# Patient Record
Sex: Female | Born: 1935
Health system: Southern US, Community
[De-identification: ages and names within clinical notes are randomized; demographics above are authoritative.]

## PROBLEM LIST (undated history)

## (undated) DIAGNOSIS — I10 Essential (primary) hypertension: Secondary | ICD-10-CM

## (undated) DIAGNOSIS — E079 Disorder of thyroid, unspecified: Secondary | ICD-10-CM

## (undated) DIAGNOSIS — E785 Hyperlipidemia, unspecified: Secondary | ICD-10-CM

## (undated) HISTORY — DX: Essential (primary) hypertension: I10

## (undated) HISTORY — DX: Disorder of thyroid, unspecified: E07.9

## (undated) HISTORY — PX: NO PAST SURGERIES: SHX2092

## (undated) HISTORY — DX: Hyperlipidemia, unspecified: E78.5

---

## 2014-11-22 ENCOUNTER — Emergency Department: Payer: Self-pay | Admitting: Emergency Medicine

## 2014-11-22 LAB — COMPREHENSIVE METABOLIC PANEL
Albumin: 3.4 g/dL (ref 3.4–5.0)
Alkaline Phosphatase: 71 U/L (ref 46–116)
Anion Gap: 6 — ABNORMAL LOW (ref 7–16)
BUN: 23 mg/dL — AB (ref 7–18)
Bilirubin,Total: 0.5 mg/dL (ref 0.2–1.0)
Calcium, Total: 8.9 mg/dL (ref 8.5–10.1)
Chloride: 106 mmol/L (ref 98–107)
Co2: 29 mmol/L (ref 21–32)
Creatinine: 1.22 mg/dL (ref 0.60–1.30)
EGFR (African American): 53 — ABNORMAL LOW
GFR CALC NON AF AMER: 44 — AB
GLUCOSE: 96 mg/dL (ref 65–99)
Osmolality: 285 (ref 275–301)
POTASSIUM: 4.2 mmol/L (ref 3.5–5.1)
SGOT(AST): 27 U/L (ref 15–37)
SGPT (ALT): 20 U/L (ref 14–63)
Sodium: 141 mmol/L (ref 136–145)
Total Protein: 7 g/dL (ref 6.4–8.2)

## 2014-11-22 LAB — DIFFERENTIAL
BASOS ABS: 0 10*3/uL (ref 0.0–0.1)
Basophil %: 0.9 %
EOS ABS: 0 10*3/uL (ref 0.0–0.7)
Eosinophil %: 0.9 %
LYMPHS PCT: 32 %
Lymphocyte #: 0.7 10*3/uL — ABNORMAL LOW (ref 1.0–3.6)
MONOS PCT: 7.7 %
Monocyte #: 0.2 x10 3/mm (ref 0.2–0.9)
NEUTROS PCT: 58.5 %
Neutrophil #: 1.4 10*3/uL (ref 1.4–6.5)

## 2014-11-22 LAB — URINALYSIS, COMPLETE
BACTERIA: NONE SEEN
Bilirubin,UR: NEGATIVE
GLUCOSE, UR: NEGATIVE mg/dL (ref 0–75)
Ketone: NEGATIVE
LEUKOCYTE ESTERASE: NEGATIVE
Nitrite: NEGATIVE
Ph: 7 (ref 4.5–8.0)
Protein: NEGATIVE
Specific Gravity: 1.008 (ref 1.003–1.030)
WBC UR: 1 /HPF (ref 0–5)

## 2014-11-22 LAB — CBC
HCT: 35.7 % (ref 35.0–47.0)
HGB: 11.4 g/dL — ABNORMAL LOW (ref 12.0–16.0)
MCH: 28.9 pg (ref 26.0–34.0)
MCHC: 31.9 g/dL — ABNORMAL LOW (ref 32.0–36.0)
MCV: 90 fL (ref 80–100)
Platelet: 126 10*3/uL — ABNORMAL LOW (ref 150–440)
RBC: 3.95 10*6/uL (ref 3.80–5.20)
RDW: 12.9 % (ref 11.5–14.5)
WBC: 2.3 10*3/uL — ABNORMAL LOW (ref 3.6–11.0)

## 2014-11-22 LAB — LIPASE, BLOOD: Lipase: 134 U/L (ref 73–393)

## 2015-01-13 DIAGNOSIS — E039 Hypothyroidism, unspecified: Secondary | ICD-10-CM | POA: Diagnosis not present

## 2015-05-18 ENCOUNTER — Ambulatory Visit (INDEPENDENT_AMBULATORY_CARE_PROVIDER_SITE_OTHER): Payer: Medicare PPO | Admitting: Family Medicine

## 2015-05-18 ENCOUNTER — Encounter: Payer: Self-pay | Admitting: Family Medicine

## 2015-05-18 VITALS — BP 138/72 | HR 82 | Temp 97.4°F | Resp 16 | Ht 62.0 in | Wt 129.8 lb

## 2015-05-18 DIAGNOSIS — E785 Hyperlipidemia, unspecified: Secondary | ICD-10-CM

## 2015-05-18 DIAGNOSIS — I10 Essential (primary) hypertension: Secondary | ICD-10-CM

## 2015-05-18 DIAGNOSIS — E038 Other specified hypothyroidism: Secondary | ICD-10-CM

## 2015-05-18 DIAGNOSIS — N189 Chronic kidney disease, unspecified: Secondary | ICD-10-CM

## 2015-05-18 NOTE — Progress Notes (Signed)
Name: Lori Pacheco   MRN: [id number]    DOB: Jul 28, 1921   Date:05/18/2015       Progress Note  Subjective  Chief Complaint  Chief Complaint  Patient presents with  . Hypertension  . Hypothyroidism  . Hyperlipidemia    Hypertension This is a chronic problem. The current episode started more than 1 year ago. The problem is unchanged. The problem is controlled. Pertinent negatives include no blurred vision, chest pain, headaches, neck pain, orthopnea, palpitations or shortness of breath. There are no associated agents to hypertension. Risk factors for coronary artery disease include dyslipidemia and post-menopausal state. Past treatments include ACE inhibitors and calcium channel blockers. The current treatment provides moderate improvement. There are no compliance problems.  Hypertensive end-organ damage includes a thyroid problem.  Hyperlipidemia This is a chronic problem. The current episode started more than 1 year ago. The problem is uncontrolled. Recent lipid tests were reviewed and are high. Exacerbating diseases include hypothyroidism. Factors aggravating her hyperlipidemia include fatty foods. Pertinent negatives include no chest pain, focal weakness, myalgias or shortness of breath. She is currently on no antihyperlipidemic treatment. The current treatment provides no improvement of lipids. Risk factors for coronary artery disease include dyslipidemia, hypertension, post-menopausal, a sedentary lifestyle and stress.  Thyroid Problem Presents for follow-up visit. Patient reports no anxiety, constipation, diarrhea, palpitations, tremors or weight loss. The symptoms have been stable. Past treatments include levothyroxine. The treatment provided significant relief. Her past medical history is significant for hyperlipidemia.      Past Medical History  Diagnosis Date  . Hyperlipidemia   . Hypertension   . Thyroid disease     History  Substance Use Topics  . Smoking status: Never  Smoker   . Smokeless tobacco: Not on file  . Alcohol Use: No     Current outpatient prescriptions:  .  amLODipine (NORVASC) 5 MG tablet, , Disp: , Rfl:  .  levothyroxine (SYNTHROID, LEVOTHROID) 125 MCG tablet, , Disp: , Rfl:  .  lisinopril (PRINIVIL,ZESTRIL) 20 MG tablet, , Disp: , Rfl:   No Known Allergies  Review of Systems  Constitutional: Negative for fever, chills and weight loss.  HENT: Negative for congestion, hearing loss, sore throat and tinnitus.   Eyes: Negative for blurred vision, double vision and redness.  Respiratory: Negative for cough, hemoptysis and shortness of breath.   Cardiovascular: Negative for chest pain, palpitations, orthopnea, claudication and leg swelling.  Gastrointestinal: Negative for heartburn, nausea, vomiting, diarrhea, constipation and blood in stool.  Genitourinary: Negative for dysuria, urgency, frequency and hematuria.  Musculoskeletal: Positive for joint pain. Negative for myalgias, back pain, falls and neck pain.  Skin: Negative for itching.  Neurological: Negative for dizziness, tingling, tremors, focal weakness, seizures, loss of consciousness, weakness and headaches.  Endo/Heme/Allergies: Does not bruise/bleed easily.  Psychiatric/Behavioral: Negative for depression and substance abuse. The patient is not nervous/anxious and does not have insomnia.      Objective  Filed Vitals:   05/18/15 0909  BP: 138/72  Pulse: 82  Temp: 97.4 F (36.3 C)  TempSrc: Oral  Resp: 16  Height: 5\' 2"  (1.575 m)  Weight: 129 lb 12.8 oz (58.877 kg)  SpO2: 97%     Physical Exam  Constitutional: She is oriented to person, place, and time and well-developed, well-nourished, and in no distress.  HENT:  Head: Normocephalic.  Eyes: EOM are normal. Pupils are equal, round, and reactive to light.  Neck: Normal range of motion. No thyromegaly present.  Cardiovascular: Normal rate, regular  rhythm and normal heart sounds.   No murmur  heard. Pulmonary/Chest: Effort normal and breath sounds normal.  Abdominal: Soft. Bowel sounds are normal.  Musculoskeletal: Normal range of motion. She exhibits no edema.  Neurological: She is alert and oriented to person, place, and time. No cranial nerve deficit. Gait normal.  Skin: Skin is warm and dry. No rash noted.  Psychiatric: Memory and affect normal.      Assessment & Plan  1. Essential hypertension Well-controlled  2. Hyperlipemia Needs lipid panel - Lipid panel  3. Other specified hypothyroidism Needs TSH  4. Chronic kidney disease, unspecified stage Recheck status probably secondary to chronic hypertension and NSAID usage - Comprehensive metabolic panel - TSH

## 2015-05-19 DIAGNOSIS — N184 Chronic kidney disease, stage 4 (severe): Secondary | ICD-10-CM | POA: Insufficient documentation

## 2015-05-19 DIAGNOSIS — E785 Hyperlipidemia, unspecified: Secondary | ICD-10-CM | POA: Insufficient documentation

## 2015-05-19 DIAGNOSIS — E038 Other specified hypothyroidism: Secondary | ICD-10-CM | POA: Insufficient documentation

## 2015-05-19 DIAGNOSIS — I10 Essential (primary) hypertension: Secondary | ICD-10-CM | POA: Insufficient documentation

## 2015-05-19 LAB — COMPREHENSIVE METABOLIC PANEL
A/G RATIO: 1.7 (ref 1.1–2.5)
ALT: 7 IU/L (ref 0–32)
AST: 20 IU/L (ref 0–40)
Albumin: 4.3 g/dL (ref 3.2–4.6)
Alkaline Phosphatase: 76 IU/L (ref 39–117)
BILIRUBIN TOTAL: 0.4 mg/dL (ref 0.0–1.2)
BUN/Creatinine Ratio: 14 (ref 11–26)
BUN: 19 mg/dL (ref 10–36)
CHLORIDE: 102 mmol/L (ref 97–108)
CO2: 24 mmol/L (ref 18–29)
Calcium: 9.3 mg/dL (ref 8.7–10.3)
Creatinine, Ser: 1.37 mg/dL — ABNORMAL HIGH (ref 0.57–1.00)
GFR calc non Af Amer: 34 mL/min/{1.73_m2} — ABNORMAL LOW (ref >59)
GFR, EST AFRICAN AMERICAN: 39 mL/min/{1.73_m2} — AB (ref >59)
Globulin, Total: 2.6 g/dL (ref 1.5–4.5)
Glucose: 110 mg/dL — ABNORMAL HIGH (ref 65–99)
Potassium: 4.6 mmol/L (ref 3.5–5.2)
Sodium: 141 mmol/L (ref 134–144)
Total Protein: 6.9 g/dL (ref 6.0–8.5)

## 2015-05-19 LAB — LIPID PANEL
CHOLESTEROL TOTAL: 259 mg/dL — AB (ref 100–199)
Chol/HDL Ratio: 3.2 ratio units (ref 0.0–4.4)
HDL: 81 mg/dL (ref >39)
LDL CALC: 161 mg/dL — AB (ref 0–99)
Triglycerides: 86 mg/dL (ref 0–149)
VLDL Cholesterol Cal: 17 mg/dL (ref 5–40)

## 2015-05-19 LAB — TSH: TSH: 27.08 u[IU]/mL — AB (ref 0.450–4.500)

## 2015-05-19 NOTE — Patient Instructions (Signed)
Follow up in 4 months 

## 2015-05-25 NOTE — Progress Notes (Signed)
Spoke to daughter [name] to check her mothers medication and called in back

## 2015-05-26 ENCOUNTER — Telehealth: Payer: Self-pay | Admitting: Family Medicine

## 2015-05-26 ENCOUNTER — Telehealth: Payer: Self-pay | Admitting: Emergency Medicine

## 2015-05-26 DIAGNOSIS — E0590 Thyrotoxicosis, unspecified without thyrotoxic crisis or storm: Secondary | ICD-10-CM

## 2015-05-26 NOTE — Telephone Encounter (Signed)
Patient daughter [name] called back. Patient was taking 125 mcg of Synthroid. Per Dr. Rutherford Nail patient is to increase to 150 mcg of Synthroid. [name] notified. Script called to pharmacy

## 2015-05-26 NOTE — Telephone Encounter (Signed)
Patient was returning your call. Stated that you wanted to know the dosage of Levothyroxin due to elevated thyroids. She is currently taking 125mg .

## 2015-06-15 MED ORDER — LEVOTHYROXINE SODIUM 150 MCG PO TABS: 150.0000 ug | ORAL_TABLET | Freq: Every day | ORAL | Status: DC

## 2015-07-12 ENCOUNTER — Emergency Department: Payer: Medicare PPO

## 2015-07-12 ENCOUNTER — Emergency Department
Admission: EM | Admit: 2015-07-12 | Discharge: 2015-07-12 | Disposition: A | Discharge status: Home / Self Care | Payer: Medicare PPO | Attending: Emergency Medicine | Admitting: Emergency Medicine

## 2015-07-12 ENCOUNTER — Other Ambulatory Visit: Payer: Self-pay

## 2015-07-12 ENCOUNTER — Encounter: Payer: Self-pay | Admitting: Emergency Medicine

## 2015-07-12 DIAGNOSIS — R110 Nausea: Secondary | ICD-10-CM | POA: Diagnosis not present

## 2015-07-12 DIAGNOSIS — M25562 Pain in left knee: Secondary | ICD-10-CM | POA: Insufficient documentation

## 2015-07-12 DIAGNOSIS — M25561 Pain in right knee: Secondary | ICD-10-CM | POA: Insufficient documentation

## 2015-07-12 DIAGNOSIS — I10 Essential (primary) hypertension: Secondary | ICD-10-CM | POA: Insufficient documentation

## 2015-07-12 DIAGNOSIS — R109 Unspecified abdominal pain: Secondary | ICD-10-CM | POA: Diagnosis not present

## 2015-07-12 DIAGNOSIS — K5900 Constipation, unspecified: Secondary | ICD-10-CM | POA: Insufficient documentation

## 2015-07-12 DIAGNOSIS — R1011 Right upper quadrant pain: Secondary | ICD-10-CM | POA: Insufficient documentation

## 2015-07-12 DIAGNOSIS — Z79899 Other long term (current) drug therapy: Secondary | ICD-10-CM | POA: Diagnosis not present

## 2015-07-12 DIAGNOSIS — R61 Generalized hyperhidrosis: Secondary | ICD-10-CM | POA: Diagnosis not present

## 2015-07-12 LAB — URINALYSIS COMPLETE WITH MICROSCOPIC (ARMC ONLY)
BILIRUBIN URINE: NEGATIVE
Bacteria, UA: NONE SEEN
GLUCOSE, UA: NEGATIVE mg/dL
LEUKOCYTES UA: NEGATIVE
Nitrite: NEGATIVE
PH: 5 (ref 5.0–8.0)
Protein, ur: NEGATIVE mg/dL
SPECIFIC GRAVITY, URINE: 1.017 (ref 1.005–1.030)

## 2015-07-12 LAB — COMPREHENSIVE METABOLIC PANEL
ALT: 12 U/L — ABNORMAL LOW (ref 14–54)
ANION GAP: 9 (ref 5–15)
AST: 20 U/L (ref 15–41)
Albumin: 4.1 g/dL (ref 3.5–5.0)
Alkaline Phosphatase: 65 U/L (ref 38–126)
BUN: 31 mg/dL — ABNORMAL HIGH (ref 6–20)
CHLORIDE: 104 mmol/L (ref 101–111)
CO2: 26 mmol/L (ref 22–32)
CREATININE: 1.41 mg/dL — AB (ref 0.44–1.00)
Calcium: 9.6 mg/dL (ref 8.9–10.3)
GFR, EST AFRICAN AMERICAN: 36 mL/min — AB (ref >60)
GFR, EST NON AFRICAN AMERICAN: 31 mL/min — AB (ref >60)
Glucose, Bld: 105 mg/dL — ABNORMAL HIGH (ref 65–99)
Potassium: 4.7 mmol/L (ref 3.5–5.1)
SODIUM: 139 mmol/L (ref 135–145)
Total Bilirubin: 0.8 mg/dL (ref 0.3–1.2)
Total Protein: 7.5 g/dL (ref 6.5–8.1)

## 2015-07-12 LAB — LIPASE, BLOOD: LIPASE: 34 U/L (ref 22–51)

## 2015-07-12 LAB — CBC
HCT: 35.6 % (ref 35.0–47.0)
HEMOGLOBIN: 11.6 g/dL — AB (ref 12.0–16.0)
MCH: 28.9 pg (ref 26.0–34.0)
MCHC: 32.5 g/dL (ref 32.0–36.0)
MCV: 89 fL (ref 80.0–100.0)
PLATELETS: 116 10*3/uL — AB (ref 150–440)
RBC: 4 MIL/uL (ref 3.80–5.20)
RDW: 13.5 % (ref 11.5–14.5)
WBC: 4.1 10*3/uL (ref 3.6–11.0)

## 2015-07-12 MED ORDER — IOHEXOL 240 MG/ML SOLN
25.0000 mL | Freq: Once | INTRAMUSCULAR | Status: AC | PRN
  Administered 2015-07-12: 25 mL via ORAL

## 2015-07-12 MED ORDER — IOHEXOL 300 MG/ML  SOLN
75.0000 mL | Freq: Once | INTRAMUSCULAR | Status: AC | PRN
  Administered 2015-07-12: 75 mL via INTRAVENOUS

## 2015-07-12 NOTE — ED Notes (Signed)
Pt presents to the ER from home with complaints of RUQ pain for about a week, pt reports pain gets better when she is in a sitting position. Pt reports some nausea "I feel like I am going to vomit but I have not " pt denies any diarrhea pt talks in complete sentences, no respiratory distress noted.

## 2015-07-12 NOTE — ED Notes (Signed)
Patient transported to CT 

## 2015-07-12 NOTE — ED Notes (Signed)
Patient transported to Ultrasound 

## 2015-07-12 NOTE — ED Notes (Signed)
Patient unable to urinate at this time. Informed her we need a urine sample.

## 2015-07-12 NOTE — ED Provider Notes (Signed)
[facility] Emergency Department Provider Note   ____________________________________________  Time seen: 5:45 PM I have reviewed the triage vital signs and the triage nursing note.  HISTORY  Chief Complaint Abdominal Pain   Historian Patient and her daughter  HPI Lori Pacheco is a 81 y.o. female who is fairly healthy and independent and lives at home, who has reportedly been having right-sided abdominal pain for approximately 3 weeks. Daughter states she has noticed her mom complaining of this pain for about 1 month, but her mother has not wanted to come to the doctor for evaluation. Patient states that she has had some nausea without vomiting. She states she is always had constipation even when she was little girl. It's unclear whether the pain is associated with eating or not. The pain is intermittent and she cannot really describe it as sharp or dull and just calls it "pain. "Pain is currently mild. At times it is moderate.    Past Medical History  Diagnosis Date  . Hyperlipidemia   . Hypertension   . Thyroid disease     Patient Active Problem List   Diagnosis Date Noted  . Essential hypertension 05/19/2015  . Hyperlipemia 05/19/2015  . Other specified hypothyroidism 05/19/2015  . Chronic kidney disease 05/19/2015    Past Surgical History  Procedure Laterality Date  . No past surgeries      Current Outpatient Rx  Name  Route  Sig  Dispense  Refill  . amLODipine (NORVASC) 5 MG tablet               . levothyroxine (SYNTHROID, LEVOTHROID) 150 MCG tablet   Oral   Take 1 tablet (150 mcg total) by mouth daily.   30 tablet   5   . levothyroxine (SYNTHROID, LEVOTHROID) 150 MCG tablet   Oral   Take 150 mcg by mouth daily before breakfast.         . lisinopril (PRINIVIL,ZESTRIL) 20 MG tablet                 Allergies Review of patient's allergies indicates no known allergies.  No family history on file.  Social  History Social History  Substance Use Topics  . Smoking status: Never Smoker   . Smokeless tobacco: None  . Alcohol Use: No    Review of Systems  Constitutional: Negative for fever. Positive for night sweats. Positive for large weight loss over the past several months, unintentional. Eyes: Negative for visual changes. ENT: Negative for sore throat. Cardiovascular: Negative for chest pain. Respiratory: Negative for shortness of breath. Gastrointestinal: Negative for diarrhea. Genitourinary: Negative for dysuria. Musculoskeletal: Negative for back pain. Occasional bilateral knee pains. Skin: Negative for rash. Neurological: Negative for headache. 10 point Review of Systems otherwise negative ____________________________________________   PHYSICAL EXAM:  VITAL SIGNS: ED Triage Vitals  Enc Vitals Group     BP --      Pulse Rate 07/12/15 1527 88     Resp 07/12/15 1527 20     Temp 07/12/15 1527 98.4 F (36.9 C)     Temp Source 07/12/15 1527 Oral     SpO2 07/12/15 1527 100 %     Weight 07/12/15 1527 130 lb (58.968 kg)     Height 07/12/15 1527 5\' 1"  (1.549 m)     Head Cir --      Peak Flow --      Pain Score 07/12/15 1529 4     Pain Loc --  Pain Edu? --      Excl. in [place]? --      Constitutional: Alert and oriented. Well appearing and in no distress. Eyes: Conjunctivae are normal. PERRL. Normal extraocular movements. ENT   Head: Normocephalic and atraumatic.   Nose: No congestion/rhinnorhea.   Mouth/Throat: Mucous membranes are moist. No teeth.   Neck: No stridor. Cardiovascular/Chest: Normal rate, regular rhythm.  No murmurs, rubs, or gallops. Respiratory: Normal respiratory effort without tachypnea nor retractions. Breath sounds are clear and equal bilaterally. No wheezes/rales/rhonchi. Gastrointestinal: Soft. No distention, no guarding, no rebound. No palpable masses. Mild right-sided abdominal tenderness to palpation deeply in the right upper  quadrant.  Genitourinary/rectal:Deferred Musculoskeletal: Nontender with normal range of motion in all extremities. No joint effusions.  No lower extremity tenderness.  No edema. Neurologic:  Normal speech and language. No gross or focal neurologic deficits are appreciated. Skin:  Skin is warm, dry and intact. No rash noted. Psychiatric: Mood and affect are normal. Speech and behavior are normal. Patient exhibits appropriate insight and judgment.  ____________________________________________   EKG I, Lisa Roca, MD, the attending physician have personally viewed and interpreted all ECGs.  86 bpm. Sinus rhythm with first-degree AV block. Left axis deviation. LVH. Nonspecific T wave. ____________________________________________  LABS (pertinent positives/negatives)  Lipase 34 Comprehensive metabolic panel significant for BUN 31 and creatinine 1.41 and LFTs within normal limits. White blood count 4.1, hemoglobin 1.6, platelet count 116 ____________________________________________  RADIOLOGY All Xrays were viewed by me. Imaging interpreted by Radiologist.  Ultrasound right upper quadrant:  Negative for gallstones. Negative exam.  CT abdomen and pelvis with contrast:  IMPRESSION: 1. No acute abnormality in the abdomen/pelvis. 2. Incidental findings of atherosclerosis, small hepatic and right renal cysts.  __________________________________________  PROCEDURES  Procedure(s) performed: None  Critical Care performed: None  ____________________________________________   ED COURSE / ASSESSMENT AND PLAN  CONSULTATIONS: None  Pertinent labs & imaging results that were available during my care of the patient were reviewed by me and considered in my medical decision making (see chart for details).   Although the patient's laboratory evaluation is reassuring, she does have some tenderness on the right side, and I'm more concerned given that she's had significant weight loss  and complaining of night sweats as well. We proceeded with ultrasound and this was negative. We then proceeded with CT scan, and this showed no acute abnormalities. Patient will follow up with her primary care physician.  Patient / Family / Caregiver informed of clinical course, medical decision-making process, and agree with plan.   I discussed return precautions, follow-up instructions, and discharged instructions with patient and/or family.  ___________________________________________   FINAL CLINICAL IMPRESSION(S) / ED DIAGNOSES   Final diagnoses:  RUQ pain       Lisa Roca, MD 07/12/15 2050

## 2015-07-12 NOTE — Discharge Instructions (Signed)
You were evaluated for intermittent right-sided abdominal pain, and no certain cause was found but your exam and evaluation are reassuring. Return to the emergency department for any worsening condition including abdominal pain, vomiting, diarrhea, black or bloody stools, or any other symptoms concerning to you.   Abdominal Pain Many things can cause abdominal pain. Usually, abdominal pain is not caused by a disease and will improve without treatment. It can often be observed and treated at home. Your health care provider will do a physical exam and possibly order blood tests and X-rays to help determine the seriousness of your pain. However, in many cases, more time must pass before a clear cause of the pain can be found. Before that point, your health care provider may not know if you need more testing or further treatment. HOME CARE INSTRUCTIONS  Monitor your abdominal pain for any changes. The following actions may help to alleviate any discomfort you are experiencing:  Only take over-the-counter or prescription medicines as directed by your health care provider.  Do not take laxatives unless directed to do so by your health care provider.  Try a clear liquid diet (broth, tea, or water) as directed by your health care provider. Slowly move to a bland diet as tolerated. SEEK MEDICAL CARE IF:  You have unexplained abdominal pain.  You have abdominal pain associated with nausea or diarrhea.  You have pain when you urinate or have a bowel movement.  You experience abdominal pain that wakes you in the night.  You have abdominal pain that is worsened or improved by eating food.  You have abdominal pain that is worsened with eating fatty foods.  You have a fever. SEEK IMMEDIATE MEDICAL CARE IF:   Your pain does not go away within 2 hours.  You keep throwing up (vomiting).  Your pain is felt only in portions of the abdomen, such as the right side or the left lower portion of the  abdomen.  You pass bloody or black tarry stools. MAKE SURE YOU:  Understand these instructions.   Will watch your condition.   Will get help right away if you are not doing well or get worse.  Document Released: 07/13/2005 Document Revised: 10/08/2013 Document Reviewed: 06/12/2013 [facility] Patient Information 2015 [place], [place]. This information is not intended to replace advice given to you by your health care provider. Make sure you discuss any questions you have with your health care provider.

## 2015-09-17 ENCOUNTER — Ambulatory Visit (INDEPENDENT_AMBULATORY_CARE_PROVIDER_SITE_OTHER): Payer: Medicare PPO | Admitting: Family Medicine

## 2015-09-17 ENCOUNTER — Encounter: Payer: Self-pay | Admitting: Family Medicine

## 2015-09-17 VITALS — BP 146/72 | HR 89 | Temp 98.7°F | Resp 16 | Ht 61.0 in | Wt 125.1 lb

## 2015-09-17 DIAGNOSIS — I10 Essential (primary) hypertension: Secondary | ICD-10-CM

## 2015-09-17 DIAGNOSIS — E039 Hypothyroidism, unspecified: Secondary | ICD-10-CM | POA: Diagnosis not present

## 2015-09-17 DIAGNOSIS — E038 Other specified hypothyroidism: Secondary | ICD-10-CM

## 2015-09-17 DIAGNOSIS — Z23 Encounter for immunization: Secondary | ICD-10-CM | POA: Diagnosis not present

## 2015-09-17 DIAGNOSIS — N189 Chronic kidney disease, unspecified: Secondary | ICD-10-CM | POA: Diagnosis not present

## 2015-09-17 DIAGNOSIS — E0590 Thyrotoxicosis, unspecified without thyrotoxic crisis or storm: Secondary | ICD-10-CM | POA: Diagnosis not present

## 2015-09-18 DIAGNOSIS — E039 Hypothyroidism, unspecified: Secondary | ICD-10-CM | POA: Insufficient documentation

## 2015-09-18 LAB — BASIC METABOLIC PANEL
BUN/Creatinine Ratio: 20 (ref 11–26)
BUN: 22 mg/dL (ref 10–36)
CO2: 24 mmol/L (ref 18–29)
CREATININE: 1.12 mg/dL — AB (ref 0.57–1.00)
Calcium: 9.6 mg/dL (ref 8.7–10.3)
Chloride: 102 mmol/L (ref 97–106)
GFR calc Af Amer: 49 mL/min/{1.73_m2} — ABNORMAL LOW (ref >59)
GFR, EST NON AFRICAN AMERICAN: 43 mL/min/{1.73_m2} — AB (ref >59)
Glucose: 98 mg/dL (ref 65–99)
Potassium: 4.7 mmol/L (ref 3.5–5.2)
SODIUM: 140 mmol/L (ref 136–144)

## 2015-09-18 LAB — TSH: TSH: <0.006 u[IU]/mL — ABNORMAL LOW (ref 0.450–4.500)

## 2015-09-18 NOTE — Progress Notes (Signed)
Name: Lori Pacheco   MRN: [id number]    DOB: 1921/02/28   Date:09/18/2015       Progress Note  Subjective  Chief Complaint  Chief Complaint  Patient presents with  . Hyperlipidemia  . Chronic Kidney Disease  . Hypertension    HPI  Hypertension   Patient presents for follow-up of hypertension. It has been present for over 5 years.  Patient states that there is compliance with medical regimen which consists of lisinopril 20 mg daily and amlodipine 5 mg daily . There is no end organ disease. Cardiac risk factors include hypertension hyperlipidemia and diabetes.  Exercise regimen consist of some walking .  Diet consist of salt restriction  Hyperlipidemia  Patient has a history of hyperlipidemia for over 5 years.  Current medical regimen consist of diet and exercise .  Compliance is intermittent .  Diet and exercise are currently followed intermittently .  Risk factors for cardiovascular disease include hyperlipidemia hypertension .   There have been no side effects from the medication.    Patient presents for follow-up of hypothyroidism  . It has been present for over 5 years years.  Current symptoms consist of levothyroxin 150 g daily . Current medication regimen consist of levothyroxin 150 micrograms daily .   There is good compliance with regimen.   .  Past Medical History  Diagnosis Date  . Hyperlipidemia   . Hypertension   . Thyroid disease     Social History  Substance Use Topics  . Smoking status: Never Smoker   . Smokeless tobacco: Not on file  . Alcohol Use: No     Current outpatient prescriptions:  .  amLODipine (NORVASC) 5 MG tablet, Take 5 mg by mouth daily. , Disp: , Rfl:  .  levothyroxine (SYNTHROID, LEVOTHROID) 150 MCG tablet, Take 1 tablet (150 mcg total) by mouth daily., Disp: 30 tablet, Rfl: 5 .  lisinopril (PRINIVIL,ZESTRIL) 20 MG tablet, Take 20 mg by mouth daily. , Disp: , Rfl:   No Known Allergies  Review of Systems  Constitutional: Negative for  fever, chills and weight loss.  HENT: Negative for congestion, hearing loss, sore throat and tinnitus.   Eyes: Negative for blurred vision, double vision and redness.  Respiratory: Negative for cough, hemoptysis and shortness of breath.   Cardiovascular: Negative for chest pain, palpitations, orthopnea, claudication and leg swelling.  Gastrointestinal: Negative for heartburn, nausea, vomiting, diarrhea, constipation and blood in stool.  Genitourinary: Negative for dysuria, urgency, frequency and hematuria.  Musculoskeletal: Positive for joint pain. Negative for myalgias, back pain, falls and neck pain.  Skin: Negative for itching.  Neurological: Negative for dizziness, tingling, tremors, focal weakness, seizures, loss of consciousness, weakness and headaches.  Endo/Heme/Allergies: Does not bruise/bleed easily.  Psychiatric/Behavioral: Negative for depression and substance abuse. The patient is not nervous/anxious and does not have insomnia.      Objective  Filed Vitals:   09/17/15 0944  BP: 146/72  Pulse: 89  Temp: 98.7 F (37.1 C)  Resp: 16  Height: '5\' 1"'  (1.549 m)  Weight: 125 lb 2 oz (56.756 kg)  SpO2: 99%     Physical Exam  Constitutional: She is oriented to person, place, and time and well-developed, well-nourished, and in no distress.  HENT:  Head: Normocephalic.  Eyes: EOM are normal. Pupils are equal, round, and reactive to light.  Neck: Normal range of motion. No thyromegaly present.  Cardiovascular: Normal rate, regular rhythm and normal heart sounds.   No murmur heard. Pulmonary/Chest: Effort  normal and breath sounds normal.  Abdominal: Soft. Bowel sounds are normal.  Musculoskeletal: Normal range of motion. She exhibits no edema.  Neurological: She is alert and oriented to person, place, and time. No cranial nerve deficit. Gait normal.  Skin: Skin is warm and dry. No rash noted.  Psychiatric: Memory and affect normal.      Assessment & Plan   1. Need for  influenza vaccination Given - Flu vaccine HIGH DOSE PF (Fluzone High dose)  2. CKD (chronic kidney disease), unspecified stage Check met the - Basic Metabolic Panel (BMET)  3. Other specified hypothyroidism Check TSH  4. Hypothyroidism, unspecified hypothyroidism type  - TSH  5.-hypertension  Well-controlled

## 2016-01-18 ENCOUNTER — Ambulatory Visit: Payer: Medicare PPO | Admitting: Family Medicine

## 2016-01-19 ENCOUNTER — Telehealth: Payer: Self-pay | Admitting: Family Medicine

## 2016-01-19 MED ORDER — AMLODIPINE BESYLATE 5 MG PO TABS: 5.0000 mg | ORAL_TABLET | Freq: Every day | ORAL | Status: DC

## 2016-01-19 MED ORDER — LISINOPRIL 20 MG PO TABS: 20.0000 mg | ORAL_TABLET | Freq: Every day | ORAL | Status: DC

## 2016-01-19 NOTE — Telephone Encounter (Signed)
Requesting refill on all medications, please send to [facility]. She does not need refill on her thyroid medications. Please call once done. Thank you

## 2016-01-19 NOTE — Telephone Encounter (Signed)
Refills have been sent to pharmacy and spoke with daughter and informed her that they had been sent.

## 2016-01-20 ENCOUNTER — Other Ambulatory Visit: Payer: Self-pay

## 2016-01-20 MED ORDER — AMLODIPINE BESYLATE 5 MG PO TABS: 5.0000 mg | ORAL_TABLET | Freq: Every day | ORAL | Status: DC

## 2016-01-20 NOTE — Telephone Encounter (Signed)
Last note reviewed; Rx approved (covering for primary)

## 2016-03-28 ENCOUNTER — Encounter: Payer: Self-pay | Admitting: Family Medicine

## 2016-03-28 ENCOUNTER — Ambulatory Visit (INDEPENDENT_AMBULATORY_CARE_PROVIDER_SITE_OTHER): Payer: Medicare Other | Admitting: Family Medicine

## 2016-03-28 VITALS — BP 138/80 | HR 84 | Temp 98.1°F | Resp 18 | Ht 61.0 in | Wt 125.1 lb

## 2016-03-28 DIAGNOSIS — E785 Hyperlipidemia, unspecified: Secondary | ICD-10-CM

## 2016-03-28 DIAGNOSIS — N183 Chronic kidney disease, stage 3 unspecified: Secondary | ICD-10-CM

## 2016-03-28 DIAGNOSIS — I10 Essential (primary) hypertension: Secondary | ICD-10-CM

## 2016-03-28 DIAGNOSIS — E559 Vitamin D deficiency, unspecified: Secondary | ICD-10-CM

## 2016-03-28 DIAGNOSIS — E039 Hypothyroidism, unspecified: Secondary | ICD-10-CM | POA: Diagnosis not present

## 2016-03-29 DIAGNOSIS — E559 Vitamin D deficiency, unspecified: Secondary | ICD-10-CM | POA: Insufficient documentation

## 2016-03-29 LAB — BASIC METABOLIC PANEL
BUN/Creatinine Ratio: 21 (ref 12–28)
BUN: 26 mg/dL (ref 10–36)
CHLORIDE: 101 mmol/L (ref 96–106)
CO2: 25 mmol/L (ref 18–29)
Calcium: 9.6 mg/dL (ref 8.7–10.3)
Creatinine, Ser: 1.23 mg/dL — ABNORMAL HIGH (ref 0.57–1.00)
GFR calc Af Amer: 44 mL/min/{1.73_m2} — ABNORMAL LOW (ref >59)
GFR, EST NON AFRICAN AMERICAN: 38 mL/min/{1.73_m2} — AB (ref >59)
Glucose: 102 mg/dL — ABNORMAL HIGH (ref 65–99)
Potassium: 5.2 mmol/L (ref 3.5–5.2)
Sodium: 140 mmol/L (ref 134–144)

## 2016-03-29 LAB — TSH: TSH: 4.45 u[IU]/mL (ref 0.450–4.500)

## 2016-03-29 LAB — VITAMIN D 25 HYDROXY (VIT D DEFICIENCY, FRACTURES): VIT D 25 HYDROXY: 12.9 ng/mL — AB (ref 30.0–100.0)

## 2016-03-29 MED ORDER — VITAMIN D 50 MCG (2000 UT) PO CAPS: 1.0000 | ORAL_CAPSULE | Freq: Every day | ORAL | Status: DC

## 2016-03-29 NOTE — Progress Notes (Signed)
Date:  03/28/2016   Name:  Lori Pacheco   DOB:  05/12/1921   MRN:  [id number]  PCP:  Lemont Morrisey, MD    Chief Complaint: Medication Refill   History of Present Illness:  This is a 82 y.o. female seen for 6 month f/u. HTN well controlled on lisinopril/amlodipine, hypothyroid on Synthroid, last TSH low, Synthroid dose lowered. eGFR 49 in December, pt not aware of any kidney problems.  Review of Systems:  Review of Systems  Constitutional: Negative for fever and fatigue.  Respiratory: Negative for cough and shortness of breath.   Cardiovascular: Negative for chest pain and leg swelling.  Endocrine: Negative for polyuria.  Genitourinary: Negative for difficulty urinating.  Neurological: Negative for syncope and light-headedness.    Patient Active Problem List   Diagnosis Date Noted  . CKD (chronic kidney disease), stage III 09/18/2015  . Hypothyroidism 09/18/2015  . Essential hypertension 05/19/2015  . Hyperlipemia 05/19/2015    Prior to Admission medications   Medication Sig Start Date End Date Taking? Authorizing Provider  amLODipine (NORVASC) 5 MG tablet Take 1 tablet (5 mg total) by mouth daily. 01/20/16   Melinda P Lada, MD  levothyroxine (SYNTHROID, LEVOTHROID) 150 MCG tablet Take 1 tablet (150 mcg total) by mouth daily. 06/15/15   Lemont Morrisey, MD  lisinopril (PRINIVIL,ZESTRIL) 20 MG tablet Take 1 tablet (20 mg total) by mouth daily. 01/19/16   Lemont Morrisey, MD    No Known Allergies  Past Surgical History  Procedure Laterality Date  . No past surgeries      Social History  Substance Use Topics  . Smoking status: Never Smoker   . Smokeless tobacco: None  . Alcohol Use: No    No family history on file.  Medication list has been reviewed and updated.  Physical Examination: BP 138/80 mmHg  Pulse 84  Temp(Src) 98.1 F (36.7 C)  Resp 18  Ht 5' 1" (1.549 m)  Wt 125 lb 2 oz (56.756 kg)  BMI 23.65 kg/m2  SpO2 97%  Physical Exam  Constitutional: She  appears well-developed and well-nourished.  Cardiovascular: Normal rate, regular rhythm and normal heart sounds.   Pulmonary/Chest: Effort normal and breath sounds normal.  Musculoskeletal: She exhibits no edema.  Neurological: She is alert.  Skin: Skin is warm and dry.  Psychiatric: She has a normal mood and affect. Her behavior is normal.  Nursing note and vitals reviewed.   Assessment and Plan:  1. Essential hypertension Adequate control, continue lisinopril/amlodipine - Basic Metabolic Panel (BMET)  2. Hypothyroidism, unspecified hypothyroidism type Recheck TSH on decreased Synthroid dose - TSH  3. CKD (chronic kidney disease), stage III Avoid NSAIDS - Vitamin D (25 hydroxy)  4. HLD Would not rx given age and lack of CVD  Return in about 6 months (around 09/27/2016).   M. , Jr. MD [facility]  03/29/2016  

## 2016-03-29 NOTE — Addendum Note (Signed)
Addended by: Adline Potter on: 03/29/2016 03:49 PM   Modules accepted: Orders

## 2016-04-16 ENCOUNTER — Encounter: Payer: Self-pay | Admitting: Emergency Medicine

## 2016-04-16 ENCOUNTER — Emergency Department
Admission: EM | Admit: 2016-04-16 | Discharge: 2016-04-16 | Disposition: A | Discharge status: Home / Self Care | Payer: Medicare Other | Attending: Emergency Medicine | Admitting: Emergency Medicine

## 2016-04-16 ENCOUNTER — Emergency Department: Payer: Medicare Other

## 2016-04-16 DIAGNOSIS — N183 Chronic kidney disease, stage 3 (moderate): Secondary | ICD-10-CM | POA: Insufficient documentation

## 2016-04-16 DIAGNOSIS — E039 Hypothyroidism, unspecified: Secondary | ICD-10-CM | POA: Diagnosis not present

## 2016-04-16 DIAGNOSIS — R1084 Generalized abdominal pain: Secondary | ICD-10-CM

## 2016-04-16 DIAGNOSIS — I129 Hypertensive chronic kidney disease with stage 1 through stage 4 chronic kidney disease, or unspecified chronic kidney disease: Secondary | ICD-10-CM | POA: Insufficient documentation

## 2016-04-16 DIAGNOSIS — E785 Hyperlipidemia, unspecified: Secondary | ICD-10-CM | POA: Insufficient documentation

## 2016-04-16 DIAGNOSIS — Z79899 Other long term (current) drug therapy: Secondary | ICD-10-CM | POA: Diagnosis not present

## 2016-04-16 LAB — COMPREHENSIVE METABOLIC PANEL
ALK PHOS: 65 U/L (ref 38–126)
ALT: 12 U/L — AB (ref 14–54)
AST: 20 U/L (ref 15–41)
Albumin: 4.1 g/dL (ref 3.5–5.0)
Anion gap: 6 (ref 5–15)
BILIRUBIN TOTAL: 0.9 mg/dL (ref 0.3–1.2)
BUN: 21 mg/dL — AB (ref 6–20)
CALCIUM: 9.3 mg/dL (ref 8.9–10.3)
CHLORIDE: 106 mmol/L (ref 101–111)
CO2: 28 mmol/L (ref 22–32)
CREATININE: 1.17 mg/dL — AB (ref 0.44–1.00)
GFR, EST AFRICAN AMERICAN: 45 mL/min — AB (ref >60)
GFR, EST NON AFRICAN AMERICAN: 39 mL/min — AB (ref >60)
Glucose, Bld: 97 mg/dL (ref 65–99)
Potassium: 4.3 mmol/L (ref 3.5–5.1)
Sodium: 140 mmol/L (ref 135–145)
Total Protein: 7.4 g/dL (ref 6.5–8.1)

## 2016-04-16 LAB — URINALYSIS COMPLETE WITH MICROSCOPIC (ARMC ONLY)
Bilirubin Urine: NEGATIVE
Glucose, UA: NEGATIVE mg/dL
Ketones, ur: NEGATIVE mg/dL
Nitrite: NEGATIVE
PH: 6 (ref 5.0–8.0)
PROTEIN: NEGATIVE mg/dL
Specific Gravity, Urine: 1.018 (ref 1.005–1.030)

## 2016-04-16 LAB — CBC
HEMATOCRIT: 35.5 % (ref 35.0–47.0)
HEMOGLOBIN: 11.7 g/dL — AB (ref 12.0–16.0)
MCH: 29.8 pg (ref 26.0–34.0)
MCHC: 33 g/dL (ref 32.0–36.0)
MCV: 90.3 fL (ref 80.0–100.0)
Platelets: 138 10*3/uL — ABNORMAL LOW (ref 150–440)
RBC: 3.93 MIL/uL (ref 3.80–5.20)
RDW: 14.3 % (ref 11.5–14.5)
WBC: 2.8 10*3/uL — AB (ref 3.6–11.0)

## 2016-04-16 LAB — LIPASE, BLOOD: LIPASE: 33 U/L (ref 11–51)

## 2016-04-16 LAB — TROPONIN I: Troponin I: <0.03 ng/mL (ref <0.03)

## 2016-04-16 MED ORDER — IOPAMIDOL (ISOVUE-300) INJECTION 61%
75.0000 mL | Freq: Once | INTRAVENOUS | Status: AC | PRN
  Administered 2016-04-16: 75 mL via INTRAVENOUS

## 2016-04-16 MED ORDER — DIATRIZOATE MEGLUMINE & SODIUM 66-10 % PO SOLN
15.0000 mL | Freq: Once | ORAL | Status: AC
  Administered 2016-04-16: 15 mL via ORAL

## 2016-04-16 NOTE — ED Notes (Signed)
CT at bedside to give patient CT contrast to drink. Pt denies any needs and is noted to be in NAD at this time. Will continue to monitor for further patient needs. Pt's family remains at bedside at this time.

## 2016-04-16 NOTE — ED Provider Notes (Signed)
[facility] Emergency Department Provider Note   ____________________________________________  Time seen: Approximately 9:45am I have reviewed the triage vital signs and the triage nursing note.  HISTORY  Chief Complaint Abdominal Pain   Historian Patient and daughters  HPI Lori Pacheco is a 82 y.o. female is here for eval for abdominal discomfort overnight.  She does have this intermittently and it is located usually on the right side of the abdomen.  No fevers, vomiting or diarrhea.  No chest pain or trouble breathing. Currently patient feels much better, possibly some mild nausea or discomfort. Patient seems to notice the discomfort usually at nighttime. No burning in the epigastrium or belching.      Past Medical History  Diagnosis Date  . Hyperlipidemia   . Hypertension   . Thyroid disease     Patient Active Problem List   Diagnosis Date Noted  . Vitamin D deficiency 03/29/2016  . CKD (chronic kidney disease), stage III 09/18/2015  . Hypothyroidism 09/18/2015  . Essential hypertension 05/19/2015  . Hyperlipemia 05/19/2015    Past Surgical History  Procedure Laterality Date  . No past surgeries      Current Outpatient Rx  Name  Route  Sig  Dispense  Refill  . amLODipine (NORVASC) 5 MG tablet   Oral   Take 1 tablet (5 mg total) by mouth daily.   90 tablet   0   . levothyroxine (SYNTHROID, LEVOTHROID) 150 MCG tablet   Oral   Take 1 tablet (150 mcg total) by mouth daily.   30 tablet   5   . lisinopril (PRINIVIL,ZESTRIL) 20 MG tablet   Oral   Take 1 tablet (20 mg total) by mouth daily.   90 tablet   0   . Cholecalciferol (VITAMIN D) 2000 units CAPS   Oral   Take 1 capsule (2,000 Units total) by mouth daily.   30 capsule        Allergies Review of patient's allergies indicates no known allergies.  No family history on file.  Social History Social History  Substance Use Topics  . Smoking status: Never Smoker   .  Smokeless tobacco: None  . Alcohol Use: No    Review of Systems  Constitutional: Negative for fever. Eyes: Negative for visual changes. ENT: Negative for sore throat. Cardiovascular: Negative for chest pain. Respiratory: Negative for shortness of breath. Gastrointestinal: Negative for vomiting and diarrhea. Genitourinary: Negative for dysuria. Musculoskeletal: Negative for back pain. Skin: Negative for rash. Neurological: Negative for headache. 10 point Review of Systems otherwise negative ____________________________________________   PHYSICAL EXAM:  VITAL SIGNS: ED Triage Vitals  Enc Vitals Group     BP 04/16/16 0750 210/100 mmHg     Pulse Rate 04/16/16 0750 69     Resp 04/16/16 0750 20     Temp 04/16/16 0750 98.1 F (36.7 C)     Temp src --      SpO2 04/16/16 0750 99 %     Weight 04/16/16 0750 125 lb (56.7 kg)     Height 04/16/16 0750 5\' 1"  (1.549 m)     Head Cir --      Peak Flow --      Pain Score --      Pain Loc --      Pain Edu? --      Excl. in [place]? --      Constitutional: Alert and oriented. Well appearing and in no distress. HEENT   Head: Normocephalic and atraumatic.  Eyes: Conjunctivae are normal. PERRL. Normal extraocular movements.      Ears:         Nose: No congestion/rhinnorhea.   Mouth/Throat: Mucous membranes are moist.   Neck: No stridor. Cardiovascular/Chest: Normal rate, regular rhythm.  No murmurs, rubs, or gallops. Respiratory: Normal respiratory effort without tachypnea nor retractions. Breath sounds are clear and equal bilaterally. No wheezes/rales/rhonchi. Gastrointestinal: Soft. No distention, no guarding, no rebound. Mild to moderate right-sided tenderness mid abdomen.  Genitourinary/rectal:Deferred Musculoskeletal: Nontender with normal range of motion in all extremities. No joint effusions.  No lower extremity tenderness.  No edema. Neurologic:  Normal speech and language. No gross or focal neurologic deficits are  appreciated. Skin:  Skin is warm, dry and intact. No rash noted. Psychiatric: Mood and affect are normal. Speech and behavior are normal. Patient exhibits appropriate insight and judgment.  ____________________________________________   EKG I, Lisa Roca, MD, the attending physician have personally viewed and interpreted all ECGs.  54 bpm. Normal sinus rhythm. Left axis deviation. Nonspecific T-wave ____________________________________________  LABS (pertinent positives/negatives)  Labs Reviewed  COMPREHENSIVE METABOLIC PANEL - Abnormal; Notable for the following:    BUN 21 (*)    Creatinine, Ser 1.17 (*)    ALT 12 (*)    GFR calc non Af Amer 39 (*)    GFR calc Af Amer 45 (*)    All other components within normal limits  CBC - Abnormal; Notable for the following:    WBC 2.8 (*)    Hemoglobin 11.7 (*)    Platelets 138 (*)    All other components within normal limits  URINALYSIS COMPLETEWITH MICROSCOPIC ([facility] ONLY) - Abnormal; Notable for the following:    Color, Urine YELLOW (*)    APPearance CLEAR (*)    Hgb urine dipstick 2+ (*)    Leukocytes, UA TRACE (*)    Bacteria, UA RARE (*)    Squamous Epithelial / LPF 0-5 (*)    All other components within normal limits  LIPASE, BLOOD  TROPONIN I    ____________________________________________  RADIOLOGY All Xrays were viewed by me. Imaging interpreted by Radiologist.  CT abdomen post with contrast:IMPRESSION: No acute abnormality noted. No change from the prior exam. __________________________________________  PROCEDURES  Procedure(s) performed: None  Critical Care performed: None  ____________________________________________   ED COURSE / ASSESSMENT AND PLAN  Pertinent labs & imaging results that were available during my care of the patient were reviewed by me and considered in my medical decision making (see chart for details).   This patient is overall well-appearing, however given her age and complaint  I do think it's reasonable to consider imaging. When I read my note from seeing her about a year ago the complaint really seems almost identical and workup at that point in time was negative with an ultrasound and a CT scan.  I spoke with the patient and her daughters about whether or not they think things are worse and whether or not they would like to do repeat imaging. It sounds like we will go ahead with repeat CT scan given that something made her decide to come in due to pain last night rather than any of the other night this past several months.  The CT scan of her abdomen is reassuring. Her labs are reassuring. I discussed with family and patient to follow-up with her primary care physician.    CONSULTATIONS: None   Patient / Family / Caregiver informed of clinical course, medical decision-making process, and agree with  plan.   I discussed return precautions, follow-up instructions, and discharged instructions with patient and/or family.   ___________________________________________   FINAL CLINICAL IMPRESSION(S) / ED DIAGNOSES   Final diagnoses:  Generalized abdominal pain              Note: This dictation was prepared with Dragon dictation. Any transcriptional errors that result from this process are unintentional   Lisa Roca, MD 04/16/16 1149

## 2016-04-16 NOTE — ED Notes (Signed)
Report given to Felicia, RN

## 2016-04-16 NOTE — ED Notes (Signed)
CT notified patient is done with contrast dye.

## 2016-04-16 NOTE — Discharge Instructions (Signed)
You were evaluated for night sweats and abdominal pain, and although no certain cause was found, and your exam and evaluation are reassuring in the emergency department today.  Return to the emergency department for any worsening condition including abdominal pain, dizziness or passing out, chest pain, trouble breathing, fever, or any other symptoms concerning to you.   Abdominal Pain, Adult Many things can cause belly (abdominal) pain. Most times, the belly pain is not dangerous. Many cases of belly pain can be watched and treated at home. HOME CARE   Do not take medicines that help you go poop (laxatives) unless told to by your doctor.  Only take medicine as told by your doctor.  Eat or drink as told by your doctor. Your doctor will tell you if you should be on a special diet. GET HELP IF:  You do not know what is causing your belly pain.  You have belly pain while you are sick to your stomach (nauseous) or have runny poop (diarrhea).  You have pain while you pee or poop.  Your belly pain wakes you up at night.  You have belly pain that gets worse or better when you eat.  You have belly pain that gets worse when you eat fatty foods.  You have a fever. GET HELP RIGHT AWAY IF:   The pain does not go away within 2 hours.  You keep throwing up (vomiting).  The pain changes and is only in the right or left part of the belly.  You have bloody or tarry looking poop. MAKE SURE YOU:   Understand these instructions.  Will watch your condition.  Will get help right away if you are not doing well or get worse.   This information is not intended to replace advice given to you by your health care provider. Make sure you discuss any questions you have with your health care provider.   Document Released: 03/21/2008 Document Revised: 10/24/2014 Document Reviewed: 06/12/2013 Elsevier Interactive Patient Education [employer].

## 2016-04-16 NOTE — ED Notes (Signed)
R lower abdominal pain intermittent x 1 month. Denies dysuria or nausea or vomiting. States usually occurs at night.

## 2016-05-20 ENCOUNTER — Other Ambulatory Visit: Payer: Self-pay | Admitting: Family Medicine

## 2016-05-25 ENCOUNTER — Other Ambulatory Visit: Payer: Self-pay

## 2016-05-25 NOTE — Telephone Encounter (Signed)
Patient requesting refill of Lisinopril.

## 2016-05-26 MED ORDER — LISINOPRIL 20 MG PO TABS: 20.0000 mg | ORAL_TABLET | Freq: Every day | ORAL | 0 refills | Status: DC

## 2016-07-30 ENCOUNTER — Other Ambulatory Visit: Payer: Self-pay | Admitting: Family Medicine

## 2016-07-30 DIAGNOSIS — E0590 Thyrotoxicosis, unspecified without thyrotoxic crisis or storm: Secondary | ICD-10-CM

## 2016-08-03 ENCOUNTER — Other Ambulatory Visit: Payer: Self-pay

## 2016-08-03 DIAGNOSIS — E0590 Thyrotoxicosis, unspecified without thyrotoxic crisis or storm: Secondary | ICD-10-CM

## 2016-08-03 NOTE — Telephone Encounter (Signed)
Patient requesting refill of Levothyroxin. Has an follow up on September 27, 2016 with Dr. Ancil Boozer.

## 2016-08-03 NOTE — Telephone Encounter (Signed)
Patient has appointment with Dr Ancil Boozer for December. They are requesting a refill on Levothyroxine.

## 2016-08-04 MED ORDER — LEVOTHYROXINE SODIUM 150 MCG PO TABS: 150.0000 ug | ORAL_TABLET | Freq: Every day | ORAL | 1 refills | Status: DC

## 2016-08-22 ENCOUNTER — Ambulatory Visit (INDEPENDENT_AMBULATORY_CARE_PROVIDER_SITE_OTHER): Payer: Medicare Other

## 2016-08-22 DIAGNOSIS — Z23 Encounter for immunization: Secondary | ICD-10-CM | POA: Diagnosis not present

## 2016-09-27 ENCOUNTER — Encounter: Payer: Self-pay | Admitting: Family Medicine

## 2016-09-27 ENCOUNTER — Ambulatory Visit (INDEPENDENT_AMBULATORY_CARE_PROVIDER_SITE_OTHER): Payer: Medicare Other | Admitting: Family Medicine

## 2016-09-27 VITALS — BP 146/92 | HR 85 | Temp 98.1°F | Resp 16 | Ht 61.0 in | Wt 128.5 lb

## 2016-09-27 DIAGNOSIS — Z23 Encounter for immunization: Secondary | ICD-10-CM | POA: Diagnosis not present

## 2016-09-27 DIAGNOSIS — E559 Vitamin D deficiency, unspecified: Secondary | ICD-10-CM | POA: Diagnosis not present

## 2016-09-27 DIAGNOSIS — I10 Essential (primary) hypertension: Secondary | ICD-10-CM | POA: Diagnosis not present

## 2016-09-27 DIAGNOSIS — E0590 Thyrotoxicosis, unspecified without thyrotoxic crisis or storm: Secondary | ICD-10-CM

## 2016-09-27 DIAGNOSIS — D696 Thrombocytopenia, unspecified: Secondary | ICD-10-CM

## 2016-09-27 DIAGNOSIS — E7800 Pure hypercholesterolemia, unspecified: Secondary | ICD-10-CM | POA: Diagnosis not present

## 2016-09-27 DIAGNOSIS — E039 Hypothyroidism, unspecified: Secondary | ICD-10-CM

## 2016-09-27 DIAGNOSIS — K5909 Other constipation: Secondary | ICD-10-CM

## 2016-09-27 DIAGNOSIS — N183 Chronic kidney disease, stage 3 unspecified: Secondary | ICD-10-CM

## 2016-09-27 DIAGNOSIS — D708 Other neutropenia: Secondary | ICD-10-CM

## 2016-09-27 LAB — CBC WITH DIFFERENTIAL/PLATELET
BASOS ABS: 0 {cells}/uL (ref 0–200)
BASOS PCT: 0 %
EOS ABS: 0 {cells}/uL — AB (ref 15–500)
Eosinophils Relative: 0 %
HCT: 36.7 % (ref 35.0–45.0)
HEMOGLOBIN: 11.6 g/dL — AB (ref 11.7–15.5)
LYMPHS ABS: 918 {cells}/uL (ref 850–3900)
Lymphocytes Relative: 34 %
MCH: 29.3 pg (ref 27.0–33.0)
MCHC: 31.6 g/dL — ABNORMAL LOW (ref 32.0–36.0)
MCV: 92.7 fL (ref 80.0–100.0)
MPV: 10.5 fL (ref 7.5–12.5)
Monocytes Absolute: 189 cells/uL — ABNORMAL LOW (ref 200–950)
Monocytes Relative: 7 %
NEUTROS ABS: 1593 {cells}/uL (ref 1500–7800)
Neutrophils Relative %: 59 %
Platelets: 158 10*3/uL (ref 140–400)
RBC: 3.96 MIL/uL (ref 3.80–5.10)
RDW: 14.4 % (ref 11.0–15.0)
WBC: 2.7 10*3/uL — ABNORMAL LOW (ref 3.8–10.8)

## 2016-09-27 LAB — TSH: TSH: 2.31 mIU/L

## 2016-09-27 MED ORDER — LEVOTHYROXINE SODIUM 150 MCG PO TABS: 150.0000 ug | ORAL_TABLET | Freq: Every day | ORAL | 0 refills | Status: DC

## 2016-09-27 MED ORDER — LISINOPRIL 20 MG PO TABS: 20.0000 mg | ORAL_TABLET | Freq: Every day | ORAL | 1 refills | Status: DC

## 2016-09-27 MED ORDER — VITAMIN D (ERGOCALCIFEROL) 1.25 MG (50000 UNIT) PO CAPS: 50000.0000 [IU] | ORAL_CAPSULE | ORAL | 1 refills | Status: DC

## 2016-09-27 MED ORDER — AMLODIPINE BESYLATE 5 MG PO TABS: 5.0000 mg | ORAL_TABLET | Freq: Every day | ORAL | 1 refills | Status: DC

## 2016-09-27 NOTE — Progress Notes (Signed)
Name: Lori Pacheco   MRN: [id number]    DOB: 1920-11-05   Date:09/27/2016       Progress Note  Subjective  Chief Complaint  Chief Complaint  Patient presents with  . Medication Refill    6 month F/U  . Hypertension    Edema occasionally, trouble with her toenail  . Hypothyroidism    Constipated    HPI  HTN: she is taking medication most days of the week, no chest pain or palpitation, no dizziness. She is not checking bp at home. Last bp in our office was at goal.   Hypothyroidism: she skips medications, not sure how many times a week. Discussed pill dispensing boxes and importance of returning in 6 weeks if she starts to take it daily. No dry skin, she has chronic constipation.   Chronic constipation: she takes milk of magnesia and dulcolax at times, she does not want to change her regiment at this time. She denies blood in stools or abdominal pain  Toe pain: she clipped her nails last night and it was a little sore but no pain at this time  Hyperlipidemia: she has never took medication, she is 82 yo, she is not on aspirin  Thrombocytopenia, and leukopenia: we will recheck labs. She denies easy bruise   CKI and vitamin D deficiency: she denies pruritus, normal urine volume   Patient Active Problem List   Diagnosis Date Noted  . Vitamin D deficiency 03/29/2016  . CKD (chronic kidney disease), stage III 09/18/2015  . Hypothyroidism 09/18/2015  . Essential hypertension 05/19/2015  . Hyperlipemia 05/19/2015    Past Surgical History:  Procedure Laterality Date  . NO PAST SURGERIES      No family history on file.  Social History   Social History  . Marital status: Married    Spouse name: N/A  . Number of children: N/A  . Years of education: N/A   Occupational History  . Not on file.   Social History Main Topics  . Smoking status: Never Smoker  . Smokeless tobacco: Never Used  . Alcohol use No  . Drug use: No  . Sexual activity: No   Other Topics Concern   . Not on file   Social History Narrative  . No narrative on file     Current Outpatient Prescriptions:  .  amLODipine (NORVASC) 5 MG tablet, Take 1 tablet (5 mg total) by mouth daily., Disp: 90 tablet, Rfl: 0 .  levothyroxine (SYNTHROID, LEVOTHROID) 150 MCG tablet, Take 1 tablet (150 mcg total) by mouth daily., Disp: 30 tablet, Rfl: 1 .  lisinopril (PRINIVIL,ZESTRIL) 20 MG tablet, Take 1 tablet (20 mg total) by mouth daily., Disp: 90 tablet, Rfl: 0 .  Vitamin D, Ergocalciferol, (DRISDOL) 50000 units CAPS capsule, Take 1 capsule (50,000 Units total) by mouth every 7 (seven) days., Disp: 12 capsule, Rfl: 1  No Known Allergies   ROS  Constitutional: Negative for fever or weight change.  Respiratory: Negative for cough and shortness of breath.   Cardiovascular: Negative for chest pain or palpitations.  Gastrointestinal: Negative for abdominal pain, no bowel changes.  Musculoskeletal: Negative for gait problem or joint swelling.  Skin: Negative for rash.  Neurological: Negative for dizziness or headache.  No other specific complaints in a complete review of systems (except as listed in HPI above).   Objective  Vitals:   09/27/16 0830  BP: (!) 146/92  Pulse: 85  Resp: 16  Temp: 98.1 F (36.7 C)  TempSrc: Oral  SpO2: 98%  Weight: 128 lb 8 oz (58.3 kg)  Height: 5\' 1"  (1.549 m)    Body mass index is 24.28 kg/m.  Physical Exam  Constitutional: Patient appears well-developed and well-nourished.  No distress.  HEENT: head atraumatic, normocephalic, pupils equal and reactive to light,  neck supple, throat within normal limits Cardiovascular: Normal rate, regular rhythm and normal heart sounds.  Systolic ejection murmur heard 2nd right intercostal space 2/6 blowing. No BLE edema. Pulmonary/Chest: Effort normal and breath sounds normal. No respiratory distress. Abdominal: Soft.  There is no tenderness. Psychiatric: Patient has a normal mood and affect. behavior is normal.  Judgment and thought content normal. Skin: thick toenails, no pain, no ingrown toe nail  PHQ2/9: Depression screen [facility] 2/9 09/27/2016 03/28/2016 09/17/2015  Decreased Interest 0 0 0  Down, Depressed, Hopeless 0 0 0  PHQ - 2 Score 0 0 0     Fall Risk: Fall Risk  09/27/2016 03/28/2016 09/17/2015 05/18/2015  Falls in the past year? No Yes No Yes  Number falls in past yr: - 2 or more - 2 or more  Injury with Fall? - No - Yes  Risk for fall due to : - - - History of fall(s);Impaired balance/gait  Follow up - Falls evaluation completed - -      Functional Status Survey: Is the patient deaf or have difficulty hearing?: No Does the patient have difficulty seeing, even when wearing glasses/contacts?: No Does the patient have difficulty concentrating, remembering, or making decisions?: No Does the patient have difficulty walking or climbing stairs?: No Does the patient have difficulty dressing or bathing?: No Does the patient have difficulty doing errands alone such as visiting a doctor's office or shopping?: Yes (Does not drive)    Assessment & Plan  1. Hypothyroidism, unspecified type  - TSH  2. Vitamin D deficiency  - Vitamin D, Ergocalciferol, (DRISDOL) 50000 units CAPS capsule; Take 1 capsule (50,000 Units total) by mouth every 7 (seven) days.  Dispense: 12 capsule; Refill: 1  3. CKD (chronic kidney disease), stage III  - COMPLETE METABOLIC PANEL WITH GFR  4. Essential hypertension  - COMPLETE METABOLIC PANEL WITH GFR  5. Hypercholesteremia  Not on medication   6. Need for pneumococcal vaccination   Pneumococcal conjugate vaccine 13-valent IM  7. Thrombocytopenia (HCC)  - CBC with Differential/Platelet  8. Other neutropenia (HCC)  - CBC with Differential/Platelet  9. Chronic constipation  She wants to continue otc medication

## 2016-09-28 LAB — COMPLETE METABOLIC PANEL WITH GFR
ALBUMIN: 4 g/dL (ref 3.6–5.1)
ALK PHOS: 56 U/L (ref 33–130)
ALT: 10 U/L (ref 6–29)
AST: 17 U/L (ref 10–35)
BILIRUBIN TOTAL: 0.6 mg/dL (ref 0.2–1.2)
BUN: 21 mg/dL (ref 7–25)
CO2: 26 mmol/L (ref 20–31)
Calcium: 9.4 mg/dL (ref 8.6–10.4)
Chloride: 105 mmol/L (ref 98–110)
Creat: 1.13 mg/dL — ABNORMAL HIGH (ref 0.60–0.88)
GFR, EST AFRICAN AMERICAN: 48 mL/min — AB (ref >=60)
GFR, EST NON AFRICAN AMERICAN: 42 mL/min — AB (ref >=60)
GLUCOSE: 96 mg/dL (ref 65–99)
Potassium: 4.7 mmol/L (ref 3.5–5.3)
SODIUM: 140 mmol/L (ref 135–146)
TOTAL PROTEIN: 6.8 g/dL (ref 6.1–8.1)

## 2016-11-02 ENCOUNTER — Ambulatory Visit: Payer: Medicare Other | Admitting: Family Medicine

## 2016-11-10 ENCOUNTER — Encounter: Payer: Self-pay | Admitting: Family Medicine

## 2016-11-10 ENCOUNTER — Ambulatory Visit (INDEPENDENT_AMBULATORY_CARE_PROVIDER_SITE_OTHER): Payer: Medicare Other | Admitting: Family Medicine

## 2016-11-10 VITALS — BP 142/86 | HR 77 | Temp 98.1°F | Resp 16 | Ht 61.0 in | Wt 127.2 lb

## 2016-11-10 DIAGNOSIS — I700 Atherosclerosis of aorta: Secondary | ICD-10-CM | POA: Diagnosis not present

## 2016-11-10 DIAGNOSIS — E039 Hypothyroidism, unspecified: Secondary | ICD-10-CM | POA: Diagnosis not present

## 2016-11-10 DIAGNOSIS — N183 Chronic kidney disease, stage 3 unspecified: Secondary | ICD-10-CM

## 2016-11-10 DIAGNOSIS — D708 Other neutropenia: Secondary | ICD-10-CM | POA: Diagnosis not present

## 2016-11-10 DIAGNOSIS — I10 Essential (primary) hypertension: Secondary | ICD-10-CM

## 2016-11-10 DIAGNOSIS — R1010 Upper abdominal pain, unspecified: Secondary | ICD-10-CM | POA: Diagnosis not present

## 2016-11-10 DIAGNOSIS — J302 Other seasonal allergic rhinitis: Secondary | ICD-10-CM | POA: Diagnosis not present

## 2016-11-10 DIAGNOSIS — J3089 Other allergic rhinitis: Secondary | ICD-10-CM

## 2016-11-10 DIAGNOSIS — R252 Cramp and spasm: Secondary | ICD-10-CM

## 2016-11-10 LAB — CBC WITH DIFFERENTIAL/PLATELET
Basophils Absolute: 27 cells/uL (ref 0–200)
Basophils Relative: 1 %
EOS PCT: 0 %
Eosinophils Absolute: 0 cells/uL — ABNORMAL LOW (ref 15–500)
HCT: 41.6 % (ref 35.0–45.0)
HEMOGLOBIN: 12.9 g/dL (ref 11.7–15.5)
LYMPHS ABS: 945 {cells}/uL (ref 850–3900)
Lymphocytes Relative: 35 %
MCH: 28.8 pg (ref 27.0–33.0)
MCHC: 31 g/dL — AB (ref 32.0–36.0)
MCV: 92.9 fL (ref 80.0–100.0)
MPV: 11 fL (ref 7.5–12.5)
Monocytes Absolute: 189 cells/uL — ABNORMAL LOW (ref 200–950)
Monocytes Relative: 7 %
NEUTROS ABS: 1539 {cells}/uL (ref 1500–7800)
Neutrophils Relative %: 57 %
Platelets: 153 10*3/uL (ref 140–400)
RBC: 4.48 MIL/uL (ref 3.80–5.10)
RDW: 14.2 % (ref 11.0–15.0)
WBC: 2.7 10*3/uL — AB (ref 3.8–10.8)

## 2016-11-10 LAB — TSH: TSH: 115.08 mIU/L — ABNORMAL HIGH

## 2016-11-10 MED ORDER — OLOPATADINE HCL 0.2 % OP SOLN: 1.0000 [drp] | Freq: Every day | OPHTHALMIC | 2 refills | Status: DC

## 2016-11-10 MED ORDER — ASPIRIN EC 81 MG PO TBEC: 81.0000 mg | DELAYED_RELEASE_TABLET | Freq: Every day | ORAL | 0 refills | Status: DC

## 2016-11-10 MED ORDER — LORATADINE 10 MG PO TABS: 10.0000 mg | ORAL_TABLET | Freq: Every day | ORAL | 11 refills | Status: DC

## 2016-11-10 NOTE — Progress Notes (Signed)
Name: Lori Pacheco   MRN: [id number]    DOB: 20-Jan-1921   Date:11/10/2016       Progress Note  Subjective  Chief Complaint  Chief Complaint  Patient presents with  . Abdominal Pain    right side for a while, decreased apetite, chills, night sweats  . Eye Drainage    eyes are runny sometimes  . Leg Pain    HPI  HTN: she is taking medication most days of the week, no chest pain or palpitation, she has occasionally dizziness when she gets up quickly from the bed. She is not checking bp at home. Advised to take half dose of medication daily, instead of taking it whenever she feels like  Hyperlipidemia: she has never took medication, she is 83 yo, she is not on aspirin, but willing to take it since she has atherosclerosis of aorta  Leukopenia: we will recheck labs. She denies easy bruise   CKI and vitamin D deficiency: she denies pruritus, normal urine volume  Abdominal pain: going on for the past 2.5 years, had multiple trips to [facility], negative [place] and CT abdomen, it is usually in the mornings, seems to be either right side of abdomen, cramping like and resolves by itself or sometimes with tylenol or Advil prn. Not associated with nausea or vomiting or change in appetite. No blood in stools.   Leg Cramps: she has noticed leg cramps " for a long time ", usually at night, taking mustard and seems to have helped.   AR: she has a long history of AR, she is always outside, she has intermittent sneezing, rhinorrhea and her eyes are watery and itchy at times.    Patient Active Problem List   Diagnosis Date Noted  . Atherosclerosis of abdominal aorta ([place]) 11/10/2016  . Other neutropenia ([place]) 11/10/2016  . Perennial allergic rhinitis with seasonal variation 11/10/2016  . Vitamin D deficiency 03/29/2016  . CKD (chronic kidney disease), stage III 09/18/2015  . Hypothyroidism 09/18/2015  . Essential hypertension 05/19/2015  . Hyperlipemia 05/19/2015    Past Surgical History:   Procedure Laterality Date  . NO PAST SURGERIES      Family History  Problem Relation Age of Onset  . Heart disease Brother   . Cancer Daughter   . Diabetes Brother   . Alcohol abuse Brother     Social History   Social History  . Marital status: Married    Spouse name: N/A  . Number of children: N/A  . Years of education: N/A   Occupational History  . Not on file.   Social History Main Topics  . Smoking status: Never Smoker  . Smokeless tobacco: Former Systems developer    Types: Snuff    Quit date: 09/27/1986  . Alcohol use No  . Drug use: No  . Sexual activity: No   Other Topics Concern  . Not on file   Social History Narrative   Lives with one daughter, still rakes her yard, cuts wood, feeds her chicken, cleans her house and cooks.      Current Outpatient Prescriptions:  .  amLODipine (NORVASC) 5 MG tablet, Take 1 tablet (5 mg total) by mouth daily., Disp: 90 tablet, Rfl: 1 .  aspirin EC 81 MG tablet, Take 1 tablet (81 mg total) by mouth daily., Disp: 30 tablet, Rfl: 0 .  levothyroxine (SYNTHROID, LEVOTHROID) 150 MCG tablet, Take 1 tablet (150 mcg total) by mouth daily., Disp: 90 tablet, Rfl: 0 .  lisinopril (PRINIVIL,ZESTRIL) 20 MG  tablet, Take 1 tablet (20 mg total) by mouth daily., Disp: 90 tablet, Rfl: 1 .  loratadine (CLARITIN) 10 MG tablet, Take 1 tablet (10 mg total) by mouth daily., Disp: 30 tablet, Rfl: 11 .  Olopatadine HCl 0.2 % SOLN, Apply 1 drop to eye daily., Disp: 2.5 mL, Rfl: 2 .  Vitamin D, Ergocalciferol, (DRISDOL) 50000 units CAPS capsule, Take 1 capsule (50,000 Units total) by mouth every 7 (seven) days., Disp: 12 capsule, Rfl: 1  No Known Allergies   ROS  Constitutional: Negative for fever or weight change.  Respiratory: Negative for cough and shortness of breath.   Cardiovascular: Negative for chest pain or palpitations.  Gastrointestinal: Positive for intermittent abdominal pain, no bowel changes ( always constipated - taking mild of magnesia prn  )  Musculoskeletal: Negative for gait problem or joint swelling.  Skin: Negative for rash.  Neurological: Positive  For intermittent  Dizziness ( when she jumps out of bed ) or headache.  No other specific complaints in a complete review of systems (except as listed in HPI above).  Objective  Vitals:   11/10/16 1010  BP: (!) 142/86  Pulse: 77  Resp: 16  Temp: 98.1 F (36.7 C)  SpO2: 98%  Weight: 127 lb 3 oz (57.7 kg)  Height: '5\' 1"'  (1.549 m)    Body mass index is 24.03 kg/m.  Physical Exam  Constitutional: Patient appears well-developed and well-nourished. No distress.  HEENT: head atraumatic, normocephalic, pupils equal and reactive to light, neck supple, throat within normal limits Cardiovascular: Normal rate, regular rhythm and normal heart sounds.  No murmur heard. No BLE edema. Pulmonary/Chest: Effort normal and breath sounds normal. No respiratory distress. Abdominal: Soft.  There is no tenderness. Psychiatric: Patient has a normal mood and affect. behavior is normal. Judgment and thought content normal. Muscular Skeletal: no back pain, negative straight leg raise  Recent Results (from the past 2160 hour(s))  CBC with Differential/Platelet     Status: Abnormal   Collection Time: 09/27/16  9:15 AM  Result Value Ref Range   WBC 2.7 (L) 3.8 - 10.8 K/uL   RBC 3.96 3.80 - 5.10 MIL/uL   Hemoglobin 11.6 (L) 11.7 - 15.5 g/dL   HCT 36.7 35.0 - 45.0 %   MCV 92.7 80.0 - 100.0 fL   MCH 29.3 27.0 - 33.0 pg   MCHC 31.6 (L) 32.0 - 36.0 g/dL   RDW 14.4 11.0 - 15.0 %   Platelets 158 140 - 400 K/uL   MPV 10.5 7.5 - 12.5 fL   Neutro Abs 1,593 1,500 - 7,800 cells/uL   Lymphs Abs 918 850 - 3,900 cells/uL   Monocytes Absolute 189 (L) 200 - 950 cells/uL   Eosinophils Absolute 0 (L) 15 - 500 cells/uL   Basophils Absolute 0 0 - 200 cells/uL   Neutrophils Relative % 59 %   Lymphocytes Relative 34 %   Monocytes Relative 7 %   Eosinophils Relative 0 %   Basophils Relative 0 %    Smear Review Criteria for review not met   COMPLETE METABOLIC PANEL WITH GFR     Status: Abnormal   Collection Time: 09/27/16  9:15 AM  Result Value Ref Range   Sodium 140 135 - 146 mmol/L   Potassium 4.7 3.5 - 5.3 mmol/L   Chloride 105 98 - 110 mmol/L   CO2 26 20 - 31 mmol/L   Glucose, Bld 96 65 - 99 mg/dL   BUN 21 7 - 25 mg/dL  Creat 1.13 (H) 0.60 - 0.88 mg/dL    Comment:   For patients > or = 81 years of age: The upper reference limit for Creatinine is approximately 13% higher for people identified as African-American.      Total Bilirubin 0.6 0.2 - 1.2 mg/dL   Alkaline Phosphatase 56 33 - 130 U/L   AST 17 10 - 35 U/L   ALT 10 6 - 29 U/L   Total Protein 6.8 6.1 - 8.1 g/dL   Albumin 4.0 3.6 - 5.1 g/dL   Calcium 9.4 8.6 - 10.4 mg/dL   GFR, Est African American 48 (L) >=60 mL/min   GFR, Est Non African American 42 (L) >=60 mL/min  TSH     Status: None   Collection Time: 09/27/16  9:15 AM  Result Value Ref Range   TSH 2.31 mIU/L    Comment:   Reference Range   > or = 20 Years  0.40-4.50   Pregnancy Range First trimester  0.26-2.66 Second trimester 0.55-2.73 Third trimester  0.43-2.91         PHQ2/9: Depression screen [facility] 2/9 11/10/2016 09/27/2016 03/28/2016 09/17/2015  Decreased Interest 0 0 0 0  Down, Depressed, Hopeless 0 0 0 0  PHQ - 2 Score 0 0 0 0    Fall Risk: Fall Risk  11/10/2016 09/27/2016 03/28/2016 09/17/2015 05/18/2015  Falls in the past year? No No Yes No Yes  Number falls in past yr: - - 2 or more - 2 or more  Injury with Fall? - - No - Yes  Risk for fall due to : - - - - History of fall(s);Impaired balance/gait  Follow up - - Falls evaluation completed - -     Functional Status Survey: Is the patient deaf or have difficulty hearing?: No Does the patient have difficulty seeing, even when wearing glasses/contacts?: No Does the patient have difficulty concentrating, remembering, or making decisions?: No Does the patient have difficulty walking or  climbing stairs?: No Does the patient have difficulty dressing or bathing?: No Does the patient have difficulty doing errands alone such as visiting a doctor's office or shopping?: Yes   Assessment & Plan  1. Leg cramps  Stretch legs before bed  - VITAMIN D 25 Hydroxy (Vit-D Deficiency, Fractures) - Magnesium - BASIC METABOLIC PANEL WITH GFR  2. CKD (chronic kidney disease), stage III  - BASIC METABOLIC PANEL WITH GFR  3. Other neutropenia (HCC)  - CBC with Differential/Platelet   4. Pain of upper abdomen  It has been chronic and recurrent for the past 2 years, seen by [facility] many times, negative [place], and CT - gave her reassurance, it may be muscular since she is active and chops wood with right arm  5. Atherosclerosis of abdominal aorta (HCC)  Discussed aspirin , keep bp at goal, she wants to hold off on statin therapy   6. Perennial allergic rhinitis with seasonal variation  - loratadine (CLARITIN) 10 MG tablet; Take 1 tablet (10 mg total) by mouth daily.  Dispense: 30 tablet; Refill: 11 - Olopatadine HCl 0.2 % SOLN; Apply 1 drop to eye daily.  Dispense: 2.5 mL; Refill: 2  7. Hypothyroidism, unspecified type  TSH  8. Essential hypertension

## 2016-11-11 LAB — BASIC METABOLIC PANEL WITH GFR
BUN: 18 mg/dL (ref 7–25)
CALCIUM: 9.9 mg/dL (ref 8.6–10.4)
CO2: 22 mmol/L (ref 20–31)
Chloride: 105 mmol/L (ref 98–110)
Creat: 1.34 mg/dL — ABNORMAL HIGH (ref 0.60–0.88)
GFR, EST NON AFRICAN AMERICAN: 34 mL/min — AB (ref >=60)
GFR, Est African American: 39 mL/min — ABNORMAL LOW (ref >=60)
GLUCOSE: 110 mg/dL — AB (ref 65–99)
Potassium: 5.3 mmol/L (ref 3.5–5.3)
Sodium: 142 mmol/L (ref 135–146)

## 2016-11-11 LAB — VITAMIN D 25 HYDROXY (VIT D DEFICIENCY, FRACTURES): Vit D, 25-Hydroxy: 45 ng/mL (ref 30–100)

## 2016-11-11 LAB — MAGNESIUM: Magnesium: 2.4 mg/dL (ref 1.5–2.5)

## 2016-11-21 ENCOUNTER — Telehealth: Payer: Self-pay

## 2016-11-21 NOTE — Telephone Encounter (Signed)
-----   Message from Steele Sizer, MD sent at 11/11/2016  7:53 AM EST ----- Normal Vitamin D, continue otc supplementation  Sugar is stable, kidney function showed CKI stage III - but mild drop - and transaminases are within normal limits She has leukopenia, but stable, it was low back in 2016 TSH is very high, she needs to resume her medication. Take it daily. Place of a daily medication dispenser and take it in am

## 2016-11-21 NOTE — Telephone Encounter (Signed)
Left message for patient to return my call regarding labs on voicemail.

## 2016-12-19 ENCOUNTER — Ambulatory Visit (INDEPENDENT_AMBULATORY_CARE_PROVIDER_SITE_OTHER): Payer: Medicare Other

## 2016-12-19 VITALS — BP 144/92 | HR 60 | Temp 97.0°F | Ht 61.0 in | Wt 135.4 lb

## 2016-12-19 DIAGNOSIS — Z0000 Encounter for general adult medical examination without abnormal findings: Secondary | ICD-10-CM

## 2016-12-19 NOTE — Patient Instructions (Signed)

## 2016-12-19 NOTE — Progress Notes (Addendum)
Subjective:   Lori Pacheco is a 83 y.o. female who presents for Medicare Annual (Subsequent) preventive examination.   Cardiac Risk Factors include: advanced age (>3men, >110 women);hypertension;dyslipidemia  Functional ability/safety issues: Discussed Hearing issues: None  Activities of daily living: Discussed Home safety issues: No Issues  End Of Life Planning: Offered verbal information regarding advanced directives, healthcare power of attorney. Information given today.  Preventative care, Health maintenance, Preventative health measures discussed.  Preventative screenings discussed today: lab work, colonoscopy,  mammogram, DEXA.   Lifestyle risk factor issued reviewed: Diet, exercise, weight management, nutrition/diet.  Preventative health measures discussed (5-10 year plan).  Reviewed and recommended vaccinations: None  Depression screening: Done Fall risk screening: Done Discuss ADLs/IADLs: Done  Current medical providers: See HPI  Other health risk factors identified this visit: No other issues Cognitive impairment issues: None identified  All above discussed with patient. Appropriate education, counseling and referral will be made based upon the above.   Objective:     Vitals: BP (!) 144/92 (BP Location: Left Arm)   Pulse 60   Temp 97 F (36.1 C) (Oral)   Ht 5\' 1"  (1.549 m)   Wt 135 lb 6.4 oz (61.4 kg)   BMI 25.58 kg/m   Body mass index is 25.58 kg/m.   Tobacco History  Smoking Status  . Never Smoker  Smokeless Tobacco  . Former Systems developer  . Types: Snuff  . Quit date: 09/27/1986     Counseling given: Not Answered   Past Medical History:  Diagnosis Date  . Hyperlipidemia   . Hypertension   . Thyroid disease    Past Surgical History:  Procedure Laterality Date  . NO PAST SURGERIES     Family History  Problem Relation Age of Onset  . Heart disease Brother   . Cancer Daughter   . Diabetes Brother   . Alcohol abuse Brother    History   Sexual Activity  . Sexual activity: No    Outpatient Encounter Prescriptions as of 12/19/2016  Medication Sig  . amLODipine (NORVASC) 5 MG tablet Take 1 tablet (5 mg total) by mouth daily.  Marland Kitchen aspirin EC 81 MG tablet Take 1 tablet (81 mg total) by mouth daily.  Marland Kitchen levothyroxine (SYNTHROID, LEVOTHROID) 150 MCG tablet Take 1 tablet (150 mcg total) by mouth daily.  Marland Kitchen lisinopril (PRINIVIL,ZESTRIL) 20 MG tablet Take 1 tablet (20 mg total) by mouth daily.  Marland Kitchen loratadine (CLARITIN) 10 MG tablet Take 1 tablet (10 mg total) by mouth daily.  . Olopatadine HCl 0.2 % SOLN Apply 1 drop to eye daily.  . Vitamin D, Ergocalciferol, (DRISDOL) 50000 units CAPS capsule Take 1 capsule (50,000 Units total) by mouth every 7 (seven) days.   No facility-administered encounter medications on file as of 12/19/2016.     Activities of Daily Living In your present state of health, do you have any difficulty performing the following activities: 12/19/2016 11/10/2016  Hearing? N N  Vision? N N  Difficulty concentrating or making decisions? N N  Walking or climbing stairs? N N  Dressing or bathing? N N  Doing errands, shopping? Tempie Donning  Preparing Food and eating ? N -  Using the Toilet? N -  In the past six months, have you accidently leaked urine? Y -  Do you have problems with loss of bowel control? N -  Managing your Medications? N -  Managing your Finances? N -  Housekeeping or managing your Housekeeping? N -  Some recent data might  be hidden    Patient Care Team: Steele Sizer, MD as PCP - General (Family Medicine)    Assessment:     Exercise Activities and Dietary recommendations Intensity: Mild, Exercise limited by: None identified  Goals    . Increase water intake          Recommended to continue drinking 6-8 glasses of water a day.      Fall Risk Fall Risk  12/19/2016 11/10/2016 09/27/2016 03/28/2016 09/17/2015  Falls in the past year? Yes No No Yes No  Number falls in past yr: 2 or more - - 2 or more  -  Injury with Fall? No - - No -  Risk for fall due to : Other (Comment) - - - -  Risk for fall due to (comments): "due to getting in a hurry" - - - -  Follow up Falls prevention discussed - - Falls evaluation completed -   Depression Screen PHQ 2/9 Scores 12/19/2016 11/10/2016 09/27/2016 03/28/2016  PHQ - 2 Score 0 0 0 0     Cognitive Function     6CIT Screen 12/19/2016  What Year? 4 points  What month? 0 points  What time? 3 points  Count back from 20 4 points  Months in reverse 4 points  Repeat phrase 8 points  Total Score 23    Immunization History  Administered Date(s) Administered  . Influenza, High Dose Seasonal PF 09/17/2015, 08/22/2016  . Influenza-Unspecified 09/02/2014  . Pneumococcal Conjugate-13 11/14/2013, 09/27/2016  . Pneumococcal Polysaccharide-23 11/29/2011  . Tdap 07/06/2012  . Zoster 05/29/2008   Screening Tests Health Maintenance  Topic Date Due  . TETANUS/TDAP  07/06/2022  . INFLUENZA VACCINE  Completed  . DEXA SCAN  Completed  . PNA vac Low Risk Adult  Completed      Plan:  I have personally reviewed and addressed the Medicare Annual Wellness questionnaire and have noted the following in the patient's chart:  A. Medical and social history B. Use of alcohol, tobacco or illicit drugs  C. Current medications and supplements D. Functional ability and status E.  Nutritional status F.  Physical activity G. Advance directives H. List of other physicians I.  Hospitalizations, surgeries, and ER visits in previous 12 months J.  Ida such as hearing and vision if needed, cognitive and depression L. Referrals and appointments - none  In addition, I have reviewed and discussed with patient certain preventive protocols, quality metrics, and best practice recommendations. A written personalized care plan for preventive services as well as general preventive health recommendations were provided to patient.  See attached scanned questionnaire  for additional information.   Signed,  Fabio Neighbors, LPN Nurse Health Advisor   MD Recommendations: None.  I have reviewed this encounter including the documentation in this note and/or discussed this patient with the provider Emelda Fear, LPN,. I am certifying that I agree with the content of this note as supervising physician.  Steele Sizer, MD [facility] 03/30/2017, 2:06 PM

## 2017-03-28 ENCOUNTER — Telehealth: Payer: Self-pay

## 2017-03-28 ENCOUNTER — Encounter: Payer: Self-pay | Admitting: Family Medicine

## 2017-03-28 ENCOUNTER — Ambulatory Visit (INDEPENDENT_AMBULATORY_CARE_PROVIDER_SITE_OTHER): Payer: Medicare Other | Admitting: Family Medicine

## 2017-03-28 VITALS — BP 142/90 | HR 64 | Temp 98.2°F | Resp 16 | Ht 61.0 in | Wt 127.9 lb

## 2017-03-28 DIAGNOSIS — E039 Hypothyroidism, unspecified: Secondary | ICD-10-CM

## 2017-03-28 DIAGNOSIS — D708 Other neutropenia: Secondary | ICD-10-CM | POA: Diagnosis not present

## 2017-03-28 DIAGNOSIS — E559 Vitamin D deficiency, unspecified: Secondary | ICD-10-CM | POA: Diagnosis not present

## 2017-03-28 DIAGNOSIS — I700 Atherosclerosis of aorta: Secondary | ICD-10-CM

## 2017-03-28 DIAGNOSIS — E782 Mixed hyperlipidemia: Secondary | ICD-10-CM | POA: Diagnosis not present

## 2017-03-28 DIAGNOSIS — K5909 Other constipation: Secondary | ICD-10-CM

## 2017-03-28 DIAGNOSIS — R252 Cramp and spasm: Secondary | ICD-10-CM | POA: Diagnosis not present

## 2017-03-28 DIAGNOSIS — N183 Chronic kidney disease, stage 3 unspecified: Secondary | ICD-10-CM

## 2017-03-28 DIAGNOSIS — R739 Hyperglycemia, unspecified: Secondary | ICD-10-CM

## 2017-03-28 DIAGNOSIS — I10 Essential (primary) hypertension: Secondary | ICD-10-CM | POA: Diagnosis not present

## 2017-03-28 LAB — COMPLETE METABOLIC PANEL WITH GFR
ALBUMIN: 4.1 g/dL (ref 3.6–5.1)
ALK PHOS: 49 U/L (ref 33–130)
ALT: 11 U/L (ref 6–29)
AST: 17 U/L (ref 10–35)
BUN: 23 mg/dL (ref 7–25)
CALCIUM: 9.3 mg/dL (ref 8.6–10.4)
CO2: 27 mmol/L (ref 20–31)
Chloride: 104 mmol/L (ref 98–110)
Creat: 1.41 mg/dL — ABNORMAL HIGH (ref 0.60–0.88)
GFR, Est African American: 37 mL/min — ABNORMAL LOW (ref >=60)
GFR, Est Non African American: 32 mL/min — ABNORMAL LOW (ref >=60)
Glucose, Bld: 92 mg/dL (ref 65–99)
POTASSIUM: 4.5 mmol/L (ref 3.5–5.3)
Sodium: 140 mmol/L (ref 135–146)
Total Bilirubin: 0.7 mg/dL (ref 0.2–1.2)
Total Protein: 7.1 g/dL (ref 6.1–8.1)

## 2017-03-28 LAB — LIPID PANEL
CHOLESTEROL: 234 mg/dL — AB (ref <200)
HDL: 76 mg/dL (ref >50)
LDL Cholesterol: 144 mg/dL — ABNORMAL HIGH (ref <100)
TRIGLYCERIDES: 70 mg/dL (ref <150)
Total CHOL/HDL Ratio: 3.1 Ratio (ref <5.0)
VLDL: 14 mg/dL (ref <30)

## 2017-03-28 LAB — CBC WITH DIFFERENTIAL/PLATELET
Basophils Absolute: 22 cells/uL (ref 0–200)
Basophils Relative: 1 %
EOS PCT: 2 %
Eosinophils Absolute: 44 cells/uL (ref 15–500)
HCT: 37.2 % (ref 35.0–45.0)
HEMOGLOBIN: 11.7 g/dL (ref 11.7–15.5)
LYMPHS ABS: 880 {cells}/uL (ref 850–3900)
Lymphocytes Relative: 40 %
MCH: 29.9 pg (ref 27.0–33.0)
MCHC: 31.5 g/dL — AB (ref 32.0–36.0)
MCV: 95.1 fL (ref 80.0–100.0)
MPV: 10.4 fL (ref 7.5–12.5)
Monocytes Absolute: 132 cells/uL — ABNORMAL LOW (ref 200–950)
Monocytes Relative: 6 %
NEUTROS PCT: 51 %
Neutro Abs: 1122 cells/uL — ABNORMAL LOW (ref 1500–7800)
Platelets: 142 10*3/uL (ref 140–400)
RBC: 3.91 MIL/uL (ref 3.80–5.10)
RDW: 14.6 % (ref 11.0–15.0)
WBC: 2.2 10*3/uL — AB (ref 3.8–10.8)

## 2017-03-28 LAB — TSH: TSH: 36.67 m[IU]/L — AB

## 2017-03-28 MED ORDER — LISINOPRIL 20 MG PO TABS: 20.0000 mg | ORAL_TABLET | Freq: Every day | ORAL | 1 refills | Status: DC

## 2017-03-28 MED ORDER — ROPINIROLE HCL 0.5 MG PO TABS: 0.2500 mg | ORAL_TABLET | Freq: Every day | ORAL | 1 refills | Status: DC

## 2017-03-28 MED ORDER — VITAMIN D (ERGOCALCIFEROL) 1.25 MG (50000 UNIT) PO CAPS: 50000.0000 [IU] | ORAL_CAPSULE | ORAL | 1 refills | Status: DC

## 2017-03-28 MED ORDER — AMLODIPINE BESYLATE 5 MG PO TABS: 5.0000 mg | ORAL_TABLET | Freq: Every day | ORAL | 1 refills | Status: DC

## 2017-03-28 NOTE — Progress Notes (Signed)
Name: Lori Pacheco   MRN: [id number]    DOB: 03-17-1921   Date:03/28/2017       Progress Note  Subjective  Chief Complaint  Chief Complaint  Patient presents with  . Medication Refill    6 month F/U  . Constipation    Will go a couple of days without a bowel movement, will have to take Milk of Magnesium to go to bathroom, her right side will start bothering her.  . Hypertension    Lightheadness  . Allergic Rhinitis     Doing well with symptoms  . Leg Cramps    Radiates to feet and takes Tylenol for relief.    HPI  HTN: she is taking medication most days of the week, no chest pain or palpitation, she has occasionally dizziness when she gets up quickly from the bed. She is not checking bp at home. Advised to take half dose of medication daily, instead of taking it whenever she feels like - daughter states she will try to get her to take it daily.   Hyperlipidemia: she has never took medication, she is 83 yo, she is not on aspirin, but willing to take it since she has atherosclerosis of aorta  Leukopenia: we will recheck labs. She denies easy bruise   CKI and vitamin D deficiency: she denies pruritus, normal urine volume, never seen by nephrologist but if GFR is lower we will make the referral  Abdominal pain: going on for the past 3 years, had multiple trips to [facility], negative [place] and CT abdomen, it is usually in the mornings, seems to be either right side of abdomen, cramping like and resolves by itself or sometimes with tylenol or Advil prn. Not associated with nausea or vomiting or change in appetite. No blood in stools.   Leg Cramps: she has noticed leg cramps " for a long time ", usually at night, taking mustard and seems to have helped, it is worse in am's and evenings. She feels like a crawling sensation. We will start her on Requip  Constipation: chronic, intermittent, taking milk of magnesia. Discussed otc Miralax if she would like to try it.   AR: she has a long  history of AR, she is always outside, she has intermittent sneezing, rhinorrhea and her eyes are watery and itchy at times.   Weight loss: it may be because she has been more compliant with thyroid medication, but discussed importance of eating.  Hypothyroidism: she is taking Synthroid 150 mcg daily, she has lost 7 lbs since last visit, no diarrhea or palpitation. She has episodes of fatigue  Hyperglycemia: she has polydipsia and polyuria. We will check hgbA1C.   Patient Active Problem List   Diagnosis Date Noted  . Atherosclerosis of abdominal aorta ([place]) 11/10/2016  . Other neutropenia ([place]) 11/10/2016  . Perennial allergic rhinitis with seasonal variation 11/10/2016  . Vitamin D deficiency 03/29/2016  . CKD (chronic kidney disease), stage III 09/18/2015  . Hypothyroidism 09/18/2015  . Essential hypertension 05/19/2015  . Hyperlipemia 05/19/2015    Past Surgical History:  Procedure Laterality Date  . NO PAST SURGERIES      Family History  Problem Relation Age of Onset  . Heart disease Brother   . Cancer Daughter   . Diabetes Brother   . Alcohol abuse Brother     Social History   Social History  . Marital status: Married    Spouse name: N/A  . Number of children: N/A  . Years of education:  N/A   Occupational History  . Not on file.   Social History Main Topics  . Smoking status: Never Smoker  . Smokeless tobacco: Former Systems developer    Types: Snuff    Quit date: 09/27/1986  . Alcohol use No  . Drug use: No  . Sexual activity: No   Other Topics Concern  . Not on file   Social History Narrative   Lives with one daughter, still rakes her yard, cuts wood, feeds her chicken, cleans her house and cooks.      Current Outpatient Prescriptions:  .  amLODipine (NORVASC) 5 MG tablet, Take 1 tablet (5 mg total) by mouth daily., Disp: 90 tablet, Rfl: 1 .  aspirin EC 81 MG tablet, Take 1 tablet (81 mg total) by mouth daily., Disp: 30 tablet, Rfl: 0 .  levothyroxine  (SYNTHROID, LEVOTHROID) 150 MCG tablet, Take 1 tablet (150 mcg total) by mouth daily., Disp: 90 tablet, Rfl: 0 .  lisinopril (PRINIVIL,ZESTRIL) 20 MG tablet, Take 1 tablet (20 mg total) by mouth daily., Disp: 90 tablet, Rfl: 1 .  loratadine (CLARITIN) 10 MG tablet, Take 1 tablet (10 mg total) by mouth daily., Disp: 30 tablet, Rfl: 11 .  Olopatadine HCl 0.2 % SOLN, Apply 1 drop to eye daily., Disp: 2.5 mL, Rfl: 2 .  Vitamin D, Ergocalciferol, (DRISDOL) 50000 units CAPS capsule, Take 1 capsule (50,000 Units total) by mouth every 7 (seven) days., Disp: 12 capsule, Rfl: 1  No Known Allergies   ROS  Constitutional: Negative for fever, positive for  weight change - loss ( but her TSH was very high ) .  Respiratory: Negative for cough and shortness of breath.   Cardiovascular: Negative for chest pain or palpitations.  Gastrointestinal: Negative for abdominal pain, no bowel changes.  Musculoskeletal: Negative for gait problem or joint swelling.  Skin: Negative for rash.  Neurological: Negative for dizziness or headache.  No other specific complaints in a complete review of systems (except as listed in HPI above).  Objective  Vitals:   03/28/17 0846  BP: (!) 148/92  Pulse: 64  Resp: 16  Temp: 98.2 F (36.8 C)  TempSrc: Oral  SpO2: 92%  Weight: 127 lb 14.4 oz (58 kg)  Height: 5\' 1"  (1.549 m)    Body mass index is 24.17 kg/m.  Physical Exam  Constitutional: Patient appears well-developed .  No distress.  HEENT: head atraumatic, normocephalic, pupils equal and reactive to light,neck supple, throat within normal limits Cardiovascular: Normal rate, regular rhythm and normal heart sounds.  No murmur heard. No BLE edema. Pulmonary/Chest: Effort normal and breath sounds normal. No respiratory distress. Abdominal: Soft.  There is no tenderness.Normal bowel sounds Psychiatric: Patient has a normal mood and affect. behavior is normal. Judgment and thought content  normal.  PHQ2/9: Depression screen [facility] 2/9 03/28/2017 12/19/2016 11/10/2016 09/27/2016 03/28/2016  Decreased Interest 0 0 0 0 0  Down, Depressed, Hopeless 0 0 0 0 0  PHQ - 2 Score 0 0 0 0 0     Fall Risk: Fall Risk  03/28/2017 12/19/2016 11/10/2016 09/27/2016 03/28/2016  Falls in the past year? Yes Yes No No Yes  Number falls in past yr: 2 or more 2 or more - - 2 or more  Injury with Fall? No No - - No  Risk for fall due to : - Other (Comment) - - -  Risk for fall due to (comments): - "due to getting in a hurry" - - -  Follow up - Falls  prevention discussed - - Falls evaluation completed     Functional Status Survey: Is the patient deaf or have difficulty hearing?: No Does the patient have difficulty seeing, even when wearing glasses/contacts?: No Does the patient have difficulty concentrating, remembering, or making decisions?: No Does the patient have difficulty walking or climbing stairs?: No Does the patient have difficulty dressing or bathing?: No Does the patient have difficulty doing errands alone such as visiting a doctor's office or shopping?: No    Assessment & Plan  1. CKD (chronic kidney disease), stage III  - Parathyroid hormone, intact (no Ca) - COMPLETE METABOLIC PANEL WITH GFR If worse we will refer to nephrologist   2. Other neutropenia ([place])  - CBC with Differential/Platelet  3. Leg cramps  - Ferritin - rOPINIRole (REQUIP) 0.5 MG tablet; Take 0.5-1 tablets (0.25-0.5 mg total) by mouth at bedtime.  Dispense: 90 tablet; Refill: 1  4. Hypothyroidism, unspecified type  - TSH  5. Atherosclerosis of abdominal aorta ([place])  She is on aspirin, not on statin therapy and has leg cramps and we will hold off on medication at this time  6. Vitamin D deficiency  - Vitamin D, Ergocalciferol, (DRISDOL) 50000 units CAPS capsule; Take 1 capsule (50,000 Units total) by mouth every 7 (seven) days.  Dispense: 12 capsule; Refill: 1  7. Essential hypertension  - COMPLETE  METABOLIC PANEL WITH GFR - lisinopril (PRINIVIL,ZESTRIL) 20 MG tablet; Take 1 tablet (20 mg total) by mouth daily.  Dispense: 90 tablet; Refill: 1 - amLODipine (NORVASC) 5 MG tablet; Take 1 tablet (5 mg total) by mouth daily.  Dispense: 90 tablet; Refill: 1  8. Mixed hyperlipidemia  - Lipid panel  9. Hyperglycemia  - Hemoglobin A1c  10. Other constipation  Continue prn medication

## 2017-03-29 LAB — HEMOGLOBIN A1C
Hgb A1c MFr Bld: 5.7 % — ABNORMAL HIGH (ref <5.7)
MEAN PLASMA GLUCOSE: 117 mg/dL

## 2017-03-29 LAB — PARATHYROID HORMONE, INTACT (NO CA): PTH: 150 pg/mL — ABNORMAL HIGH (ref 14–64)

## 2017-03-29 LAB — FERRITIN: FERRITIN: 87 ng/mL (ref 20–288)

## 2017-04-03 ENCOUNTER — Telehealth: Payer: Self-pay

## 2017-04-03 NOTE — Telephone Encounter (Signed)
-----   Message from Steele Sizer, MD sent at 03/30/2017  1:55 PM EDT ----- PTH is high, secondary hyperparathyroidism She has pre-diabetes TSH is still elevated, but improved, she needs to take thyroid medication daily , not every other day Lipid panel has improved CKI, normal fasting glucose and liver enzymes ( please send copy to nephrologist) WBC is slightly lower, I can refer her to hematologist or continue to monitor, it was the same 2 years ago, no anemia Normal Ferritin level ( iron storage )

## 2017-04-03 NOTE — Telephone Encounter (Signed)
Left voicemail for patient to call us back regarding labs.

## 2017-04-03 NOTE — Telephone Encounter (Signed)
Left message for patient to call back regarding labs. 

## 2017-05-11 ENCOUNTER — Encounter: Payer: Self-pay | Admitting: Emergency Medicine

## 2017-05-11 ENCOUNTER — Ambulatory Visit: Payer: Medicare Other | Admitting: Family Medicine

## 2017-05-11 ENCOUNTER — Emergency Department
Admission: EM | Admit: 2017-05-11 | Discharge: 2017-05-11 | Disposition: A | Discharge status: Home / Self Care | Payer: Medicare Other | Attending: Emergency Medicine | Admitting: Emergency Medicine

## 2017-05-11 ENCOUNTER — Emergency Department: Payer: Medicare Other

## 2017-05-11 DIAGNOSIS — E039 Hypothyroidism, unspecified: Secondary | ICD-10-CM | POA: Diagnosis not present

## 2017-05-11 DIAGNOSIS — Z79899 Other long term (current) drug therapy: Secondary | ICD-10-CM | POA: Diagnosis not present

## 2017-05-11 DIAGNOSIS — I129 Hypertensive chronic kidney disease with stage 1 through stage 4 chronic kidney disease, or unspecified chronic kidney disease: Secondary | ICD-10-CM | POA: Insufficient documentation

## 2017-05-11 DIAGNOSIS — R1030 Lower abdominal pain, unspecified: Secondary | ICD-10-CM | POA: Insufficient documentation

## 2017-05-11 DIAGNOSIS — Z7982 Long term (current) use of aspirin: Secondary | ICD-10-CM | POA: Insufficient documentation

## 2017-05-11 DIAGNOSIS — N183 Chronic kidney disease, stage 3 (moderate): Secondary | ICD-10-CM | POA: Insufficient documentation

## 2017-05-11 DIAGNOSIS — R109 Unspecified abdominal pain: Secondary | ICD-10-CM

## 2017-05-11 LAB — COMPREHENSIVE METABOLIC PANEL
ALT: 14 U/L (ref 14–54)
ANION GAP: 7 (ref 5–15)
AST: 26 U/L (ref 15–41)
Albumin: 4.3 g/dL (ref 3.5–5.0)
Alkaline Phosphatase: 41 U/L (ref 38–126)
BUN: 24 mg/dL — ABNORMAL HIGH (ref 6–20)
CHLORIDE: 106 mmol/L (ref 101–111)
CO2: 25 mmol/L (ref 22–32)
Calcium: 9.5 mg/dL (ref 8.9–10.3)
Creatinine, Ser: 1.55 mg/dL — ABNORMAL HIGH (ref 0.44–1.00)
GFR calc non Af Amer: 27 mL/min — ABNORMAL LOW (ref >60)
GFR, EST AFRICAN AMERICAN: 32 mL/min — AB (ref >60)
Glucose, Bld: 120 mg/dL — ABNORMAL HIGH (ref 65–99)
POTASSIUM: 4.6 mmol/L (ref 3.5–5.1)
SODIUM: 138 mmol/L (ref 135–145)
Total Bilirubin: 0.9 mg/dL (ref 0.3–1.2)
Total Protein: 7.4 g/dL (ref 6.5–8.1)

## 2017-05-11 LAB — CBC
HCT: 36.4 % (ref 35.0–47.0)
HEMOGLOBIN: 12.1 g/dL (ref 12.0–16.0)
MCH: 30.7 pg (ref 26.0–34.0)
MCHC: 33.2 g/dL (ref 32.0–36.0)
MCV: 92.3 fL (ref 80.0–100.0)
Platelets: 140 10*3/uL — ABNORMAL LOW (ref 150–440)
RBC: 3.94 MIL/uL (ref 3.80–5.20)
RDW: 13.4 % (ref 11.5–14.5)
WBC: 2.8 10*3/uL — AB (ref 3.6–11.0)

## 2017-05-11 LAB — URINALYSIS, COMPLETE (UACMP) WITH MICROSCOPIC
BILIRUBIN URINE: NEGATIVE
Bacteria, UA: NONE SEEN
GLUCOSE, UA: NEGATIVE mg/dL
KETONES UR: 5 mg/dL — AB
Nitrite: NEGATIVE
PROTEIN: 30 mg/dL — AB
Specific Gravity, Urine: 1.01 (ref 1.005–1.030)
pH: 7 (ref 5.0–8.0)

## 2017-05-11 LAB — LIPASE, BLOOD: LIPASE: 32 U/L (ref 11–51)

## 2017-05-11 LAB — TSH: TSH: 123.387 u[IU]/mL — ABNORMAL HIGH (ref 0.350–4.500)

## 2017-05-11 NOTE — ED Provider Notes (Addendum)
[facility] Emergency Department Provider Note  ____________________________________________   I have reviewed the triage vital signs and the nursing notes.   HISTORY  Chief Complaint Abdominal Pain    HPI Lori Pacheco is a 83 y.o. female who states she has had abdominal pain "for years". She states no acute or figured outpatient has a history of recurrent chronic diarrhea and low white count, she states that when she gets constipation takes MiraLAX or milk of magnesia and has bowel movements. This happened recently. She fell across pinna, she took a laxative, and then she had loose stools. Today however she is not had a bowel movement. That ended last night. She denies any fever or chills. This is a chronic lower abdominal pain when the right than the left not made worse by anything she can think of,, patient does eat and drink with no difficulty, she has had no fevers or chills no dysuria no urinary frequency, she just has chronic recurrent abdominal pain the context of constipation.  She wants to "figure it out" today. She declines offered pain medication no further exhalation of patient's symptoms is available. This poorly described abdominal discomfort  Past Medical History:  Diagnosis Date  . Hyperlipidemia   . Hypertension   . Thyroid disease     Patient Active Problem List   Diagnosis Date Noted  . Atherosclerosis of abdominal aorta ([place]) 11/10/2016  . Other neutropenia ([place]) 11/10/2016  . Perennial allergic rhinitis with seasonal variation 11/10/2016  . Vitamin D deficiency 03/29/2016  . CKD (chronic kidney disease), stage III 09/18/2015  . Hypothyroidism 09/18/2015  . Essential hypertension 05/19/2015  . Hyperlipemia 05/19/2015    Past Surgical History:  Procedure Laterality Date  . NO PAST SURGERIES      Prior to Admission medications   Medication Sig Start Date End Date Taking? Authorizing Provider  amLODipine (NORVASC) 5 MG tablet  Take 1 tablet (5 mg total) by mouth daily. 03/28/17  Yes Steele Sizer, MD  aspirin EC 81 MG tablet Take 1 tablet (81 mg total) by mouth daily. 11/10/16  Yes Sowles, Drue Stager, MD  levothyroxine (SYNTHROID, LEVOTHROID) 150 MCG tablet Take 1 tablet (150 mcg total) by mouth daily. 09/27/16  Yes Sowles, Drue Stager, MD  lisinopril (PRINIVIL,ZESTRIL) 20 MG tablet Take 1 tablet (20 mg total) by mouth daily. 03/28/17  Yes Sowles, Drue Stager, MD  rOPINIRole (REQUIP) 0.5 MG tablet Take 0.5-1 tablets (0.25-0.5 mg total) by mouth at bedtime. 03/28/17  Yes Sowles, Drue Stager, MD  Vitamin D, Ergocalciferol, (DRISDOL) 50000 units CAPS capsule Take 1 capsule (50,000 Units total) by mouth every 7 (seven) days. 03/28/17  Yes Sowles, Drue Stager, MD  loratadine (CLARITIN) 10 MG tablet Take 1 tablet (10 mg total) by mouth daily. 11/10/16   Steele Sizer, MD  Olopatadine HCl 0.2 % SOLN Apply 1 drop to eye daily. 11/10/16   Steele Sizer, MD    Allergies Patient has no known allergies.  Family History  Problem Relation Age of Onset  . Heart disease Brother   . Cancer Daughter   . Diabetes Brother   . Alcohol abuse Brother     Social History Social History  Substance Use Topics  . Smoking status: Never Smoker  . Smokeless tobacco: Former Systems developer    Types: Snuff    Quit date: 09/27/1986  . Alcohol use No    Review of Systems Constitutional: No fever/chills Eyes: No visual changes. ENT: No sore throat. No stiff neck no neck pain Cardiovascular: Denies chest pain.  Respiratory: Denies shortness of breath. Gastrointestinal:   no vomiting.  No diarrhea.  No constipation. Genitourinary: Negative for dysuria. Musculoskeletal: Negative lower extremity swelling Skin: Negative for rash. Neurological: Negative for severe headaches, focal weakness or numbness.   ____________________________________________   PHYSICAL EXAM:  VITAL SIGNS: ED Triage Vitals  Enc Vitals Group     BP 05/11/17 1404 (!) 157/98     Pulse Rate  05/11/17 1404 93     Resp 05/11/17 1404 18     Temp 05/11/17 1404 99.1 F (37.3 C)     Temp Source 05/11/17 1404 Oral     SpO2 05/11/17 1404 98 %     Weight 05/11/17 1409 127 lb (57.6 kg)     Height --      Head Circumference --      Peak Flow --      Pain Score --      Pain Loc --      Pain Edu? --      Excl. in [place]? --     Constitutional: Alert and oriented. Well appearing and in no acute distress. Eyes: Conjunctivae are normal Head: Atraumatic HEENT: No congestion/rhinnorhea. Mucous membranes are moist.  Oropharynx non-erythematous Neck:   Nontender with no meningismus, no masses, no stridor Cardiovascular: Normal rate, regular rhythm. Grossly normal heart sounds.  Good peripheral circulation. Respiratory: Normal respiratory effort.  No retractions. Lungs CTAB. Abdominal: Soft and Minimal distractible lower abdominal tenderness. No distention. No guarding no rebound Back:  There is no focal tenderness or step off.  there is no midline tenderness there are no lesions noted. there is no CVA tenderness Musculoskeletal: No lower extremity tenderness, no upper extremity tenderness. No joint effusions, no DVT signs strong distal pulses no edema Neurologic:  Normal speech and language. No gross focal neurologic deficits are appreciated.  Skin:  Skin is warm, dry and intact. No rash noted. Psychiatric: Mood and affect are normal. Speech and behavior are normal.  ____________________________________________   LABS (all labs ordered are listed, but only abnormal results are displayed)  Labs Reviewed  COMPREHENSIVE METABOLIC PANEL - Abnormal; Notable for the following:       Result Value   Glucose, Bld 120 (*)    BUN 24 (*)    Creatinine, Ser 1.55 (*)    GFR calc non Af Amer 27 (*)    GFR calc Af Amer 32 (*)    All other components within normal limits  CBC - Abnormal; Notable for the following:    WBC 2.8 (*)    Platelets 140 (*)    All other components within normal limits   LIPASE, BLOOD  URINALYSIS, COMPLETE (UACMP) WITH MICROSCOPIC   ____________________________________________  EKG  I personally interpreted any EKGs ordered by me or triage  ____________________________________________  RADIOLOGY  I reviewed any imaging ordered by me or triage that were performed during my shift and, if possible, patient and/or family made aware of any abnormal findings. ____________________________________________   PROCEDURES  Procedure(s) performed: None  Procedures  Critical Care performed: None  ____________________________________________   INITIAL IMPRESSION / ASSESSMENT AND PLAN / ED COURSE  Pertinent labs & imaging results that were available during my care of the patient were reviewed by me and considered in my medical decision making (see chart for details).  Patient with lower abdominal discomfort off and on for many years, she states she actually has pain every day for years. This makes it somewhat less likely that I'll be able to exactly  get the cause. I suspect he has some medial chronic recurrent constipation which is a pattern for the patient. I will however obtain a CT scan as a precaution. We will also check urinalysis and thyroid. The rest of her blood work is reassuring as expected. White count was 2.8 but this is chronic for the patient.  ----------------------------------------- 6:10 PM on 05/11/2017 -----------------------------------------  Pt in nad w/ no abd pain. No abdominal tenderness, tolerating by mouth, family states that she forgot to take all of her medications including her muscle relaxers this morning and they think is why she is having increased discomfort generally speaking. They do not wish to stay in the hospital. There is nothing to suggest ischemic gut, all of her findings are chronic. She does have very chronic hypothyroid, it seems to be worse than it was the last time was checked, she has had a reduction in her  thyroid medication because it did actually get too high at 1.. I have advised him to go back to taking 150 g of Synthroid and see their doctor tomorrow. I have paged her doctor but they have not called back. Family are eager to go home. Patient is asymptomatic in all respects her lower abdominal pain is gone and in any event it is been there for years. They will take her blood pressure medication when she gets home they do not wish any further stay or Hospital attention at this time and they're eager to go. Return precautions and follow-up given and understood.  ----------------------------------------- 6:27 PM on 05/11/2017 -----------------------------------------  Discussed with Dr. Ancil Boozer , we discussed all of her findings including examination imaging and blood work etc., who agrees with discharge, management, and will see the patient tomorrow    ____________________________________________   FINAL CLINICAL IMPRESSION(S) / ED DIAGNOSES  Final diagnoses:  None      This chart was dictated using voice recognition software.  Despite best efforts to proofread,  errors can occur which can change meaning.      Schuyler Amor, MD 05/11/17 1622    Schuyler Amor, MD 05/11/17 3794    Schuyler Amor, MD 05/11/17 [phone]

## 2017-05-11 NOTE — ED Triage Notes (Signed)
Pt reports recurrent RLQ abdominal pain that began last night. Pt reports nausea but denies vomiting. Pt reports takes laxatives.

## 2017-05-11 NOTE — ED Notes (Signed)
MD in room to discuss results and possible discharge.  Will continue to monitor.

## 2017-05-11 NOTE — ED Notes (Signed)
Patient reports she did not take her blood pressure medication this morning.

## 2017-05-11 NOTE — Discharge Instructions (Signed)
Return to emergency room for any new or worrisome symptoms. Her blood pressure was somewhat elevated here, please follow closely with her doctor for recheck tomorrow, there is any chest pain, abdominal pain, fever, chills, or any other concerns please return to the emergency department. Otherwise, follow closely with her primary care doctor tomorrow without fail. Do not like to take blood pressure and other medications.

## 2017-05-11 NOTE — ED Notes (Signed)
Patient reports pain to her left side radiating down her legs both last night and this am.  Patient also reports pain radiated across her lower abdomen.  Patient is in no obvious distress at this time.

## 2017-05-15 ENCOUNTER — Ambulatory Visit (INDEPENDENT_AMBULATORY_CARE_PROVIDER_SITE_OTHER): Payer: Medicare Other | Admitting: Family Medicine

## 2017-05-15 ENCOUNTER — Encounter: Payer: Self-pay | Admitting: Family Medicine

## 2017-05-15 VITALS — BP 136/74 | HR 88 | Temp 98.5°F | Resp 16 | Ht 61.0 in | Wt 127.4 lb

## 2017-05-15 DIAGNOSIS — R109 Unspecified abdominal pain: Secondary | ICD-10-CM | POA: Diagnosis not present

## 2017-05-15 DIAGNOSIS — I700 Atherosclerosis of aorta: Secondary | ICD-10-CM

## 2017-05-15 DIAGNOSIS — N184 Chronic kidney disease, stage 4 (severe): Secondary | ICD-10-CM | POA: Diagnosis not present

## 2017-05-15 DIAGNOSIS — E039 Hypothyroidism, unspecified: Secondary | ICD-10-CM

## 2017-05-15 DIAGNOSIS — D696 Thrombocytopenia, unspecified: Secondary | ICD-10-CM | POA: Diagnosis not present

## 2017-05-15 DIAGNOSIS — N2581 Secondary hyperparathyroidism of renal origin: Secondary | ICD-10-CM | POA: Diagnosis not present

## 2017-05-15 NOTE — Progress Notes (Addendum)
Name: Lori Pacheco   MRN: [id number]    DOB: [birth year]   Date:05/15/2017       Progress Note  Subjective  Chief Complaint  Chief Complaint  Patient presents with  . Follow-up    When to Hospital on 05/11/17 for being sick and her side still bothering her. ER doctor thinks it could be contributed to her TSH level being elevated.   . Abdominal Pain    HPI  CKI and secondary hyperparathyroidism: she has noticed pruritis , normal urine volume, never seen by nephrologist in the past, but now that she is feeling weaker and having pruritus she is willing to see nephrologist.   Abdominal pain: going on for the past 3 years, had multiple trips to [facility], last visit was 05/11/2017.  Negative [place] and CT abdomen, it is usually in the mornings, seems to be more on the  right side of abdomen, cramping like and sometimes stabbing, does not seem to be related with meals. At times  resolves by itself or sometimes with tylenol or Advil prn. Not associated with nausea or vomiting no blood in stools.  Over the past week she has not been feeling as hungry. EC visit showed low WBC, negative lipase and kidney function is now worse.   Thrombocytopenia: during visit to [facility] on 05/11/2017. She has noticed that it lasts a while to stop bleeding, but resolves when she applies pressure to the site  Hypothyroidism: she is taking Synthroid 150 mcg daily over the past 3 days  no diarrhea or palpitation. She has episodes of fatigue.    Patient Active Problem List   Diagnosis Date Noted  . Thrombocytopenia ([place]) 05/15/2017  . Atherosclerosis of abdominal aorta ([place]) 11/10/2016  . Other neutropenia ([place]) 11/10/2016  . Perennial allergic rhinitis with seasonal variation 11/10/2016  . Vitamin D deficiency 03/29/2016  . CKD (chronic kidney disease), stage III 09/18/2015  . Hypothyroidism 09/18/2015  . Essential hypertension 05/19/2015  . Hyperlipemia 05/19/2015    Past Surgical History:  Procedure Laterality Date  .  NO PAST SURGERIES      Family History  Problem Relation Age of Onset  . Heart disease Brother   . Cancer Daughter   . Diabetes Brother   . Alcohol abuse Brother     Social History   Social History  . Marital status: Married    Spouse name: N/A  . Number of children: N/A  . Years of education: N/A   Occupational History  . Not on file.   Social History Main Topics  . Smoking status: Never Smoker  . Smokeless tobacco: Former Systems developer    Types: Snuff    Quit date: 09/27/1986  . Alcohol use No  . Drug use: No  . Sexual activity: No   Other Topics Concern  . Not on file   Social History Narrative   Lives with one daughter, still rakes her yard, cuts wood, feeds her chicken, cleans her house and cooks.      Current Outpatient Prescriptions:  .  amLODipine (NORVASC) 5 MG tablet, Take 1 tablet (5 mg total) by mouth daily., Disp: 90 tablet, Rfl: 1 .  aspirin EC 81 MG tablet, Take 1 tablet (81 mg total) by mouth daily., Disp: 30 tablet, Rfl: 0 .  levothyroxine (SYNTHROID, LEVOTHROID) 150 MCG tablet, Take 1 tablet (150 mcg total) by mouth daily., Disp: 90 tablet, Rfl: 0 .  lisinopril (PRINIVIL,ZESTRIL) 20 MG tablet, Take 1 tablet (20 mg total) by mouth daily., Disp:  90 tablet, Rfl: 1 .  Olopatadine HCl 0.2 % SOLN, Apply 1 drop to eye daily. (Patient taking differently: Apply 1 drop to eye as needed. ), Disp: 2.5 mL, Rfl: 2 .  rOPINIRole (REQUIP) 0.5 MG tablet, Take 0.5-1 tablets (0.25-0.5 mg total) by mouth at bedtime., Disp: 90 tablet, Rfl: 1 .  Vitamin D, Ergocalciferol, (DRISDOL) 50000 units CAPS capsule, Take 1 capsule (50,000 Units total) by mouth every 7 (seven) days., Disp: 12 capsule, Rfl: 1 .  loratadine (CLARITIN) 10 MG tablet, Take 1 tablet (10 mg total) by mouth daily. (Patient not taking: Reported on 05/15/2017), Disp: 30 tablet, Rfl: 11  No Known Allergies   ROS  Constitutional: Negative for fever or weight change.  Respiratory: Negative for cough and shortness of  breath.   Cardiovascular: Negative for chest pain or palpitations.  Gastrointestinal: Positive  for abdominal pain, no bowel changes ( chronic constipation) .  Musculoskeletal: Positive  for gait problem, no  joint swelling.  Skin: Negative for rash.  Neurological: Negative for dizziness or headache.  She has also noticed leg cramps, intermittent pruritis, feels tired, legs not feeling as strong, decrease in appetite  Objective  Vitals:   05/15/17 0827  BP: 136/74  Pulse: 88  Resp: 16  Temp: 98.5 F (36.9 C)  TempSrc: Oral  SpO2: 95%  Weight: 127 lb 6.4 oz (57.8 kg)  Height: '5\' 1"'  (1.549 m)    Body mass index is 24.07 kg/m.  Physical Exam  Constitutional: Patient appears well-developed No distress.  HEENT: head atraumatic, normocephalic, pupils equal and reactive to light,neck supple, throat within normal limits Cardiovascular: Normal rate, regular rhythm and normal heart sounds.  No murmur heard. No BLE edema. Pulmonary/Chest: Effort normal and breath sounds normal. No respiratory distress. Abdominal: Soft.  There is no tenderness. Cord felt on right colon, normal bowel sounds Psychiatric: Patient has a normal mood and affect. behavior is normal. Judgment and thought content normal.  Recent Results (from the past 2160 hour(s))  Ferritin     Status: None   Collection Time: 03/28/17  9:10 AM  Result Value Ref Range   Ferritin 87 20 - 288 ng/mL  TSH     Status: Abnormal   Collection Time: 03/28/17  9:34 AM  Result Value Ref Range   TSH 36.67 (H) mIU/L    Comment:   Reference Range   > or = 20 Years  0.40-4.50   Pregnancy Range First trimester  0.26-2.66 Second trimester 0.55-2.73 Third trimester  0.43-2.91     Parathyroid hormone, intact (no Ca)     Status: Abnormal   Collection Time: 03/28/17  9:34 AM  Result Value Ref Range   PTH 150 (H) 14 - 64 pg/mL    Comment:   Interpretive Guide:                              Intact PTH               Calcium                               ----------               ------- Normal Parathyroid           Normal                   Normal Hypoparathyroidism  Low or Low Normal        Low Hyperparathyroidism      Primary                 Normal or High           High      Secondary               High                     Normal or Low      Tertiary                High                     High Non-Parathyroid   Hypercalcemia              Low or Low Normal        High   Lipid panel     Status: Abnormal   Collection Time: 03/28/17  9:34 AM  Result Value Ref Range   Cholesterol 234 (H) <200 mg/dL   Triglycerides 70 <150 mg/dL   HDL 76 >50 mg/dL   Total CHOL/HDL Ratio 3.1 <5.0 Ratio   VLDL 14 <30 mg/dL   LDL Cholesterol 144 (H) <100 mg/dL  Hemoglobin A1c     Status: Abnormal   Collection Time: 03/28/17  9:34 AM  Result Value Ref Range   Hgb A1c MFr Bld 5.7 (H) <5.7 %    Comment:   For someone without known diabetes, a hemoglobin A1c value between 5.7% and 6.4% is consistent with prediabetes and should be confirmed with a follow-up test.   For someone with known diabetes, a value <7% indicates that their diabetes is well controlled. A1c targets should be individualized based on duration of diabetes, age, co-morbid conditions and other considerations.   This assay result is consistent with an increased risk of diabetes.   Currently, no consensus exists regarding use of hemoglobin A1c for diagnosis of diabetes in children.      Mean Plasma Glucose 117 mg/dL  COMPLETE METABOLIC PANEL WITH GFR     Status: Abnormal   Collection Time: 03/28/17  9:34 AM  Result Value Ref Range   Sodium 140 135 - 146 mmol/L   Potassium 4.5 3.5 - 5.3 mmol/L   Chloride 104 98 - 110 mmol/L   CO2 27 20 - 31 mmol/L   Glucose, Bld 92 65 - 99 mg/dL   BUN 23 7 - 25 mg/dL   Creat 1.41 (H) 0.60 - 0.88 mg/dL    Comment:   For patients > or = 81 years of age: The upper reference limit for Creatinine is approximately 13%  higher for people identified as African-American.      Total Bilirubin 0.7 0.2 - 1.2 mg/dL   Alkaline Phosphatase 49 33 - 130 U/L   AST 17 10 - 35 U/L   ALT 11 6 - 29 U/L   Total Protein 7.1 6.1 - 8.1 g/dL   Albumin 4.1 3.6 - 5.1 g/dL   Calcium 9.3 8.6 - 10.4 mg/dL   GFR, Est African American 37 (L) >=60 mL/min   GFR, Est Non African American 32 (L) >=60 mL/min  CBC with Differential/Platelet     Status: Abnormal   Collection Time: 03/28/17  9:34 AM  Result Value Ref Range   WBC 2.2 (L) 3.8 - 10.8 K/uL   RBC 3.91 3.80 -  5.10 MIL/uL   Hemoglobin 11.7 11.7 - 15.5 g/dL   HCT 37.2 35.0 - 45.0 %   MCV 95.1 80.0 - 100.0 fL   MCH 29.9 27.0 - 33.0 pg   MCHC 31.5 (L) 32.0 - 36.0 g/dL   RDW 14.6 11.0 - 15.0 %   Platelets 142 140 - 400 K/uL   MPV 10.4 7.5 - 12.5 fL   Neutro Abs 1,122 (L) 1,500 - 7,800 cells/uL   Lymphs Abs 880 850 - 3,900 cells/uL   Monocytes Absolute 132 (L) 200 - 950 cells/uL   Eosinophils Absolute 44 15 - 500 cells/uL   Basophils Absolute 22 0 - 200 cells/uL   Neutrophils Relative % 51 %   Lymphocytes Relative 40 %   Monocytes Relative 6 %   Eosinophils Relative 2 %   Basophils Relative 1 %   Smear Review Criteria for review not met   Lipase, blood     Status: None   Collection Time: 05/11/17  2:06 PM  Result Value Ref Range   Lipase 32 11 - 51 U/L  Comprehensive metabolic panel     Status: Abnormal   Collection Time: 05/11/17  2:06 PM  Result Value Ref Range   Sodium 138 135 - 145 mmol/L   Potassium 4.6 3.5 - 5.1 mmol/L   Chloride 106 101 - 111 mmol/L   CO2 25 22 - 32 mmol/L   Glucose, Bld 120 (H) 65 - 99 mg/dL   BUN 24 (H) 6 - 20 mg/dL   Creatinine, Ser 1.55 (H) 0.44 - 1.00 mg/dL   Calcium 9.5 8.9 - 10.3 mg/dL   Total Protein 7.4 6.5 - 8.1 g/dL   Albumin 4.3 3.5 - 5.0 g/dL   AST 26 15 - 41 U/L   ALT 14 14 - 54 U/L   Alkaline Phosphatase 41 38 - 126 U/L   Total Bilirubin 0.9 0.3 - 1.2 mg/dL   GFR calc non Af Amer 27 (L) >60 mL/min   GFR calc Af  Amer 32 (L) >60 mL/min    Comment: (NOTE) The eGFR has been calculated using the CKD EPI equation. This calculation has not been validated in all clinical situations. eGFR's persistently <60 mL/min signify possible Chronic Kidney Disease.    Anion gap 7 5 - 15  CBC     Status: Abnormal   Collection Time: 05/11/17  2:06 PM  Result Value Ref Range   WBC 2.8 (L) 3.6 - 11.0 K/uL   RBC 3.94 3.80 - 5.20 MIL/uL   Hemoglobin 12.1 12.0 - 16.0 g/dL   HCT 36.4 35.0 - 47.0 %   MCV 92.3 80.0 - 100.0 fL   MCH 30.7 26.0 - 34.0 pg   MCHC 33.2 32.0 - 36.0 g/dL   RDW 13.4 11.5 - 14.5 %   Platelets 140 (L) 150 - 440 K/uL  TSH     Status: Abnormal   Collection Time: 05/11/17  2:06 PM  Result Value Ref Range   TSH 123.387 (H) 0.350 - 4.500 uIU/mL    Comment: Performed by a 3rd Generation assay with a functional sensitivity of <=0.01 uIU/mL.  Urinalysis, Complete w Microscopic     Status: Abnormal   Collection Time: 05/11/17  5:05 PM  Result Value Ref Range   Color, Urine YELLOW (A) YELLOW   APPearance CLEAR (A) CLEAR   Specific Gravity, Urine 1.010 1.005 - 1.030   pH 7.0 5.0 - 8.0   Glucose, UA NEGATIVE NEGATIVE mg/dL   Hgb urine dipstick  SMALL (A) NEGATIVE   Bilirubin Urine NEGATIVE NEGATIVE   Ketones, ur 5 (A) NEGATIVE mg/dL   Protein, ur 30 (A) NEGATIVE mg/dL   Nitrite NEGATIVE NEGATIVE   Leukocytes, UA TRACE (A) NEGATIVE   RBC / HPF 0-5 0 - 5 RBC/hpf   WBC, UA 0-5 0 - 5 WBC/hpf   Bacteria, UA NONE SEEN NONE SEEN   Squamous Epithelial / LPF 0-5 (A) NONE SEEN   Mucous PRESENT      PHQ2/9: Depression screen [facility] 2/9 03/28/2017 12/19/2016 11/10/2016 09/27/2016 03/28/2016  Decreased Interest 0 0 0 0 0  Down, Depressed, Hopeless 0 0 0 0 0  PHQ - 2 Score 0 0 0 0 0     Fall Risk: Fall Risk  03/28/2017 12/19/2016 11/10/2016 09/27/2016 03/28/2016  Falls in the past year? Yes Yes No No Yes  Number falls in past yr: 2 or more 2 or more - - 2 or more  Injury with Fall? No No - - No  Risk for fall  due to : - Other (Comment) - - -  Risk for fall due to (comments): - "due to getting in a hurry" - - -  Follow up - Falls prevention discussed - - Falls evaluation completed     Assessment & Plan  1. Hypothyroidism, unspecified type  She had stopped taking medication  Months ago, but daughter is dispensing the medication on weekly pill containers and she has been compliant past few days. She has noticed dry skin and fatigue. Explained importance of compliance with medication   2. Thrombocytopenia ([place])  We will recheck in 6 weeks during follow up  3. Chronic kidney disease, stage IV (severe) ([place])  - Ambulatory referral to Nephrology  4. Intermittent abdominal pain  No pain at this time, had evaluation at [facility]  5. Secondary renal hyperparathyroidism ([place])  Needs to see nephrologist   6. Atherosclerosis of abdominal aorta ([place])  She has never been on statin therapy, discussed with patient and her daughter and they would like to hold off on it for now

## 2017-06-08 ENCOUNTER — Encounter: Payer: Medicare Other | Admitting: Family Medicine

## 2017-06-26 ENCOUNTER — Ambulatory Visit (INDEPENDENT_AMBULATORY_CARE_PROVIDER_SITE_OTHER): Payer: Medicare Other | Admitting: Family Medicine

## 2017-06-26 ENCOUNTER — Encounter: Payer: Self-pay | Admitting: Family Medicine

## 2017-06-26 VITALS — BP 148/80 | HR 86 | Temp 98.2°F | Resp 16 | Ht 61.0 in | Wt 117.6 lb

## 2017-06-26 DIAGNOSIS — E039 Hypothyroidism, unspecified: Secondary | ICD-10-CM

## 2017-06-26 DIAGNOSIS — N183 Chronic kidney disease, stage 3 unspecified: Secondary | ICD-10-CM

## 2017-06-26 DIAGNOSIS — K5909 Other constipation: Secondary | ICD-10-CM | POA: Diagnosis not present

## 2017-06-26 DIAGNOSIS — Z23 Encounter for immunization: Secondary | ICD-10-CM

## 2017-06-26 DIAGNOSIS — M545 Low back pain, unspecified: Secondary | ICD-10-CM

## 2017-06-26 DIAGNOSIS — N2581 Secondary hyperparathyroidism of renal origin: Secondary | ICD-10-CM

## 2017-06-26 DIAGNOSIS — I10 Essential (primary) hypertension: Secondary | ICD-10-CM

## 2017-06-26 DIAGNOSIS — D696 Thrombocytopenia, unspecified: Secondary | ICD-10-CM | POA: Diagnosis not present

## 2017-06-26 DIAGNOSIS — G8929 Other chronic pain: Secondary | ICD-10-CM | POA: Diagnosis not present

## 2017-06-26 MED ORDER — LUBIPROSTONE 8 MCG PO CAPS: 8.0000 ug | ORAL_CAPSULE | Freq: Two times a day (BID) | ORAL | 0 refills | Status: DC

## 2017-06-26 MED ORDER — VITAMIN D 50 MCG (2000 UT) PO CAPS: 1.0000 | ORAL_CAPSULE | Freq: Every day | ORAL | 0 refills | Status: DC

## 2017-06-26 MED ORDER — GABAPENTIN 100 MG PO CAPS: 100.0000 mg | ORAL_CAPSULE | Freq: Three times a day (TID) | ORAL | 0 refills | Status: DC

## 2017-06-26 NOTE — Progress Notes (Signed)
Name: Lori Pacheco   MRN: [id number]    DOB: 1921-08-31   Date:06/26/2017       Progress Note  Subjective  Chief Complaint  Chief Complaint  Patient presents with  . Follow-up    6 week F/U   . Weight Loss    Patient has had no appetite and has losted 10 pds since last visit  . Hypertension  . Hypothyroidism    HPI  HTN/CKI stage III: seeing Dr. Holley Raring, she had some repeat labs and kidney US. BP is stable in the 140's. Daughter states she has been compliant with medications over the past month. She denies chest pain or palpitation  Thrombocytopenia/leukopenia: we will recheck levels and if needed refer to hematologist   Hypothyroidism: she is now taking Synthroid 150 mcg daily for the past 6 weeks, TSH was extremity high over 100 back in July, she lost 10 lbs since started taking medication daily , we will recheck levels. Denies diarrhea but has noticed intermittent palpitation  Chronic low back pain/constipation: it affects her gait, not as active as she used to because of low back pain that radiates to right lower quadrant, pain seems chronic had CT pelvix in 2016 and 2018, she has atherosclerosis but no masses. We will treat for nerve pain and constipation and see if symptoms improves   Patient Active Problem List   Diagnosis Date Noted  . Thrombocytopenia ([place]) 05/15/2017  . Secondary renal hyperparathyroidism ([place]) 05/15/2017  . Atherosclerosis of abdominal aorta ([place]) 11/10/2016  . Other neutropenia ([place]) 11/10/2016  . Perennial allergic rhinitis with seasonal variation 11/10/2016  . Vitamin D deficiency 03/29/2016  . Hypothyroidism 09/18/2015  . Essential hypertension 05/19/2015  . Hyperlipemia 05/19/2015  . Chronic kidney disease, stage IV (severe) ([place]) 05/19/2015    Past Surgical History:  Procedure Laterality Date  . NO PAST SURGERIES      Family History  Problem Relation Age of Onset  . Heart disease Brother   . Cancer Daughter   . Diabetes Brother   .  Alcohol abuse Brother     Social History   Social History  . Marital status: Married    Spouse name: N/A  . Number of children: N/A  . Years of education: N/A   Occupational History  . Not on file.   Social History Main Topics  . Smoking status: Never Smoker  . Smokeless tobacco: Former Systems developer    Types: Snuff    Quit date: 09/27/1986  . Alcohol use No  . Drug use: No  . Sexual activity: No   Other Topics Concern  . Not on file   Social History Narrative   Lives with one daughter, she used to be very active, raking her yard, cooking, cleaning, however over the past month she states she hurts all over and cannot do it anymore.      Current Outpatient Prescriptions:  .  amLODipine (NORVASC) 5 MG tablet, Take 1 tablet (5 mg total) by mouth daily., Disp: 90 tablet, Rfl: 1 .  levothyroxine (SYNTHROID, LEVOTHROID) 150 MCG tablet, Take 1 tablet (150 mcg total) by mouth daily., Disp: 90 tablet, Rfl: 0 .  lisinopril (PRINIVIL,ZESTRIL) 20 MG tablet, Take 1 tablet (20 mg total) by mouth daily., Disp: 90 tablet, Rfl: 1 .  loratadine (CLARITIN) 10 MG tablet, Take 1 tablet (10 mg total) by mouth daily., Disp: 30 tablet, Rfl: 11 .  Olopatadine HCl 0.2 % SOLN, Apply 1 drop to eye daily. (Patient taking differently: Apply 1  drop to eye as needed. ), Disp: 2.5 mL, Rfl: 2 .  rOPINIRole (REQUIP) 0.5 MG tablet, Take 0.5-1 tablets (0.25-0.5 mg total) by mouth at bedtime., Disp: 90 tablet, Rfl: 1 .  Vitamin D, Ergocalciferol, (DRISDOL) 50000 units CAPS capsule, Take 1 capsule (50,000 Units total) by mouth every 7 (seven) days., Disp: 12 capsule, Rfl: 1 .  aspirin EC 81 MG tablet, Take 1 tablet (81 mg total) by mouth daily. (Patient not taking: Reported on 06/26/2017), Disp: 30 tablet, Rfl: 0 .  gabapentin (NEURONTIN) 100 MG capsule, Take 1-3 capsules (100-300 mg total) by mouth 3 (three) times daily. Start at 100 mg and go up by one capsule every 3 days until 300 mg qhs, Disp: 90 capsule, Rfl: 0 .   lubiprostone (AMITIZA) 8 MCG capsule, Take 1 capsule (8 mcg total) by mouth 2 (two) times daily with a meal., Disp: 60 capsule, Rfl: 0  No Known Allergies   ROS  Constitutional: Negative for fever , positive for weight change.  Respiratory: Negative for cough and shortness of breath.   Cardiovascular: Negative for chest pain or palpitations.  Gastrointestinal: Negative for abdominal pain, no bowel changes.  Musculoskeletal: positive for slow gait no  joint swelling.  Skin: Negative for rash.  Neurological: positive  for intermittent dizziness no  headache.  No other specific complaints in a complete review of systems (except as listed in HPI above).  Objective  Vitals:   06/26/17 1457 06/26/17 1512  BP: (!) 162/84 (!) 148/80  Pulse: 86   Resp: 16   Temp: 98.2 F (36.8 C)   TempSrc: Oral   SpO2: 97%   Weight: 117 lb 9.6 oz (53.3 kg)   Height: '5\' 1"'  (1.549 m)     Body mass index is 22.22 kg/m.  Physical Exam  Constitutional: Patient appears well-developed No distress.  HEENT: head atraumatic, normocephalic, pupils equal and reactive to light,neck supple, throat within normal limits Cardiovascular: Normal rate, regular rhythm and normal heart sounds.  No murmur heard. No BLE edema. Pulmonary/Chest: Effort normal and breath sounds normal. No respiratory distress. Abdominal: Soft.  There is no tenderness.  Muscular Skeletal: slow gait, seems to be favoring right side, negative straight leg raise, no tenderness during palpation of spine.  Psychiatric: Patient has a normal mood and affect. behavior is normal. Judgment and thought content normal.  Recent Results (from the past 2160 hour(s))  Lipase, blood     Status: None   Collection Time: 05/11/17  2:06 PM  Result Value Ref Range   Lipase 32 11 - 51 U/L  Comprehensive metabolic panel     Status: Abnormal   Collection Time: 05/11/17  2:06 PM  Result Value Ref Range   Sodium 138 135 - 145 mmol/L   Potassium 4.6 3.5 - 5.1  mmol/L   Chloride 106 101 - 111 mmol/L   CO2 25 22 - 32 mmol/L   Glucose, Bld 120 (H) 65 - 99 mg/dL   BUN 24 (H) 6 - 20 mg/dL   Creatinine, Ser 1.55 (H) 0.44 - 1.00 mg/dL   Calcium 9.5 8.9 - 10.3 mg/dL   Total Protein 7.4 6.5 - 8.1 g/dL   Albumin 4.3 3.5 - 5.0 g/dL   AST 26 15 - 41 U/L   ALT 14 14 - 54 U/L   Alkaline Phosphatase 41 38 - 126 U/L   Total Bilirubin 0.9 0.3 - 1.2 mg/dL   GFR calc non Af Amer 27 (L) >60 mL/min   GFR calc  Af Amer 32 (L) >60 mL/min    Comment: (NOTE) The eGFR has been calculated using the CKD EPI equation. This calculation has not been validated in all clinical situations. eGFR's persistently <60 mL/min signify possible Chronic Kidney Disease.    Anion gap 7 5 - 15  CBC     Status: Abnormal   Collection Time: 05/11/17  2:06 PM  Result Value Ref Range   WBC 2.8 (L) 3.6 - 11.0 K/uL   RBC 3.94 3.80 - 5.20 MIL/uL   Hemoglobin 12.1 12.0 - 16.0 g/dL   HCT 36.4 35.0 - 47.0 %   MCV 92.3 80.0 - 100.0 fL   MCH 30.7 26.0 - 34.0 pg   MCHC 33.2 32.0 - 36.0 g/dL   RDW 13.4 11.5 - 14.5 %   Platelets 140 (L) 150 - 440 K/uL  TSH     Status: Abnormal   Collection Time: 05/11/17  2:06 PM  Result Value Ref Range   TSH 123.387 (H) 0.350 - 4.500 uIU/mL    Comment: Performed by a 3rd Generation assay with a functional sensitivity of <=0.01 uIU/mL.  Urinalysis, Complete w Microscopic     Status: Abnormal   Collection Time: 05/11/17  5:05 PM  Result Value Ref Range   Color, Urine YELLOW (A) YELLOW   APPearance CLEAR (A) CLEAR   Specific Gravity, Urine 1.010 1.005 - 1.030   pH 7.0 5.0 - 8.0   Glucose, UA NEGATIVE NEGATIVE mg/dL   Hgb urine dipstick SMALL (A) NEGATIVE   Bilirubin Urine NEGATIVE NEGATIVE   Ketones, ur 5 (A) NEGATIVE mg/dL   Protein, ur 30 (A) NEGATIVE mg/dL   Nitrite NEGATIVE NEGATIVE   Leukocytes, UA TRACE (A) NEGATIVE   RBC / HPF 0-5 0 - 5 RBC/hpf   WBC, UA 0-5 0 - 5 WBC/hpf   Bacteria, UA NONE SEEN NONE SEEN   Squamous Epithelial / LPF 0-5  (A) NONE SEEN   Mucus PRESENT      PHQ2/9: Depression screen [facility] 2/9 06/26/2017 03/28/2017 12/19/2016 11/10/2016 09/27/2016  Decreased Interest 0 0 0 0 0  Down, Depressed, Hopeless 1 0 0 0 0  PHQ - 2 Score 1 0 0 0 0     Fall Risk: Fall Risk  06/26/2017 03/28/2017 12/19/2016 11/10/2016 09/27/2016  Falls in the past year? Yes Yes Yes No No  Number falls in past yr: 2 or more 2 or more 2 or more - -  Injury with Fall? No No No - -  Risk for fall due to : History of fall(s);Impaired balance/gait - Other (Comment) - -  Risk for fall due to: Comment - - "due to getting in a hurry" - -  Follow up - - Falls prevention discussed - -     Functional Status Survey: Is the patient deaf or have difficulty hearing?: No Does the patient have difficulty seeing, even when wearing glasses/contacts?: No Does the patient have difficulty concentrating, remembering, or making decisions?: No Does the patient have difficulty walking or climbing stairs?: Yes Does the patient have difficulty dressing or bathing?: No Does the patient have difficulty doing errands alone such as visiting a doctor's office or shopping?: Yes   Assessment & Plan  1. Hypothyroidism, unspecified type  Currently taking medication daily and has lost 10 lbs on medication , we will recheck TSH  - TSH  2. Needs flu shot  - Flu vaccine HIGH DOSE PF (Fluzone High dose)  3. Thrombocytopenia (HCC)  - CBC with Differential/Platelet Aspirin  has been on hold   4. Chronic kidney disease, stage IIII [facility])  Seeing nephrologist now  5. Essential hypertension  bp is stable for the past  last few visits it has been in the 140's  6. Secondary renal hyperparathyroidism ([place])  Continue vitamin D and follow up with Nephrologist   7. Chronic bilateral low back pain without sciatica  - gabapentin (NEURONTIN) 100 MG capsule; Take 1-3 capsules (100-300 mg total) by mouth 3 (three) times daily. Start at 100 mg and go up by one capsule every  3 days until 300 mg qhs  Dispense: 90 capsule; Refill: 0  8. Other constipation  - lubiprostone (AMITIZA) 8 MCG capsule; Take 1 capsule (8 mcg total) by mouth 2 (two) times daily with a meal.  Dispense: 60 capsule; Refill: 0

## 2017-06-27 ENCOUNTER — Other Ambulatory Visit: Payer: Self-pay | Admitting: Family Medicine

## 2017-06-27 DIAGNOSIS — D61818 Other pancytopenia: Secondary | ICD-10-CM

## 2017-06-27 DIAGNOSIS — E0590 Thyrotoxicosis, unspecified without thyrotoxic crisis or storm: Secondary | ICD-10-CM

## 2017-06-27 LAB — CBC WITH DIFFERENTIAL/PLATELET
BASOS PCT: 0.4 %
Basophils Absolute: 9 cells/uL (ref 0–200)
EOS PCT: 0.9 %
Eosinophils Absolute: 20 cells/uL (ref 15–500)
HCT: 32 % — ABNORMAL LOW (ref 35.0–45.0)
Hemoglobin: 10.2 g/dL — ABNORMAL LOW (ref 11.7–15.5)
LYMPHS ABS: 946 {cells}/uL (ref 850–3900)
MCH: 28.5 pg (ref 27.0–33.0)
MCHC: 31.9 g/dL — ABNORMAL LOW (ref 32.0–36.0)
MCV: 89.4 fL (ref 80.0–100.0)
MPV: 11.4 fL (ref 7.5–12.5)
Monocytes Relative: 10.3 %
Neutro Abs: 999 cells/uL — ABNORMAL LOW (ref 1500–7800)
Neutrophils Relative %: 45.4 %
PLATELETS: 130 10*3/uL — AB (ref 140–400)
RBC: 3.58 10*6/uL — AB (ref 3.80–5.10)
RDW: 12.5 % (ref 11.0–15.0)
Total Lymphocyte: 43 %
WBC: 2.2 10*3/uL — AB (ref 3.8–10.8)
WBCMIX: 227 {cells}/uL (ref 200–950)

## 2017-06-27 LAB — TSH: TSH: 0.05 m[IU]/L — AB (ref 0.40–4.50)

## 2017-06-27 MED ORDER — LEVOTHYROXINE SODIUM 137 MCG PO TABS
137.0000 ug | ORAL_TABLET | Freq: Every day | ORAL | 0 refills | Status: DC
Start: 2017-06-27 — End: 2017-07-24

## 2017-07-04 ENCOUNTER — Inpatient Hospital Stay: Payer: Medicare Other | Admitting: Oncology

## 2017-07-07 ENCOUNTER — Ambulatory Visit: Payer: Medicare Other | Admitting: Oncology

## 2017-07-13 ENCOUNTER — Encounter: Payer: Self-pay | Admitting: Oncology

## 2017-07-13 ENCOUNTER — Inpatient Hospital Stay: Payer: Medicare Other

## 2017-07-13 ENCOUNTER — Inpatient Hospital Stay: Payer: Medicare Other | Attending: Oncology | Admitting: Oncology

## 2017-07-13 ENCOUNTER — Other Ambulatory Visit: Payer: Self-pay

## 2017-07-13 VITALS — BP 154/85 | HR 86 | Temp 97.0°F | Resp 14 | Wt 110.0 lb

## 2017-07-13 DIAGNOSIS — Z87891 Personal history of nicotine dependence: Secondary | ICD-10-CM | POA: Insufficient documentation

## 2017-07-13 DIAGNOSIS — D61818 Other pancytopenia: Secondary | ICD-10-CM | POA: Insufficient documentation

## 2017-07-13 DIAGNOSIS — E039 Hypothyroidism, unspecified: Secondary | ICD-10-CM | POA: Diagnosis not present

## 2017-07-13 DIAGNOSIS — Z7982 Long term (current) use of aspirin: Secondary | ICD-10-CM | POA: Insufficient documentation

## 2017-07-13 DIAGNOSIS — Z79899 Other long term (current) drug therapy: Secondary | ICD-10-CM | POA: Diagnosis not present

## 2017-07-13 DIAGNOSIS — R634 Abnormal weight loss: Secondary | ICD-10-CM | POA: Diagnosis not present

## 2017-07-13 DIAGNOSIS — I129 Hypertensive chronic kidney disease with stage 1 through stage 4 chronic kidney disease, or unspecified chronic kidney disease: Secondary | ICD-10-CM | POA: Insufficient documentation

## 2017-07-13 DIAGNOSIS — K5909 Other constipation: Secondary | ICD-10-CM | POA: Diagnosis not present

## 2017-07-13 DIAGNOSIS — E785 Hyperlipidemia, unspecified: Secondary | ICD-10-CM | POA: Insufficient documentation

## 2017-07-13 DIAGNOSIS — D696 Thrombocytopenia, unspecified: Secondary | ICD-10-CM

## 2017-07-13 DIAGNOSIS — D649 Anemia, unspecified: Secondary | ICD-10-CM

## 2017-07-13 DIAGNOSIS — N184 Chronic kidney disease, stage 4 (severe): Secondary | ICD-10-CM

## 2017-07-13 DIAGNOSIS — D708 Other neutropenia: Secondary | ICD-10-CM

## 2017-07-13 DIAGNOSIS — Z809 Family history of malignant neoplasm, unspecified: Secondary | ICD-10-CM | POA: Diagnosis not present

## 2017-07-13 DIAGNOSIS — R531 Weakness: Secondary | ICD-10-CM | POA: Diagnosis not present

## 2017-07-13 DIAGNOSIS — D709 Neutropenia, unspecified: Secondary | ICD-10-CM | POA: Diagnosis not present

## 2017-07-13 LAB — IRON AND TIBC
IRON: 61 ug/dL (ref 28–170)
Saturation Ratios: 21 % (ref 10.4–31.8)
TIBC: 292 ug/dL (ref 250–450)
UIBC: 231 ug/dL

## 2017-07-13 LAB — FERRITIN: Ferritin: 240 ng/mL (ref 11–307)

## 2017-07-13 LAB — CBC WITH DIFFERENTIAL/PLATELET
BASOS ABS: 0 10*3/uL (ref 0–0.1)
BASOS PCT: 1 %
EOS ABS: 0 10*3/uL (ref 0–0.7)
Eosinophils Relative: 1 %
HEMATOCRIT: 31.1 % — AB (ref 35.0–47.0)
Hemoglobin: 10.5 g/dL — ABNORMAL LOW (ref 12.0–16.0)
Lymphocytes Relative: 41 %
Lymphs Abs: 1 10*3/uL (ref 1.0–3.6)
MCH: 29.6 pg (ref 26.0–34.0)
MCHC: 33.7 g/dL (ref 32.0–36.0)
MCV: 87.7 fL (ref 80.0–100.0)
MONO ABS: 0.2 10*3/uL (ref 0.2–0.9)
MONOS PCT: 10 %
NEUTROS ABS: 1.2 10*3/uL — AB (ref 1.4–6.5)
NEUTROS PCT: 49 %
Platelets: 129 10*3/uL — ABNORMAL LOW (ref 150–440)
RBC: 3.54 MIL/uL — ABNORMAL LOW (ref 3.80–5.20)
RDW: 13.2 % (ref 11.5–14.5)
WBC: 2.4 10*3/uL — ABNORMAL LOW (ref 3.6–11.0)

## 2017-07-13 LAB — COMPREHENSIVE METABOLIC PANEL
ALBUMIN: 4.1 g/dL (ref 3.5–5.0)
ALT: 12 U/L — ABNORMAL LOW (ref 14–54)
ANION GAP: 8 (ref 5–15)
AST: 21 U/L (ref 15–41)
Alkaline Phosphatase: 48 U/L (ref 38–126)
BUN: 43 mg/dL — ABNORMAL HIGH (ref 6–20)
CALCIUM: 10.1 mg/dL (ref 8.9–10.3)
CHLORIDE: 105 mmol/L (ref 101–111)
CO2: 27 mmol/L (ref 22–32)
Creatinine, Ser: 1.5 mg/dL — ABNORMAL HIGH (ref 0.44–1.00)
GFR calc Af Amer: 33 mL/min — ABNORMAL LOW (ref >60)
GFR calc non Af Amer: 29 mL/min — ABNORMAL LOW (ref >60)
Glucose, Bld: 103 mg/dL — ABNORMAL HIGH (ref 65–99)
POTASSIUM: 5 mmol/L (ref 3.5–5.1)
SODIUM: 140 mmol/L (ref 135–145)
TOTAL PROTEIN: 7.5 g/dL (ref 6.5–8.1)
Total Bilirubin: 0.7 mg/dL (ref 0.3–1.2)

## 2017-07-13 LAB — LACTATE DEHYDROGENASE: LDH: 195 U/L — AB (ref 98–192)

## 2017-07-13 NOTE — Progress Notes (Signed)
Hematology/Oncology Consult note [facility] [phone] [phone]  CONSULT NOTE Patient Care Team: Steele Sizer, MD as PCP - General (Family Medicine) Anthonette Legato, MD as Consulting Physician (Nephrology)   Referring Physician: Herold Harms Drue Stager  CHIEF COMPLAINTS/PURPOSE OF CONSULTATION:  Pancytopenia   HISTORY OF PRESENTING ILLNESS:  Lori Pacheco 83 y.o.  female with past medical history listed as below who was referred by Dr. Ancil Boozer for evaluation of pancytopenia.  Patient is accompanied by her daughter. She lives with one of her daughters and is able to do ADLs.  She had labs done with primary care physician and labs showed neutropenia, anemia and low platelet counts.  Also she reports not eating well, having no appetite and she has lost ten pounds recently. She also feels weakness.  Denies any easy bruising, bleeding events, blood in the stool. She used to take Aspirin but was recently stopped due to thrombocytopenia.  She also has chronic constipation. She has never had colonoscopy.    ROS:  Review of Systems  Constitutional: Positive for appetite change and unexpected weight change.  HENT:  Negative.   Eyes: Negative.   Respiratory: Negative.   Cardiovascular: Negative.   Gastrointestinal: Positive for constipation. Negative for abdominal distention, diarrhea, nausea, rectal pain and vomiting.  Endocrine: Negative.   Genitourinary: Negative.    Musculoskeletal: Positive for back pain.  Skin: Negative.   Neurological: Negative.   Hematological: Negative.   Psychiatric/Behavioral: Negative.     MEDICAL HISTORY:  Past Medical History:  Diagnosis Date  . Hyperlipidemia   . Hypertension   . Thyroid disease     SURGICAL HISTORY: Past Surgical History:  Procedure Laterality Date  . NO PAST SURGERIES      SOCIAL HISTORY: Social History   Social History  . Marital status: Married    Spouse name: N/A  .  Number of children: N/A  . Years of education: N/A   Occupational History  . Not on file.   Social History Main Topics  . Smoking status: Never Smoker  . Smokeless tobacco: Former Systems developer    Types: Snuff    Quit date: 09/27/1986  . Alcohol use No  . Drug use: No  . Sexual activity: No   Other Topics Concern  . Not on file   Social History Narrative   Lives with one daughter, she used to be very active, raking her yard, cooking, cleaning, however over the past month she states she hurts all over and cannot do it anymore.     FAMILY HISTORY: Family History  Problem Relation Age of Onset  . Heart disease Brother   . Cancer Daughter   . Diabetes Brother   . Alcohol abuse Brother     ALLERGIES:  has No Known Allergies.  MEDICATIONS:  Current Outpatient Prescriptions  Medication Sig Dispense Refill  . amLODipine (NORVASC) 5 MG tablet Take 1 tablet (5 mg total) by mouth daily. 90 tablet 1  . Cholecalciferol (VITAMIN D) 2000 units CAPS Take 1 capsule (2,000 Units total) by mouth daily. 30 capsule 0  . gabapentin (NEURONTIN) 100 MG capsule Take 1-3 capsules (100-300 mg total) by mouth 3 (three) times daily. Start at 100 mg and go up by one capsule every 3 days until 300 mg qhs 90 capsule 0  . levothyroxine (SYNTHROID, LEVOTHROID) 137 MCG tablet Take 1 tablet (137 mcg total) by mouth daily. 90 tablet 0  . lisinopril (PRINIVIL,ZESTRIL) 20 MG tablet Take 1 tablet (20 mg total) by mouth  daily. 90 tablet 1  . loratadine (CLARITIN) 10 MG tablet Take 1 tablet (10 mg total) by mouth daily. 30 tablet 11  . lubiprostone (AMITIZA) 8 MCG capsule Take 1 capsule (8 mcg total) by mouth 2 (two) times daily with a meal. 60 capsule 0  . rOPINIRole (REQUIP) 0.5 MG tablet Take 0.5-1 tablets (0.25-0.5 mg total) by mouth at bedtime. 90 tablet 1  . aspirin EC 81 MG tablet Take 1 tablet (81 mg total) by mouth daily. (Patient not taking: Reported on 06/26/2017) 30 tablet 0  . Olopatadine HCl 0.2 % SOLN Apply  1 drop to eye daily. (Patient not taking: Reported on 07/13/2017) 2.5 mL 2   No current facility-administered medications for this visit.       Marland Kitchen  PHYSICAL EXAMINATION: ECOG PERFORMANCE STATUS: 1 - Symptomatic but completely ambulatory Vitals:   07/13/17 1442  BP: (!) 154/85  Pulse: 86  Resp: 14  Temp: (!) 97 F (36.1 C)   Filed Weights   07/13/17 1442  Weight: 110 lb (49.9 kg)    GENERAL:Alert, no distress and comfortable.  EYES: no pallor or icterus OROPHARYNX: no thrush or ulceration; good dentition  NECK: supple, no masses felt LYMPH:  no palpable lymphadenopathy in the cervical, axillary or inguinal regions LUNGS: clear to auscultation and  No wheeze or crackles HEART/CVS: regular rate & rhythm and no murmurs; No lower extremity edema ABDOMEN: abdomen soft, non-tender and normal bowel sounds Musculoskeletal:no cyanosis of digits and no clubbing  PSYCH: alert & oriented x 3  NEURO: no focal motor/sensory deficits SKIN:  no rashes or significant lesions  LABORATORY DATA:  I have reviewed the data as listed Lab Results  Component Value Date   WBC 2.4 (L) 07/13/2017   HGB 10.5 (L) 07/13/2017   HCT 31.1 (L) 07/13/2017   MCV 87.7 07/13/2017   PLT 129 (L) 07/13/2017    Recent Labs  03/28/17 0934 05/11/17 1406 07/13/17 1550  NA 140 138 140  K 4.5 4.6 5.0  CL 104 106 105  CO2 '27 25 27  ' GLUCOSE 92 120* 103*  BUN 23 24* 43*  CREATININE 1.41* 1.55* 1.50*  CALCIUM 9.3 9.5 10.1  GFRNONAA 32* 27* 29*  GFRAA 37* 32* 33*  PROT 7.1 7.4 7.5  ALBUMIN 4.1 4.3 4.1  AST '17 26 21  ' ALT 11 14 12*  ALKPHOS 49 41 48  BILITOT 0.7 0.9 0.7    RADIOGRAPHIC STUDIES: I have personally reviewed the radiological images as listed and agreed with the findings in the report. No recent images.   ASSESSMENT & PLAN:  1. Thrombocytopenia ([place])   2. Other neutropenia ([place])   3. Normocytic anemia   4. Hypothyroidism, unspecified type   5. Weight loss, abnormal   6. Chronic  kidney disease, stage IV (severe) (HCC)    1, 2&3: For the work up of patient's pancytopenia,  I recommend repeating CBC;CMP, check LDH; iron panel,  flowcytometry, folate, Vitamin B12, hepatitis, HIV and monoclonal gammopathy workup. Also, discussed with the patient that if no clear etiology found- bone marrow biopsy would be suggested. Currently await for the above workup.   4 Hypothyroidism: TSH was recently checked and TSH was low.  5 Etiology of weight loss is unclear. Pending hematology/oncology work up.  6 CKD: follow up with nephrology. Anemia can be related to CKD. Currently Hb is above 10, no need for procrit. Continue monitor.  All questions were answered. The patient knows to call the clinic with any problems  questions or concerns.  Return of visit: approximately 10 days to review the above results. Thank you for this kind referral and the opportunity to participate in the care of this patient. A copy of today's note is routed to referring provider Dr.Sowles, Drue Stager Total face to face encounter time for this patient visit was 45 min. >50% of the time was  spent in counseling and coordination of care.   Earlie Server, MD, PhD Hematology Oncology [facility] at [facility] Pager- [phone] 07/13/2017

## 2017-07-13 NOTE — Progress Notes (Signed)
Patient is here today for a new patient visit. Patient reports abdominal pain that "comes and goes." Patient also reports having constipation, but this is normally resolved if she takes Miralax.

## 2017-07-14 LAB — PROTEIN ELECTROPHORESIS, SERUM
A/G Ratio: 1.1 (ref 0.7–1.7)
Albumin ELP: 3.5 g/dL (ref 2.9–4.4)
Alpha-1-Globulin: 0.3 g/dL (ref 0.0–0.4)
Alpha-2-Globulin: 0.6 g/dL (ref 0.4–1.0)
BETA GLOBULIN: 1 g/dL (ref 0.7–1.3)
Gamma Globulin: 1.5 g/dL (ref 0.4–1.8)
Globulin, Total: 3.3 g/dL (ref 2.2–3.9)
Total Protein ELP: 6.8 g/dL (ref 6.0–8.5)

## 2017-07-14 LAB — HEPATITIS PANEL, ACUTE
HCV Ab: <0.1 s/co ratio (ref 0.0–0.9)
HEP B S AG: NEGATIVE
Hep A IgM: NEGATIVE
Hep B C IgM: NEGATIVE

## 2017-07-14 LAB — KAPPA/LAMBDA LIGHT CHAINS
KAPPA FREE LGHT CHN: 26.4 mg/L — AB (ref 3.3–19.4)
KAPPA, LAMDA LIGHT CHAIN RATIO: 0.77 (ref 0.26–1.65)
LAMDA FREE LIGHT CHAINS: 34.2 mg/L — AB (ref 5.7–26.3)

## 2017-07-14 LAB — HIV ANTIBODY (ROUTINE TESTING W REFLEX): HIV SCREEN 4TH GENERATION: NONREACTIVE

## 2017-07-17 LAB — COMP PANEL: LEUKEMIA/LYMPHOMA

## 2017-07-21 ENCOUNTER — Inpatient Hospital Stay: Payer: Medicare Other | Attending: Oncology | Admitting: Oncology

## 2017-07-21 VITALS — BP 133/72 | HR 89 | Temp 96.4°F | Wt 110.1 lb

## 2017-07-21 DIAGNOSIS — R140 Abdominal distension (gaseous): Secondary | ICD-10-CM | POA: Insufficient documentation

## 2017-07-21 DIAGNOSIS — R5383 Other fatigue: Secondary | ICD-10-CM | POA: Diagnosis not present

## 2017-07-21 DIAGNOSIS — R531 Weakness: Secondary | ICD-10-CM | POA: Insufficient documentation

## 2017-07-21 DIAGNOSIS — I129 Hypertensive chronic kidney disease with stage 1 through stage 4 chronic kidney disease, or unspecified chronic kidney disease: Secondary | ICD-10-CM | POA: Diagnosis not present

## 2017-07-21 DIAGNOSIS — Z87891 Personal history of nicotine dependence: Secondary | ICD-10-CM | POA: Diagnosis not present

## 2017-07-21 DIAGNOSIS — Z79899 Other long term (current) drug therapy: Secondary | ICD-10-CM | POA: Diagnosis not present

## 2017-07-21 DIAGNOSIS — D649 Anemia, unspecified: Secondary | ICD-10-CM

## 2017-07-21 DIAGNOSIS — E039 Hypothyroidism, unspecified: Secondary | ICD-10-CM | POA: Insufficient documentation

## 2017-07-21 DIAGNOSIS — N184 Chronic kidney disease, stage 4 (severe): Secondary | ICD-10-CM | POA: Insufficient documentation

## 2017-07-21 DIAGNOSIS — D61818 Other pancytopenia: Secondary | ICD-10-CM | POA: Diagnosis not present

## 2017-07-21 DIAGNOSIS — R634 Abnormal weight loss: Secondary | ICD-10-CM | POA: Insufficient documentation

## 2017-07-21 DIAGNOSIS — Z809 Family history of malignant neoplasm, unspecified: Secondary | ICD-10-CM | POA: Diagnosis not present

## 2017-07-21 DIAGNOSIS — D696 Thrombocytopenia, unspecified: Secondary | ICD-10-CM

## 2017-07-21 DIAGNOSIS — E785 Hyperlipidemia, unspecified: Secondary | ICD-10-CM | POA: Diagnosis not present

## 2017-07-21 DIAGNOSIS — K5900 Constipation, unspecified: Secondary | ICD-10-CM | POA: Insufficient documentation

## 2017-07-21 NOTE — Progress Notes (Signed)
Patient here today for follow up.  Patient states no new concerns today  

## 2017-07-22 ENCOUNTER — Encounter: Payer: Self-pay | Admitting: Oncology

## 2017-07-22 NOTE — Progress Notes (Signed)
Hematology/Oncology Follow Up Note [facility] [phone] [phone]  CONSULT NOTE Patient Care Team: Steele Sizer, MD as PCP - General (Family Medicine) Anthonette Legato, MD as Consulting Physician (Nephrology)   Referring Physician: Herold Harms Drue Stager  CHIEF COMPLAINTS/PURPOSE OF CONSULTATION:  Follow up for evaluation of Pancytopenia   HISTORY OF PRESENTING ILLNESS:  Lori Pacheco 83 y.o.  female with past medical history listed as below present for follow up for evaluation of pancytopenia.   She continue to feel weakness, lack of energy,not improving with resting or napping.. She lives with her one of her daughter. She feels abdominal fullness, feel as if bowel does not move. .   Denies any easy bruising, bleeding events, blood in the stool. She used to take Aspirin but was recently stopped due to thrombocytopenia.  She also has chronic constipation. She has never had colonoscopy.    Review of Systems - Oncology Constitutional: Negative for fever, night sweats,positive for fatigue, unintentional weight loss, change in appetite. HENT: Negative for ear pain, hearing loss, nasal bleeding Eyes: Negative for eye pain, double vision   Respiratory: Negative for wheezing, shortness of breath, cough Cardiovascular: Negative for chest pain, palpitation.   Gastrointestinal: Negative abdominal pain, diarrhea, nausea vomiting, positive for abdominal fullness. Endocrine: Negative  Genitourinary: Negative for dysuria, hematuria, frequency Skin: Negative for rash, iching, bruising Neurological: Negative for headache, dizziness, seizure Hematological: Negative for easy bruising/bleeding, lymph node enlargement Psychiatric/Behavioral: Negative for depression, anxiety, suicidality MEDICAL HISTORY:  Past Medical History:  Diagnosis Date  . Hyperlipidemia   . Hypertension   . Thyroid disease     SURGICAL HISTORY: Past Surgical History:    Procedure Laterality Date  . NO PAST SURGERIES      SOCIAL HISTORY: Social History   Social History  . Marital status: Married    Spouse name: N/A  . Number of children: N/A  . Years of education: N/A   Occupational History  . Not on file.   Social History Main Topics  . Smoking status: Never Smoker  . Smokeless tobacco: Former Systems developer    Types: Snuff    Quit date: 09/27/1986  . Alcohol use No  . Drug use: No  . Sexual activity: No   Other Topics Concern  . Not on file   Social History Narrative   Lives with one daughter, she used to be very active, raking her yard, cooking, cleaning, however over the past month she states she hurts all over and cannot do it anymore.     FAMILY HISTORY: Family History  Problem Relation Age of Onset  . Heart disease Brother   . Cancer Daughter   . Diabetes Brother   . Alcohol abuse Brother     ALLERGIES:  has No Known Allergies.  MEDICATIONS:  Current Outpatient Prescriptions  Medication Sig Dispense Refill  . amLODipine (NORVASC) 5 MG tablet Take 1 tablet (5 mg total) by mouth daily. 90 tablet 1  . gabapentin (NEURONTIN) 100 MG capsule Take 1-3 capsules (100-300 mg total) by mouth 3 (three) times daily. Start at 100 mg and go up by one capsule every 3 days until 300 mg qhs 90 capsule 0  . levothyroxine (SYNTHROID, LEVOTHROID) 137 MCG tablet Take 1 tablet (137 mcg total) by mouth daily. 90 tablet 0  . lisinopril (PRINIVIL,ZESTRIL) 20 MG tablet Take 1 tablet (20 mg total) by mouth daily. 90 tablet 1  . loratadine (CLARITIN) 10 MG tablet Take 1 tablet (10 mg total) by mouth daily.  30 tablet 11  . lubiprostone (AMITIZA) 8 MCG capsule Take 1 capsule (8 mcg total) by mouth 2 (two) times daily with a meal. 60 capsule 0  . Olopatadine HCl 0.2 % SOLN Apply 1 drop to eye daily. 2.5 mL 2  . rOPINIRole (REQUIP) 0.5 MG tablet Take 0.5-1 tablets (0.25-0.5 mg total) by mouth at bedtime. 90 tablet 1  . Vitamin D, Ergocalciferol, (DRISDOL) 50000  units CAPS capsule Take 50,000 Units by mouth once a week.    Marland Kitchen aspirin EC 81 MG tablet Take 1 tablet (81 mg total) by mouth daily. (Patient not taking: Reported on 07/21/2017) 30 tablet 0   No current facility-administered medications for this visit.       Marland Kitchen  PHYSICAL EXAMINATION: ECOG PERFORMANCE STATUS: 1 - Symptomatic but completely ambulatory Vitals:   07/21/17 1428  BP: 133/72  Pulse: 89  Temp: (!) 96.4 F (35.8 C)   Filed Weights   07/21/17 1428  Weight: 110 lb 2 oz (50 kg)   GENERAL: No distress, well nourished.  SKIN:  No rashes or significant lesions  HEAD: Normocephalic, No masses, lesions, tenderness or abnormalities  EYES: Conjunctiva are pink, non icteric ENT: External ears normal ,lips , buccal mucosa, and tongue normal and mucous membranes are moist  LYMPH: No palpable cervical and axillary lymphadenopathy  LUNGS: Clear to auscultation, no crackles or wheezes HEART: Regular rate & rhythm, no murmurs, no gallops, S1 normal and S2 normal  ABDOMEN: Abdomen soft, non-tender, normal bowel sounds, I did not appreciate any  masses or organomegaly  MUSCULOSKELETAL: No CVA tenderness and no tenderness on percussion of the back or rib cage.  EXTREMITIES: No edema, no skin discoloration or tenderness NEURO: Alert & oriented, no focal motor/sensory deficits.  LABORATORY DATA:  I have reviewed the data as listed Lab Results  Component Value Date   WBC 2.4 (L) 07/13/2017   HGB 10.5 (L) 07/13/2017   HCT 31.1 (L) 07/13/2017   MCV 87.7 07/13/2017   PLT 129 (L) 07/13/2017    Recent Labs  03/28/17 0934 05/11/17 1406 07/13/17 1550  NA 140 138 140  K 4.5 4.6 5.0  CL 104 106 105  CO2 '27 25 27  ' GLUCOSE 92 120* 103*  BUN 23 24* 43*  CREATININE 1.41* 1.55* 1.50*  CALCIUM 9.3 9.5 10.1  GFRNONAA 32* 27* 29*  GFRAA 37* 32* 33*  PROT 7.1 7.4 7.5  ALBUMIN 4.1 4.3 4.1  AST '17 26 21  ' ALT 11 14 12*  ALKPHOS 49 41 48  BILITOT 0.7 0.9 0.7   ASSESSMENT & PLAN:  1.  Other pancytopenia ([place])   2. Thrombocytopenia ([place])   3. Normocytic anemia   4. Weight loss, abnormal   5. Chronic kidney disease, stage IV (severe) ([place])   6. Hypothyroidism, unspecified type    1, 2&3: Flowcytometry showed monotypic abberance. Results reviewed with patient and her daughter. Discussed with them that the recommended next step is to have bone marrow biopsy to clarify the abnormality seen on the flowcytometry. I explained the details how bone marrow biopsy procedure is usually done and what to anticipate after the procedure. Given her advanced age, patient and daughter want to discuss with patient's other children as well. . Tentatively she is referred to interventional radiology and if she changes her mind and decides not to proceed procedure., she will call and let us know. .   4 Hypothyroidism: TSH was recently checked and TSH was low.  5 Etiology of weight loss  is unclear. Pending hematology/oncology work up.  6 CKD: follow up with nephrology. Anemia can be related to CKD. Currently Hb is above 10, no need for procrit. Continue monitor.  All questions were answered. The patient knows to call the clinic with any problems questions or concerns.  Return of visit: one week after bone marrow biopsy.   Earlie Server, MD, PhD Hematology Oncology [facility] Pager- [phone] 07/22/2017

## 2017-07-24 ENCOUNTER — Encounter: Payer: Self-pay | Admitting: Family Medicine

## 2017-07-24 ENCOUNTER — Ambulatory Visit (INDEPENDENT_AMBULATORY_CARE_PROVIDER_SITE_OTHER): Payer: Medicare Other | Admitting: Family Medicine

## 2017-07-24 ENCOUNTER — Ambulatory Visit: Payer: Medicare Other | Admitting: Oncology

## 2017-07-24 VITALS — BP 130/60 | HR 92 | Resp 14 | Wt 110.7 lb

## 2017-07-24 DIAGNOSIS — M545 Low back pain: Secondary | ICD-10-CM | POA: Diagnosis not present

## 2017-07-24 DIAGNOSIS — N2581 Secondary hyperparathyroidism of renal origin: Secondary | ICD-10-CM

## 2017-07-24 DIAGNOSIS — E039 Hypothyroidism, unspecified: Secondary | ICD-10-CM | POA: Diagnosis not present

## 2017-07-24 DIAGNOSIS — G8929 Other chronic pain: Secondary | ICD-10-CM | POA: Diagnosis not present

## 2017-07-24 DIAGNOSIS — K5909 Other constipation: Secondary | ICD-10-CM

## 2017-07-24 DIAGNOSIS — D61818 Other pancytopenia: Secondary | ICD-10-CM | POA: Diagnosis not present

## 2017-07-24 MED ORDER — LEVOTHYROXINE SODIUM 137 MCG PO TABS: 137.0000 ug | ORAL_TABLET | Freq: Every day | ORAL | 1 refills | Status: DC

## 2017-07-24 MED ORDER — GABAPENTIN 100 MG PO CAPS: 100.0000 mg | ORAL_CAPSULE | Freq: Three times a day (TID) | ORAL | 0 refills | Status: DC

## 2017-07-24 MED ORDER — LUBIPROSTONE 24 MCG PO CAPS: 24.0000 ug | ORAL_CAPSULE | Freq: Two times a day (BID) | ORAL | 0 refills | Status: DC

## 2017-07-24 MED ORDER — ACETAMINOPHEN 500 MG PO TABS: 500.0000 mg | ORAL_TABLET | Freq: Four times a day (QID) | ORAL | 0 refills | Status: AC | PRN

## 2017-07-24 NOTE — Progress Notes (Signed)
Name: Lori Pacheco   MRN: [id number]    DOB: Feb 25, 1921   Date:07/24/2017       Progress Note  Subjective  Chief Complaint  Chief Complaint  Patient presents with  . Hypothyroidism  . Chronic Kidney Disease    HPI  She is here today with her daughter [name] )  Pancytopenia: she keeps losing weight, seen by hematologist and having multiple tests done, she sates that if she has cancer she does not want to be treated. Explained that she should re-consider if she wants to have bone marrow biopsy.   HTN/CKI stage III: seeing Dr. Holley Raring, she had some repeat labs and kidney US. BP is stable in the 130's. Daughter states she has been compliant with medications over the past month. She denies chest pain but has noticed palpitations over the past week  Hypothyroidism: she was supposed to take only 137 mcg daily, however she has been taking 150 mcg( old dose) and new dose ( 137 mcg) for about one week. TSH was extremity high over 100 back in July, she lost 10 lbs since started taking medication daily , but last one was suppressed. Denies diarrhea but has noticed more palpitation lately. Some nausea, no vomiting. Advised to have home health go to the house and check on medication, but daughter and patient would like to hold off.   Chronic low back pain/constipation: it affects her gait, not as active as she used to because of low back pain that radiates to right lower quadrant, pain seems chronic had CT pelvix in 2016 and 2018, she has atherosclerosis but no masses. We sent gabapentin to pharmacy, but daughter is not sure if she is taking, she will verify when she gets home, advised to check list and bring medications in every visit.    Patient Active Problem List   Diagnosis Date Noted  . Pancytopenia ([place]) 07/24/2017  . Thrombocytopenia ([place]) 05/15/2017  . Secondary renal hyperparathyroidism ([place]) 05/15/2017  . Atherosclerosis of abdominal aorta ([place]) 11/10/2016  . Other neutropenia ([place])  11/10/2016  . Perennial allergic rhinitis with seasonal variation 11/10/2016  . Vitamin D deficiency 03/29/2016  . Hypothyroidism 09/18/2015  . Essential hypertension 05/19/2015  . Hyperlipemia 05/19/2015  . Chronic kidney disease, stage IV (severe) ([place]) 05/19/2015    Past Surgical History:  Procedure Laterality Date  . NO PAST SURGERIES      Family History  Problem Relation Age of Onset  . Heart disease Brother   . Cancer Daughter   . Diabetes Brother   . Alcohol abuse Brother     Social History   Social History  . Marital status: Married    Spouse name: N/A  . Number of children: N/A  . Years of education: N/A   Occupational History  . Not on file.   Social History Main Topics  . Smoking status: Never Smoker  . Smokeless tobacco: Former Systems developer    Types: Snuff    Quit date: 09/27/1986  . Alcohol use No  . Drug use: No  . Sexual activity: No   Other Topics Concern  . Not on file   Social History Narrative   Lives with one daughter, she used to be very active, raking her yard, cooking, cleaning, however over the past month she states she hurts all over and cannot do it anymore.      Current Outpatient Prescriptions:  .  amLODipine (NORVASC) 5 MG tablet, Take 1 tablet (5 mg total) by mouth daily., Disp:  90 tablet, Rfl: 1 .  lisinopril (PRINIVIL,ZESTRIL) 20 MG tablet, Take 1 tablet (20 mg total) by mouth daily., Disp: 90 tablet, Rfl: 1 .  loratadine (CLARITIN) 10 MG tablet, Take 1 tablet (10 mg total) by mouth daily., Disp: 30 tablet, Rfl: 11 .  lubiprostone (AMITIZA) 24 MCG capsule, Take 1 capsule (24 mcg total) by mouth 2 (two) times daily with a meal., Disp: 60 capsule, Rfl: 0 .  Olopatadine HCl 0.2 % SOLN, Apply 1 drop to eye daily., Disp: 2.5 mL, Rfl: 2 .  rOPINIRole (REQUIP) 0.5 MG tablet, Take 0.5-1 tablets (0.25-0.5 mg total) by mouth at bedtime., Disp: 90 tablet, Rfl: 1 .  Vitamin D, Ergocalciferol, (DRISDOL) 50000 units CAPS capsule, Take 50,000 Units by  mouth once a week., Disp: , Rfl:  .  acetaminophen (TYLENOL) 500 MG tablet, Take 1 tablet (500 mg total) by mouth every 6 (six) hours as needed., Disp: 100 tablet, Rfl: 0 .  gabapentin (NEURONTIN) 100 MG capsule, Take 1-3 capsules (100-300 mg total) by mouth 3 (three) times daily. Start at 100 mg and go up by one capsule every 3 days until 300 mg qhs, Disp: 90 capsule, Rfl: 0 .  levothyroxine (SYNTHROID, LEVOTHROID) 137 MCG tablet, Take 1 tablet (137 mcg total) by mouth daily., Disp: 90 tablet, Rfl: 1  No Known Allergies   ROS  Constitutional: Negative for fever, positive for  weight change ( continues to lose weight)   Respiratory: Negative for cough and shortness of breath.   Cardiovascular: Negative for chest pain , positive for  palpitations.  Gastrointestinal: Negative for abdominal pain, no bowel changes.  Musculoskeletal: Positive  for gait problem but no joint swelling.  Skin: Negative for rash.  Neurological: positive for dizziness but no headache.  No other specific complaints in a complete review of systems (except as listed in HPI above).  Objective  Vitals:   07/24/17 1549  BP: 130/60  Pulse: 92  Resp: 14  SpO2: 96%  Weight: 110 lb 11.2 oz (50.2 kg)    Body mass index is 20.92 kg/m.  Physical Exam  Constitutional: Patient appears very thin.  No distress.  HEENT: head atraumatic, normocephalic, pupils equal and reactive to light,neck supple, throat within normal limits Cardiovascular: Normal rate, regular rhythm and normal heart sounds. No murmur heard. No BLE edema. Pulmonary/Chest: Effort normal and breath sounds normal. No respiratory distress. Abdominal: Soft. There is no tenderness.  Muscular Skeletal: slow gait, seems to be favoring right side, negative straight leg raise, no tenderness during palpation of spine.  Psychiatric: Patient has a normal mood and affect. behavior is normal. Judgment and thought content normal.  Recent Results (from the past  2160 hour(s))  Lipase, blood     Status: None   Collection Time: 05/11/17  2:06 PM  Result Value Ref Range   Lipase 32 11 - 51 U/L  Comprehensive metabolic panel     Status: Abnormal   Collection Time: 05/11/17  2:06 PM  Result Value Ref Range   Sodium 138 135 - 145 mmol/L   Potassium 4.6 3.5 - 5.1 mmol/L   Chloride 106 101 - 111 mmol/L   CO2 25 22 - 32 mmol/L   Glucose, Bld 120 (H) 65 - 99 mg/dL   BUN 24 (H) 6 - 20 mg/dL   Creatinine, Ser 1.55 (H) 0.44 - 1.00 mg/dL   Calcium 9.5 8.9 - 10.3 mg/dL   Total Protein 7.4 6.5 - 8.1 g/dL   Albumin 4.3 3.5 - 5.0  g/dL   AST 26 15 - 41 U/L   ALT 14 14 - 54 U/L   Alkaline Phosphatase 41 38 - 126 U/L   Total Bilirubin 0.9 0.3 - 1.2 mg/dL   GFR calc non Af Amer 27 (L) >60 mL/min   GFR calc Af Amer 32 (L) >60 mL/min    Comment: (NOTE) The eGFR has been calculated using the CKD EPI equation. This calculation has not been validated in all clinical situations. eGFR's persistently <60 mL/min signify possible Chronic Kidney Disease.    Anion gap 7 5 - 15  CBC     Status: Abnormal   Collection Time: 05/11/17  2:06 PM  Result Value Ref Range   WBC 2.8 (L) 3.6 - 11.0 K/uL   RBC 3.94 3.80 - 5.20 MIL/uL   Hemoglobin 12.1 12.0 - 16.0 g/dL   HCT 36.4 35.0 - 47.0 %   MCV 92.3 80.0 - 100.0 fL   MCH 30.7 26.0 - 34.0 pg   MCHC 33.2 32.0 - 36.0 g/dL   RDW 13.4 11.5 - 14.5 %   Platelets 140 (L) 150 - 440 K/uL  TSH     Status: Abnormal   Collection Time: 05/11/17  2:06 PM  Result Value Ref Range   TSH 123.387 (H) 0.350 - 4.500 uIU/mL    Comment: Performed by a 3rd Generation assay with a functional sensitivity of <=0.01 uIU/mL.  Urinalysis, Complete w Microscopic     Status: Abnormal   Collection Time: 05/11/17  5:05 PM  Result Value Ref Range   Color, Urine YELLOW (A) YELLOW   APPearance CLEAR (A) CLEAR   Specific Gravity, Urine 1.010 1.005 - 1.030   pH 7.0 5.0 - 8.0   Glucose, UA NEGATIVE NEGATIVE mg/dL   Hgb urine dipstick SMALL (A)  NEGATIVE   Bilirubin Urine NEGATIVE NEGATIVE   Ketones, ur 5 (A) NEGATIVE mg/dL   Protein, ur 30 (A) NEGATIVE mg/dL   Nitrite NEGATIVE NEGATIVE   Leukocytes, UA TRACE (A) NEGATIVE   RBC / HPF 0-5 0 - 5 RBC/hpf   WBC, UA 0-5 0 - 5 WBC/hpf   Bacteria, UA NONE SEEN NONE SEEN   Squamous Epithelial / LPF 0-5 (A) NONE SEEN   Mucus PRESENT   CBC with Differential/Platelet     Status: Abnormal   Collection Time: 06/26/17  3:46 PM  Result Value Ref Range   WBC 2.2 (L) 3.8 - 10.8 Thousand/uL   RBC 3.58 (L) 3.80 - 5.10 Million/uL   Hemoglobin 10.2 (L) 11.7 - 15.5 g/dL   HCT 32.0 (L) 35.0 - 45.0 %   MCV 89.4 80.0 - 100.0 fL   MCH 28.5 27.0 - 33.0 pg   MCHC 31.9 (L) 32.0 - 36.0 g/dL   RDW 12.5 11.0 - 15.0 %   Platelets 130 (L) 140 - 400 Thousand/uL   MPV 11.4 7.5 - 12.5 fL   Neutro Abs 999 (L) 1,500 - 7,800 cells/uL   Lymphs Abs 946 850 - 3,900 cells/uL   WBC mixed population 227 200 - 950 cells/uL   Eosinophils Absolute 20 15 - 500 cells/uL   Basophils Absolute 9 0 - 200 cells/uL   Neutrophils Relative % 45.4 %   Total Lymphocyte 43.0 %   Monocytes Relative 10.3 %   Eosinophils Relative 0.9 %   Basophils Relative 0.4 %  TSH     Status: Abnormal   Collection Time: 06/26/17  3:46 PM  Result Value Ref Range   TSH 0.05 (L) 0.40 -  4.50 mIU/L  Kappa/lambda light chains     Status: Abnormal   Collection Time: 07/13/17  3:50 PM  Result Value Ref Range   Kappa free light chain 26.4 (H) 3.3 - 19.4 mg/L   Lamda free light chains 34.2 (H) 5.7 - 26.3 mg/L   Kappa, lamda light chain ratio 0.77 0.26 - 1.65    Comment: (NOTE) Performed At: [facility] [place], [place] [id number] Lindon Romp MD WU:[id number]   CBC with Differential/Platelet     Status: Abnormal   Collection Time: 07/13/17  3:50 PM  Result Value Ref Range   WBC 2.4 (L) 3.6 - 11.0 K/uL   RBC 3.54 (L) 3.80 - 5.20 MIL/uL   Hemoglobin 10.5 (L) 12.0 - 16.0 g/dL   HCT 31.1 (L) 35.0 - 47.0 %   MCV 87.7  80.0 - 100.0 fL   MCH 29.6 26.0 - 34.0 pg   MCHC 33.7 32.0 - 36.0 g/dL   RDW 13.2 11.5 - 14.5 %   Platelets 129 (L) 150 - 440 K/uL   Neutrophils Relative % 49 %   Neutro Abs 1.2 (L) 1.4 - 6.5 K/uL   Lymphocytes Relative 41 %   Lymphs Abs 1.0 1.0 - 3.6 K/uL   Monocytes Relative 10 %   Monocytes Absolute 0.2 0.2 - 0.9 K/uL   Eosinophils Relative 1 %   Eosinophils Absolute 0.0 0 - 0.7 K/uL   Basophils Relative 1 %   Basophils Absolute 0.0 0 - 0.1 K/uL  Comprehensive metabolic panel     Status: Abnormal   Collection Time: 07/13/17  3:50 PM  Result Value Ref Range   Sodium 140 135 - 145 mmol/L   Potassium 5.0 3.5 - 5.1 mmol/L   Chloride 105 101 - 111 mmol/L   CO2 27 22 - 32 mmol/L   Glucose, Bld 103 (H) 65 - 99 mg/dL   BUN 43 (H) 6 - 20 mg/dL   Creatinine, Ser 1.50 (H) 0.44 - 1.00 mg/dL   Calcium 10.1 8.9 - 10.3 mg/dL   Total Protein 7.5 6.5 - 8.1 g/dL   Albumin 4.1 3.5 - 5.0 g/dL   AST 21 15 - 41 U/L   ALT 12 (L) 14 - 54 U/L   Alkaline Phosphatase 48 38 - 126 U/L   Total Bilirubin 0.7 0.3 - 1.2 mg/dL   GFR calc non Af Amer 29 (L) >60 mL/min   GFR calc Af Amer 33 (L) >60 mL/min    Comment: (NOTE) The eGFR has been calculated using the CKD EPI equation. This calculation has not been validated in all clinical situations. eGFR's persistently <60 mL/min signify possible Chronic Kidney Disease.    Anion gap 8 5 - 15  Lactate dehydrogenase     Status: Abnormal   Collection Time: 07/13/17  3:50 PM  Result Value Ref Range   LDH 195 (H) 98 - 192 U/L  Iron and TIBC     Status: None   Collection Time: 07/13/17  3:50 PM  Result Value Ref Range   Iron 61 28 - 170 ug/dL   TIBC 292 250 - 450 ug/dL   Saturation Ratios 21 10.4 - 31.8 %   UIBC 231 ug/dL  Ferritin     Status: None   Collection Time: 07/13/17  3:50 PM  Result Value Ref Range   Ferritin 240 11 - 307 ng/mL  Hepatitis panel, acute     Status: None   Collection Time: 07/13/17  3:50 PM  Result Value Ref Range   Hepatitis  B Surface Ag Negative Negative   HCV Ab <0.1 0.0 - 0.9 s/co ratio    Comment: (NOTE)                                  Negative:     < 0.8                             Indeterminate: 0.8 - 0.9                                  Positive:     > 0.9 The CDC recommends that a positive HCV antibody result be followed up with a HCV Nucleic Acid Amplification test (485462). Performed At: [facility] [place], [place] [id number] Lindon Romp MD HW:[id number]    Hep A IgM Negative Negative   Hep B C IgM Negative Negative  HIV antibody     Status: None   Collection Time: 07/13/17  3:50 PM  Result Value Ref Range   HIV Screen 4th Generation wRfx Non Reactive Non Reactive    Comment: (NOTE) Performed At: [facility] [place], [place] [id number] Lindon Romp MD ZW:[id number]   Flow cytometry panel-leukemia/lymphoma work-up     Status: None   Collection Time: 07/13/17  3:50 PM  Result Value Ref Range   PATH INTERP XXX-IMP Comment     Comment: (NOTE) 1)  Monocytes show phenotypic aberrancy, see comment 2)  No other significant immunophenotypic abnormality detected    ANNOTATION COMMENT IMP Comment     Comment: (NOTE) Phenotypic aberrancies of monocytes can be seen in reactive and neoplastic processes. Bone marrow evaluation and cytogenetic studies may be indicated. Clinical correlation is required.    CLINICAL INFO Comment     Comment: (NOTE) Accompanying CBC dated 07/13/17 shows pancytopenia and neutropenia: WBC count 2.4, RBC 3.5, Hgb 10.5, Hct 31.1, MCV 87.7, Neu 1.2, Lym 1.0, Mon 0.2, Plt 129K.    Misc Source Comment     Comment: Peripheral blood   ASSESSMENT OF LEUKOCYTES Comment     Comment: (NOTE) No monoclonal B cell population is detected. kappa:lambda ratio 0.8 There is no loss of, or aberrant expression of, the pan T cell antigens to suggest a neoplastic T cell process. CD4:CD8 ratio 3.3 No circulating blasts are  detected. There is no immunophenotypic  evidence of abnormal myeloid maturation. Monocytes show dim/partial aberrant expression of CD56, a finding that can be seen in association with both reactive/activated processes as well as neoplastic processes. Analysis of the leukocyte population shows: granulocytes 59%, monocytes 6%, lymphocytes 35%, blasts <0.1%, B cells 4%, T cells 27%, NK cells 4%.    % Viable Cells Comment     Comment: 96%   ANALYSIS AND GATING STRATEGY Comment     Comment: 8 color analysis with CD45/SSC gating   IMMUNOPHENOTYPING STUDY Comment     Comment: (NOTE) CD2       Normal         CD3       Normal CD4       Normal         CD5       Normal CD7       Normal  CD8       Normal CD10      Normal         CD11b     Normal CD13      Normal         CD14      Normal CD16      Normal         CD19      Normal CD20      Normal         CD33      Normal CD34      Normal         CD38      Normal CD45      Normal         CD56      See Text CD57      Normal         CD117     Normal HLA-DR    Normal         KAPPA     Normal LAMBDA    Normal         CD64      Normal    PATHOLOGIST NAME Comment     Comment: Lovett Sox, M.D.   COMMENT: Comment     Comment: (NOTE) Each antibody in this assay was utilized to assess for potential abnormalities of studied cell populations or to characterize identified abnormalities. This test was developed and its performance characteristics determined by [facility].  It has not been cleared or approved by the U.S. Food and Drug Administration. The FDA has determined that such clearance or approval is not necessary. This test is used for clinical purposes.  It should not be regarded as investigational or for research. Performed At: -[facility] [address] [place], [place] [id number] Nechama Guard MD [id number] Performed At: [facility] [address] [place], [place] [id number] Nechama Guard MD  TD:[id number]   Protein electrophoresis, serum     Status: None   Collection Time: 07/13/17  3:50 PM  Result Value Ref Range   Total Protein ELP 6.8 6.0 - 8.5 g/dL   Albumin ELP 3.5 2.9 - 4.4 g/dL   Alpha-1-Globulin 0.3 0.0 - 0.4 g/dL   Alpha-2-Globulin 0.6 0.4 - 1.0 g/dL   Beta Globulin 1.0 0.7 - 1.3 g/dL   Gamma Globulin 1.5 0.4 - 1.8 g/dL   M-Spike, % Not Observed Not Observed g/dL   SPE Interp. Comment     Comment: (NOTE) The SPE pattern appears essentially unremarkable. Evidence of monoclonal protein is not apparent. Performed At: [facility] [place], [place] [id number] Lindon Romp MD DV:[id number]    Comment Comment     Comment: (NOTE) Protein electrophoresis scan will follow via computer, mail, or courier delivery.    GLOBULIN, TOTAL 3.3 2.2 - 3.9 g/dL   A/G Ratio 1.1 0.7 - 1.7     PHQ2/9: Depression screen [facility] 2/9 06/26/2017 03/28/2017 12/19/2016 11/10/2016 09/27/2016  Decreased Interest 0 0 0 0 0  Down, Depressed, Hopeless 1 0 0 0 0  PHQ - 2 Score 1 0 0 0 0    Fall Risk: Fall Risk  07/24/2017 06/26/2017 03/28/2017 12/19/2016 11/10/2016  Falls in the past year? No Yes Yes Yes No  Number falls in past yr: - 2 or more 2 or more 2 or more -  Injury with Fall? - No No No -  Risk for fall due to : -  History of fall(s);Impaired balance/gait - Other (Comment) -  Risk for fall due to: Comment - - - "due to getting in a hurry" -  Follow up - - - Falls prevention discussed -      Assessment & Plan  1. Acquired hypothyroidism  - levothyroxine (SYNTHROID, LEVOTHROID) 137 MCG tablet; Take 1 tablet (137 mcg total) by mouth daily.  Dispense: 90 tablet; Refill: 1 Explained again how t take medication, skip tomorrows dose  2. Chronic bilateral low back pain without sciatica  - gabapentin (NEURONTIN) 100 MG capsule; Take 1-3 capsules (100-300 mg total) by mouth 3 (three) times daily. Start at 100 mg and go up by one capsule every 3 days until 300 mg  qhs  Dispense: 90 capsule; Refill: 0  3. Secondary renal hyperparathyroidism ([place])  Keep follow up with Dr. Holley Raring.  4. Other constipation  We will increase dose of Amiitza sinde 8 mg did not work   5. Pancytopenia ([place])  Discussed power attorney of health care: daughter She is not DNR., but discussed it with her and they will take forms home and fill it out prior to next visit.  Discussed why the oncologist is doing tests and she states she would not want to be treated for cancer, advised to discuss it with them prior to further evaluation.

## 2017-07-24 NOTE — Patient Instructions (Addendum)
Stop 150 mcg and 137 mcg , needs to stop 150 mcg ( started taking both one week ago)   Needs to get Gabapentin and make sure only taking levothyroxine 137 mcg daily

## 2017-07-27 ENCOUNTER — Ambulatory Visit
Admission: RE | Admit: 2017-07-27 | Discharge: 2017-07-27 | Disposition: A | Discharge status: Home / Self Care | Payer: Medicare Other | Source: Ambulatory Visit | Attending: Oncology | Admitting: Oncology

## 2017-08-18 ENCOUNTER — Inpatient Hospital Stay: Payer: Medicare Other

## 2017-08-18 ENCOUNTER — Inpatient Hospital Stay: Payer: Medicare Other | Admitting: Oncology

## 2017-08-24 ENCOUNTER — Other Ambulatory Visit: Payer: Self-pay | Admitting: Family Medicine

## 2017-08-24 DIAGNOSIS — G8929 Other chronic pain: Secondary | ICD-10-CM

## 2017-08-24 DIAGNOSIS — M545 Low back pain: Principal | ICD-10-CM

## 2017-08-25 NOTE — Telephone Encounter (Signed)
Patient requesting refill of gabapentin to [facility].

## 2017-08-25 NOTE — Telephone Encounter (Signed)
Did she go up on dose? Is she taking 300 mg at night? I can send a 300 mg tablet instead of 3 of the 100mg 

## 2017-08-28 NOTE — Telephone Encounter (Signed)
Copied from [place] [id number]. Topic: Quick Communication - See Telephone Encounter >> Aug 28, 2017  3:35 PM Robina Ade, Helene Kelp D wrote: CRM for notification. See Telephone encounter for: 08/28/17. Patient called and medication refill on her gabapentin sent to [facility] on [address]. Because she is completely out. Pt said that she did go up on doze and that it is on to send 300 mg tablet instead of 3 of the 100mg .

## 2017-08-28 NOTE — Telephone Encounter (Signed)
Please see attached note  Copied from [place] [phone]. Topic: Quick Communication - See Telephone Encounter >> Aug 28, 2017  3:35 PM Robina Ade, Helene Kelp D wrote: CRM for notification. See Telephone encounter for: 08/28/17. Patient called and medication refill on her gabapentin sent to [facility] on [address]. Because she is completely out. Pt said that she did go up on doze and that it is on to send 300 mg tablet instead of 3 of the 100mg 

## 2017-08-29 ENCOUNTER — Telehealth: Payer: Self-pay | Admitting: Family Medicine

## 2017-08-29 MED ORDER — GABAPENTIN 300 MG PO CAPS: 300.0000 mg | ORAL_CAPSULE | Freq: Every day | ORAL | 1 refills | Status: DC

## 2017-08-29 NOTE — Telephone Encounter (Signed)
Pt  Takes    Three  Gabapentin pills  /  Day    Per  Caregiver

## 2017-08-29 NOTE — Telephone Encounter (Signed)
Called patient to clarify what dose of gabapentin she is taking. I told her to call back with the dose and we would resend to Dr. Ancil Boozer to refill. She refused the refill before because it was not clear as to what dose she was taking.

## 2017-08-29 NOTE — Addendum Note (Signed)
Addended by: Inda Coke on: 08/29/2017 10:24 AM   Modules accepted: Orders

## 2017-09-06 ENCOUNTER — Ambulatory Visit: Payer: Medicare Other | Admitting: Family Medicine

## 2017-09-06 ENCOUNTER — Encounter: Payer: Self-pay | Admitting: Family Medicine

## 2017-09-06 VITALS — BP 162/74 | HR 83 | Temp 98.3°F | Resp 18 | Ht 61.0 in | Wt 116.1 lb

## 2017-09-06 DIAGNOSIS — N2581 Secondary hyperparathyroidism of renal origin: Secondary | ICD-10-CM | POA: Diagnosis not present

## 2017-09-06 DIAGNOSIS — R252 Cramp and spasm: Secondary | ICD-10-CM

## 2017-09-06 DIAGNOSIS — E039 Hypothyroidism, unspecified: Secondary | ICD-10-CM

## 2017-09-06 DIAGNOSIS — D61818 Other pancytopenia: Secondary | ICD-10-CM | POA: Diagnosis not present

## 2017-09-06 DIAGNOSIS — I10 Essential (primary) hypertension: Secondary | ICD-10-CM

## 2017-09-06 DIAGNOSIS — K5909 Other constipation: Secondary | ICD-10-CM | POA: Diagnosis not present

## 2017-09-06 LAB — TSH: TSH: 0.01 mIU/L — ABNORMAL LOW (ref 0.40–4.50)

## 2017-09-06 MED ORDER — VITAMIN D (ERGOCALCIFEROL) 1.25 MG (50000 UNIT) PO CAPS: 50000.0000 [IU] | ORAL_CAPSULE | ORAL | 1 refills | Status: DC

## 2017-09-06 MED ORDER — LISINOPRIL 20 MG PO TABS: 20.0000 mg | ORAL_TABLET | Freq: Every day | ORAL | 1 refills | Status: DC

## 2017-09-06 MED ORDER — LUBIPROSTONE 24 MCG PO CAPS: 24.0000 ug | ORAL_CAPSULE | Freq: Two times a day (BID) | ORAL | 5 refills | Status: DC

## 2017-09-06 MED ORDER — ROPINIROLE HCL 0.5 MG PO TABS: 0.5000 mg | ORAL_TABLET | Freq: Every day | ORAL | 1 refills | Status: DC

## 2017-09-06 MED ORDER — AMLODIPINE BESYLATE 5 MG PO TABS: 5.0000 mg | ORAL_TABLET | Freq: Every day | ORAL | 1 refills | Status: DC

## 2017-09-06 NOTE — Progress Notes (Signed)
Name: Lori Pacheco   MRN: [id number]    DOB: [birth year]   Date:09/06/2017       Progress Note  Subjective  Chief Complaint  Chief Complaint  Patient presents with  . Follow-up    6 week   . Constipation    Going daily with increase of Amitiza  . Back Pain    Doing better with gabapentin    HPI  She is here today with her daughter [name] )   Pancytopenia: seen by hematologist but after conversation with her and her daughter Oct 2018 they decided to not pursue any further testing or therapy.   HTN/CKI stage III: seeing Dr. Holley Raring. Daughter states she has been compliant with medications over the past month. She denies chest pain or palpitation, bp is elevated today, but she is busy getting ready for thanksgiving, bp at home has been well controlled.   Hypothyroidism: she is on Synthroid 137 mcg for the past 6 weeks, she is compliant with dose now, palpitation resolved, more energy, feeling better, recheck labs today.   Chronic low back pain/constipation: she is doing much better with Amitiza and Gabapentin at night, over the past few days she has been cooking more and has noticed some back pain, aching like but not severe. Taking amitiza once a twice daily and has bowel movements daily now.   RLS: she is taking requip one pill at night and she is sleeping well.   Patient Active Problem List   Diagnosis Date Noted  . Pancytopenia ([place]) 07/24/2017  . Thrombocytopenia ([place]) 05/15/2017  . Secondary renal hyperparathyroidism ([place]) 05/15/2017  . Atherosclerosis of abdominal aorta ([place]) 11/10/2016  . Other neutropenia ([place]) 11/10/2016  . Perennial allergic rhinitis with seasonal variation 11/10/2016  . Vitamin D deficiency 03/29/2016  . Hypothyroidism 09/18/2015  . Essential hypertension 05/19/2015  . Hyperlipemia 05/19/2015  . Chronic kidney disease, stage IV (severe) ([place]) 05/19/2015    Past Surgical History:  Procedure Laterality Date  . NO PAST SURGERIES      Family  History  Problem Relation Age of Onset  . Heart disease Brother   . Cancer Daughter   . Diabetes Brother   . Alcohol abuse Brother     Social History   Socioeconomic History  . Marital status: Married    Spouse name: Not on file  . Number of children: Not on file  . Years of education: Not on file  . Highest education level: Not on file  Social Needs  . Financial resource strain: Not on file  . Food insecurity - worry: Not on file  . Food insecurity - inability: Not on file  . Transportation needs - medical: Not on file  . Transportation needs - non-medical: Not on file  Occupational History  . Not on file  Tobacco Use  . Smoking status: Never Smoker  . Smokeless tobacco: Former Systems developer    Types: Snuff  Substance and Sexual Activity  . Alcohol use: No    Alcohol/week: 0.0 oz  . Drug use: No  . Sexual activity: No  Other Topics Concern  . Not on file  Social History Narrative   Lives with one daughter, she used to be very active, raking her yard, cooking, cleaning, however over the past month she states she hurts all over and cannot do it anymore.      Current Outpatient Medications:  .  acetaminophen (TYLENOL) 500 MG tablet, Take 1 tablet (500 mg total) by mouth every 6 (six)  hours as needed., Disp: 100 tablet, Rfl: 0 .  amLODipine (NORVASC) 5 MG tablet, Take 1 tablet (5 mg total) by mouth daily., Disp: 90 tablet, Rfl: 1 .  gabapentin (NEURONTIN) 300 MG capsule, Take 1 capsule (300 mg total) at bedtime by mouth. qhs, Disp: 90 capsule, Rfl: 1 .  levothyroxine (SYNTHROID, LEVOTHROID) 137 MCG tablet, Take 1 tablet (137 mcg total) by mouth daily., Disp: 90 tablet, Rfl: 1 .  lisinopril (PRINIVIL,ZESTRIL) 20 MG tablet, Take 1 tablet (20 mg total) by mouth daily., Disp: 90 tablet, Rfl: 1 .  loratadine (CLARITIN) 10 MG tablet, Take 1 tablet (10 mg total) by mouth daily., Disp: 30 tablet, Rfl: 11 .  lubiprostone (AMITIZA) 24 MCG capsule, Take 1 capsule (24 mcg total) by mouth 2  (two) times daily with a meal., Disp: 60 capsule, Rfl: 5 .  Olopatadine HCl 0.2 % SOLN, Apply 1 drop to eye daily., Disp: 2.5 mL, Rfl: 2 .  rOPINIRole (REQUIP) 0.5 MG tablet, Take 1 tablet (0.5 mg total) by mouth at bedtime., Disp: 90 tablet, Rfl: 1 .  Vitamin D, Ergocalciferol, (DRISDOL) 50000 units CAPS capsule, Take 1 capsule (50,000 Units total) by mouth once a week., Disp: 12 capsule, Rfl: 1  No Known Allergies   ROS  Constitutional: Negative for fever, positive for weight change.  Respiratory: Negative for cough and shortness of breath.   Cardiovascular: Negative for chest pain or palpitations.  Gastrointestinal: Negative for abdominal pain, no bowel changes   Musculoskeletal: Negative for gait problem or joint swelling.  Skin: Negative for rash.  Neurological: Negative for dizziness or headache.  No other specific complaints in a complete review of systems (except as listed in HPI above).  Objective  Vitals:   09/06/17 1506  BP: (!) 162/74  Pulse: 83  Resp: 18  Temp: 98.3 F (36.8 C)  TempSrc: Oral  SpO2: 98%  Weight: 116 lb 1.6 oz (52.7 kg)  Height: 5' 1" (1.549 m)    Body mass index is 21.94 kg/m.  Physical Exam  Constitutional: Patient appears well-developed and well-nourished.  No distress.  HEENT: head atraumatic, normocephalic, pupils equal and reactive to light,neck supple, throat within normal limits Cardiovascular: Normal rate, regular rhythm and normal heart sounds.  No murmur heard. No BLE edema. Pulmonary/Chest: Effort normal and breath sounds normal. No respiratory distress. Abdominal: Soft.  There is no tenderness. Psychiatric: Patient has a normal mood and affect. behavior is normal. Judgment and thought content normal.  Recent Results (from the past 2160 hour(s))  CBC with Differential/Platelet     Status: Abnormal   Collection Time: 06/26/17  3:46 PM  Result Value Ref Range   WBC 2.2 (L) 3.8 - 10.8 Thousand/uL   RBC 3.58 (L) 3.80 - 5.10  Million/uL   Hemoglobin 10.2 (L) 11.7 - 15.5 g/dL   HCT 32.0 (L) 35.0 - 45.0 %   MCV 89.4 80.0 - 100.0 fL   MCH 28.5 27.0 - 33.0 pg   MCHC 31.9 (L) 32.0 - 36.0 g/dL   RDW 12.5 11.0 - 15.0 %   Platelets 130 (L) 140 - 400 Thousand/uL   MPV 11.4 7.5 - 12.5 fL   Neutro Abs 999 (L) 1,500 - 7,800 cells/uL   Lymphs Abs 946 850 - 3,900 cells/uL   WBC mixed population 227 200 - 950 cells/uL   Eosinophils Absolute 20 15 - 500 cells/uL   Basophils Absolute 9 0 - 200 cells/uL   Neutrophils Relative % 45.4 %   Total Lymphocyte 43.0 %  Monocytes Relative 10.3 %   Eosinophils Relative 0.9 %   Basophils Relative 0.4 %  TSH     Status: Abnormal   Collection Time: 06/26/17  3:46 PM  Result Value Ref Range   TSH 0.05 (L) 0.40 - 4.50 mIU/L  Kappa/lambda light chains     Status: Abnormal   Collection Time: 07/13/17  3:50 PM  Result Value Ref Range   Kappa free light chain 26.4 (H) 3.3 - 19.4 mg/L   Lamda free light chains 34.2 (H) 5.7 - 26.3 mg/L   Kappa, lamda light chain ratio 0.77 0.26 - 1.65    Comment: (NOTE) Performed At: [facility] [place], [place] [id number] Lindon Romp MD YI:[id number]   CBC with Differential/Platelet     Status: Abnormal   Collection Time: 07/13/17  3:50 PM  Result Value Ref Range   WBC 2.4 (L) 3.6 - 11.0 K/uL   RBC 3.54 (L) 3.80 - 5.20 MIL/uL   Hemoglobin 10.5 (L) 12.0 - 16.0 g/dL   HCT 31.1 (L) 35.0 - 47.0 %   MCV 87.7 80.0 - 100.0 fL   MCH 29.6 26.0 - 34.0 pg   MCHC 33.7 32.0 - 36.0 g/dL   RDW 13.2 11.5 - 14.5 %   Platelets 129 (L) 150 - 440 K/uL   Neutrophils Relative % 49 %   Neutro Abs 1.2 (L) 1.4 - 6.5 K/uL   Lymphocytes Relative 41 %   Lymphs Abs 1.0 1.0 - 3.6 K/uL   Monocytes Relative 10 %   Monocytes Absolute 0.2 0.2 - 0.9 K/uL   Eosinophils Relative 1 %   Eosinophils Absolute 0.0 0 - 0.7 K/uL   Basophils Relative 1 %   Basophils Absolute 0.0 0 - 0.1 K/uL  Comprehensive metabolic panel     Status: Abnormal    Collection Time: 07/13/17  3:50 PM  Result Value Ref Range   Sodium 140 135 - 145 mmol/L   Potassium 5.0 3.5 - 5.1 mmol/L   Chloride 105 101 - 111 mmol/L   CO2 27 22 - 32 mmol/L   Glucose, Bld 103 (H) 65 - 99 mg/dL   BUN 43 (H) 6 - 20 mg/dL   Creatinine, Ser 1.50 (H) 0.44 - 1.00 mg/dL   Calcium 10.1 8.9 - 10.3 mg/dL   Total Protein 7.5 6.5 - 8.1 g/dL   Albumin 4.1 3.5 - 5.0 g/dL   AST 21 15 - 41 U/L   ALT 12 (L) 14 - 54 U/L   Alkaline Phosphatase 48 38 - 126 U/L   Total Bilirubin 0.7 0.3 - 1.2 mg/dL   GFR calc non Af Amer 29 (L) >60 mL/min   GFR calc Af Amer 33 (L) >60 mL/min    Comment: (NOTE) The eGFR has been calculated using the CKD EPI equation. This calculation has not been validated in all clinical situations. eGFR's persistently <60 mL/min signify possible Chronic Kidney Disease.    Anion gap 8 5 - 15  Lactate dehydrogenase     Status: Abnormal   Collection Time: 07/13/17  3:50 PM  Result Value Ref Range   LDH 195 (H) 98 - 192 U/L  Iron and TIBC     Status: None   Collection Time: 07/13/17  3:50 PM  Result Value Ref Range   Iron 61 28 - 170 ug/dL   TIBC 292 250 - 450 ug/dL   Saturation Ratios 21 10.4 - 31.8 %   UIBC 231 ug/dL  Ferritin  Status: None   Collection Time: 07/13/17  3:50 PM  Result Value Ref Range   Ferritin 240 11 - 307 ng/mL  Hepatitis panel, acute     Status: None   Collection Time: 07/13/17  3:50 PM  Result Value Ref Range   Hepatitis B Surface Ag Negative Negative   HCV Ab <0.1 0.0 - 0.9 s/co ratio    Comment: (NOTE)                                  Negative:     < 0.8                             Indeterminate: 0.8 - 0.9                                  Positive:     > 0.9 The CDC recommends that a positive HCV antibody result be followed up with a HCV Nucleic Acid Amplification test (599357). Performed At: [facility] [place], [place] [id number] Lindon Romp MD ES:[id number]    Hep A IgM Negative Negative    Hep B C IgM Negative Negative  HIV antibody     Status: None   Collection Time: 07/13/17  3:50 PM  Result Value Ref Range   HIV Screen 4th Generation wRfx Non Reactive Non Reactive    Comment: (NOTE) Performed At: [facility] [place], [place] [id number] Lindon Romp MD BW:[id number]   Flow cytometry panel-leukemia/lymphoma work-up     Status: None   Collection Time: 07/13/17  3:50 PM  Result Value Ref Range   PATH INTERP XXX-IMP Comment     Comment: (NOTE) 1)  Monocytes show phenotypic aberrancy, see comment 2)  No other significant immunophenotypic abnormality detected    ANNOTATION COMMENT IMP Comment     Comment: (NOTE) Phenotypic aberrancies of monocytes can be seen in reactive and neoplastic processes. Bone marrow evaluation and cytogenetic studies may be indicated. Clinical correlation is required.    CLINICAL INFO Comment     Comment: (NOTE) Accompanying CBC dated 07/13/17 shows pancytopenia and neutropenia: WBC count 2.4, RBC 3.5, Hgb 10.5, Hct 31.1, MCV 87.7, Neu 1.2, Lym 1.0, Mon 0.2, Plt 129K.    Misc Source Comment     Comment: Peripheral blood   ASSESSMENT OF LEUKOCYTES Comment     Comment: (NOTE) No monoclonal B cell population is detected. kappa:lambda ratio 0.8 There is no loss of, or aberrant expression of, the pan T cell antigens to suggest a neoplastic T cell process. CD4:CD8 ratio 3.3 No circulating blasts are detected. There is no immunophenotypic  evidence of abnormal myeloid maturation. Monocytes show dim/partial aberrant expression of CD56, a finding that can be seen in association with both reactive/activated processes as well as neoplastic processes. Analysis of the leukocyte population shows: granulocytes 59%, monocytes 6%, lymphocytes 35%, blasts <0.1%, B cells 4%, T cells 27%, NK cells 4%.    % Viable Cells Comment     Comment: 96%   ANALYSIS AND GATING STRATEGY Comment     Comment: 8 color analysis  with CD45/SSC gating   IMMUNOPHENOTYPING STUDY Comment     Comment: (NOTE) CD2       Normal         CD3  Normal CD4       Normal         CD5       Normal CD7       Normal         CD8       Normal CD10      Normal         CD11b     Normal CD13      Normal         CD14      Normal CD16      Normal         CD19      Normal CD20      Normal         CD33      Normal CD34      Normal         CD38      Normal CD45      Normal         CD56      See Text CD57      Normal         CD117     Normal HLA-DR    Normal         KAPPA     Normal LAMBDA    Normal         CD64      Normal    PATHOLOGIST NAME Comment     Comment: Lovett Sox, M.D.   COMMENT: Comment     Comment: (NOTE) Each antibody in this assay was utilized to assess for potential abnormalities of studied cell populations or to characterize identified abnormalities. This test was developed and its performance characteristics determined by [facility].  It has not been cleared or approved by the U.S. Food and Drug Administration. The FDA has determined that such clearance or approval is not necessary. This test is used for clinical purposes.  It should not be regarded as investigational or for research. Performed At: -[facility] [address] [place], [place] [id number] Nechama Guard MD EX:[id number] Performed At: [facility] [address] [place], [place] [id number] Nechama Guard MD YQ:[id number]   Protein electrophoresis, serum     Status: None   Collection Time: 07/13/17  3:50 PM  Result Value Ref Range   Total Protein ELP 6.8 6.0 - 8.5 g/dL   Albumin ELP 3.5 2.9 - 4.4 g/dL   Alpha-1-Globulin 0.3 0.0 - 0.4 g/dL   Alpha-2-Globulin 0.6 0.4 - 1.0 g/dL   Beta Globulin 1.0 0.7 - 1.3 g/dL   Gamma Globulin 1.5 0.4 - 1.8 g/dL   M-Spike, % Not Observed Not Observed g/dL   SPE Interp. Comment     Comment: (NOTE) The SPE pattern appears essentially unremarkable. Evidence of monoclonal protein is not  apparent. Performed At: [facility] [place], [place] [id number] Lindon Romp MD RJ:[id number]    Comment Comment     Comment: (NOTE) Protein electrophoresis scan will follow via computer, mail, or courier delivery.    GLOBULIN, TOTAL 3.3 2.2 - 3.9 g/dL   A/G Ratio 1.1 0.7 - 1.7     PHQ2/9: Depression screen [facility] 2/9 09/06/2017 06/26/2017 03/28/2017 12/19/2016 11/10/2016  Decreased Interest 0 0 0 0 0  Down, Depressed, Hopeless 0 1 0 0 0  PHQ - 2 Score 0 1 0 0 0     Fall Risk: Fall Risk  09/06/2017 07/24/2017 06/26/2017 03/28/2017 12/19/2016  Falls  in the past year? No No Yes Yes Yes  Number falls in past yr: - - 2 or more 2 or more 2 or more  Injury with Fall? - - No No No  Risk for fall due to : - - History of fall(s);Impaired balance/gait - Other (Comment)  Risk for fall due to: Comment - - - - "due to getting in a hurry"  Follow up - - - - Falls prevention discussed     Functional Status Survey: Is the patient deaf or have difficulty hearing?: Yes Does the patient have difficulty seeing, even when wearing glasses/contacts?: No Does the patient have difficulty concentrating, remembering, or making decisions?: Yes Does the patient have difficulty walking or climbing stairs?: No Does the patient have difficulty dressing or bathing?: No Does the patient have difficulty doing errands alone such as visiting a doctor's office or shopping?: Yes  Assessment & Plan  1. Essential hypertension  bp is elevated today, but usually at goal, continue current dose of medication and check at home if remains high call back and return for follow up - amLODipine (NORVASC) 5 MG tablet; Take 1 tablet (5 mg total) by mouth daily.  Dispense: 90 tablet; Refill: 1 - lisinopril (PRINIVIL,ZESTRIL) 20 MG tablet; Take 1 tablet (20 mg total) by mouth daily.  Dispense: 90 tablet; Refill: 1  2. Other constipation  Doing well on medication  - lubiprostone (AMITIZA) 24 MCG capsule;  Take 1 capsule (24 mcg total) by mouth 2 (two) times daily with a meal.  Dispense: 60 capsule; Refill: 5  3. Leg cramps  - rOPINIRole (REQUIP) 0.5 MG tablet; Take 1 tablet (0.5 mg total) by mouth at bedtime.  Dispense: 90 tablet; Refill: 1  4. Acquired hypothyroidism  - TSH  5. Pancytopenia ([place])  She cancelled follow up with hematologist   6. Secondary renal hyperparathyroidism ([place])  Keep follow up with nephrologist  - Vitamin D, Ergocalciferol, (DRISDOL) 50000 units CAPS capsule; Take 1 capsule (50,000 Units total) by mouth once a week.  Dispense: 12 capsule; Refill: 1

## 2017-09-13 ENCOUNTER — Other Ambulatory Visit: Payer: Self-pay | Admitting: Family Medicine

## 2017-09-13 DIAGNOSIS — E039 Hypothyroidism, unspecified: Secondary | ICD-10-CM

## 2017-09-13 MED ORDER — LEVOTHYROXINE SODIUM 125 MCG PO TABS: 125.0000 ug | ORAL_TABLET | Freq: Every day | ORAL | 0 refills | Status: DC

## 2017-09-18 ENCOUNTER — Inpatient Hospital Stay: Payer: Medicare Other

## 2017-09-18 ENCOUNTER — Inpatient Hospital Stay: Payer: Medicare Other | Admitting: Oncology

## 2017-11-21 ENCOUNTER — Encounter: Payer: Self-pay | Admitting: Family Medicine

## 2017-11-21 ENCOUNTER — Ambulatory Visit: Payer: Medicare Other | Admitting: Family Medicine

## 2017-11-21 VITALS — BP 146/82 | HR 74 | Temp 98.2°F | Resp 16 | Ht 61.0 in | Wt 120.8 lb

## 2017-11-21 DIAGNOSIS — K5909 Other constipation: Secondary | ICD-10-CM

## 2017-11-21 DIAGNOSIS — R109 Unspecified abdominal pain: Secondary | ICD-10-CM | POA: Diagnosis not present

## 2017-11-21 DIAGNOSIS — D61818 Other pancytopenia: Secondary | ICD-10-CM | POA: Diagnosis not present

## 2017-11-21 DIAGNOSIS — R5383 Other fatigue: Secondary | ICD-10-CM | POA: Diagnosis not present

## 2017-11-21 DIAGNOSIS — N183 Chronic kidney disease, stage 3 unspecified: Secondary | ICD-10-CM

## 2017-11-21 DIAGNOSIS — I10 Essential (primary) hypertension: Secondary | ICD-10-CM

## 2017-11-21 DIAGNOSIS — K219 Gastro-esophageal reflux disease without esophagitis: Secondary | ICD-10-CM | POA: Diagnosis not present

## 2017-11-21 DIAGNOSIS — E039 Hypothyroidism, unspecified: Secondary | ICD-10-CM | POA: Diagnosis not present

## 2017-11-21 DIAGNOSIS — R739 Hyperglycemia, unspecified: Secondary | ICD-10-CM

## 2017-11-21 MED ORDER — OMEPRAZOLE 40 MG PO CPDR: 40.0000 mg | DELAYED_RELEASE_CAPSULE | Freq: Every day | ORAL | 2 refills | Status: DC

## 2017-11-21 NOTE — Progress Notes (Signed)
Name: Lori Pacheco   MRN: [id number]    DOB: [birth year]   Date:11/21/2017       Progress Note  Subjective  Chief Complaint  Chief Complaint  Patient presents with  . Medication Refill    States she feels like something is eating up her gums  . Fatigue    Onset-1 week. Has been up and down through out the night  . Abdominal Pain    Chronic left lower abdominal pain-intermittently  . Hypothyroidism    Needs blood work today she is overdue and needs a refill of medication.    HPI   She is here today with her daughter [name] )   Other fatigue: she has noticed that she has been feeling more tired over the past week, also has noticed the intermittent indigestion, some nausea ( chronic) and has some dysphagia ( also chronic), also worried about gum line receding and dentures not fitting properly. She denies fever, vomiting, change in appetite, sob, chest pain, cough. She has gained a few pounds since last visit.   Pancytopenia: seen by hematologist but after conversation with her and her daughter Oct 2018 they decided to not pursue any further testing or therapy.   HTN/CKI stage III: seeing Dr. Holley Raring. Daughter states she has been compliant with medications over the past month. She denies chest pain or palpitation, bp is better today  Hypothyroidism: she is on Synthroid 125 mcg for the past few months, she ran out of medication three days ago, but otherwise she has been very compliant and taking it by itself in am's  Chronic low back pain/constipation: she is doing much better with Amitiza and Gabapentin at night, over the past few days she has been cooking more and has noticed some back pain, aching like but not severe. Taking amitiza once a twice daily and is doing well, she has refills at pharmacy  RLS: she is taking requip one pill at night and she is sleeping well.     Patient Active Problem List   Diagnosis Date Noted  . Pancytopenia ([place]) 07/24/2017  . Thrombocytopenia  ([place]) 05/15/2017  . Secondary renal hyperparathyroidism ([place]) 05/15/2017  . Atherosclerosis of abdominal aorta ([place]) 11/10/2016  . Other neutropenia ([place]) 11/10/2016  . Perennial allergic rhinitis with seasonal variation 11/10/2016  . Vitamin D deficiency 03/29/2016  . Hypothyroidism 09/18/2015  . Essential hypertension 05/19/2015  . Hyperlipemia 05/19/2015  . Chronic kidney disease, stage IV (severe) ([place]) 05/19/2015    Past Surgical History:  Procedure Laterality Date  . NO PAST SURGERIES      Family History  Problem Relation Age of Onset  . Heart disease Brother   . Cancer Daughter   . Diabetes Brother   . Alcohol abuse Brother     Social History   Socioeconomic History  . Marital status: Married    Spouse name: Not on file  . Number of children: Not on file  . Years of education: Not on file  . Highest education level: Not on file  Social Needs  . Financial resource strain: Not on file  . Food insecurity - worry: Not on file  . Food insecurity - inability: Not on file  . Transportation needs - medical: Not on file  . Transportation needs - non-medical: Not on file  Occupational History  . Not on file  Tobacco Use  . Smoking status: Never Smoker  . Smokeless tobacco: Former Systems developer    Types: Snuff  Substance and Sexual Activity  .  Alcohol use: No    Alcohol/week: 0.0 oz  . Drug use: No  . Sexual activity: No  Other Topics Concern  . Not on file  Social History Narrative   Lives with one daughter, she used to be very active, raking her yard, cooking, cleaning, however over the past month she states she hurts all over and cannot do it anymore.      Current Outpatient Medications:  .  acetaminophen (TYLENOL) 500 MG tablet, Take 1 tablet (500 mg total) by mouth every 6 (six) hours as needed., Disp: 100 tablet, Rfl: 0 .  amLODipine (NORVASC) 5 MG tablet, Take 1 tablet (5 mg total) by mouth daily., Disp: 90 tablet, Rfl: 1 .  gabapentin (NEURONTIN) 300 MG capsule,  Take 1 capsule (300 mg total) at bedtime by mouth. qhs, Disp: 90 capsule, Rfl: 1 .  levothyroxine (SYNTHROID, LEVOTHROID) 125 MCG tablet, Take 1 tablet (125 mcg total) by mouth daily., Disp: 60 tablet, Rfl: 0 .  lisinopril (PRINIVIL,ZESTRIL) 20 MG tablet, Take 1 tablet (20 mg total) by mouth daily., Disp: 90 tablet, Rfl: 1 .  loratadine (CLARITIN) 10 MG tablet, Take 1 tablet (10 mg total) by mouth daily., Disp: 30 tablet, Rfl: 11 .  lubiprostone (AMITIZA) 24 MCG capsule, Take 1 capsule (24 mcg total) by mouth 2 (two) times daily with a meal., Disp: 60 capsule, Rfl: 5 .  Olopatadine HCl 0.2 % SOLN, Apply 1 drop to eye daily., Disp: 2.5 mL, Rfl: 2 .  rOPINIRole (REQUIP) 0.5 MG tablet, Take 1 tablet (0.5 mg total) by mouth at bedtime., Disp: 90 tablet, Rfl: 1 .  Vitamin D, Ergocalciferol, (DRISDOL) 50000 units CAPS capsule, Take 1 capsule (50,000 Units total) by mouth once a week., Disp: 12 capsule, Rfl: 1 .  omeprazole (PRILOSEC) 40 MG capsule, Take 1 capsule (40 mg total) by mouth daily. Before supper, Disp: 30 capsule, Rfl: 2  No Known Allergies   ROS  Constitutional: Negative for fever or weight change.  Respiratory: Negative for cough and shortness of breath.   Cardiovascular: Negative for chest pain or palpitations.  Gastrointestinal: Positive for abdominal pain, no bowel changes.  Musculoskeletal: Negative for gait problem or joint swelling.  Skin: Negative for rash.  Neurological: Negative for dizziness or headache.  No other specific complaints in a complete review of systems (except as listed in HPI above).   Objective  Vitals:   11/21/17 1443  BP: (!) 146/82  Pulse: 74  Resp: 16  Temp: 98.2 F (36.8 C)  TempSrc: Oral  SpO2: 98%  Weight: 120 lb 12.8 oz (54.8 kg)  Height: 5\' 1"  (1.549 m)    Body mass index is 22.82 kg/m.  Physical Exam  Constitutional: Patient appears well-developed and very thin.  No distress.  HEENT: head atraumatic, normocephalic, pupils equal  and reactive to light,neck supple, throat within normal limits Cardiovascular: Normal rate, regular rhythm and normal heart sounds.  No murmur heard. No BLE edema. Pulmonary/Chest: Effort normal and breath sounds normal. No respiratory distress. Abdominal: Soft.  There is no tenderness. Normal bowel sounds Psychiatric: Patient has a normal mood and affect. behavior is normal. Judgment and thought content normal.    Recent Results (from the past 2160 hour(s))  TSH     Status: Abnormal   Collection Time: 09/06/17  3:39 PM  Result Value Ref Range   TSH 0.01 (L) 0.40 - 4.50 mIU/L      PHQ2/9: Depression screen [facility] 2/9 11/21/2017 09/06/2017 06/26/2017 03/28/2017 12/19/2016  Decreased Interest  0 0 0 0 0  Down, Depressed, Hopeless 0 0 1 0 0  PHQ - 2 Score 0 0 1 0 0    Fall Risk: Fall Risk  11/21/2017 09/06/2017 07/24/2017 06/26/2017 03/28/2017  Falls in the past year? Yes No No Yes Yes  Number falls in past yr: 2 or more - - 2 or more 2 or more  Injury with Fall? No - - No No  Risk for fall due to : - - - History of fall(s);Impaired balance/gait -  Risk for fall due to: Comment - - - - -  Follow up - - - - -       Functional Status Survey: Is the patient deaf or have difficulty hearing?: No Does the patient have difficulty seeing, even when wearing glasses/contacts?: No Does the patient have difficulty concentrating, remembering, or making decisions?: Yes Does the patient have difficulty walking or climbing stairs?: No Does the patient have difficulty dressing or bathing?: No Does the patient have difficulty doing errands alone such as visiting a doctor's office or shopping?: Yes    Assessment & Plan  1. Essential hypertension  Better controlled today   2. Other constipation  Doing well on medication  3. Acquired hypothyroidism   4. Pancytopenia ([place])  We will not check labs, since seen by hematologist   5. Hypothyroidism, unspecified type  - TSH  6. Intermittent  abdominal pain  It seems to be secondary to gerd, she is a poor historian  7. CKD (chronic kidney disease), stage III ([place])   8. Hyperglycemia   9. Gastroesophageal reflux disease without esophagitis  - omeprazole (PRILOSEC) 40 MG capsule; Take 1 capsule (40 mg total) by mouth daily. Before supper  Dispense: 30 capsule; Refill: 2  10. Other fatigue  - Urine Culture - Comprehensive metabolic panel

## 2017-11-22 LAB — COMPREHENSIVE METABOLIC PANEL
AG Ratio: 1.6 (calc) (ref 1.0–2.5)
ALBUMIN MSPROF: 4.2 g/dL (ref 3.6–5.1)
ALKALINE PHOSPHATASE (APISO): 75 U/L (ref 33–130)
ALT: 10 U/L (ref 6–29)
AST: 17 U/L (ref 10–35)
BUN/Creatinine Ratio: 18 (calc) (ref 6–22)
BUN: 25 mg/dL (ref 7–25)
CHLORIDE: 105 mmol/L (ref 98–110)
CO2: 27 mmol/L (ref 20–32)
Calcium: 9.6 mg/dL (ref 8.6–10.4)
Creat: 1.39 mg/dL — ABNORMAL HIGH (ref 0.60–0.88)
GLOBULIN: 2.7 g/dL (ref 1.9–3.7)
Glucose, Bld: 89 mg/dL (ref 65–139)
POTASSIUM: 4.8 mmol/L (ref 3.5–5.3)
Sodium: 140 mmol/L (ref 135–146)
Total Bilirubin: 0.5 mg/dL (ref 0.2–1.2)
Total Protein: 6.9 g/dL (ref 6.1–8.1)

## 2017-11-22 LAB — URINE CULTURE
MICRO NUMBER: 90155777
SPECIMEN QUALITY: ADEQUATE

## 2017-11-22 LAB — TSH: TSH: 0.02 m[IU]/L — AB (ref 0.40–4.50)

## 2017-11-24 ENCOUNTER — Other Ambulatory Visit: Payer: Self-pay | Admitting: Family Medicine

## 2017-11-24 DIAGNOSIS — E039 Hypothyroidism, unspecified: Secondary | ICD-10-CM

## 2017-11-24 MED ORDER — LEVOTHYROXINE SODIUM 112 MCG PO TABS
112.0000 ug | ORAL_TABLET | Freq: Every day | ORAL | 1 refills | Status: DC
Start: 2017-11-24 — End: 2018-02-21

## 2017-11-28 ENCOUNTER — Telehealth: Payer: Self-pay | Admitting: Family Medicine

## 2017-11-28 NOTE — Telephone Encounter (Signed)
Pt daughter given results per notes of Dr. Ancil Boozer on 11/24/17, she verbalized understanding. She says the she will pick up the labs next time they are in the office, so keep them upfront. Unable to document in result note due to result note not routed to [facility].

## 2017-12-11 ENCOUNTER — Ambulatory Visit: Payer: Medicare Other | Admitting: Family Medicine

## 2017-12-13 ENCOUNTER — Telehealth: Payer: Self-pay

## 2017-12-13 NOTE — Telephone Encounter (Signed)
Called pt to sched AWV w/ [facility]. Appt has been scheduled for 12/22/17

## 2017-12-22 ENCOUNTER — Ambulatory Visit: Payer: Medicare Other

## 2017-12-22 ENCOUNTER — Ambulatory Visit (INDEPENDENT_AMBULATORY_CARE_PROVIDER_SITE_OTHER): Payer: Medicare Other | Admitting: Family Medicine

## 2017-12-22 ENCOUNTER — Encounter: Payer: Self-pay | Admitting: Family Medicine

## 2017-12-22 VITALS — BP 180/80 | HR 68 | Temp 97.4°F | Resp 12 | Ht 61.0 in | Wt 122.6 lb

## 2017-12-22 VITALS — BP 164/70 | HR 68 | Temp 97.4°F | Resp 12 | Ht 61.0 in | Wt 122.0 lb

## 2017-12-22 DIAGNOSIS — Z0000 Encounter for general adult medical examination without abnormal findings: Secondary | ICD-10-CM | POA: Diagnosis not present

## 2017-12-22 DIAGNOSIS — I10 Essential (primary) hypertension: Secondary | ICD-10-CM

## 2017-12-22 MED ORDER — HYDRALAZINE HCL 10 MG PO TABS: 10.0000 mg | ORAL_TABLET | Freq: Four times a day (QID) | ORAL | 1 refills | Status: DC | PRN

## 2017-12-22 NOTE — Patient Instructions (Addendum)
-   Take 10mg  (1 tablet) Hydralazine every 6 hours if blood pressure is above 160/90.  Continue daily Lisinopril in the morning and Amlodipine at night. - Please check blood pressure every 6 hours

## 2017-12-22 NOTE — Progress Notes (Addendum)
Subjective:   Lori Pacheco is a 84 y.o. female who presents for Medicare Annual (Subsequent) preventive examination.  Review of Systems:  N/A Cardiac Risk Factors include: advanced age (>42men, >58 women);dyslipidemia;hypertension     Objective:     Vitals: BP (!) 180/80 (BP Location: Right Arm, Patient Position: Sitting, Cuff Size: Normal)   Pulse 68   Temp (!) 97.4 F (36.3 C) (Oral)   Resp 12   Ht 5\' 1"  (1.549 m)   Wt 122 lb 9.6 oz (55.6 kg)   SpO2 94%   BMI 23.17 kg/m   Body mass index is 23.17 kg/m.   Obtained the following B/P's: 180/80, R arm and 160/80 L arm, both pressures were obtained while pt was sitting and with a normal b/p cuff @ 3:37pm. Denies chest pain, dyspnea, dizziness, headache or tingling in extremities. Confirmed she has taken both Norvasc and Lisinopril today as prescribed. Results of b/p's discussed with Dr. Ancil Boozer. Advised pt will need further evaluation today by Raelyn Ensign, FNP to determine if an increase of pt antihypertensives would be required. Dtr accompanied pt today to appt and was made aware of Dr. Ancil Boozer recommendation. Verbalized acceptance and understanding.  Upon completion of my phone call with Dr. Ancil Boozer, a recheck of pt b/p's were obtained as follows: 180/82, R arm and 164/70 L arm, both pressures were obtained while pt was sitting and with a normal b/p cuff @ 4:00pm.   Advanced Directives 12/22/2017 07/21/2017 06/26/2017 05/11/2017 03/28/2017 12/19/2016 11/10/2016  Does Patient Have a Medical Advance Directive? No No No No No No Yes  Type of Advance Directive - - - - - - Press photographer  Does patient want to make changes to medical advance directive? - - - - - - -  Copy of [place] in Chart? - - - - - - -  Would patient like information on creating a medical advance directive? Yes (MAU/Ambulatory/Procedural Areas - Information given) Yes (MAU/Ambulatory/Procedural Areas - Information given) - - - Yes (ED -  Information included in AVS) -    Tobacco Social History   Tobacco Use  Smoking Status Never Smoker  Smokeless Tobacco Former Systems developer  . Types: Snuff  Tobacco Comment   smoking cessation materials not required     Counseling given: No Comment: smoking cessation materials not required   Clinical Intake:  Pre-visit preparation completed: Yes  Pain : No/denies pain   BMI - recorded: 23.17 Nutritional Risks: None Diabetes: No  How often do you need to have someone help you when you read instructions, pamphlets, or other written materials from your doctor or pharmacy?: 1 - Never  Interpreter Needed?: No  Information entered by :: [place], LPN  Hospitalizations, surgeries, and ER visits occurring within the previous 12 months:  Within the previous 12 months, pt has not underwent any surgical procedures and has not been hospitalized for any conditions. Pt has been seen in the ED on 05/11/17 for abd pain and treated by Dr. Burlene Arnt for this condition.  In addition to the above listed ED visit, pt was seen in follow up by Dr. Ancil Boozer on 05/15/17. Planning included: no further treatments or recommendations for abd pain, ambulatory referral to Nephrology (Dr. Holley Raring), recheck thrombocytopenia in 6 wks, dtr and pt declined statin tx't for atherosclerosis of abd aorta.   Past Medical History:  Diagnosis Date  . Hyperlipidemia   . Hypertension   . Thyroid disease    Past Surgical History:  Procedure Laterality Date  . NO PAST SURGERIES     Family History  Problem Relation Age of Onset  . Healthy Mother   . Healthy Father   . Heart disease Brother   . Cancer Brother   . Cancer Daughter   . Alcohol abuse Son   . Diabetes Son   . Heart disease Son    Social History   Socioeconomic History  . Marital status: Widowed    Spouse name: Jeneen Rinks  . Number of children: 9  . Years of education: None  . Highest education level: 3rd grade  Social Needs  . Financial resource strain:  Not hard at all  . Food insecurity - worry: Never true  . Food insecurity - inability: Never true  . Transportation needs - medical: No  . Transportation needs - non-medical: No  Occupational History  . Occupation: Retired  Tobacco Use  . Smoking status: Never Smoker  . Smokeless tobacco: Former Systems developer    Types: Snuff  . Tobacco comment: smoking cessation materials not required  Substance and Sexual Activity  . Alcohol use: No    Alcohol/week: 0.0 oz  . Drug use: No  . Sexual activity: No  Other Topics Concern  . None  Social History Narrative        Outpatient Encounter Medications as of 12/22/2017  Medication Sig  . acetaminophen (TYLENOL) 500 MG tablet Take 1 tablet (500 mg total) by mouth every 6 (six) hours as needed.  Marland Kitchen amLODipine (NORVASC) 5 MG tablet Take 1 tablet (5 mg total) by mouth daily.  Marland Kitchen gabapentin (NEURONTIN) 300 MG capsule Take 1 capsule (300 mg total) at bedtime by mouth. qhs  . levothyroxine (SYNTHROID, LEVOTHROID) 112 MCG tablet Take 1 tablet (112 mcg total) by mouth daily.  Marland Kitchen lisinopril (PRINIVIL,ZESTRIL) 20 MG tablet Take 1 tablet (20 mg total) by mouth daily.  Marland Kitchen loratadine (CLARITIN) 10 MG tablet Take 1 tablet (10 mg total) by mouth daily.  Marland Kitchen lubiprostone (AMITIZA) 24 MCG capsule Take 1 capsule (24 mcg total) by mouth 2 (two) times daily with a meal.  . Olopatadine HCl 0.2 % SOLN Apply 1 drop to eye daily.  Marland Kitchen omeprazole (PRILOSEC) 40 MG capsule Take 1 capsule (40 mg total) by mouth daily. Before supper  . rOPINIRole (REQUIP) 0.5 MG tablet Take 1 tablet (0.5 mg total) by mouth at bedtime.  . Vitamin D, Ergocalciferol, (DRISDOL) 50000 units CAPS capsule Take 1 capsule (50,000 Units total) by mouth once a week. (Patient not taking: Reported on 12/22/2017)   No facility-administered encounter medications on file as of 12/22/2017.     Activities of Daily Living In your present state of health, do you have any difficulty performing the following activities: 12/22/2017  11/21/2017  Hearing? Y N  Comment tinnitus; denies hearing aids -  Vision? N N  Comment denies eyeglasses -  Difficulty concentrating or making decisions? N Y  Walking or climbing stairs? N N  Dressing or bathing? N N  Doing errands, shopping? Y Y  Comment dtr tranpsorts for appts -  Conservation officer, nature and eating ? N -  Comment has dentures but doesn't wear them -  Using the Toilet? N -  In the past six months, have you accidently leaked urine? N -  Do you have problems with loss of bowel control? N -  Managing your Medications? Y -  Comment dtr manages medications -  Managing your Finances? Y -  Comment dtr manages finances -  Housekeeping or  managing your Housekeeping? N -  Some recent data might be hidden    Patient Care Team: Steele Sizer, MD as PCP - General (Family Medicine) Anthonette Legato, MD as Consulting Physician (Nephrology)    Assessment:   This is a routine wellness examination for [place].  Exercise Activities and Dietary recommendations Current Exercise Habits: Home exercise routine, Type of exercise: walking;Other - see comments(chops her own wood), Time (Minutes): 60, Frequency (Times/Week): 7, Weekly Exercise (Minutes/Week): 420, Intensity: Mild, Exercise limited by: None identified  Goals    . DIET - INCREASE WATER INTAKE     Recommend to drink at least 6-8 8oz glasses of water per day.       Fall Risk Fall Risk  12/22/2017 11/21/2017 09/06/2017 07/24/2017 06/26/2017  Falls in the past year? Yes Yes No No Yes  Comment tripped when walking - - - -  Number falls in past yr: 2 or more 2 or more - - 2 or more  Injury with Fall? No No - - No  Risk Factor Category  High Fall Risk - - - -  Comment shuffling gait - - - -  Risk for fall due to : History of fall(s) - - - History of fall(s);Impaired balance/gait  Risk for fall due to: Comment - - - - -  Follow up Falls evaluation completed;Education provided;Falls prevention discussed - - - -   Is the patient's home  free of loose throw rugs in walkways, pet beds, electrical cords, etc?   Yes Does the patient have any grab bars in the bathroom? No  Does the patient use a shower chair when bathing? No Does the patient have any stairs in or around the home? Yes If so, are there any handrails?  Yes Does the patient have adequate lighting?  Yes Does the patient use a cane, walker or w/c? No Does the patient use of an elevated toilet seat? No  Timed Get Up and Go Performed: Yes. Pt ambulated 10 feet within 20 sec. Gait slow, steady and with a slight shuffling style gait. No use of assistive device during ambulation. No intervention required at this time. Fall risk prevention has been discussed.  Pt declined my offer to send Community Resource Referral to Care Guide for  installation of grab bars in the shower, shower chair or an elevated toilet seat.  Depression Screen PHQ 2/9 Scores 12/22/2017 12/22/2017 11/21/2017 09/06/2017  PHQ - 2 Score 0 0 0 0  PHQ- 9 Score 0 - - -     Cognitive Function     6CIT Screen 12/22/2017 12/19/2016  What Year? 4 points 4 points  What month? 0 points 0 points  What time? 0 points 3 points  Count back from 20 4 points 4 points  Months in reverse 4 points 4 points  Repeat phrase 10 points 8 points  Total Score 22 23    Immunization History  Administered Date(s) Administered  . Influenza, High Dose Seasonal PF 09/17/2015, 08/22/2016, 06/26/2017  . Influenza-Unspecified 09/02/2014  . Pneumococcal Conjugate-13 11/14/2013, 09/27/2016  . Pneumococcal Polysaccharide-23 11/29/2011  . Tdap 07/06/2012  . Zoster 05/29/2008    Qualifies for Shingles Vaccine? Yes. Due for Zostavax or Shingrix vaccine. Education has been provided regarding the importance of this vaccine. Pt has been advised to call her insurance company to determine her out of pocket expense. Advised she may also receive this vaccine at her local pharmacy or Health Dept. Verbalized acceptance and  understanding.  Screening Tests Health Maintenance  Topic Date Due  . TETANUS/TDAP  07/06/2022  . INFLUENZA VACCINE  Completed  . DEXA SCAN  Completed  . PNA vac Low Risk Adult  Completed    Cancer Screenings: Lung: Low Dose CT Chest recommended if Age 35-80 years, 30 pack-year currently smoking OR have quit w/in 15years. Patient does not qualify. Breast Screenings no longer required Bone Density Screening no longer required Colorectal Screenings no longer required  Additional Screenings: Hepatitis B/HIV/Syphillis: Does not qualify Hepatitis C Screening: Does not qualify     Plan:  I have personally reviewed and addressed the Medicare Annual Wellness questionnaire and have noted the following in the patient's chart:  A. Medical and social history B. Use of alcohol, tobacco or illicit drugs  C. Current medications and supplements D. Functional ability and status E.  Nutritional status F.  Physical activity G. Advance directives H. List of other physicians I.  Hospitalizations, surgeries, and ER visits in previous 12 months J.  [place] such as hearing and vision if needed, cognitive and depression L. Referrals and appointments - none  In addition, I have reviewed and discussed with patient certain preventive protocols, quality metrics, and best practice recommendations. A written personalized care plan for preventive services as well as general preventive health recommendations were provided to patient.  See attached scanned questionnaire for additional information.   Signed,  Aleatha Borer, LPN Nurse Health Advisor  I have reviewed this encounter including the documentation in this note and/or discussed this patient with the provider, Aleatha Borer, LPN. I am certifying that I agree with the content of this note as supervising physician.  Steele Sizer, MD [facility] 12/24/2017, 7:08 PM

## 2017-12-22 NOTE — Patient Instructions (Signed)
Lori Pacheco , Thank you for taking time to come for your Medicare Wellness Visit. I appreciate your ongoing commitment to your health goals. Please review the following plan we discussed and let me know if I can assist you in the future.   Screening recommendations/referrals: Colorectal Screening: No longer required Mammogram: No longer required Bone Density: No longer required Lung Cancer Screening: No longer required Hepatitis C Screening: No longer required HIV/Syphilis/Hepatitis B Screening: No longer required   Vision/Dental Exams: Recommended yearly ophthalmology/optometry visit for glaucoma screening and checkup Recommended yearly dental visit for hygiene and checkup  Vaccinations: Influenza vaccine: Up to date Pneumococcal vaccine: Completed series Tdap vaccine: Up to date Shingles vaccine: Please call your insurance company to determine your out of pocket expense for the Shingrix vaccine. You may also receive this vaccine at your local pharmacy or Health Dept.   Advanced directives: Advance directive discussed with you today. I have provided a copy for you to complete at home and have notarized. Once this is complete please bring a copy in to our office so we can scan it into your chart.  Conditions/risks identified: Recommend to drink at least 6-8 8oz glasses of water per day.  Next appointment: You are scheduled to see Dr. Ancil Boozer on 02/21/18 @ 3:20pm.   Please schedule your Annual Wellness Visit with your Nurse Health Advisor in one year.  Preventive Care 39 Years and Older, Female Preventive care refers to lifestyle choices and visits with your health care provider that can promote health and wellness. What does preventive care include?  A yearly physical exam. This is also called an annual well check.  Dental exams once or twice a year.  Routine eye exams. Ask your health care provider how often you should have your eyes checked.  Personal lifestyle choices,  including:  Daily care of your teeth and gums.  Regular physical activity.  Eating a healthy diet.  Avoiding tobacco and drug use.  Limiting alcohol use.  Practicing safe sex.  Taking low-dose aspirin every day.  Taking vitamin and mineral supplements as recommended by your health care provider. What happens during an annual well check? The services and screenings done by your health care provider during your annual well check will depend on your age, overall health, lifestyle risk factors, and family history of disease. Counseling  Your health care provider may ask you questions about your:  Alcohol use.  Tobacco use.  Drug use.  Emotional well-being.  Home and relationship well-being.  Sexual activity.  Eating habits.  History of falls.  Memory and ability to understand (cognition).  Work and work Statistician.  Reproductive health. Screening  You may have the following tests or measurements:  Height, weight, and BMI.  Blood pressure.  Lipid and cholesterol levels. These may be checked every 5 years, or more frequently if you are over 33 years old.  Skin check.  Lung cancer screening. You may have this screening every year starting at age 76 if you have a 30-pack-year history of smoking and currently smoke or have quit within the past 15 years.  Fecal occult blood test (FOBT) of the stool. You may have this test every year starting at age 74.  Flexible sigmoidoscopy or colonoscopy. You may have a sigmoidoscopy every 5 years or a colonoscopy every 10 years starting at age 83.  Hepatitis C blood test.  Hepatitis B blood test.  Sexually transmitted disease (STD) testing.  Diabetes screening. This is done by checking your blood sugar (  glucose) after you have not eaten for a while (fasting). You may have this done every 1-3 years.  Bone density scan. This is done to screen for osteoporosis. You may have this done starting at age 62.  Mammogram. This  may be done every 1-2 years. Talk to your health care provider about how often you should have regular mammograms. Talk with your health care provider about your test results, treatment options, and if necessary, the need for more tests. Vaccines  Your health care provider may recommend certain vaccines, such as:  Influenza vaccine. This is recommended every year.  Tetanus, diphtheria, and acellular pertussis (Tdap, Td) vaccine. You may need a Td booster every 10 years.  Zoster vaccine. You may need this after age 64.  Pneumococcal 13-valent conjugate (PCV13) vaccine. One dose is recommended after age 73.  Pneumococcal polysaccharide (PPSV23) vaccine. One dose is recommended after age 20. Talk to your health care provider about which screenings and vaccines you need and how often you need them. This information is not intended to replace advice given to you by your health care provider. Make sure you discuss any questions you have with your health care provider. Document Released: 10/30/2015 Document Revised: 06/22/2016 Document Reviewed: 08/04/2015 Elsevier Interactive Patient Education  2017 [place] Prevention in the Home Falls can cause injuries. They can happen to people of all ages. There are many things you can do to make your home safe and to help prevent falls. What can I do on the outside of my home?  Regularly fix the edges of walkways and driveways and fix any cracks.  Remove anything that might make you trip as you walk through a door, such as a raised step or threshold.  Trim any bushes or trees on the path to your home.  Use bright outdoor lighting.  Clear any walking paths of anything that might make someone trip, such as rocks or tools.  Regularly check to see if handrails are loose or broken. Make sure that both sides of any steps have handrails.  Any raised decks and porches should have guardrails on the edges.  Have any leaves, snow, or ice cleared  regularly.  Use sand or salt on walking paths during winter.  Clean up any spills in your garage right away. This includes oil or grease spills. What can I do in the bathroom?  Use night lights.  Install grab bars by the toilet and in the tub and shower. Do not use towel bars as grab bars.  Use non-skid mats or decals in the tub or shower.  If you need to sit down in the shower, use a plastic, non-slip stool.  Keep the floor dry. Clean up any water that spills on the floor as soon as it happens.  Remove soap buildup in the tub or shower regularly.  Attach bath mats securely with double-sided non-slip rug tape.  Do not have throw rugs and other things on the floor that can make you trip. What can I do in the bedroom?  Use night lights.  Make sure that you have a light by your bed that is easy to reach.  Do not use any sheets or blankets that are too big for your bed. They should not hang down onto the floor.  Have a firm chair that has side arms. You can use this for support while you get dressed.  Do not have throw rugs and other things on the floor that can make  you trip. What can I do in the kitchen?  Clean up any spills right away.  Avoid walking on wet floors.  Keep items that you use a lot in easy-to-reach places.  If you need to reach something above you, use a strong step stool that has a grab bar.  Keep electrical cords out of the way.  Do not use floor polish or wax that makes floors slippery. If you must use wax, use non-skid floor wax.  Do not have throw rugs and other things on the floor that can make you trip. What can I do with my stairs?  Do not leave any items on the stairs.  Make sure that there are handrails on both sides of the stairs and use them. Fix handrails that are broken or loose. Make sure that handrails are as long as the stairways.  Check any carpeting to make sure that it is firmly attached to the stairs. Fix any carpet that is loose  or worn.  Avoid having throw rugs at the top or bottom of the stairs. If you do have throw rugs, attach them to the floor with carpet tape.  Make sure that you have a light switch at the top of the stairs and the bottom of the stairs. If you do not have them, ask someone to add them for you. What else can I do to help prevent falls?  Wear shoes that:  Do not have high heels.  Have rubber bottoms.  Are comfortable and fit you well.  Are closed at the toe. Do not wear sandals.  If you use a stepladder:  Make sure that it is fully opened. Do not climb a closed stepladder.  Make sure that both sides of the stepladder are locked into place.  Ask someone to hold it for you, if possible.  Clearly mark and make sure that you can see:  Any grab bars or handrails.  First and last steps.  Where the edge of each step is.  Use tools that help you move around (mobility aids) if they are needed. These include:  Canes.  Walkers.  Scooters.  Crutches.  Turn on the lights when you go into a dark area. Replace any light bulbs as soon as they burn out.  Set up your furniture so you have a clear path. Avoid moving your furniture around.  If any of your floors are uneven, fix them.  If there are any pets around you, be aware of where they are.  Review your medicines with your doctor. Some medicines can make you feel dizzy. This can increase your chance of falling. Ask your doctor what other things that you can do to help prevent falls. This information is not intended to replace advice given to you by your health care provider. Make sure you discuss any questions you have with your health care provider. Document Released: 07/30/2009 Document Revised: 03/10/2016 Document Reviewed: 11/07/2014 Elsevier Interactive Patient Education  2017 [place] provider would like to you have your annual eye exam. Please contact your current eye doctor to schedule your annual eye exam to  evaluate the health of your eyes. If you have not yet established care with a physician to evaluate the health of your eyes, below is a list of physicians for you to contact and to establish care.   [facility] Address: [address] [place], [place] [address]   Address:  [address] [place], [place] [address]  Phone: [phone]      Phone: [phone]  Website: [email or web address]    Website: [email or web address]     [facility]  Address: [place], [place], [place] [address]   Address: [place], [place], [place] [address] Phone: [phone]      Phone: [phone]    [facility] Address: [place], [place], [place] [address]  Phone: [phone]

## 2017-12-22 NOTE — Progress Notes (Signed)
Name: Lori Pacheco   MRN: [id number]    DOB: 1920-10-19   Date:12/22/2017       Progress Note  Subjective  Chief Complaint  Chief Complaint  Patient presents with  . Hypertension    HPI PT presents with daughter - [name] assists with the history  PT presents for elevated BP reading while at her Medicare AWV.  She is taking Lisinopril 20mg  and Amlodipine 5mg . She has chronic BLE edema - worse on the LEFT, but this has not worsened recently. Denies chest pain, shortness of breath, palpitations, headaches, facial droop, abnormal confusion, extremity weakness or any changes in her physical or mental status in any way.  Daughter states she is unsure if pt took her BP meds last night, pt states she did take them.  Incontinence - Hydralazine 10mg  every 6 hours as needed    Patient Active Problem List   Diagnosis Date Noted  . Pancytopenia ([place]) 07/24/2017  . Thrombocytopenia ([place]) 05/15/2017  . Secondary renal hyperparathyroidism ([place]) 05/15/2017  . Atherosclerosis of abdominal aorta ([place]) 11/10/2016  . Other neutropenia ([place]) 11/10/2016  . Perennial allergic rhinitis with seasonal variation 11/10/2016  . Vitamin D deficiency 03/29/2016  . Hypothyroidism 09/18/2015  . Essential hypertension 05/19/2015  . Hyperlipemia 05/19/2015  . Chronic kidney disease, stage IV (severe) ([place]) 05/19/2015    Social History   Tobacco Use  . Smoking status: Never Smoker  . Smokeless tobacco: Former Systems developer    Types: Snuff  . Tobacco comment: smoking cessation materials not required  Substance Use Topics  . Alcohol use: No    Alcohol/week: 0.0 oz     Current Outpatient Medications:  .  acetaminophen (TYLENOL) 500 MG tablet, Take 1 tablet (500 mg total) by mouth every 6 (six) hours as needed., Disp: 100 tablet, Rfl: 0 .  amLODipine (NORVASC) 5 MG tablet, Take 1 tablet (5 mg total) by mouth daily., Disp: 90 tablet, Rfl: 1 .  gabapentin (NEURONTIN) 300 MG capsule, Take 1 capsule (300 mg  total) at bedtime by mouth. qhs, Disp: 90 capsule, Rfl: 1 .  levothyroxine (SYNTHROID, LEVOTHROID) 112 MCG tablet, Take 1 tablet (112 mcg total) by mouth daily., Disp: 30 tablet, Rfl: 1 .  lisinopril (PRINIVIL,ZESTRIL) 20 MG tablet, Take 1 tablet (20 mg total) by mouth daily., Disp: 90 tablet, Rfl: 1 .  loratadine (CLARITIN) 10 MG tablet, Take 1 tablet (10 mg total) by mouth daily., Disp: 30 tablet, Rfl: 11 .  lubiprostone (AMITIZA) 24 MCG capsule, Take 1 capsule (24 mcg total) by mouth 2 (two) times daily with a meal., Disp: 60 capsule, Rfl: 5 .  Olopatadine HCl 0.2 % SOLN, Apply 1 drop to eye daily., Disp: 2.5 mL, Rfl: 2 .  omeprazole (PRILOSEC) 40 MG capsule, Take 1 capsule (40 mg total) by mouth daily. Before supper, Disp: 30 capsule, Rfl: 2 .  rOPINIRole (REQUIP) 0.5 MG tablet, Take 1 tablet (0.5 mg total) by mouth at bedtime., Disp: 90 tablet, Rfl: 1 .  Vitamin D, Ergocalciferol, (DRISDOL) 50000 units CAPS capsule, Take 1 capsule (50,000 Units total) by mouth once a week., Disp: 12 capsule, Rfl: 1 .  hydrALAZINE (APRESOLINE) 10 MG tablet, Take 1 tablet (10 mg total) by mouth every 6 (six) hours as needed. IF BP IS ABOVE 160/90, Disp: 30 tablet, Rfl: 1  No Known Allergies  ROS  Constitutional: Negative for fever or weight change.  Respiratory: Negative for cough and shortness of breath.   Cardiovascular: Negative for chest pain or palpitations.  Gastrointestinal: Negative for abdominal pain, no bowel changes.  Musculoskeletal: Negative for gait problem or joint swelling.  Skin: Negative for rash.  Neurological: Negative for dizziness or headache.  No other specific complaints in a complete review of systems (except as listed in HPI above).  Objective  Vitals:   12/22/17 1624 12/22/17 1656  BP: (!) 180/82 (!) 164/70  Pulse: 68   Resp: 12   Temp: (!) 97.4 F (36.3 C)   TempSrc: Oral   Weight: 122 lb (55.3 kg)   Height: 5\' 1"  (1.549 m)    Body mass index is 23.05  kg/m.  Nursing Note and Vital Signs reviewed.  Physical Exam Constitutional: Patient appears well-developed and well-nourished.  No distress.  HEENT: head atraumatic, normocephalic, pupils equal and reactive to light, EOM's intact Cardiovascular: Normal rate, regular rhythm, S1/S2 present.  No murmur or rub heard. +2 BLE on LLE, Trace on RLE Pulmonary/Chest: Effort normal and breath sounds clear. No respiratory distress or retractions. Psychiatric: Patient has a normal mood and affect. behavior is normal. Judgment and thought content normal. Neurological: she is alert and oriented to person, place, and situation. No cranial nerve deficit. Coordination, balance, strength, speech and gait are baseline.   No results found for this or any previous visit (from the past 72 hour(s)).  Assessment & Plan  1. Essential hypertension - hydrALAZINE (APRESOLINE) 10 MG tablet; Take 1 tablet (10 mg total) by mouth every 6 (six) hours as needed. IF BP IS ABOVE 160/90  Dispense: 30 tablet; Refill: 1 - Continue daily medications - lisinopril 20mg  QAM and amlodipine 5mg  QHS.  - Daughter agreeable to check BP every 6 hours and will administer hydralazine Q6H PRN if BP >160/90.   - Stroke symptoms reviewed in detail. Advised to call physician on call or present for emergency care if any new symptoms occur or if unclear regarding medication instructions. - Due to pt report of incontinence and CKD STage IV, we will avoid diuretic for the time being.  Discussed patient case in detail with PCP Dr. Ancil Boozer who is in agreement with plan of care. -Red flags and when to present for emergency care or RTC including fever >101.73F, chest pain, shortness of breath, new/worsening/un-resolving symptoms, facial droop, confusion, extremity weakness, headaches reviewed with patient at time of visit. Follow up and care instructions discussed and provided in AVS.

## 2017-12-25 ENCOUNTER — Ambulatory Visit (INDEPENDENT_AMBULATORY_CARE_PROVIDER_SITE_OTHER): Payer: Medicare Other

## 2017-12-25 DIAGNOSIS — I10 Essential (primary) hypertension: Secondary | ICD-10-CM

## 2017-12-25 NOTE — Progress Notes (Signed)
Patient is here for a blood pressure check. Patient denies chest pain, palpitations, shortness of breath or visual disturbances. At previous visit blood pressure was 164/70 with a heart rate of 68 . Today during nurse visit first check blood pressure was 134/64 and her pulse was 92. She continues to take blood pressure medications as prescribed.

## 2018-02-08 ENCOUNTER — Other Ambulatory Visit: Payer: Self-pay | Admitting: Family Medicine

## 2018-02-08 DIAGNOSIS — E039 Hypothyroidism, unspecified: Secondary | ICD-10-CM

## 2018-02-21 ENCOUNTER — Ambulatory Visit (INDEPENDENT_AMBULATORY_CARE_PROVIDER_SITE_OTHER): Payer: Medicare Other | Admitting: Family Medicine

## 2018-02-21 ENCOUNTER — Encounter: Payer: Self-pay | Admitting: Family Medicine

## 2018-02-21 VITALS — BP 170/80 | HR 60 | Resp 16 | Ht 61.0 in | Wt 125.7 lb

## 2018-02-21 DIAGNOSIS — I10 Essential (primary) hypertension: Secondary | ICD-10-CM

## 2018-02-21 DIAGNOSIS — J3089 Other allergic rhinitis: Secondary | ICD-10-CM | POA: Diagnosis not present

## 2018-02-21 DIAGNOSIS — N184 Chronic kidney disease, stage 4 (severe): Secondary | ICD-10-CM

## 2018-02-21 DIAGNOSIS — E039 Hypothyroidism, unspecified: Secondary | ICD-10-CM | POA: Diagnosis not present

## 2018-02-21 DIAGNOSIS — G8929 Other chronic pain: Secondary | ICD-10-CM | POA: Diagnosis not present

## 2018-02-21 DIAGNOSIS — K219 Gastro-esophageal reflux disease without esophagitis: Secondary | ICD-10-CM | POA: Diagnosis not present

## 2018-02-21 DIAGNOSIS — N2581 Secondary hyperparathyroidism of renal origin: Secondary | ICD-10-CM | POA: Diagnosis not present

## 2018-02-21 DIAGNOSIS — R252 Cramp and spasm: Secondary | ICD-10-CM

## 2018-02-21 DIAGNOSIS — D696 Thrombocytopenia, unspecified: Secondary | ICD-10-CM

## 2018-02-21 DIAGNOSIS — M545 Low back pain: Secondary | ICD-10-CM | POA: Diagnosis not present

## 2018-02-21 DIAGNOSIS — K5909 Other constipation: Secondary | ICD-10-CM | POA: Diagnosis not present

## 2018-02-21 DIAGNOSIS — I700 Atherosclerosis of aorta: Secondary | ICD-10-CM

## 2018-02-21 DIAGNOSIS — J302 Other seasonal allergic rhinitis: Secondary | ICD-10-CM

## 2018-02-21 MED ORDER — LORATADINE 10 MG PO TABS: 10.0000 mg | ORAL_TABLET | Freq: Every day | ORAL | 5 refills | Status: DC

## 2018-02-21 MED ORDER — OMEPRAZOLE 40 MG PO CPDR: 40.0000 mg | DELAYED_RELEASE_CAPSULE | Freq: Every evening | ORAL | 5 refills | Status: DC

## 2018-02-21 MED ORDER — ROPINIROLE HCL 0.5 MG PO TABS: 0.5000 mg | ORAL_TABLET | Freq: Every day | ORAL | 1 refills | Status: DC

## 2018-02-21 MED ORDER — VITAMIN D (ERGOCALCIFEROL) 1.25 MG (50000 UNIT) PO CAPS: 50000.0000 [IU] | ORAL_CAPSULE | ORAL | 1 refills | Status: DC

## 2018-02-21 MED ORDER — LUBIPROSTONE 24 MCG PO CAPS: 24.0000 ug | ORAL_CAPSULE | Freq: Two times a day (BID) | ORAL | 5 refills | Status: DC

## 2018-02-21 MED ORDER — GABAPENTIN 300 MG PO CAPS: 300.0000 mg | ORAL_CAPSULE | Freq: Every day | ORAL | 5 refills | Status: DC

## 2018-02-21 MED ORDER — AMLODIPINE BESYLATE 5 MG PO TABS: 5.0000 mg | ORAL_TABLET | Freq: Every day | ORAL | 5 refills | Status: DC

## 2018-02-21 MED ORDER — LEVOTHYROXINE SODIUM 112 MCG PO TABS: 112.0000 ug | ORAL_TABLET | Freq: Every day | ORAL | 0 refills | Status: DC

## 2018-02-21 MED ORDER — LISINOPRIL 20 MG PO TABS: 20.0000 mg | ORAL_TABLET | Freq: Every day | ORAL | 5 refills | Status: DC

## 2018-02-21 NOTE — Progress Notes (Signed)
Name: Lori Pacheco   MRN: [id number]    DOB: Aug 30, 1921   Date:02/21/2018       Progress Note  Subjective  Chief Complaint  Chief Complaint  Patient presents with  . Hypertension    she has not taken her blood pressure medication today.  . Hyperlipidemia  . Hypothyroidism    HPI  She is here today with her daughter [name] )    Pancytopenia: seen by hematologist but after conversation with her and her daughter Oct 2018 they decided to not pursue any further testing or therapy.Fatigue is better  HTN/CKI stage III, secondary hyperparathyroidism seeing Dr. Holley Raring. Daughter states she has been compliant with medications over the past month. She denies chest painor palpitation, bp is high again. Discussed checking bp more often and giving her hydralazine prn. She may also have a component of white coat hypertension. Daughter has not been checking her bp lately   Hypothyroidism: shewas  on Synthroid 125 mcg for the past few months, she ran out of medication three days prior to last and TSH was low, we changed medication to 112 mcg and we will recheck it today.   Chronic low back pain/constipation:she is doing much better with Amitiza and Gabapentin at night, and symptoms semes to have improved. Taking amitiza once or twice daily and needs refill of medication. No blood in stools.   RLS: she is taking requip one pill at night and she is sleeping well.  Dementia: she also has low level of education but failed CIT -6 , daughter states stable, no mood changes   Recurrent Falls: she states she loses her balance, she refuses PT, states she does not want to bothered. She denies syncopal episodes. She gets up on her own.   Atherosclerosis aorta: she does not want to take statin therapy , she is taking aspirin 81 mg daily   Thrombocytopenia: seen by hematologist but does not want further testing  Patient Active Problem List   Diagnosis Date Noted  . Pancytopenia ([place]) 07/24/2017  .  Thrombocytopenia ([place]) 05/15/2017  . Secondary renal hyperparathyroidism ([place]) 05/15/2017  . Atherosclerosis of abdominal aorta ([place]) 11/10/2016  . Other neutropenia ([place]) 11/10/2016  . Perennial allergic rhinitis with seasonal variation 11/10/2016  . Vitamin D deficiency 03/29/2016  . Hypothyroidism 09/18/2015  . Essential hypertension 05/19/2015  . Hyperlipemia 05/19/2015  . Chronic kidney disease, stage IV (severe) ([place]) 05/19/2015    Past Surgical History:  Procedure Laterality Date  . NO PAST SURGERIES      Family History  Problem Relation Age of Onset  . Healthy Mother   . Healthy Father   . Heart disease Brother   . Cancer Brother   . Cancer Daughter   . Alcohol abuse Son   . Diabetes Son   . Heart disease Son     Social History   Socioeconomic History  . Marital status: Widowed    Spouse name: Lori Pacheco  . Number of children: 4  . Years of education: Not on file  . Highest education level: 3rd grade  Occupational History  . Occupation: Retired  Scientific laboratory technician  . Financial resource strain: Not hard at all  . Food insecurity:    Worry: Never true    Inability: Never true  . Transportation needs:    Medical: No    Non-medical: No  Tobacco Use  . Smoking status: Never Smoker  . Smokeless tobacco: Former Systems developer    Types: Snuff  . Tobacco comment: smoking  cessation materials not required  Substance and Sexual Activity  . Alcohol use: No    Alcohol/week: 0.0 oz  . Drug use: No  . Sexual activity: Never  Lifestyle  . Physical activity:    Days per week: 7 days    Minutes per session: 60 min  . Stress: Not at all  Relationships  . Social connections:    Talks on phone: Patient refused    Gets together: Patient refused    Attends religious service: Patient refused    Active member of club or organization: Patient refused    Attends meetings of clubs or organizations: Patient refused    Relationship status: Widowed  . Intimate partner violence:    Fear of  current or ex partner: No    Emotionally abused: No    Physically abused: No    Forced sexual activity: No  Other Topics Concern  . Not on file  Social History Narrative         Current Outpatient Medications:  .  acetaminophen (TYLENOL) 500 MG tablet, Take 1 tablet (500 mg total) by mouth every 6 (six) hours as needed., Disp: 100 tablet, Rfl: 0 .  amLODipine (NORVASC) 5 MG tablet, Take 1 tablet (5 mg total) by mouth daily., Disp: 90 tablet, Rfl: 1 .  gabapentin (NEURONTIN) 300 MG capsule, Take 1 capsule (300 mg total) at bedtime by mouth. qhs, Disp: 90 capsule, Rfl: 1 .  hydrALAZINE (APRESOLINE) 10 MG tablet, Take 1 tablet (10 mg total) by mouth every 6 (six) hours as needed. IF BP IS ABOVE 160/90, Disp: 30 tablet, Rfl: 1 .  levothyroxine (SYNTHROID, LEVOTHROID) 112 MCG tablet, Take 1 tablet (112 mcg total) by mouth daily., Disp: 30 tablet, Rfl: 1 .  lisinopril (PRINIVIL,ZESTRIL) 20 MG tablet, Take 1 tablet (20 mg total) by mouth daily., Disp: 90 tablet, Rfl: 1 .  loratadine (CLARITIN) 10 MG tablet, Take 1 tablet (10 mg total) by mouth daily., Disp: 30 tablet, Rfl: 11 .  lubiprostone (AMITIZA) 24 MCG capsule, Take 1 capsule (24 mcg total) by mouth 2 (two) times daily with a meal., Disp: 60 capsule, Rfl: 5 .  Olopatadine HCl 0.2 % SOLN, Apply 1 drop to eye daily., Disp: 2.5 mL, Rfl: 2 .  Vitamin D, Ergocalciferol, (DRISDOL) 50000 units CAPS capsule, Take 1 capsule (50,000 Units total) by mouth once a week., Disp: 12 capsule, Rfl: 1 .  omeprazole (PRILOSEC) 40 MG capsule, Take 1 capsule (40 mg total) by mouth daily. Before supper, Disp: 30 capsule, Rfl: 2 .  rOPINIRole (REQUIP) 0.5 MG tablet, Take 1 tablet (0.5 mg total) by mouth at bedtime., Disp: 90 tablet, Rfl: 1  No Known Allergies   ROS  Constitutional: Negative for fever or significant  weight change.  Respiratory: Negative for cough and shortness of breath.   Cardiovascular: Negative for chest pain or palpitations.   Gastrointestinal: Negative for abdominal pain, no bowel changes.  Musculoskeletal: Negative for gait problem or joint swelling.  Skin: Negative for rash.  Neurological: She has dizziness when she first gets up but no  headache.  No other specific complaints in a complete review of systems (except as listed in HPI above).  Objective  Vitals:   02/21/18 1535  BP: (!) 170/80  Pulse: 60  Resp: 16  SpO2: 96%  Weight: 125 lb 11.2 oz (57 kg)  Height: 5\' 1"  (1.549 m)    Body mass index is 23.75 kg/m.  Physical Exam  Constitutional: Patient appears well-developed and well-nourished.  No distress.  HEENT: head atraumatic, normocephalic, pupils equal and reactive to light, neck supple, throat within normal limits Cardiovascular: Normal rate, regular rhythm and normal heart sounds.  No murmur heard. Trace  BLE edema. Pulmonary/Chest: Effort normal and breath sounds normal. No respiratory distress. Abdominal: Soft.  There is no tenderness. Psychiatric: Patient has a normal mood and affect. behavior is normal. Happy and cooperative  PHQ2/9: Depression screen [facility] 2/9 12/22/2017 12/22/2017 11/21/2017 09/06/2017 06/26/2017  Decreased Interest 0 0 0 0 0  Down, Depressed, Hopeless 0 0 0 0 1  PHQ - 2 Score 0 0 0 0 1  Altered sleeping 0 - - - -  Tired, decreased energy 0 - - - -  Change in appetite 0 - - - -  Feeling bad or failure about yourself  0 - - - -  Trouble concentrating 0 - - - -  Moving slowly or fidgety/restless 0 - - - -  Suicidal thoughts 0 - - - -  PHQ-9 Score 0 - - - -  Difficult doing work/chores Not difficult at all - - - -     Fall Risk: Fall Risk  02/21/2018 02/21/2018 12/22/2017 11/21/2017 09/06/2017  Falls in the past year? Yes Yes Yes Yes No  Comment - - tripped when walking - -  Number falls in past yr: - 2 or more 2 or more 2 or more -  Injury with Fall? - No No No -  Risk Factor Category  - High Fall Risk High Fall Risk - -  Comment - - shuffling gait - -  Risk for fall  due to : - - History of fall(s) - -  Risk for fall due to: Comment - - - - -  Follow up - Falls prevention discussed;Education provided Falls evaluation completed;Education provided;Falls prevention discussed - -     Functional Status Survey: Is the patient deaf or have difficulty hearing?: No Does the patient have difficulty seeing, even when wearing glasses/contacts?: No Does the patient have difficulty concentrating, remembering, or making decisions?: No Does the patient have difficulty walking or climbing stairs?: No Does the patient have difficulty dressing or bathing?: No Does the patient have difficulty doing errands alone such as visiting a doctor's office or shopping?: Yes   Assessment & Plan  1. Essential hypertension  - amLODipine (NORVASC) 5 MG tablet; Take 1 tablet (5 mg total) by mouth daily.  Dispense: 30 tablet; Refill: 5 - lisinopril (PRINIVIL,ZESTRIL) 20 MG tablet; Take 1 tablet (20 mg total) by mouth daily.  Dispense: 30 tablet; Refill: 5 Monitor bp at home and call back if stays high to adjust medication, get up slowly to avoid more falls, since she has home bound, she can qualify to have home PT  2. Chronic bilateral low back pain without sciatica  - gabapentin (NEURONTIN) 300 MG capsule; Take 1 capsule (300 mg total) by mouth at bedtime. qhs  Dispense: 30 capsule; Refill: 5  3. Other constipation  - lubiprostone (AMITIZA) 24 MCG capsule; Take 1 capsule (24 mcg total) by mouth 2 (two) times daily with a meal.  Dispense: 60 capsule; Refill: 5  4. Leg cramps  - rOPINIRole (REQUIP) 0.5 MG tablet; Take 1 tablet (0.5 mg total) by mouth at bedtime.  Dispense: 90 tablet; Refill: 1  5. Gastroesophageal reflux disease without esophagitis  - omeprazole (PRILOSEC) 40 MG capsule; Take 1 capsule (40 mg total) by mouth every evening.  Dispense: 30 capsule; Refill: 5  6. Perennial allergic rhinitis  with seasonal variation  - loratadine (CLARITIN) 10 MG tablet; Take 1  tablet (10 mg total) by mouth daily.  Dispense: 30 tablet; Refill: 5  7. Acquired hypothyroidism  - levothyroxine (SYNTHROID, LEVOTHROID) 112 MCG tablet; Take 1 tablet (112 mcg total) by mouth daily.  Dispense: 30 tablet; Refill: 0 - TSH  8. Secondary renal hyperparathyroidism (HCC)  - Vitamin D, Ergocalciferol, (DRISDOL) 50000 units CAPS capsule; Take 1 capsule (50,000 Units total) by mouth once a week.  Dispense: 12 capsule; Refill: 1  9. Chronic kidney disease, stage IV (severe) (H[facility])  - Urine Microalbumin w/creat. Ratio  10. Atherosclerosis of abdominal aorta (H[facility])  Not taking statin by choice  11. Thrombocytopenia ([place])  Does not want to get checked at this time

## 2018-02-22 ENCOUNTER — Other Ambulatory Visit: Payer: Self-pay | Admitting: Family Medicine

## 2018-02-22 ENCOUNTER — Telehealth: Payer: Self-pay

## 2018-02-22 DIAGNOSIS — E039 Hypothyroidism, unspecified: Secondary | ICD-10-CM

## 2018-02-22 LAB — MICROALBUMIN / CREATININE URINE RATIO
Creatinine, Urine: 125 mg/dL (ref 20–275)
Microalb Creat Ratio: 8 mcg/mg creat (ref <30)
Microalb, Ur: 1 mg/dL

## 2018-02-22 LAB — TSH: TSH: 0.14 m[IU]/L — AB (ref 0.40–4.50)

## 2018-02-22 MED ORDER — LEVOTHYROXINE SODIUM 100 MCG PO TABS: 100.0000 ug | ORAL_TABLET | Freq: Every day | ORAL | 1 refills | Status: DC

## 2018-02-22 NOTE — Telephone Encounter (Signed)
Patient called.  Unable to reach patient. If patient calls back please inform her of her most recent lab results.

## 2018-02-22 NOTE — Telephone Encounter (Signed)
-----   Message from Steele Sizer, MD sent at 02/22/2018  4:24 PM EDT ----- She is still hyperthyroid, I am sending new rx of levothyroxine at lower dose at 100 mcg daily

## 2018-02-22 NOTE — Telephone Encounter (Signed)
Pt's daughter, [name] given lab results per notes of Dr. Ancil Boozer on 02/22/18. Pt verbalized understanding.

## 2018-03-07 ENCOUNTER — Telehealth: Payer: Self-pay | Admitting: Family Medicine

## 2018-03-07 NOTE — Telephone Encounter (Signed)
Her daughter [name] called and was given the lab results that Dr. Ancil Boozer ordered on 02/21/18.  She is still hyperthyroid.   I am sending new Rx of levothyroxine at lower dose at 100 mcg daily.     [name] said she went and picked up this dose. (100 mcg)   I let her know her mother needed to have her TSH rechecked in 6 weeks.   Before I could schedule the TSH we got disconnected.   When I called her back to schedule this I got her voicemail.    I left a message letting her know that she did not need to talk with a nurse again but she just needed an appt for the TSH recheck in 6 weeks.

## 2018-04-23 ENCOUNTER — Ambulatory Visit: Payer: Medicare Other | Admitting: Family Medicine

## 2018-04-23 ENCOUNTER — Encounter: Payer: Self-pay | Admitting: Family Medicine

## 2018-04-23 VITALS — BP 130/80 | HR 91 | Temp 98.2°F | Resp 16 | Ht 61.0 in | Wt 124.1 lb

## 2018-04-23 DIAGNOSIS — R252 Cramp and spasm: Secondary | ICD-10-CM | POA: Insufficient documentation

## 2018-04-23 DIAGNOSIS — K5909 Other constipation: Secondary | ICD-10-CM | POA: Diagnosis not present

## 2018-04-23 DIAGNOSIS — S0990XA Unspecified injury of head, initial encounter: Secondary | ICD-10-CM | POA: Diagnosis not present

## 2018-04-23 DIAGNOSIS — N2581 Secondary hyperparathyroidism of renal origin: Secondary | ICD-10-CM | POA: Diagnosis not present

## 2018-04-23 DIAGNOSIS — R1013 Epigastric pain: Secondary | ICD-10-CM

## 2018-04-23 MED ORDER — ROPINIROLE HCL 0.5 MG PO TABS: 1.0000 mg | ORAL_TABLET | Freq: Every day | ORAL | 0 refills | Status: DC

## 2018-04-23 MED ORDER — VITAMIN D (ERGOCALCIFEROL) 1.25 MG (50000 UNIT) PO CAPS: 50000.0000 [IU] | ORAL_CAPSULE | ORAL | 0 refills | Status: DC

## 2018-04-23 NOTE — Progress Notes (Signed)
aName: Lori Pacheco   MRN: [id number]    DOB: [birth year]   Date:04/23/2018       Progress Note  Subjective  Chief Complaint  Chief Complaint  Patient presents with  . Abdominal Pain    feels like a knot is in her abdomen  . Leg Pain    burning, mainly at night  . Head Injury    hit head a few months ago chopping wood    HPI  Lori Pacheco presents with her daughter with the following concerns:  Bilateral Leg Pain: Taking gabapentin 300mg  daily at bedtime. This seems to help her pain, but she is still having pain that wakes her at night.  Taking .5mg  Requip at night as well.  Does not feel she needs PT at this time.  LEFT Head pain: She was chopping wood this past winter and a piece of wood hit her in the left side of her forehead.  Since then she has intermittent head pain in that area - some days it doesn't bother her at all, other times the pain is there for a few days in a row.  Denies vision changes, HTN is under good control, no neck pain.  She does have some watery eyes due to allergies.  She takes Tylenol when she gets this pain and it does seem to help.   Abdominal Pain: When she eats something it feels like she can feel a hard movement in the center upper abdomen/epigatric area.  Takes Amitiza PRN only - her constipation has been doing well overall.  Denies heartburn/reflux, vomiting, diarrhea.    Patient Active Problem List   Diagnosis Date Noted  . Pancytopenia ([place]) 07/24/2017  . Thrombocytopenia ([place]) 05/15/2017  . Secondary renal hyperparathyroidism ([place]) 05/15/2017  . Atherosclerosis of abdominal aorta ([place]) 11/10/2016  . Other neutropenia ([place]) 11/10/2016  . Perennial allergic rhinitis with seasonal variation 11/10/2016  . Vitamin D deficiency 03/29/2016  . Hypothyroidism 09/18/2015  . Essential hypertension 05/19/2015  . Hyperlipemia 05/19/2015  . Chronic kidney disease, stage IV (severe) ([place]) 05/19/2015    Social History   Tobacco Use  . Smoking status:  Never Smoker  . Smokeless tobacco: Former Systems developer    Types: Snuff  . Tobacco comment: smoking cessation materials not required  Substance Use Topics  . Alcohol use: No    Alcohol/week: 0.0 oz     Current Outpatient Medications:  .  acetaminophen (TYLENOL) 500 MG tablet, Take 1 tablet (500 mg total) by mouth every 6 (six) hours as needed., Disp: 100 tablet, Rfl: 0 .  amLODipine (NORVASC) 5 MG tablet, Take 1 tablet (5 mg total) by mouth daily., Disp: 30 tablet, Rfl: 5 .  gabapentin (NEURONTIN) 300 MG capsule, Take 1 capsule (300 mg total) by mouth at bedtime. qhs, Disp: 30 capsule, Rfl: 5 .  hydrALAZINE (APRESOLINE) 10 MG tablet, Take 1 tablet (10 mg total) by mouth every 6 (six) hours as needed. IF BP IS ABOVE 160/90, Disp: 30 tablet, Rfl: 1 .  levothyroxine (SYNTHROID, LEVOTHROID) 100 MCG tablet, Take 1 tablet (100 mcg total) by mouth daily. New dose, stop 112, Disp: 30 tablet, Rfl: 1 .  lisinopril (PRINIVIL,ZESTRIL) 20 MG tablet, Take 1 tablet (20 mg total) by mouth daily., Disp: 30 tablet, Rfl: 5 .  loratadine (CLARITIN) 10 MG tablet, Take 1 tablet (10 mg total) by mouth daily., Disp: 30 tablet, Rfl: 5 .  lubiprostone (AMITIZA) 24 MCG capsule, Take 1 capsule (24 mcg total) by mouth 2 (two) times  daily with a meal., Disp: 60 capsule, Rfl: 5 .  Olopatadine HCl 0.2 % SOLN, Apply 1 drop to eye daily., Disp: 2.5 mL, Rfl: 2 .  omeprazole (PRILOSEC) 40 MG capsule, Take 1 capsule (40 mg total) by mouth every evening., Disp: 30 capsule, Rfl: 5 .  rOPINIRole (REQUIP) 0.5 MG tablet, Take 1 tablet (0.5 mg total) by mouth at bedtime., Disp: 90 tablet, Rfl: 1 .  Vitamin D, Ergocalciferol, (DRISDOL) 50000 units CAPS capsule, Take 1 capsule (50,000 Units total) by mouth once a week., Disp: 12 capsule, Rfl: 1  No Known Allergies  ROS  Constitutional: Negative for fever or weight change.  Respiratory: Negative for cough and shortness of breath.   Cardiovascular: Negative for chest pain or palpitations.   Gastrointestinal: See HPI Musculoskeletal: Negative for gait problem or joint swelling. Positive for leg cramps Skin: Negative for rash.  Neurological: Negative for dizziness; positive for headaches (see HPI).  No other specific complaints in a complete review of systems (except as listed in HPI above).  Objective  Vitals:   04/23/18 0956  BP: 130/80  Pulse: 91  Resp: 16  Temp: 98.2 F (36.8 C)  TempSrc: Oral  SpO2: 97%  Weight: 124 lb 1.6 oz (56.3 kg)  Height: 5\' 1"  (1.549 m)   Body mass index is 23.45 kg/m.  Nursing Note and Vital Signs reviewed.  Physical Exam  Constitutional: Patient appears well-developed and well-nourished.  No distress.  HEENT: head atraumatic, normocephalic, pupils equal and reactive to light, EOM's intact, TM's without erythema or bulging, no maxillary or frontal sinus tenderness, neck supple without lymphadenopathy, oropharynx pink and moist without exudate Cardiovascular: Normal rate, regular rhythm, S1/S2 present.  No murmur or rub heard. No BLE edema. Pulmonary/Chest: Effort normal and breath sounds clear. No respiratory distress or retractions. Abdominal: Soft and non-tender, no palpable mass, bowel sounds present x4 quadrants. Psychiatric: Patient has a normal mood and affect. behavior is normal. Judgment and thought content normal. Musculoskeletal: Normal range of motion, no joint effusions. No gross deformities, no point tenderness to BLE Neurological: she is alert and oriented to person, place, and time. No gross cranial nerve deficit. Coordination, balance, strength, speech and gait are baseline.  Skin: Skin is warm and dry. No rash noted. No erythema.    No results found for this or any previous visit (from the past 72 hour(s)).  Assessment & Plan  1. Epigastric pain - Advised that because pt is asymptomatic today in office, no abdominal tenderness, and vitals are stable, no additional testing is warranted.  However, I am happy to  refer to GI for evaluation/possible EGD. Lori Pacheco agrees and this order is placed. - Ambulatory referral to Gastroenterology  2. Leg cramps - Increase requip to 1mg  (2 tablets daily); if this does not improve in 2 weeks, daughter will call us and we will consider increasing gabapentin dosing at that time. - rOPINIRole (REQUIP) 0.5 MG tablet; Take 2 tablets (1 mg total) by mouth at bedtime.  Dispense: 180 tablet; Refill: 0  3. Injury of head, initial encounter - No neurologic deficit, injury happened several months ago, and tylenol works for her headache pain.  No additional intervention at this time. Discussed worrisome signs and symptoms and when to present for emergency care.  4. Other constipation - Continue Amitiza. - Ambulatory referral to Gastroenterology  5. Secondary renal hyperparathyroidism ([place]) - Pt's daughter requested refill of Vitamin D supplement today.  This is provided. - Vitamin D, Ergocalciferol, (DRISDOL) 50000 units  CAPS capsule; Take 1 capsule (50,000 Units total) by mouth once a week.  Dispense: 12 capsule; Refill: 0  Face-to-face time with patient was more than 25 minutes, >50% time spent counseling and coordination of care

## 2018-05-22 ENCOUNTER — Encounter: Payer: Self-pay | Admitting: Family Medicine

## 2018-05-22 ENCOUNTER — Ambulatory Visit: Payer: Medicare Other | Admitting: Family Medicine

## 2018-05-22 VITALS — BP 146/78 | HR 84 | Temp 98.1°F | Resp 16 | Ht 61.0 in | Wt 121.7 lb

## 2018-05-22 DIAGNOSIS — D61818 Other pancytopenia: Secondary | ICD-10-CM

## 2018-05-22 DIAGNOSIS — E782 Mixed hyperlipidemia: Secondary | ICD-10-CM

## 2018-05-22 DIAGNOSIS — E039 Hypothyroidism, unspecified: Secondary | ICD-10-CM

## 2018-05-22 DIAGNOSIS — E559 Vitamin D deficiency, unspecified: Secondary | ICD-10-CM

## 2018-05-22 DIAGNOSIS — I10 Essential (primary) hypertension: Secondary | ICD-10-CM | POA: Diagnosis not present

## 2018-05-22 DIAGNOSIS — N2581 Secondary hyperparathyroidism of renal origin: Secondary | ICD-10-CM | POA: Diagnosis not present

## 2018-05-22 DIAGNOSIS — G8929 Other chronic pain: Secondary | ICD-10-CM

## 2018-05-22 DIAGNOSIS — K219 Gastro-esophageal reflux disease without esophagitis: Secondary | ICD-10-CM

## 2018-05-22 DIAGNOSIS — R252 Cramp and spasm: Secondary | ICD-10-CM | POA: Diagnosis not present

## 2018-05-22 DIAGNOSIS — K5909 Other constipation: Secondary | ICD-10-CM

## 2018-05-22 DIAGNOSIS — M545 Low back pain: Secondary | ICD-10-CM

## 2018-05-22 DIAGNOSIS — F320 Major depressive disorder, single episode, mild: Secondary | ICD-10-CM

## 2018-05-22 MED ORDER — ROPINIROLE HCL 1 MG PO TABS: 1.0000 mg | ORAL_TABLET | Freq: Every day | ORAL | 5 refills | Status: DC

## 2018-05-22 MED ORDER — SERTRALINE HCL 50 MG PO TABS
50.0000 mg | ORAL_TABLET | Freq: Every day | ORAL | 1 refills | Status: DC
Start: 2018-05-22 — End: 2018-09-03

## 2018-05-22 NOTE — Patient Instructions (Signed)
Check with pharmacy about bubble wraps

## 2018-05-22 NOTE — Progress Notes (Signed)
Name: Lori Pacheco   MRN: [id number]    DOB: 06-10-1921   Date:05/22/2018       Progress Note  Subjective  Chief Complaint  Chief Complaint  Patient presents with  . Medication Refill    Gabapentin made her feel like she was losing her mind-Daughter stopped after a couple of days  . Hypertension    Feet Edema   . Knee Pain    Alot of pain in her knee radiating down her feet  . Hypothyroidism  . Dementia    HPI  She is here today with her daughter [name] )    Pancytopenia: seen by hematologist but after conversation with her and her daughter Oct 2018 they decided to not pursue any further testing or therapy.Fatigue waxes and wanes. Daughter would like to have labs repeated today  HTN/CKI stage I, secondary hyperparathyroidism seeing Dr. Holley Raring. Daughter states she has been compliant with medications now.  She denies chest painor palpitation, bp is at goal today. BP has not been checked at home, but it is at goal today  Hypothyroidism: shewas  on Synthroid 166mcg for the past few months, she ran out of medication three days prior to last and TSH was low, we changed medication to 112 mcg and TSH was still suppressed, she is down to 100 mcg now and we will recheck level.   Chronic low back pain/constipation:she is doing much better with Amitiza , she stopped Gabapentin because patient states it was making her feel strange.  Taking amitiza twice daily , daughter states she still has to take milk of magnesia twice a week.   RLS: she is taking requip one pill at night, patient states she has not been sleeping well, but daughter states she does not leave her bed. Daughter checks on her at night and she is usually sleeping.   Dementia: she also has low level of education but failed CIT -6 , daughter states stable, no mood changes and they don't want medication at this time  Recurrent Falls: she states she loses her balance, she refuses PT, states she does not want to bothered.  She denies syncopal episodes. She gets up on her own.  No recent falls  Atherosclerosis aorta: she does not want to take statin therapy , she is taking aspirin 81 mg daily . Unchanged   Thrombocytopenia: seen by hematologist but does not want further testing  Knee pain: seems like it is more leg pain instead of knee pain, reassurance for now   Patient Active Problem List   Diagnosis Date Noted  . Other constipation 04/23/2018  . Leg cramps 04/23/2018  . Pancytopenia ([place]) 07/24/2017  . Thrombocytopenia ([place]) 05/15/2017  . Secondary renal hyperparathyroidism ([place]) 05/15/2017  . Atherosclerosis of abdominal aorta ([place]) 11/10/2016  . Other neutropenia ([place]) 11/10/2016  . Perennial allergic rhinitis with seasonal variation 11/10/2016  . Vitamin D deficiency 03/29/2016  . Hypothyroidism 09/18/2015  . Essential hypertension 05/19/2015  . Hyperlipemia 05/19/2015  . Chronic kidney disease, stage IV (severe) ([place]) 05/19/2015    Past Surgical History:  Procedure Laterality Date  . NO PAST SURGERIES      Family History  Problem Relation Age of Onset  . Healthy Mother   . Healthy Father   . Heart disease Brother   . Cancer Brother   . Cancer Daughter   . Alcohol abuse Son   . Diabetes Son   . Heart disease Son     Social History   Socioeconomic  History  . Marital status: Widowed    Spouse name: Lori Pacheco  . Number of children: 49  . Years of education: Not on file  . Highest education level: 3rd grade  Occupational History  . Occupation: Retired  Scientific laboratory technician  . Financial resource strain: Not hard at all  . Food insecurity:    Worry: Never true    Inability: Never true  . Transportation needs:    Medical: No    Non-medical: No  Tobacco Use  . Smoking status: Never Smoker  . Smokeless tobacco: Former Systems developer    Types: Snuff  . Tobacco comment: smoking cessation materials not required  Substance and Sexual Activity  . Alcohol use: No    Alcohol/week: 0.0 oz  . Drug  use: No  . Sexual activity: Never  Lifestyle  . Physical activity:    Days per week: 7 days    Minutes per session: 60 min  . Stress: Not at all  Relationships  . Social connections:    Talks on phone: Patient refused    Gets together: Patient refused    Attends religious service: Patient refused    Active member of club or organization: Patient refused    Attends meetings of clubs or organizations: Patient refused    Relationship status: Widowed  . Intimate partner violence:    Fear of current or ex partner: No    Emotionally abused: No    Physically abused: No    Forced sexual activity: No  Other Topics Concern  . Not on file  Social History Narrative         Current Outpatient Medications:  .  acetaminophen (TYLENOL) 500 MG tablet, Take 1 tablet (500 mg total) by mouth every 6 (six) hours as needed., Disp: 100 tablet, Rfl: 0 .  amLODipine (NORVASC) 5 MG tablet, Take 1 tablet (5 mg total) by mouth daily., Disp: 30 tablet, Rfl: 5 .  hydrALAZINE (APRESOLINE) 10 MG tablet, Take 1 tablet (10 mg total) by mouth every 6 (six) hours as needed. IF BP IS ABOVE 160/90, Disp: 30 tablet, Rfl: 1 .  levothyroxine (SYNTHROID, LEVOTHROID) 100 MCG tablet, Take 1 tablet (100 mcg total) by mouth daily. New dose, stop 112, Disp: 30 tablet, Rfl: 1 .  lisinopril (PRINIVIL,ZESTRIL) 20 MG tablet, Take 1 tablet (20 mg total) by mouth daily., Disp: 30 tablet, Rfl: 5 .  loratadine (CLARITIN) 10 MG tablet, Take 1 tablet (10 mg total) by mouth daily., Disp: 30 tablet, Rfl: 5 .  lubiprostone (AMITIZA) 24 MCG capsule, Take 1 capsule (24 mcg total) by mouth 2 (two) times daily with a meal., Disp: 60 capsule, Rfl: 5 .  rOPINIRole (REQUIP) 0.5 MG tablet, Take 2 tablets (1 mg total) by mouth at bedtime., Disp: 180 tablet, Rfl: 0 .  Vitamin D, Ergocalciferol, (DRISDOL) 50000 units CAPS capsule, Take 1 capsule (50,000 Units total) by mouth once a week., Disp: 12 capsule, Rfl: 0 .  gabapentin (NEURONTIN) 300 MG  capsule, Take 1 capsule (300 mg total) by mouth at bedtime. qhs (Patient not taking: Reported on 05/22/2018), Disp: 30 capsule, Rfl: 5 .  Olopatadine HCl 0.2 % SOLN, Apply 1 drop to eye daily. (Patient not taking: Reported on 05/22/2018), Disp: 2.5 mL, Rfl: 2 .  omeprazole (PRILOSEC) 40 MG capsule, Take 1 capsule (40 mg total) by mouth every evening. (Patient not taking: Reported on 05/22/2018), Disp: 30 capsule, Rfl: 5  No Known Allergies   ROS  Constitutional: Negative for fever or significant weight  change.  Respiratory: Negative for cough and shortness of breath.   Cardiovascular: Negative for chest pain or palpitations.  Gastrointestinal: Positive  for abdominal pain, but no  bowel changes.  Musculoskeletal: Positive  for gait problem but no  joint swelling.  Skin: Negative for rash.  Neurological: Negative for dizziness or headache.  No other specific complaints in a complete review of systems (except as listed in HPI above).  Objective  Vitals:   05/22/18 0840  BP: (!) 146/78  Pulse: 84  Resp: 16  Temp: 98.1 F (36.7 C)  TempSrc: Oral  SpO2: 98%  Weight: 121 lb 11.2 oz (55.2 kg)  Height: 5\' 1"  (1.549 m)    Body mass index is 23 kg/m.  Physical Exam  Constitutional: Patient appears well-developed and frail.  No distress.  HEENT: head atraumatic, normocephalic, pupils equal and reactive to light, neck supple, throat within normal limits Cardiovascular: Normal rate, regular rhythm and normal heart sounds.  No murmur heard. No BLE edema. Pulmonary/Chest: Effort normal and breath sounds normal. No respiratory distress. Abdominal: Soft.  There is no tenderness. Psychiatric: Patient has a depressed mood. Good eye contact.   Recent Results (from the past 2160 hour(s))  TSH     Status: Abnormal   Collection Time: 02/21/18  4:33 PM  Result Value Ref Range   TSH 0.14 (L) 0.40 - 4.50 mIU/L  Urine Microalbumin w/creat. ratio     Status: None   Collection Time: 02/21/18  4:33 PM   Result Value Ref Range   Creatinine, Urine 125 20 - 275 mg/dL   Microalb, Ur 1.0 mg/dL    Comment: Reference Range Not established    Microalb Creat Ratio 8 <30 mcg/mg creat    Comment: . The ADA defines abnormalities in albumin excretion as follows: [place] Kitchen Category         Result (mcg/mg creatinine) . Normal                    <30 Microalbuminuria         30-299  Clinical albuminuria   > OR = 300 . The ADA recommends that at least two of three specimens collected within a 3-6 month period be abnormal before considering a patient to be within a diagnostic category.       PHQ2/9: Depression screen [facility] 2/9 05/22/2018 12/22/2017 12/22/2017 11/21/2017 09/06/2017  Decreased Interest 0 0 0 0 0  Down, Depressed, Hopeless 0 0 0 0 0  PHQ - 2 Score 0 0 0 0 0  Altered sleeping - 0 - - -  Tired, decreased energy - 0 - - -  Change in appetite - 0 - - -  Feeling bad or failure about yourself  - 0 - - -  Trouble concentrating - 0 - - -  Moving slowly or fidgety/restless - 0 - - -  Suicidal thoughts - 0 - - -  PHQ-9 Score - 0 - - -  Difficult doing work/chores - Not difficult at all - - -     Fall Risk: Fall Risk  05/22/2018 02/21/2018 02/21/2018 12/22/2017 11/21/2017  Falls in the past year? No Yes Yes Yes Yes  Comment - - - tripped when walking -  Number falls in past yr: - - 2 or more 2 or more 2 or more  Injury with Fall? - - No No No  Risk Factor Category  - - High Fall Risk High Fall Risk -  Comment - - - shuffling gait -  Risk for fall due to : Impaired balance/gait - - History of fall(s) -  Risk for fall due to: Comment - - - - -  Follow up - - Falls prevention discussed;Education provided Falls evaluation completed;Education provided;Falls prevention discussed -     Functional Status Survey: Is the patient deaf or have difficulty hearing?: No Does the patient have difficulty seeing, even when wearing glasses/contacts?: No Does the patient have difficulty concentrating, remembering, or  making decisions?: Yes Does the patient have difficulty walking or climbing stairs?: Yes Does the patient have difficulty dressing or bathing?: No Does the patient have difficulty doing errands alone such as visiting a doctor's office or shopping?: Yes    Assessment & Plan   1. Pancytopenia ([place])  - CBC with Differential/Platelet  2. Secondary renal hyperparathyroidism ([facility])  - Parathyroid hormone, intact (no Ca)  3. Leg cramps  - rOPINIRole (REQUIP) 1 MG tablet; Take 1 tablet (1 mg total) by mouth at bedtime.  Dispense: 30 tablet; Refill: 5  4. Essential hypertension  - COMPLETE METABOLIC PANEL WITH GFR  5. Acquired hypothyroidism  - TSH  6. Chronic bilateral low back pain without sciatica  She has been off gabapentin and legs are sore again  7. Gastroesophageal reflux disease without esophagitis  She has follow up with GI , stopped omeprazole on her own   8. Vitamin D deficiency  - VITAMIN D 25 Hydroxy (Vit-D Deficiency, Fractures)  9. Mixed hyperlipidemia   10. Mild major depression ([place])  She is willing to try medication  - sertraline (ZOLOFT) 50 MG tablet; Take 1 tablet (50 mg total) by mouth daily.  Dispense: 30 tablet; Refill: 1  11. Chronic constipation  Follow up with GI

## 2018-05-23 LAB — CBC WITH DIFFERENTIAL/PLATELET
BASOS ABS: 20 {cells}/uL (ref 0–200)
Basophils Relative: 0.8 %
Eosinophils Absolute: 30 cells/uL (ref 15–500)
Eosinophils Relative: 1.2 %
HEMATOCRIT: 35 % (ref 35.0–45.0)
HEMOGLOBIN: 11.6 g/dL — AB (ref 11.7–15.5)
LYMPHS ABS: 955 {cells}/uL (ref 850–3900)
MCH: 29.4 pg (ref 27.0–33.0)
MCHC: 33.1 g/dL (ref 32.0–36.0)
MCV: 88.6 fL (ref 80.0–100.0)
MPV: 10.7 fL (ref 7.5–12.5)
Monocytes Relative: 8.1 %
NEUTROS ABS: 1293 {cells}/uL — AB (ref 1500–7800)
NEUTROS PCT: 51.7 %
Platelets: 157 10*3/uL (ref 140–400)
RBC: 3.95 10*6/uL (ref 3.80–5.10)
RDW: 13.4 % (ref 11.0–15.0)
Total Lymphocyte: 38.2 %
WBC: 2.5 10*3/uL — AB (ref 3.8–10.8)
WBCMIX: 203 {cells}/uL (ref 200–950)

## 2018-05-23 LAB — COMPLETE METABOLIC PANEL WITH GFR
AG Ratio: 1.5 (calc) (ref 1.0–2.5)
ALKALINE PHOSPHATASE (APISO): 58 U/L (ref 33–130)
ALT: 8 U/L (ref 6–29)
AST: 16 U/L (ref 10–35)
Albumin: 4.2 g/dL (ref 3.6–5.1)
BUN/Creatinine Ratio: 15 (calc) (ref 6–22)
BUN: 20 mg/dL (ref 7–25)
CO2: 28 mmol/L (ref 20–32)
CREATININE: 1.36 mg/dL — AB (ref 0.60–0.88)
Calcium: 9.5 mg/dL (ref 8.6–10.4)
Chloride: 104 mmol/L (ref 98–110)
GFR, EST AFRICAN AMERICAN: 38 mL/min/{1.73_m2} — AB (ref >=60)
GFR, EST NON AFRICAN AMERICAN: 33 mL/min/{1.73_m2} — AB (ref >=60)
GLOBULIN: 2.8 g/dL (ref 1.9–3.7)
Glucose, Bld: 105 mg/dL — ABNORMAL HIGH (ref 65–99)
Potassium: 4.7 mmol/L (ref 3.5–5.3)
SODIUM: 140 mmol/L (ref 135–146)
Total Bilirubin: 0.6 mg/dL (ref 0.2–1.2)
Total Protein: 7 g/dL (ref 6.1–8.1)

## 2018-05-23 LAB — VITAMIN D 25 HYDROXY (VIT D DEFICIENCY, FRACTURES): VIT D 25 HYDROXY: 48 ng/mL (ref 30–100)

## 2018-05-23 LAB — TSH: TSH: 16.05 mIU/L — ABNORMAL HIGH (ref 0.40–4.50)

## 2018-05-23 LAB — PARATHYROID HORMONE, INTACT (NO CA): PTH: 27 pg/mL (ref 14–64)

## 2018-05-24 ENCOUNTER — Ambulatory Visit: Payer: Medicare Other | Admitting: Family Medicine

## 2018-05-25 ENCOUNTER — Ambulatory Visit: Payer: Medicare Other | Admitting: Family Medicine

## 2018-05-29 ENCOUNTER — Ambulatory Visit (INDEPENDENT_AMBULATORY_CARE_PROVIDER_SITE_OTHER): Payer: Medicare Other | Admitting: Gastroenterology

## 2018-05-29 ENCOUNTER — Encounter: Payer: Self-pay | Admitting: Gastroenterology

## 2018-05-29 VITALS — BP 116/65 | HR 77 | Ht 61.0 in | Wt 122.2 lb

## 2018-05-29 DIAGNOSIS — K5900 Constipation, unspecified: Secondary | ICD-10-CM | POA: Diagnosis not present

## 2018-05-29 NOTE — Progress Notes (Signed)
Jonathon Bellows MD, MRCP(U.K) [address]  [address]  [place], [place] [address]  Main: [phone]  Fax: [phone]   Gastroenterology Consultation  Referring Provider:     Hubbard Hartshorn, FNP Primary Care Physician:  Steele Sizer, MD Primary Gastroenterologist:  Dr. Jonathon Bellows  Reason for Consultation:    Constipation and abdominal pain.         HPI:   Lori Pacheco is a 84 y.o. y/o female referred for consultation & management  by Dr. Ancil Boozer, Drue Stager, MD.    She says she has had constipation for > 20 years , recently was started on Linzess , cant recall the dose which partially helped but has had to take milk of magnesia in addition at times. When she does not have a good bowel movement has abdominal distension, bloating and has abdominal discomfort. Denies any weight loss or rectal bleeding . Never had a colonoscopy.She is here today with a family member.     Past Medical History:  Diagnosis Date  . Hyperlipidemia   . Hypertension   . Thyroid disease     Past Surgical History:  Procedure Laterality Date  . NO PAST SURGERIES      Prior to Admission medications   Medication Sig Start Date End Date Taking? Authorizing Provider  acetaminophen (TYLENOL) 500 MG tablet Take 1 tablet (500 mg total) by mouth every 6 (six) hours as needed. 07/24/17   Steele Sizer, MD  amLODipine (NORVASC) 5 MG tablet Take 1 tablet (5 mg total) by mouth daily. 02/21/18   Steele Sizer, MD  gabapentin (NEURONTIN) 300 MG capsule Take 1 capsule (300 mg total) by mouth at bedtime. qhs Patient not taking: Reported on 05/22/2018 02/21/18   Steele Sizer, MD  hydrALAZINE (APRESOLINE) 10 MG tablet Take 1 tablet (10 mg total) by mouth every 6 (six) hours as needed. IF BP IS ABOVE 160/90 12/22/17   Hubbard Hartshorn, FNP  levothyroxine (SYNTHROID, LEVOTHROID) 100 MCG tablet Take 1 tablet (100 mcg total) by mouth daily. New dose, stop 112 02/22/18   Sowles, Drue Stager, MD  lisinopril (PRINIVIL,ZESTRIL) 20  MG tablet Take 1 tablet (20 mg total) by mouth daily. 02/21/18   Steele Sizer, MD  loratadine (CLARITIN) 10 MG tablet Take 1 tablet (10 mg total) by mouth daily. 02/21/18   Steele Sizer, MD  lubiprostone (AMITIZA) 24 MCG capsule Take 1 capsule (24 mcg total) by mouth 2 (two) times daily with a meal. 02/21/18   Sowles, Drue Stager, MD  Olopatadine HCl 0.2 % SOLN Apply 1 drop to eye daily. Patient not taking: Reported on 05/22/2018 11/10/16   Steele Sizer, MD  omeprazole (PRILOSEC) 40 MG capsule Take 1 capsule (40 mg total) by mouth every evening. Patient not taking: Reported on 05/22/2018 02/21/18   Steele Sizer, MD  rOPINIRole (REQUIP) 1 MG tablet Take 1 tablet (1 mg total) by mouth at bedtime. 05/22/18   Steele Sizer, MD  sertraline (ZOLOFT) 50 MG tablet Take 1 tablet (50 mg total) by mouth daily. 05/22/18   Steele Sizer, MD  Vitamin D, Ergocalciferol, (DRISDOL) 50000 units CAPS capsule Take 1 capsule (50,000 Units total) by mouth once a week. 04/23/18   Hubbard Hartshorn, FNP    Family History  Problem Relation Age of Onset  . Healthy Mother   . Healthy Father   . Heart disease Brother   . Cancer Brother   . Cancer Daughter   . Alcohol abuse Son   . Diabetes Son   .  Heart disease Son      Social History   Tobacco Use  . Smoking status: Never Smoker  . Smokeless tobacco: Former Systems developer    Types: Snuff  . Tobacco comment: smoking cessation materials not required  Substance Use Topics  . Alcohol use: No    Alcohol/week: 0.0 standard drinks  . Drug use: No    Allergies as of 05/29/2018  . (No Known Allergies)    Review of Systems:    All systems reviewed and negative except where noted in HPI.   Physical Exam:  There were no vitals taken for this visit. No LMP recorded. Patient is postmenopausal. Psych:  Alert and cooperative. Normal mood and affect. General:   Alert,  Well-developed, well-nourished, pleasant and cooperative in NAD Head:  Normocephalic and atraumatic. Eyes:   Sclera clear, no icterus.   Conjunctiva pink. Ears:  Normal auditory acuity. Nose:  No deformity, discharge, or lesions. Mouth:  No deformity or lesions,oropharynx pink & moist. Neck:  Supple; no masses or thyromegaly. Lungs:  Respirations even and unlabored.  Clear throughout to auscultation.   No wheezes, crackles, or rhonchi. No acute distress. Heart:  Regular rate and rhythm; no murmurs, clicks, rubs, or gallops. Abdomen:  Normal bowel sounds.  No bruits.  Soft, non-tender and non-distended without masses, hepatosplenomegaly or hernias noted.  No guarding or rebound tenderness.    Neurologic:  Alert and oriented x3;  grossly normal neurologically. Skin:  Intact without significant lesions or rashes. No jaundice. Lymph Nodes:  No significant cervical adenopathy. Psych:  Alert and cooperative. Normal mood and affect.  Imaging Studies: No results found.  Assessment and Plan:   Lori Pacheco is a 84 y.o. y/o female has been referred for constipation and associated abdominal pain.   Long discussion , she expressed she didn't want any form of investigations as she did agree with me that complications were high at her age . We will plan to treat her symptoms. Stop ;linzess and try Trulance. 2 weeks samples provided. IB guard samples also provided. She will call me for a refill if it works otherwise she will call to schedule a follow up.   Follow up PRN  Dr Jonathon Bellows MD,MRCP(U.K)

## 2018-06-20 ENCOUNTER — Telehealth: Payer: Self-pay | Admitting: Gastroenterology

## 2018-06-20 NOTE — Telephone Encounter (Signed)
Pts daughter [name] calling back stating pt wants a higher dose but does still want it refilled.

## 2018-06-20 NOTE — Telephone Encounter (Signed)
Patient called asking about what medications Dr.Anna put her on. Left message with pt daughter [name] about both medications and she states she will have to patient cb. FYI

## 2018-06-25 ENCOUNTER — Telehealth: Payer: Self-pay | Admitting: Gastroenterology

## 2018-06-25 NOTE — Telephone Encounter (Signed)
Pt daughter is calling to let Dr. Vicente Males know the rx prescripted for pt is not working well to switch to a different kind please call her

## 2018-07-10 ENCOUNTER — Other Ambulatory Visit: Payer: Self-pay | Admitting: Family Medicine

## 2018-07-10 DIAGNOSIS — E039 Hypothyroidism, unspecified: Secondary | ICD-10-CM

## 2018-07-10 NOTE — Telephone Encounter (Signed)
Pt calling to check on this since the pharmacy told her she had no refills left.  Pt is out of medication.

## 2018-07-10 NOTE — Telephone Encounter (Signed)
She is due for repeat TSH, her last level was not at goal, can she stop by tomorrow?

## 2018-07-10 NOTE — Telephone Encounter (Signed)
Left a message for patient to stop by tomorrow if possible for a updated TSH level.

## 2018-07-10 NOTE — Telephone Encounter (Signed)
FYI

## 2018-07-10 NOTE — Telephone Encounter (Signed)
Patient called to mobile number listed and her daughter [name] answered the phone. I spoke to her per DPR. I advised Dr. Ancil Boozer would like for her to come to the office for a TSH level tomorrow. She says that the patient went to [facility] emergency department on Saturday and a lot of blood was taken. I advised that according to the records, a TSH level was not done. She says she will come in tomorrow afternoon to have it drawn.

## 2018-07-11 ENCOUNTER — Other Ambulatory Visit: Payer: Self-pay

## 2018-07-11 DIAGNOSIS — D61818 Other pancytopenia: Secondary | ICD-10-CM

## 2018-07-11 DIAGNOSIS — E039 Hypothyroidism, unspecified: Secondary | ICD-10-CM

## 2018-07-11 LAB — TSH: TSH: 94.97 mIU/L — ABNORMAL HIGH (ref 0.40–4.50)

## 2018-07-12 ENCOUNTER — Ambulatory Visit: Payer: Self-pay | Admitting: *Deleted

## 2018-07-12 DIAGNOSIS — E039 Hypothyroidism, unspecified: Secondary | ICD-10-CM

## 2018-07-12 DIAGNOSIS — E0590 Thyrotoxicosis, unspecified without thyrotoxic crisis or storm: Secondary | ICD-10-CM

## 2018-07-12 MED ORDER — LEVOTHYROXINE SODIUM 100 MCG PO TABS: 100.0000 ug | ORAL_TABLET | Freq: Every day | ORAL | 1 refills | Status: DC

## 2018-07-12 NOTE — Chronic Care Management (AMB) (Signed)
  Chronic Care Management   Note  07/12/2018 Name: Lori Pacheco MRN: [id number] DOB: [birth year]  I reached out to the home of Mrs. Lori Pacheco and spoke with her daughter, Mrs. Lori Pacheco in response to a referral sent by her primary care provider, Dr Steele Sizer to offer chronic care management support. I explained the services and benefits of Chronic Care Management program to Mrs. Lori Pacheco who very politely declined. I advised Mrs. Lori Pacheco that she can call the office at any time should she feel her mother would benefit from further case management assistance.   Plan: The CCM Team is available to provide support and assistance to [name] at any time should she or her daughters be willing to participate.   [facility] [place] [facility]  [phone]

## 2018-07-16 NOTE — Patient Instructions (Signed)
Please call the CCM Team at [facility] should you be in need of case management services.   CCM (Chronic Care Management) Team  [place] Nurse Care Coordinator  [phone]   Ruben Reason PharmD  Clinical Pharmacist  [phone]

## 2018-07-23 ENCOUNTER — Encounter: Payer: Self-pay | Admitting: Family Medicine

## 2018-07-23 ENCOUNTER — Ambulatory Visit: Payer: Medicare Other | Admitting: Family Medicine

## 2018-07-23 VITALS — BP 138/74 | HR 77 | Temp 98.0°F | Resp 14 | Ht 61.0 in | Wt 119.9 lb

## 2018-07-23 DIAGNOSIS — Z23 Encounter for immunization: Secondary | ICD-10-CM | POA: Diagnosis not present

## 2018-07-23 DIAGNOSIS — F320 Major depressive disorder, single episode, mild: Secondary | ICD-10-CM

## 2018-07-23 DIAGNOSIS — I10 Essential (primary) hypertension: Secondary | ICD-10-CM | POA: Diagnosis not present

## 2018-07-23 DIAGNOSIS — G629 Polyneuropathy, unspecified: Secondary | ICD-10-CM

## 2018-07-23 DIAGNOSIS — E039 Hypothyroidism, unspecified: Secondary | ICD-10-CM | POA: Diagnosis not present

## 2018-07-23 MED ORDER — AMLODIPINE BESYLATE 5 MG PO TABS: 5.0000 mg | ORAL_TABLET | Freq: Every day | ORAL | 5 refills | Status: DC

## 2018-07-23 MED ORDER — LISINOPRIL 20 MG PO TABS: 20.0000 mg | ORAL_TABLET | Freq: Every day | ORAL | 5 refills | Status: DC

## 2018-07-23 NOTE — Progress Notes (Signed)
Name: Lori Pacheco   MRN: [id number]    DOB: 1920/11/22   Date:07/23/2018       Progress Note  Subjective  Chief Complaint  Chief Complaint  Patient presents with  . Follow-up    2 month f/u  . Knee Pain    bilateral knee pain. patient feels heat going down her legs. stated that it feel like they are going to give away at times  . Hypertension  . Hypothyroidism    HPI  Hypothyroidism: patient skips medication, daughter that came with her today is [name] and [name] is the sister that stays with her. She has not been compliant with medication, but started taking it daily 3 days ago. We negotiated with patient today and we will keep her on the must take medications on pill box and she will take it daily for 6 weeks.   HTN: also skips medication, bp after rest was at goal, no chest pain or palpitation.   Neuropathy: she states her legs cramps and also burns from knees down, she is on Requip, could not take gabapentin. Pain on her legs is intermittent. Not really knee pain. She walks without any assistance  Depression Mild : daughter states she has not been taking zoloft, she lost weight since last visit, states she feels down, still goes out of the house with daughter when requested but prefers to stay home.    Patient Active Problem List   Diagnosis Date Noted  . Other constipation 04/23/2018  . Leg cramps 04/23/2018  . Pancytopenia ([place]) 07/24/2017  . Thrombocytopenia ([place]) 05/15/2017  . Secondary renal hyperparathyroidism ([place]) 05/15/2017  . Atherosclerosis of abdominal aorta ([place]) 11/10/2016  . Other neutropenia ([place]) 11/10/2016  . Perennial allergic rhinitis with seasonal variation 11/10/2016  . Vitamin D deficiency 03/29/2016  . Hypothyroidism 09/18/2015  . Essential hypertension 05/19/2015  . Hyperlipemia 05/19/2015  . Chronic kidney disease, stage IV (severe) ([place]) 05/19/2015    Past Surgical History:  Procedure Laterality Date  . NO PAST SURGERIES      Family  History  Problem Relation Age of Onset  . Healthy Mother   . Healthy Father   . Heart disease Brother   . Cancer Brother   . Cancer Daughter   . Alcohol abuse Son   . Diabetes Son   . Heart disease Son     Social History   Socioeconomic History  . Marital status: Widowed    Spouse name: Jeneen Rinks  . Number of children: 39  . Years of education: Not on file  . Highest education level: 3rd grade  Occupational History  . Occupation: Retired  Scientific laboratory technician  . Financial resource strain: Not hard at all  . Food insecurity:    Worry: Never true    Inability: Never true  . Transportation needs:    Medical: No    Non-medical: No  Tobacco Use  . Smoking status: Never Smoker  . Smokeless tobacco: Former Systems developer    Types: Snuff  . Tobacco comment: smoking cessation materials not required  Substance and Sexual Activity  . Alcohol use: No    Alcohol/week: 0.0 standard drinks  . Drug use: No  . Sexual activity: Not Currently  Lifestyle  . Physical activity:    Days per week: 7 days    Minutes per session: 60 min  . Stress: Not at all  Relationships  . Social connections:    Talks on phone: Twice a week    Gets together: Twice a  week    Attends religious service: More than 4 times per year    Active member of club or organization: No    Attends meetings of clubs or organizations: Never    Relationship status: Widowed  . Intimate partner violence:    Fear of current or ex partner: No    Emotionally abused: No    Physically abused: No    Forced sexual activity: No  Other Topics Concern  . Not on file  Social History Narrative         Current Outpatient Medications:  .  acetaminophen (TYLENOL) 500 MG tablet, Take 1 tablet (500 mg total) by mouth every 6 (six) hours as needed., Disp: 100 tablet, Rfl: 0 .  amLODipine (NORVASC) 5 MG tablet, Take 1 tablet (5 mg total) by mouth daily., Disp: 30 tablet, Rfl: 5 .  hydrALAZINE (APRESOLINE) 10 MG tablet, Take 1 tablet (10 mg total) by  mouth every 6 (six) hours as needed. IF BP IS ABOVE 160/90, Disp: 30 tablet, Rfl: 1 .  levothyroxine (SYNTHROID, LEVOTHROID) 100 MCG tablet, Take 1 tablet (100 mcg total) by mouth daily. New dose, stop 112, Disp: 30 tablet, Rfl: 1 .  lisinopril (PRINIVIL,ZESTRIL) 20 MG tablet, Take 1 tablet (20 mg total) by mouth daily., Disp: 30 tablet, Rfl: 5 .  loratadine (CLARITIN) 10 MG tablet, Take 1 tablet (10 mg total) by mouth daily., Disp: 30 tablet, Rfl: 5 .  lubiprostone (AMITIZA) 24 MCG capsule, Take 1 capsule (24 mcg total) by mouth 2 (two) times daily with a meal., Disp: 60 capsule, Rfl: 5 .  Olopatadine HCl 0.2 % SOLN, Apply 1 drop to eye daily. (Patient not taking: Reported on 05/22/2018), Disp: 2.5 mL, Rfl: 2 .  omeprazole (PRILOSEC) 40 MG capsule, Take 1 capsule (40 mg total) by mouth every evening., Disp: 30 capsule, Rfl: 5 .  rOPINIRole (REQUIP) 1 MG tablet, Take 1 tablet (1 mg total) by mouth at bedtime., Disp: 30 tablet, Rfl: 5 .  sertraline (ZOLOFT) 50 MG tablet, Take 1 tablet (50 mg total) by mouth daily., Disp: 30 tablet, Rfl: 1 .  Vitamin D, Ergocalciferol, (DRISDOL) 50000 units CAPS capsule, Take 1 capsule (50,000 Units total) by mouth once a week., Disp: 12 capsule, Rfl: 0  No Known Allergies  I personally reviewed active problem list, medication list, allergies, family history with the patient/caregiver today.   ROS  Constitutional: Negative for fever or weight change.  Respiratory: Negative for cough and shortness of breath.   Cardiovascular: Negative for chest pain or palpitations.  Gastrointestinal: Negative for abdominal pain, no bowel changes.  Musculoskeletal: Negative for gait problem or joint swelling.  Skin: Negative for rash.  Neurological: Negative for dizziness or headache.  No other specific complaints in a complete review of systems (except as listed in HPI above).  Objective  Vitals:   07/23/18 1525 07/23/18 1535  BP: 138/74 138/74  Pulse: 77   Resp: 14    Temp: 98 F (36.7 C)   TempSrc: Oral   SpO2: 97%   Weight: 119 lb 14.4 oz (54.4 kg)   Height: 5\' 1"  (1.549 m)     Body mass index is 22.65 kg/m.  Physical Exam  Constitutional: Patient appears well-developed , frail.  No distress.  HEENT: head atraumatic, normocephalic, pupils equal and reactive to light, neck supple, throat within normal limits Cardiovascular: Normal rate, regular rhythm and normal heart sounds.  No murmur heard. No BLE edema. Pulmonary/Chest: Effort normal and breath sounds normal. No  respiratory distress. Abdominal: Soft.  There is no tenderness. Psychiatric: Patient has a normal mood and affect. behavior is normal. Judgment and thought content normal.  Recent Results (from the past 2160 hour(s))  CBC with Differential/Platelet     Status: Abnormal   Collection Time: 05/22/18  9:33 AM  Result Value Ref Range   WBC 2.5 (L) 3.8 - 10.8 Thousand/uL   RBC 3.95 3.80 - 5.10 Million/uL   Hemoglobin 11.6 (L) 11.7 - 15.5 g/dL   HCT 35.0 35.0 - 45.0 %   MCV 88.6 80.0 - 100.0 fL   MCH 29.4 27.0 - 33.0 pg   MCHC 33.1 32.0 - 36.0 g/dL   RDW 13.4 11.0 - 15.0 %   Platelets 157 140 - 400 Thousand/uL   MPV 10.7 7.5 - 12.5 fL   Neutro Abs 1,293 (L) 1,500 - 7,800 cells/uL   Lymphs Abs 955 850 - 3,900 cells/uL   WBC mixed population 203 200 - 950 cells/uL   Eosinophils Absolute 30 15 - 500 cells/uL   Basophils Absolute 20 0 - 200 cells/uL   Neutrophils Relative % 51.7 %   Total Lymphocyte 38.2 %   Monocytes Relative 8.1 %   Eosinophils Relative 1.2 %   Basophils Relative 0.8 %  TSH     Status: Abnormal   Collection Time: 05/22/18  9:33 AM  Result Value Ref Range   TSH 16.05 (H) 0.40 - 4.50 mIU/L  Parathyroid hormone, intact (no Ca)     Status: None   Collection Time: 05/22/18  9:33 AM  Result Value Ref Range   PTH 27 14 - 64 pg/mL    Comment: . Interpretive Guide    Intact PTH           Calcium ------------------    ----------           ------- Normal  Parathyroid    Normal               Normal Hypoparathyroidism    Low or Low Normal    Low Hyperparathyroidism    Primary            Normal or High       High    Secondary          High                 Normal or Low    Tertiary           High                 High Non-Parathyroid    Hypercalcemia      Low or Low Normal    High .   COMPLETE METABOLIC PANEL WITH GFR     Status: Abnormal   Collection Time: 05/22/18  9:33 AM  Result Value Ref Range   Glucose, Bld 105 (H) 65 - 99 mg/dL    Comment: .            Fasting reference interval . For someone without known diabetes, a glucose value between 100 and 125 mg/dL is consistent with prediabetes and should be confirmed with a follow-up test. .    BUN 20 7 - 25 mg/dL   Creat 1.36 (H) 0.60 - 0.88 mg/dL    Comment: For patients >78 years of age, the reference limit for Creatinine is approximately 13% higher for people identified as African-American. .    GFR, Est Non African American 33 (L) > OR =  60 mL/min/1.81m2   GFR, Est African American 38 (L) > OR = 60 mL/min/1.66m2   BUN/Creatinine Ratio 15 6 - 22 (calc)   Sodium 140 135 - 146 mmol/L   Potassium 4.7 3.5 - 5.3 mmol/L   Chloride 104 98 - 110 mmol/L   CO2 28 20 - 32 mmol/L   Calcium 9.5 8.6 - 10.4 mg/dL   Total Protein 7.0 6.1 - 8.1 g/dL   Albumin 4.2 3.6 - 5.1 g/dL   Globulin 2.8 1.9 - 3.7 g/dL (calc)   AG Ratio 1.5 1.0 - 2.5 (calc)   Total Bilirubin 0.6 0.2 - 1.2 mg/dL   Alkaline phosphatase (APISO) 58 33 - 130 U/L   AST 16 10 - 35 U/L   ALT 8 6 - 29 U/L  VITAMIN D 25 Hydroxy (Vit-D Deficiency, Fractures)     Status: None   Collection Time: 05/22/18  9:33 AM  Result Value Ref Range   Vit D, 25-Hydroxy 48 30 - 100 ng/mL    Comment: Vitamin D Status         25-OH Vitamin D: . Deficiency:                    <20 ng/mL Insufficiency:             20 - 29 ng/mL Optimal:                 > or = 30 ng/mL . For 25-OH Vitamin D testing on patients on  D2-supplementation and  patients for whom quantitation  of D2 and D3 fractions is required, the QuestAssureD(TM) 25-OH VIT D, (D2,D3), LC/MS/MS is recommended: order  code [phone] (patients >92yrs). . For more information on this test, go to: http://education.questdiagnostics.com/faq/FAQ163 (This link is being provided for  informational/educational purposes only.)   TSH     Status: Abnormal   Collection Time: 07/11/18  2:55 PM  Result Value Ref Range   TSH 94.97 (H) 0.40 - 4.50 mIU/L     PHQ2/9: Depression screen [facility] 2/9 07/23/2018 05/22/2018 12/22/2017 12/22/2017 11/21/2017  Decreased Interest 0 2 0 0 0  Down, Depressed, Hopeless 2 2 0 0 0  PHQ - 2 Score 2 4 0 0 0  Altered sleeping 2 2 0 - -  Tired, decreased energy 0 2 0 - -  Change in appetite 1 2 0 - -  Feeling bad or failure about yourself  0 2 0 - -  Trouble concentrating 2 2 0 - -  Moving slowly or fidgety/restless 0 0 0 - -  Suicidal thoughts 0 2 0 - -  PHQ-9 Score 7 16 0 - -  Difficult doing work/chores Not difficult at all Not difficult at all Not difficult at all - -    Fall Risk: Fall Risk  07/23/2018 05/22/2018 02/21/2018 02/21/2018 12/22/2017  Falls in the past year? No No Yes Yes Yes  Comment - - - - tripped when walking  Number falls in past yr: - - - 2 or more 2 or more  Injury with Fall? - - - No No  Risk Factor Category  - - - High Fall Risk High Fall Risk  Comment - - - - shuffling gait  Risk for fall due to : - Impaired balance/gait - - History of fall(s)  Risk for fall due to: Comment - - - - -  Follow up - - - Falls prevention discussed;Education provided Falls evaluation completed;Education provided;Falls prevention discussed  Functional Status Survey: Is the patient deaf or have difficulty hearing?: No Does the patient have difficulty seeing, even when wearing glasses/contacts?: No Does the patient have difficulty concentrating, remembering, or making decisions?: No Does the patient have difficulty walking or climbing stairs?:  No Does the patient have difficulty dressing or bathing?: No Does the patient have difficulty doing errands alone such as visiting a doctor's office or shopping?: No   Assessment & Plan   1. Mild major depression ([place])  Needs to start Zoloft and take it daily   2. Need for influenza vaccination  - Flu vaccine HIGH DOSE PF  3. Acquired hypothyroidism  She is taking 100 mcg daily since Friday but skips a lot of doses, explained importance of compliance and follow up in 6 weeks.   4. Essential hypertension  bp is at goal  - amLODipine (NORVASC) 5 MG tablet; Take 1 tablet (5 mg total) by mouth daily.  Dispense: 30 tablet; Refill: 5 - lisinopril (PRINIVIL,ZESTRIL) 20 MG tablet; Take 1 tablet (20 mg total) by mouth daily.  Dispense: 30 tablet; Refill: 5  5. Neuropathy  She continues to have burning sensation on her legs and cramping but cannot tolerate gabapentin

## 2018-09-03 ENCOUNTER — Ambulatory Visit: Payer: Medicare Other | Admitting: Family Medicine

## 2018-09-03 ENCOUNTER — Encounter: Payer: Self-pay | Admitting: Family Medicine

## 2018-09-03 VITALS — BP 168/72 | HR 80 | Temp 98.0°F | Resp 16 | Ht 61.0 in | Wt 126.0 lb

## 2018-09-03 DIAGNOSIS — I129 Hypertensive chronic kidney disease with stage 1 through stage 4 chronic kidney disease, or unspecified chronic kidney disease: Secondary | ICD-10-CM | POA: Diagnosis not present

## 2018-09-03 DIAGNOSIS — E039 Hypothyroidism, unspecified: Secondary | ICD-10-CM

## 2018-09-03 DIAGNOSIS — N183 Chronic kidney disease, stage 3 unspecified: Secondary | ICD-10-CM

## 2018-09-03 NOTE — Progress Notes (Signed)
Name: Lori Pacheco   MRN: [id number]    DOB: 05/21/1921   Date:09/03/2018       Progress Note  Subjective  Chief Complaint  Chief Complaint  Patient presents with  . Hypertension    Dizziness  . Follow-up  . Hypothyroidism    HPI  HTN: she has CKI , she refuses to go to nephrologist, she would not want to do HD. She is complaining of itchy skin at times, normal urine output. Taking bp medication since last visit, we went down to 3 pills that she must take daily ( Two bp medications and levothyroxine). She denies chest pain or palpitation. She states she gets dizzy when she gets up quickly. She does not like when bp is low.   Hypothyroidism: she gained 6 lbs since last visit,has dry skin, she takes milk of magnesia prn Recheck level today   Patient Active Problem List   Diagnosis Date Noted  . Other constipation 04/23/2018  . Leg cramps 04/23/2018  . Pancytopenia ([place]) 07/24/2017  . Thrombocytopenia ([place]) 05/15/2017  . Secondary renal hyperparathyroidism ([place]) 05/15/2017  . Atherosclerosis of abdominal aorta ([place]) 11/10/2016  . Other neutropenia ([place]) 11/10/2016  . Perennial allergic rhinitis with seasonal variation 11/10/2016  . Vitamin D deficiency 03/29/2016  . Hypothyroidism 09/18/2015  . Essential hypertension 05/19/2015  . Hyperlipemia 05/19/2015  . Chronic kidney disease, stage IV (severe) ([place]) 05/19/2015    Past Surgical History:  Procedure Laterality Date  . NO PAST SURGERIES      Family History  Problem Relation Age of Onset  . Healthy Mother   . Healthy Father   . Heart disease Brother   . Cancer Brother   . Cancer Daughter   . Alcohol abuse Son   . Diabetes Son   . Heart disease Son     Social History   Socioeconomic History  . Marital status: Widowed    Spouse name: Jeneen Rinks  . Number of children: 61  . Years of education: Not on file  . Highest education level: 3rd grade  Occupational History  . Occupation: Retired  Scientific laboratory technician  .  Financial resource strain: Not hard at all  . Food insecurity:    Worry: Never true    Inability: Never true  . Transportation needs:    Medical: No    Non-medical: No  Tobacco Use  . Smoking status: Never Smoker  . Smokeless tobacco: Former Systems developer    Types: Snuff  . Tobacco comment: smoking cessation materials not required  Substance and Sexual Activity  . Alcohol use: No    Alcohol/week: 0.0 standard drinks  . Drug use: No  . Sexual activity: Not Currently  Lifestyle  . Physical activity:    Days per week: 7 days    Minutes per session: 60 min  . Stress: Not at all  Relationships  . Social connections:    Talks on phone: Twice a week    Gets together: Twice a week    Attends religious service: More than 4 times per year    Active member of club or organization: No    Attends meetings of clubs or organizations: Never    Relationship status: Widowed  . Intimate partner violence:    Fear of current or ex partner: No    Emotionally abused: No    Physically abused: No    Forced sexual activity: No  Other Topics Concern  . Not on file  Social History Narrative  Current Outpatient Medications:  .  acetaminophen (TYLENOL) 500 MG tablet, Take 1 tablet (500 mg total) by mouth every 6 (six) hours as needed., Disp: 100 tablet, Rfl: 0 .  amLODipine (NORVASC) 5 MG tablet, Take 1 tablet (5 mg total) by mouth daily., Disp: 30 tablet, Rfl: 5 .  levothyroxine (SYNTHROID, LEVOTHROID) 100 MCG tablet, Take 1 tablet (100 mcg total) by mouth daily. New dose, stop 112, Disp: 30 tablet, Rfl: 1 .  lisinopril (PRINIVIL,ZESTRIL) 20 MG tablet, Take 1 tablet (20 mg total) by mouth daily., Disp: 30 tablet, Rfl: 5 .  hydrALAZINE (APRESOLINE) 10 MG tablet, Take 1 tablet (10 mg total) by mouth every 6 (six) hours as needed. IF BP IS ABOVE 160/90 (Patient not taking: Reported on 09/03/2018), Disp: 30 tablet, Rfl: 1 .  Vitamin D, Ergocalciferol, (DRISDOL) 50000 units CAPS capsule, Take 1 capsule  (50,000 Units total) by mouth once a week. (Patient not taking: Reported on 09/03/2018), Disp: 12 capsule, Rfl: 0  No Known Allergies  I personally reviewed active problem list, medication list, allergies, family history, social history with the patient/caregiver today.   ROS  Ten systems reviewed and is negative except as mentioned in HPI   Objective  Vitals:   09/03/18 1431  BP: (!) 164/82  Pulse: 80  Resp: 16  Temp: 98 F (36.7 C)  TempSrc: Oral  SpO2: 98%  Weight: 126 lb (57.2 kg)  Height: 5\' 1"  (1.549 m)    Body mass index is 23.81 kg/m.  Physical Exam  Constitutional: Patient appears frail, and very thin.  No distress.  HEENT: head atraumatic, normocephalic, pupils equal and reactive to light, gums receding neck supple, throat within normal limits Cardiovascular: Normal rate, regular rhythm and normal heart sounds.  No murmur heard. No BLE edema. Pulmonary/Chest: Effort normal and breath sounds normal. No respiratory distress. Abdominal: Soft.  There is no tenderness. Psychiatric: Patient has a normal mood and affect. behavior is normal. Judgment and thought content normal.  Recent Results (from the past 2160 hour(s))  TSH     Status: Abnormal   Collection Time: 07/11/18  2:55 PM  Result Value Ref Range   TSH 94.97 (H) 0.40 - 4.50 mIU/L     PHQ2/9: Depression screen [facility] 2/9 07/23/2018 05/22/2018 12/22/2017 12/22/2017 11/21/2017  Decreased Interest 0 2 0 0 0  Down, Depressed, Hopeless 2 2 0 0 0  PHQ - 2 Score 2 4 0 0 0  Altered sleeping 2 2 0 - -  Tired, decreased energy 0 2 0 - -  Change in appetite 1 2 0 - -  Feeling bad or failure about yourself  0 2 0 - -  Trouble concentrating 2 2 0 - -  Moving slowly or fidgety/restless 0 0 0 - -  Suicidal thoughts 0 2 0 - -  PHQ-9 Score 7 16 0 - -  Difficult doing work/chores Not difficult at all Not difficult at all Not difficult at all - -     Fall Risk: Fall Risk  07/23/2018 05/22/2018 02/21/2018 02/21/2018 12/22/2017   Falls in the past year? No No Yes Yes Yes  Comment - - - - tripped when walking  Number falls in past yr: - - - 2 or more 2 or more  Injury with Fall? - - - No No  Risk Factor Category  - - - High Fall Risk High Fall Risk  Comment - - - - shuffling gait  Risk for fall due to : - Impaired balance/gait - -  History of fall(s)  Risk for fall due to: Comment - - - - -  Follow up - - - Falls prevention discussed;Education provided Falls evaluation completed;Education provided;Falls prevention discussed     Assessment & Plan  1. Acquired hypothyroidism  - TSH  2. Benign hypertension with chronic kidney disease, stage III (HCC)  Continue medications , bp is elevated today also around 150's, and daughter is happy with that, when lower she gets dizzy   3. Chronic kidney disease, stage III (moderate) (HCC)

## 2018-09-04 ENCOUNTER — Other Ambulatory Visit: Payer: Self-pay | Admitting: Family Medicine

## 2018-09-04 DIAGNOSIS — E039 Hypothyroidism, unspecified: Secondary | ICD-10-CM

## 2018-09-04 LAB — TSH: TSH: 9.75 m[IU]/L — AB (ref 0.40–4.50)

## 2018-10-03 ENCOUNTER — Emergency Department: Payer: Medicare Other

## 2018-10-03 ENCOUNTER — Encounter: Payer: Self-pay | Admitting: Emergency Medicine

## 2018-10-03 ENCOUNTER — Other Ambulatory Visit: Payer: Self-pay

## 2018-10-03 ENCOUNTER — Emergency Department
Admission: EM | Admit: 2018-10-03 | Discharge: 2018-10-03 | Disposition: A | Discharge status: Home / Self Care | Payer: Medicare Other | Attending: Emergency Medicine | Admitting: Emergency Medicine

## 2018-10-03 DIAGNOSIS — R1084 Generalized abdominal pain: Secondary | ICD-10-CM | POA: Diagnosis present

## 2018-10-03 DIAGNOSIS — I120 Hypertensive chronic kidney disease with stage 5 chronic kidney disease or end stage renal disease: Secondary | ICD-10-CM | POA: Insufficient documentation

## 2018-10-03 DIAGNOSIS — E039 Hypothyroidism, unspecified: Secondary | ICD-10-CM | POA: Diagnosis not present

## 2018-10-03 DIAGNOSIS — N185 Chronic kidney disease, stage 5: Secondary | ICD-10-CM | POA: Insufficient documentation

## 2018-10-03 DIAGNOSIS — Z87891 Personal history of nicotine dependence: Secondary | ICD-10-CM | POA: Insufficient documentation

## 2018-10-03 DIAGNOSIS — I82402 Acute embolism and thrombosis of unspecified deep veins of left lower extremity: Secondary | ICD-10-CM | POA: Diagnosis not present

## 2018-10-03 LAB — URINALYSIS, COMPLETE (UACMP) WITH MICROSCOPIC
BACTERIA UA: NONE SEEN
Bilirubin Urine: NEGATIVE
Glucose, UA: NEGATIVE mg/dL
Ketones, ur: NEGATIVE mg/dL
Nitrite: NEGATIVE
Protein, ur: 30 mg/dL — AB
Specific Gravity, Urine: 1.018 (ref 1.005–1.030)
pH: 5 (ref 5.0–8.0)

## 2018-10-03 LAB — CBC
HCT: 36.2 % (ref 36.0–46.0)
Hemoglobin: 11.3 g/dL — ABNORMAL LOW (ref 12.0–15.0)
MCH: 29.4 pg (ref 26.0–34.0)
MCHC: 31.2 g/dL (ref 30.0–36.0)
MCV: 94 fL (ref 80.0–100.0)
Platelets: 127 10*3/uL — ABNORMAL LOW (ref 150–400)
RBC: 3.85 MIL/uL — ABNORMAL LOW (ref 3.87–5.11)
RDW: 13.8 % (ref 11.5–15.5)
WBC: 2.9 10*3/uL — AB (ref 4.0–10.5)
nRBC: 0 % (ref 0.0–0.2)

## 2018-10-03 LAB — COMPREHENSIVE METABOLIC PANEL
ALT: 12 U/L (ref 0–44)
AST: 18 U/L (ref 15–41)
Albumin: 3.9 g/dL (ref 3.5–5.0)
Alkaline Phosphatase: 58 U/L (ref 38–126)
Anion gap: 5 (ref 5–15)
BUN: 18 mg/dL (ref 8–23)
CO2: 26 mmol/L (ref 22–32)
Calcium: 9 mg/dL (ref 8.9–10.3)
Chloride: 109 mmol/L (ref 98–111)
Creatinine, Ser: 1.07 mg/dL — ABNORMAL HIGH (ref 0.44–1.00)
GFR calc Af Amer: 51 mL/min — ABNORMAL LOW (ref >60)
GFR calc non Af Amer: 44 mL/min — ABNORMAL LOW (ref >60)
Glucose, Bld: 108 mg/dL — ABNORMAL HIGH (ref 70–99)
Potassium: 3.9 mmol/L (ref 3.5–5.1)
Sodium: 140 mmol/L (ref 135–145)
Total Bilirubin: 0.7 mg/dL (ref 0.3–1.2)
Total Protein: 7 g/dL (ref 6.5–8.1)

## 2018-10-03 LAB — TROPONIN I: Troponin I: <0.03 ng/mL (ref <0.03)

## 2018-10-03 MED ORDER — APIXABAN 2.5 MG PO TABS
2.5000 mg | ORAL_TABLET | Freq: Once | ORAL | Status: AC
  Administered 2018-10-03: 2.5 mg via ORAL
  Filled 2018-10-03: qty 1

## 2018-10-03 MED ORDER — APIXABAN 2.5 MG PO TABS: 2.5000 mg | ORAL_TABLET | Freq: Two times a day (BID) | ORAL | 0 refills | Status: DC

## 2018-10-03 NOTE — ED Triage Notes (Signed)
Pt reports pain in her right side that sometimes goes down into her knee for some time now. Pt denies injuries. Pt denies SOB, admits that sometimes her chest will hurt. Denies urinary sx's.

## 2018-10-03 NOTE — ED Notes (Signed)
Patient transported to CT 

## 2018-10-03 NOTE — ED Provider Notes (Signed)
[facility] Emergency Department Provider Note       Time seen: ----------------------------------------- 12:38 PM on 10/03/2018 -----------------------------------------   I have reviewed the triage vital signs and the nursing notes.  HISTORY   Chief Complaint Flank Pain and Knee Pain    HPI Lori Pacheco is a 84 y.o. female with a history of hyperlipidemia, hypertension, pancytopenia who presents to the ED for central abdominal pain that radiated into her right flank and then down into her right hip.  Patient reports she has had this happen before, nothing makes it better.  Sometimes she admits her chest will hurt.  She denies any urinary symptoms.  She denies fevers, chills or other complaints.  Past Medical History:  Diagnosis Date  . Hyperlipidemia   . Hypertension   . Thyroid disease     Patient Active Problem List   Diagnosis Date Noted  . Other constipation 04/23/2018  . Leg cramps 04/23/2018  . Pancytopenia ([place]) 07/24/2017  . Thrombocytopenia ([place]) 05/15/2017  . Secondary renal hyperparathyroidism ([place]) 05/15/2017  . Atherosclerosis of abdominal aorta ([place]) 11/10/2016  . Other neutropenia ([place]) 11/10/2016  . Perennial allergic rhinitis with seasonal variation 11/10/2016  . Vitamin D deficiency 03/29/2016  . Hypothyroidism 09/18/2015  . Essential hypertension 05/19/2015  . Hyperlipemia 05/19/2015  . Chronic kidney disease, stage IV (severe) ([place]) 05/19/2015    Past Surgical History:  Procedure Laterality Date  . NO PAST SURGERIES      Allergies Patient has no known allergies.  Social History Social History   Tobacco Use  . Smoking status: Never Smoker  . Smokeless tobacco: Former Systems developer    Types: Snuff  . Tobacco comment: smoking cessation materials not required  Substance Use Topics  . Alcohol use: No    Alcohol/week: 0.0 standard drinks  . Drug use: No   Review of Systems Constitutional: Negative for  fever. Cardiovascular: Negative for chest pain. Respiratory: Negative for shortness of breath. Gastrointestinal: Positive for abdominal pain Musculoskeletal: Positive for right leg pain Skin: Negative for rash. Neurological: Negative for headaches, focal weakness or numbness.  All systems negative/normal/unremarkable except as stated in the HPI  ____________________________________________   PHYSICAL EXAM:  VITAL SIGNS: ED Triage Vitals  Enc Vitals Group     BP 10/03/18 1013 (!) 157/88     Pulse Rate 10/03/18 1013 65     Resp 10/03/18 1013 18     Temp 10/03/18 1013 98.2 F (36.8 C)     Temp Source 10/03/18 1013 Oral     SpO2 10/03/18 1013 100 %     Weight 10/03/18 1014 115 lb (52.2 kg)     Height 10/03/18 1014 4\' 10"  (1.473 m)     Head Circumference --      Peak Flow --      Pain Score 10/03/18 1014 8     Pain Loc --      Pain Edu? --      Excl. in [place]? --    Constitutional: Alert and oriented. Well appearing and in no distress. ENT   Head: Normocephalic and atraumatic.   Nose: No congestion/rhinnorhea.   Mouth/Throat: Mucous membranes are moist.   Neck: No stridor. Cardiovascular: Normal rate, regular rhythm. No murmurs, rubs, or gallops. Respiratory: Normal respiratory effort without tachypnea nor retractions. Breath sounds are clear and equal bilaterally. No wheezes/rales/rhonchi. Gastrointestinal: Soft and nontender. Normal bowel sounds Musculoskeletal: Nontender with normal range of motion in extremities. No lower extremity tenderness nor edema.  Superficial varicosities  in the lower extremities, palpable thrombosis in the right thigh over a large varicosity Neurologic:  Normal speech and language. No gross focal neurologic deficits are appreciated.  Skin:  Skin is warm, dry and intact. No rash noted. Psychiatric: Mood and affect are normal. Speech and behavior are normal.  ____________________________________________  EKG: Interpreted by me.  Sinus  bradycardia with first-degree AV block, left anterior fascicular block, LVH, normal QT  ____________________________________________  ED COURSE:  As part of my medical decision making, I reviewed the following data within the [place] History obtained from family if available, nursing notes, old chart and ekg, as well as notes from prior ED visits. Patient presented for abdominal pain, we will assess with labs and imaging as indicated at this time.   Procedures ____________________________________________   LABS (pertinent positives/negatives)  Labs Reviewed  URINALYSIS, COMPLETE (UACMP) WITH MICROSCOPIC - Abnormal; Notable for the following components:      Result Value   Color, Urine YELLOW (*)    APPearance CLEAR (*)    Hgb urine dipstick MODERATE (*)    Protein, ur 30 (*)    Leukocytes, UA TRACE (*)    All other components within normal limits  CBC - Abnormal; Notable for the following components:   WBC 2.9 (*)    RBC 3.85 (*)    Hemoglobin 11.3 (*)    Platelets 127 (*)    All other components within normal limits  COMPREHENSIVE METABOLIC PANEL - Abnormal; Notable for the following components:   Glucose, Bld 108 (*)    Creatinine, Ser 1.07 (*)    GFR calc non Af Amer 44 (*)    GFR calc Af Amer 51 (*)    All other components within normal limits  TROPONIN I    RADIOLOGY Images were viewed by me  CT renal protocol, right lower extremity ultrasound IMPRESSION: Positive for mobile deep venous thrombosis in the left common femoral vein. Thrombus in the left common femoral vein dislodged during the examination and presumably migrated proximally.  Negative for deep venous thrombosis in right lower extremity.  These results were called by telephone at the time of interpretation on 10/03/2018 at 2:10 pm to Dr. Lenise Arena , who verbally acknowledged these results. IMPRESSION: Chronic changes similar to that seen on the prior exam.  Tiny nonobstructing left renal stone is seen.  No acute abnormality noted. ____________________________________________  DIFFERENTIAL DIAGNOSIS   DVT, superficial phlebitis, gastroenteritis, renal colic, UTI, pyelonephritis, AAA  FINAL ASSESSMENT AND PLAN  Abdominal pain, DVT   Plan: The patient had presented for abdominal pain. Patient's labs are reassuring. Patient's imaging did reveal a proximal left DVT which then propagated.  She does not have symptoms of a PE.  I discussed with vascular surgery Dr.Schnier and we will start her on Eliquis 2.5 mg twice a day and he will follow-up with her as an outpatient.  Unclear etiology for her other symptoms.   Laurence Aly, MD   Note: This note was generated in part or whole with voice recognition software. Voice recognition is usually quite accurate but there are transcription errors that can and very often do occur. I apologize for any typographical errors that were not detected and corrected.     Earleen Newport, MD 10/03/18 [phone]

## 2018-10-03 NOTE — ED Notes (Signed)
First Nurse Note: Patient indicates right upper abdomen as source of pain.  Alert and oriented.  Declines WC.

## 2018-10-16 ENCOUNTER — Encounter: Payer: Self-pay | Admitting: Family Medicine

## 2018-10-16 ENCOUNTER — Ambulatory Visit (INDEPENDENT_AMBULATORY_CARE_PROVIDER_SITE_OTHER): Payer: Medicare Other | Admitting: Family Medicine

## 2018-10-16 VITALS — BP 146/88 | HR 61 | Temp 98.1°F | Resp 16 | Ht 61.0 in | Wt 126.8 lb

## 2018-10-16 DIAGNOSIS — K5909 Other constipation: Secondary | ICD-10-CM

## 2018-10-16 DIAGNOSIS — E039 Hypothyroidism, unspecified: Secondary | ICD-10-CM

## 2018-10-16 DIAGNOSIS — I10 Essential (primary) hypertension: Secondary | ICD-10-CM | POA: Diagnosis not present

## 2018-10-16 DIAGNOSIS — I82412 Acute embolism and thrombosis of left femoral vein: Secondary | ICD-10-CM

## 2018-10-16 DIAGNOSIS — E559 Vitamin D deficiency, unspecified: Secondary | ICD-10-CM

## 2018-10-16 DIAGNOSIS — N184 Chronic kidney disease, stage 4 (severe): Secondary | ICD-10-CM

## 2018-10-16 LAB — TSH: TSH: 7.76 mIU/L — ABNORMAL HIGH (ref 0.40–4.50)

## 2018-10-16 NOTE — Progress Notes (Signed)
Name: Lori Pacheco   MRN: [id number]    DOB: Jan 12, 1921   Date:10/16/2018       Progress Note  Subjective  Chief Complaint  Chief Complaint  Patient presents with  . Follow-up    6 week F/U  . Hypertension    Denies any symptoms-still taking Amlodipine and Lisinopril  . Hypothyroidism    Stays cold all the time    HPI  Pt presents with her daughter, [name], for follow up.  They discussed that Ms. Hecker does not want to be on any medications that she does not absolutely have to remain on.    HTN: she has CKI , she declines to go to nephrologist, she would not want to do HD. She is complaining of itchy skin at times, normal urine output. Taking bp medication as prescribed and she is at goal today (<150/90). She denies chest pain, shortness of breath, or palpitation. She denies dizziness, lightheadedness.  Does have some BLE edema that is mild and resolves with elevation at night.  Taking amlodipine 5mg , lisinopril 20mg ;  hydralazine 10mg  only PRN if >160/100.  Hypothyroidism: weight is stable today, continues to have dry skin, she takes milk of magnesia prn for constipation. She is due for a TSH recheck today. On 154mcg daily synthroid right now.  No palpitations.  Vitamin D Deficiency: Taking once weekly. Doing well with this supplement.  DVT in LLE: She was seen in ER on 10/03/2018.  DVT on LLE (Left common femoral vein) was found - she was started on Eliquis when she was in the ER, she took several doses, but bumped her finger and her LLE and bruised very easily, so she stopped the medication - bruising has since healed. Results stated "Positive for mobile deep venous thrombosis in the left common femoral vein. Thrombus in the left common femoral vein dislodged during the examination and presumably migrated proximally."  Discussed risk/benefit of anticoagulation, she and her daughter state that they do not want to continue anticoagulation therapy at this time.  She denies BLE pain,  tenderness or redness, dose have varicose veins in the LLE, she denies any chest pain or shortness of breath.   Patient Active Problem List   Diagnosis Date Noted  . Other constipation 04/23/2018  . Leg cramps 04/23/2018  . Pancytopenia ([place]) 07/24/2017  . Thrombocytopenia ([place]) 05/15/2017  . Secondary renal hyperparathyroidism ([place]) 05/15/2017  . Atherosclerosis of abdominal aorta ([place]) 11/10/2016  . Other neutropenia ([place]) 11/10/2016  . Perennial allergic rhinitis with seasonal variation 11/10/2016  . Vitamin D deficiency 03/29/2016  . Hypothyroidism 09/18/2015  . Essential hypertension 05/19/2015  . Hyperlipemia 05/19/2015  . Chronic kidney disease, stage IV (severe) ([place]) 05/19/2015    Past Surgical History:  Procedure Laterality Date  . NO PAST SURGERIES      Family History  Problem Relation Age of Onset  . Healthy Mother   . Healthy Father   . Heart disease Brother   . Cancer Brother   . Cancer Daughter   . Alcohol abuse Son   . Diabetes Son   . Heart disease Son     Social History   Socioeconomic History  . Marital status: Widowed    Spouse name: Jeneen Rinks  . Number of children: 110  . Years of education: Not on file  . Highest education level: 3rd grade  Occupational History  . Occupation: Retired  Scientific laboratory technician  . Financial resource strain: Not hard at all  . Food insecurity:  Worry: Never true    Inability: Never true  . Transportation needs:    Medical: No    Non-medical: No  Tobacco Use  . Smoking status: Never Smoker  . Smokeless tobacco: Former Systems developer    Types: Snuff  . Tobacco comment: smoking cessation materials not required  Substance and Sexual Activity  . Alcohol use: No    Alcohol/week: 0.0 standard drinks  . Drug use: No  . Sexual activity: Not Currently  Lifestyle  . Physical activity:    Days per week: 7 days    Minutes per session: 60 min  . Stress: Not at all  Relationships  . Social connections:    Talks on phone: Twice a week      Gets together: Twice a week    Attends religious service: More than 4 times per year    Active member of club or organization: No    Attends meetings of clubs or organizations: Never    Relationship status: Widowed  . Intimate partner violence:    Fear of current or ex partner: No    Emotionally abused: No    Physically abused: No    Forced sexual activity: No  Other Topics Concern  . Not on file  Social History Narrative         Current Outpatient Medications:  .  acetaminophen (TYLENOL) 500 MG tablet, Take 1 tablet (500 mg total) by mouth every 6 (six) hours as needed., Disp: 100 tablet, Rfl: 0 .  amLODipine (NORVASC) 5 MG tablet, Take 1 tablet (5 mg total) by mouth daily., Disp: 30 tablet, Rfl: 5 .  levothyroxine (SYNTHROID, LEVOTHROID) 100 MCG tablet, Take 1 tablet (100 mcg total) by mouth daily. New dose, stop 112, Disp: 30 tablet, Rfl: 1 .  lisinopril (PRINIVIL,ZESTRIL) 20 MG tablet, Take 1 tablet (20 mg total) by mouth daily., Disp: 30 tablet, Rfl: 5 .  Vitamin D, Ergocalciferol, (DRISDOL) 50000 units CAPS capsule, Take 1 capsule (50,000 Units total) by mouth once a week., Disp: 12 capsule, Rfl: 0 .  apixaban (ELIQUIS) 2.5 MG TABS tablet, Take 1 tablet (2.5 mg total) by mouth 2 (two) times daily. (Patient not taking: Reported on 10/16/2018), Disp: 42 tablet, Rfl: 0 .  hydrALAZINE (APRESOLINE) 10 MG tablet, Take 1 tablet (10 mg total) by mouth every 6 (six) hours as needed. IF BP IS ABOVE 160/90 (Patient not taking: Reported on 09/03/2018), Disp: 30 tablet, Rfl: 1  No Known Allergies  I personally reviewed active problem list, medication list, allergies, notes from last encounter, lab results with the patient/caregiver today.   ROS Constitutional: Negative for fever or weight change.  Respiratory: Negative for cough and shortness of breath.   Cardiovascular: Negative for chest pain or palpitations.  Gastrointestinal: Negative for abdominal pain, no bowel changes.   Musculoskeletal: Negative for gait problem or joint swelling.  Skin: Negative for rash.  Neurological: Negative for dizziness or headache.  No other specific complaints in a complete review of systems (except as listed in HPI above).  Objective  Vitals:   10/16/18 1408  BP: (!) 146/88  Pulse: 61  Resp: 16  Temp: 98.1 F (36.7 C)  TempSrc: Oral  SpO2: 96%  Weight: 126 lb 12.8 oz (57.5 kg)  Height: 5\' 1"  (1.549 m)    Body mass index is 23.96 kg/m.  Physical Exam Constitutional: Patient appears well-developed and well-nourished. No distress.  HENT: Head: Normocephalic and atraumatic.  Eyes: Conjunctivae and EOM are normal. No scleral icterus.  Pupils  are equal, round, and reactive to light.  Neck: Normal range of motion. Neck supple. No JVD present. Cardiovascular: Normal rate, regular rhythm and normal heart sounds.  No murmur heard. Pulmonary/Chest: Effort normal and breath sounds normal. No respiratory distress. Abdominal: Soft. Bowel sounds are normal, no distension. There is no tenderness. No masses. Musculoskeletal: Normal range of motion, no joint effusions. No gross deformities.  Varisoe veins palpable to the left upper posterior calf, otherwise BLE have mild non-pitting edema, no tenderness, no erythema. Neurological: Pt is alert and oriented to person, place, and time. No cranial nerve deficit. Coordination, balance, strength, speech and gait are normal.  Skin: Skin is warm and dry. No rash noted. No erythema.  Psychiatric: Patient has a normal mood and affect. behavior is normal. Judgment and thought content normal.   No results found for this or any previous visit (from the past 72 hour(s)).  PHQ2/9: Depression screen [facility] 2/9 10/16/2018 07/23/2018 05/22/2018 12/22/2017 12/22/2017  Decreased Interest 0 0 2 0 0  Down, Depressed, Hopeless 1 2 2  0 0  PHQ - 2 Score 1 2 4  0 0  Altered sleeping - 2 2 0 -  Tired, decreased energy - 0 2 0 -  Change in appetite - 1 2 0 -   Feeling bad or failure about yourself  - 0 2 0 -  Trouble concentrating - 2 2 0 -  Moving slowly or fidgety/restless - 0 0 0 -  Suicidal thoughts - 0 2 0 -  PHQ-9 Score - 7 16 0 -  Difficult doing work/chores - Not difficult at all Not difficult at all Not difficult at all -   Fall Risk: Fall Risk  10/16/2018 07/23/2018 05/22/2018 02/21/2018 02/21/2018  Falls in the past year? 1 No No Yes Yes  Comment - - - - -  Number falls in past yr: 1 - - - 2 or more  Injury with Fall? 0 - - - No  Risk Factor Category  - - - - High Fall Risk  Comment - - - - -  Risk for fall due to : Other (Comment) - Impaired balance/gait - -  Risk for fall due to: Comment Gets in a hurry - - - -  Follow up - - - - Falls prevention discussed;Education provided    Functional Status Survey: Is the patient deaf or have difficulty hearing?: No Does the patient have difficulty seeing, even when wearing glasses/contacts?: No Does the patient have difficulty concentrating, remembering, or making decisions?: No Does the patient have difficulty walking or climbing stairs?: No Does the patient have difficulty dressing or bathing?: No Does the patient have difficulty doing errands alone such as visiting a doctor's office or shopping?: Yes   Assessment & Plan  1. Deep vein thrombosis (DVT) of femoral vein of left lower extremity, unspecified chronicity ([place]) - She does not want to take Eliquis, discussion on risk/benefit of the medication is had with the patient and her daughter.  They both verbalize understanding of signs and symptoms of DVT, PE, MI, and Stroke.  We also discussed end of life care, and the need to create a living will.  They will consider this and consider DNR paperwork at future visit with PCP Dr. Ancil Boozer.  I strongly encouraged the patient to talk to her daughter about what her wishes would be if/when she is not able to express them herself.  Both verbalize understanding.  2. Essential hypertension - Stable  today; she does have hydralazine  to take PRN. Discussed higher goal BP of 150/90 due to advanced age  72. Acquired hypothyroidism - We will recheck TSH today.  4. Chronic kidney disease, stage IV (severe) ([place]) - Declines nephrology referral, CMP was performed in ER and appeared improved from previous check.  5. Vitamin D deficiency - Taking supplementation  6. Other constipation - Milk of Magnesia PRN, checking TSH today  Face-to-face time with patient was more than 25 minutes, >50% time spent counseling and coordination of care

## 2018-10-17 DIAGNOSIS — Z86718 Personal history of other venous thrombosis and embolism: Secondary | ICD-10-CM | POA: Insufficient documentation

## 2018-10-17 DIAGNOSIS — I82412 Acute embolism and thrombosis of left femoral vein: Secondary | ICD-10-CM | POA: Insufficient documentation

## 2018-10-18 ENCOUNTER — Other Ambulatory Visit: Payer: Self-pay | Admitting: Family Medicine

## 2018-10-18 DIAGNOSIS — E039 Hypothyroidism, unspecified: Secondary | ICD-10-CM

## 2018-11-08 ENCOUNTER — Telehealth: Payer: Self-pay | Admitting: Family Medicine

## 2018-11-08 ENCOUNTER — Other Ambulatory Visit: Payer: Self-pay | Admitting: Family Medicine

## 2018-11-08 DIAGNOSIS — E039 Hypothyroidism, unspecified: Secondary | ICD-10-CM

## 2018-11-08 NOTE — Telephone Encounter (Signed)
Copied from [place]. Topic: Quick Communication - Rx Refill/Question >> Nov 08, 2018  4:24 PM Rayann Heman wrote: Medication: levothyroxine (SYNTHROID, LEVOTHROID) 100 MCG tablet [[id number]]   Has the patient contacted their pharmacy? no Preferred Pharmacy (with phone number or street name): [facility] [address], [place] - [place] [phone] (Phone) [phone] (Fax)   Agent: Please be advised that RX refills may take up to 3 business days. We ask that you follow-up with your pharmacy.

## 2018-11-08 NOTE — Telephone Encounter (Signed)
Refill request for general medication: Levothyroxine 100 mcg  Last office visit: 10/16/2018  Last physical exam: 12/22/2017  Follow-ups on file. 12/25/2018

## 2018-11-21 ENCOUNTER — Ambulatory Visit: Payer: Self-pay | Admitting: *Deleted

## 2018-11-21 NOTE — Telephone Encounter (Signed)
Pt's daughter, [name], called stating that the pt says she felt like she was "losing her mind"; the pt says "something is biting her", and she complains of her stomach, side, and feet' these symptoms started 11/21/2018 at 1400; recommendations made per nurse triage protocol; she normally sees Dr Ancil Boozer but she has no availability; pt's daughter [name] accepted appointment on the pt's behalf, with Raelyn Ensign, [facility], 11/22/2018 at [place] [address]; [name] also says that if the pt gets worse, they will take her to the ED: will route to office for notification.     Reason for Disposition . [1] Longstanding confusion (e.g., dementia, stroke) AND [2] worsening  Answer Assessment - Initial Assessment Questions 1. LEVEL OF CONSCIOUSNESS: "How is he (she, the patient) acting right now?" (e.g., alert-oriented, confused, lethargic, stuporous, comatose)     Confused and anxious 2. ONSET: "When did the confusion start?"  (minutes, hours, days)     11/21/2018 at 1400 this episode; intermittent since at least October 2019 3. PATTERN "Does this come and go, or has it been constant since it started?"  "Is it present now?"     intermittent 4. ALCOHOL or DRUGS: "Has he been drinking alcohol or taking any drugs?"      no 5. NARCOTIC MEDICATIONS: "Has he been receiving any narcotic medications?" (e.g., morphine, Vicodin)     no 6. CAUSE: "What do you think is causing the confusion?"      Not sure 7. OTHER SYMPTOMS: "Are there any other symptoms?" (e.g., difficulty breathing, headache, fever, weakness)     no  Protocols used: CONFUSION - DELIRIUM-A-AH

## 2018-11-22 ENCOUNTER — Encounter: Payer: Self-pay | Admitting: Family Medicine

## 2018-11-22 ENCOUNTER — Ambulatory Visit: Payer: Medicare Other | Admitting: Family Medicine

## 2018-11-22 VITALS — BP 148/88 | HR 69 | Temp 97.9°F | Resp 14 | Ht 61.0 in | Wt 126.5 lb

## 2018-11-22 DIAGNOSIS — I82412 Acute embolism and thrombosis of left femoral vein: Secondary | ICD-10-CM

## 2018-11-22 DIAGNOSIS — N184 Chronic kidney disease, stage 4 (severe): Secondary | ICD-10-CM | POA: Diagnosis not present

## 2018-11-22 DIAGNOSIS — I10 Essential (primary) hypertension: Secondary | ICD-10-CM | POA: Diagnosis not present

## 2018-11-22 DIAGNOSIS — I700 Atherosclerosis of aorta: Secondary | ICD-10-CM

## 2018-11-22 DIAGNOSIS — R1013 Epigastric pain: Secondary | ICD-10-CM

## 2018-11-22 DIAGNOSIS — I83813 Varicose veins of bilateral lower extremities with pain: Secondary | ICD-10-CM

## 2018-11-22 DIAGNOSIS — L299 Pruritus, unspecified: Secondary | ICD-10-CM

## 2018-11-22 DIAGNOSIS — F320 Major depressive disorder, single episode, mild: Secondary | ICD-10-CM

## 2018-11-22 DIAGNOSIS — E039 Hypothyroidism, unspecified: Secondary | ICD-10-CM | POA: Diagnosis not present

## 2018-11-22 DIAGNOSIS — N2581 Secondary hyperparathyroidism of renal origin: Secondary | ICD-10-CM

## 2018-11-22 DIAGNOSIS — D708 Other neutropenia: Secondary | ICD-10-CM

## 2018-11-22 DIAGNOSIS — F419 Anxiety disorder, unspecified: Secondary | ICD-10-CM

## 2018-11-22 MED ORDER — HYDROXYZINE HCL 10 MG PO TABS: 10.0000 mg | ORAL_TABLET | Freq: Three times a day (TID) | ORAL | 0 refills | Status: DC | PRN

## 2018-11-22 NOTE — Progress Notes (Signed)
Name: Lori Pacheco   MRN: [id number]    DOB: [birth year]   Date:11/22/2018       Progress Note  Subjective  Chief Complaint  Chief Complaint  Patient presents with  . Leg Pain    intermittent pain that radiates down to her toes.  . Abdominal Pain    right sided abdominal pain and tightness  . Memory Loss    patient stated that she feels like she is losing her mind    HPI  She came in today with grand-daughter - Isa Rankin Patient is a poor historian, but seems calm.   Epigastric pain: she states she has epigastric pain after she eats, no weight loss, she cannot describe the pain, denies change in bowel movement. Pain goes from stomach to her toes. No nausea or vomiting.   Anxiety/Depression: she states she is not sad right now, but feels nervous inside, feels like little worms are crawling under her skin, intermittent, she states improves when she uses rubbing alcohol. She is willing to try prn medication such as hydroxyzine. She does not like taking medication daily and did not agree on taking SSRI in the past.   HTN: bp is high, but did not take any medications today, no chest pain or palpitation.   History of chronic DVT left: stable and no pain, not on blood thinners, high risk of fall  Right leg varicose veins: she states it has been sore for a long time ( not sure how long) , she rubs leg so hard that is bruised now. No swelling.   Hypothyroidism: she takes medications on and off, we will recheck TSH  Leucopenia: recheck labs, seen by hematologist in the past, but refuses further evaluation    Patient Active Problem List   Diagnosis Date Noted  . Deep vein thrombosis (DVT) of femoral vein of left lower extremity ([place]) 10/17/2018  . Other constipation 04/23/2018  . Leg cramps 04/23/2018  . Pancytopenia ([place]) 07/24/2017  . Thrombocytopenia ([place]) 05/15/2017  . Secondary renal hyperparathyroidism ([place]) 05/15/2017  . Atherosclerosis of abdominal aorta ([place]) 11/10/2016   . Other neutropenia ([place]) 11/10/2016  . Perennial allergic rhinitis with seasonal variation 11/10/2016  . Vitamin D deficiency 03/29/2016  . Hypothyroidism 09/18/2015  . Essential hypertension 05/19/2015  . Hyperlipemia 05/19/2015  . Chronic kidney disease, stage IV (severe) ([place]) 05/19/2015    Past Surgical History:  Procedure Laterality Date  . NO PAST SURGERIES      Family History  Problem Relation Age of Onset  . Healthy Mother   . Healthy Father   . Heart disease Brother   . Cancer Brother   . Cancer Daughter   . Alcohol abuse Son   . Diabetes Son   . Heart disease Son     Social History   Socioeconomic History  . Marital status: Widowed    Spouse name: Jeneen Rinks  . Number of children: 79  . Years of education: Not on file  . Highest education level: 3rd grade  Occupational History  . Occupation: Retired  Scientific laboratory technician  . Financial resource strain: Not hard at all  . Food insecurity:    Worry: Never true    Inability: Never true  . Transportation needs:    Medical: No    Non-medical: No  Tobacco Use  . Smoking status: Never Smoker  . Smokeless tobacco: Former Systems developer    Types: Snuff  . Tobacco comment: smoking cessation materials not required  Substance and Sexual Activity  .  Alcohol use: No    Alcohol/week: 0.0 standard drinks  . Drug use: No  . Sexual activity: Not Currently  Lifestyle  . Physical activity:    Days per week: 7 days    Minutes per session: 60 min  . Stress: Not at all  Relationships  . Social connections:    Talks on phone: Twice a week    Gets together: Twice a week    Attends religious service: More than 4 times per year    Active member of club or organization: No    Attends meetings of clubs or organizations: Never    Relationship status: Widowed  . Intimate partner violence:    Fear of current or ex partner: No    Emotionally abused: No    Physically abused: No    Forced sexual activity: No  Other Topics Concern  . Not on  file  Social History Narrative         Current Outpatient Medications:  .  acetaminophen (TYLENOL) 500 MG tablet, Take 1 tablet (500 mg total) by mouth every 6 (six) hours as needed., Disp: 100 tablet, Rfl: 0 .  amLODipine (NORVASC) 5 MG tablet, Take 1 tablet (5 mg total) by mouth daily., Disp: 30 tablet, Rfl: 5 .  hydrALAZINE (APRESOLINE) 10 MG tablet, Take 1 tablet (10 mg total) by mouth every 6 (six) hours as needed. IF BP IS ABOVE 160/90, Disp: 30 tablet, Rfl: 1 .  levothyroxine (SYNTHROID, LEVOTHROID) 100 MCG tablet, TAKE 1 TABLET BY MOUTH ONCE DAILY (NEW  DOSE  REPEAT  LEVELS  IN  SIX  WEEKS), Disp: 30 tablet, Rfl: 0 .  lisinopril (PRINIVIL,ZESTRIL) 20 MG tablet, Take 1 tablet (20 mg total) by mouth daily., Disp: 30 tablet, Rfl: 5 .  Vitamin D, Ergocalciferol, (DRISDOL) 50000 units CAPS capsule, Take 1 capsule (50,000 Units total) by mouth once a week., Disp: 12 capsule, Rfl: 0 .  hydrOXYzine (ATARAX/VISTARIL) 10 MG tablet, Take 1 tablet (10 mg total) by mouth 3 (three) times daily as needed for itching or anxiety., Disp: 90 tablet, Rfl: 0  No Known Allergies  I personally reviewed active problem list, medication list, allergies, family history, social history with the patient/caregiver today.   ROS  Constitutional: Negative for fever or weight change.  Respiratory: Negative for cough and shortness of breath.   Cardiovascular: Negative for chest pain or palpitations.  Gastrointestinal: positive for intermittent  abdominal pain, but no  bowel changes.  Musculoskeletal: Negative for gait problem or joint swelling.  Skin: Negative for rash.  Neurological: Negative for dizziness or headache.  No other specific complaints in a complete review of systems (except as listed in HPI above).   Objective  Vitals:   11/22/18 0936  BP: (!) 170/72  Pulse: 69  Resp: 14  Temp: 97.9 F (36.6 C)  TempSrc: Oral  SpO2: 97%  Weight: 126 lb 8 oz (57.4 kg)  Height: 5\' 1"  (1.549 m)     Body mass index is 23.9 kg/m.  Physical Exam  Constitutional: Patient appears well-developed and frail No distress.  HEENT: head atraumatic, normocephalic, pupils equal and reactive to light, neck supple, throat within normal limits Cardiovascular: Normal rate, regular rhythm and normal heart sounds.  No murmur heard. No BLE edema. She has large varicose veins on right quad, tender to touch and bruised, no erythema  Pulmonary/Chest: Effort normal and breath sounds normal. No respiratory distress. Abdominal: Soft.  There is no tenderness. Skin: dry but no excoriations  Psychiatric:  Patient has a normal mood and affect. behavior is normal. Judgment and thought content normal.  Recent Results (from the past 2160 hour(s))  TSH     Status: Abnormal   Collection Time: 09/03/18  3:40 PM  Result Value Ref Range   TSH 9.75 (H) 0.40 - 4.50 mIU/L  Urinalysis, Complete w Microscopic     Status: Abnormal   Collection Time: 10/03/18 10:33 AM  Result Value Ref Range   Color, Urine YELLOW (A) YELLOW   APPearance CLEAR (A) CLEAR   Specific Gravity, Urine 1.018 1.005 - 1.030   pH 5.0 5.0 - 8.0   Glucose, UA NEGATIVE NEGATIVE mg/dL   Hgb urine dipstick MODERATE (A) NEGATIVE   Bilirubin Urine NEGATIVE NEGATIVE   Ketones, ur NEGATIVE NEGATIVE mg/dL   Protein, ur 30 (A) NEGATIVE mg/dL   Nitrite NEGATIVE NEGATIVE   Leukocytes, UA TRACE (A) NEGATIVE   RBC / HPF 6-10 0 - 5 RBC/hpf   WBC, UA 0-5 0 - 5 WBC/hpf   Bacteria, UA NONE SEEN NONE SEEN   Squamous Epithelial / LPF 0-5 0 - 5   Mucus PRESENT     Comment: Performed at [facility], [address] [place], [place] [address]  CBC     Status: Abnormal   Collection Time: 10/03/18 10:33 AM  Result Value Ref Range   WBC 2.9 (L) 4.0 - 10.5 K/uL   RBC 3.85 (L) 3.87 - 5.11 MIL/uL   Hemoglobin 11.3 (L) 12.0 - 15.0 g/dL   HCT 36.2 36.0 - 46.0 %   MCV 94.0 80.0 - 100.0 fL   MCH 29.4 26.0 - 34.0 pg   MCHC 31.2 30.0 - 36.0 g/dL   RDW  13.8 11.5 - 15.5 %   Platelets 127 (L) 150 - 400 K/uL   nRBC 0.0 0.0 - 0.2 %    Comment: Performed at [facility], [address] [place], [place] [address]  Troponin I - ONCE - STAT     Status: None   Collection Time: 10/03/18 10:33 AM  Result Value Ref Range   Troponin I <0.03 <0.03 ng/mL    Comment: Performed at [facility], [address] [place], [place] [address]  Comprehensive metabolic panel     Status: Abnormal   Collection Time: 10/03/18 10:33 AM  Result Value Ref Range   Sodium 140 135 - 145 mmol/L   Potassium 3.9 3.5 - 5.1 mmol/L   Chloride 109 98 - 111 mmol/L   CO2 26 22 - 32 mmol/L   Glucose, Bld 108 (H) 70 - 99 mg/dL   BUN 18 8 - 23 mg/dL   Creatinine, Ser 1.07 (H) 0.44 - 1.00 mg/dL   Calcium 9.0 8.9 - 10.3 mg/dL   Total Protein 7.0 6.5 - 8.1 g/dL   Albumin 3.9 3.5 - 5.0 g/dL   AST 18 15 - 41 U/L   ALT 12 0 - 44 U/L   Alkaline Phosphatase 58 38 - 126 U/L   Total Bilirubin 0.7 0.3 - 1.2 mg/dL   GFR calc non Af Amer 44 (L) >60 mL/min   GFR calc Af Amer 51 (L) >60 mL/min   Anion gap 5 5 - 15    Comment: Performed at [facility], [address] [place], [place] [address]  TSH     Status: Abnormal   Collection Time: 10/16/18  2:53 PM  Result Value Ref Range   TSH 7.76 (H) 0.40 - 4.50 mIU/L  PHQ2/9: Depression screen [facility] 2/9 11/22/2018 10/16/2018 07/23/2018 05/22/2018 12/22/2017  Decreased Interest 0 0 0 2 0  Down, Depressed, Hopeless 0 1 2 2  0  PHQ - 2 Score 0 1 2 4  0  Altered sleeping - - 2 2 0  Tired, decreased energy - - 0 2 0  Change in appetite - - 1 2 0  Feeling bad or failure about yourself  - - 0 2 0  Trouble concentrating - - 2 2 0  Moving slowly or fidgety/restless - - 0 0 0  Suicidal thoughts - - 0 2 0  PHQ-9 Score - - 7 16 0  Difficult doing work/chores - - Not difficult at all Not difficult at all Not difficult at all     Fall Risk: Fall Risk  11/22/2018 10/16/2018 07/23/2018 05/22/2018 02/21/2018  Falls in the  past year? 1 1 No No Yes  Comment - - - - -  Number falls in past yr: 1 1 - - -  Injury with Fall? 0 0 - - -  Risk Factor Category  - - - - -  Comment - - - - -  Risk for fall due to : - Other (Comment) - Impaired balance/gait -  Risk for fall due to: Comment - Gets in a hurry - - -  Follow up - - - - -     Functional Status Survey: Is the patient deaf or have difficulty hearing?: No Does the patient have difficulty seeing, even when wearing glasses/contacts?: No Does the patient have difficulty concentrating, remembering, or making decisions?: No Does the patient have difficulty walking or climbing stairs?: Yes Does the patient have difficulty dressing or bathing?: No Does the patient have difficulty doing errands alone such as visiting a doctor's office or shopping?: Yes   Assessment & Plan  1. Deep vein thrombosis (DVT) of femoral vein of left lower extremity, unspecified chronicity (HCC)  Stable   2. Chronic kidney disease, stage IV (severe) (HCC)  - COMPLETE METABOLIC PANEL WITH GFR - CBC with Differential/Platelet - Parathyroid hormone, intact (no Ca)  3. Essential hypertension  Continue medication, she was nervous when she came in, it goes down to 130's/80's back in the Fall   4. Acquired hypothyroidism  - TSH  5. Mild major depression (HCC)  stable  6. Other neutropenia (HCC)  - CBC with Differential/Platelet  7. Pruritus  - hydrOXYzine (ATARAX/VISTARIL) 10 MG tablet; Take 1 tablet (10 mg total) by mouth 3 (three) times daily as needed for itching or anxiety.  Dispense: 90 tablet; Refill: 0  8. Anxiety  - hydrOXYzine (ATARAX/VISTARIL) 10 MG tablet; Take 1 tablet (10 mg total) by mouth 3 (three) times daily as needed for itching or anxiety.  Dispense: 90 tablet; Refill: 0  9. Secondary renal hyperparathyroidism ([place])  Recheck labs   10. Atherosclerosis of abdominal aorta (HCC)  Not on statins, she is 89 and refuses medication   11. Varicose  veins of both lower extremities with pain  - Ambulatory referral to Vascular Surgery   12. Epigastric pain  Try otc tums or rollaids for now

## 2018-11-23 LAB — COMPLETE METABOLIC PANEL WITH GFR
AG Ratio: 1.4 (calc) (ref 1.0–2.5)
ALT: 8 U/L (ref 6–29)
AST: 16 U/L (ref 10–35)
Albumin: 4.1 g/dL (ref 3.6–5.1)
Alkaline phosphatase (APISO): 57 U/L (ref 37–153)
BUN / CREAT RATIO: 18 (calc) (ref 6–22)
BUN: 22 mg/dL (ref 7–25)
CO2: 28 mmol/L (ref 20–32)
CREATININE: 1.19 mg/dL — AB (ref 0.60–0.88)
Calcium: 9.5 mg/dL (ref 8.6–10.4)
Chloride: 106 mmol/L (ref 98–110)
GFR, Est African American: 45 mL/min/{1.73_m2} — ABNORMAL LOW (ref >=60)
GFR, Est Non African American: 39 mL/min/{1.73_m2} — ABNORMAL LOW (ref >=60)
Globulin: 2.9 g/dL (calc) (ref 1.9–3.7)
Glucose, Bld: 94 mg/dL (ref 65–99)
Potassium: 4.5 mmol/L (ref 3.5–5.3)
Sodium: 142 mmol/L (ref 135–146)
Total Bilirubin: 0.5 mg/dL (ref 0.2–1.2)
Total Protein: 7 g/dL (ref 6.1–8.1)

## 2018-11-23 LAB — CBC WITH DIFFERENTIAL/PLATELET
Absolute Monocytes: 163 cells/uL — ABNORMAL LOW (ref 200–950)
Basophils Absolute: 20 cells/uL (ref 0–200)
Basophils Relative: 0.8 %
Eosinophils Absolute: 10 cells/uL — ABNORMAL LOW (ref 15–500)
Eosinophils Relative: 0.4 %
HCT: 36.2 % (ref 35.0–45.0)
Hemoglobin: 11.8 g/dL (ref 11.7–15.5)
Lymphs Abs: 793 cells/uL — ABNORMAL LOW (ref 850–3900)
MCH: 29.6 pg (ref 27.0–33.0)
MCHC: 32.6 g/dL (ref 32.0–36.0)
MCV: 91 fL (ref 80.0–100.0)
MPV: 10.9 fL (ref 7.5–12.5)
Monocytes Relative: 6.5 %
Neutro Abs: 1515 cells/uL (ref 1500–7800)
Neutrophils Relative %: 60.6 %
Platelets: 142 10*3/uL (ref 140–400)
RBC: 3.98 10*6/uL (ref 3.80–5.10)
RDW: 12.5 % (ref 11.0–15.0)
Total Lymphocyte: 31.7 %
WBC: 2.5 10*3/uL — ABNORMAL LOW (ref 3.8–10.8)

## 2018-11-23 LAB — TSH: TSH: 17.04 mIU/L — ABNORMAL HIGH (ref 0.40–4.50)

## 2018-11-23 LAB — PARATHYROID HORMONE, INTACT (NO CA): PTH: 91 pg/mL — ABNORMAL HIGH (ref 14–64)

## 2018-11-23 NOTE — Telephone Encounter (Signed)
Please advise 

## 2018-12-04 ENCOUNTER — Ambulatory Visit (INDEPENDENT_AMBULATORY_CARE_PROVIDER_SITE_OTHER): Payer: Medicare Other | Admitting: Vascular Surgery

## 2018-12-04 ENCOUNTER — Encounter (INDEPENDENT_AMBULATORY_CARE_PROVIDER_SITE_OTHER): Payer: Self-pay | Admitting: Vascular Surgery

## 2018-12-04 VITALS — BP 182/84 | HR 60 | Resp 16 | Ht 62.0 in | Wt 126.0 lb

## 2018-12-04 DIAGNOSIS — I83813 Varicose veins of bilateral lower extremities with pain: Secondary | ICD-10-CM

## 2018-12-04 DIAGNOSIS — N184 Chronic kidney disease, stage 4 (severe): Secondary | ICD-10-CM

## 2018-12-04 DIAGNOSIS — I10 Essential (primary) hypertension: Secondary | ICD-10-CM

## 2018-12-04 DIAGNOSIS — E782 Mixed hyperlipidemia: Secondary | ICD-10-CM

## 2018-12-04 NOTE — Assessment & Plan Note (Signed)
blood pressure control important in reducing the progression of atherosclerotic disease. On appropriate oral medications.  

## 2018-12-04 NOTE — Progress Notes (Signed)
Patient ID: Lori Pacheco, female   DOB: Jan 02, 1921, 85 y.o.   MRN: [id number]  Chief Complaint  Patient presents with  . New Patient (Initial Visit)    ref [place] for varicose veins    HPI Lori Pacheco is a 85 y.o. female.  I am asked to see the patient by Dr. Ancil Boozer for evaluation of leg pain with prominent varicose veins.  The patient presents with complaints of symptomatic varicosities of the lower extremities bilaterally. The patient reports a long standing history of varicosities and they have become painful over time. There was no clear inciting event or causative factor that started the symptoms.  The left leg is more severly affected. The patient elevates the legs for relief. The pain is described as stinging, burning, and itching particularly overlying the varicosities. The symptoms are generally most severe in the evening, particularly when they have been on their feet for long periods of time.  Elevation has been used to try to improve the symptoms with did success. The patient complains of intermittent but worsening swelling as an associated symptom.     Past Medical History:  Diagnosis Date  . Hyperlipidemia   . Hypertension   . Thyroid disease     Past Surgical History:  Procedure Laterality Date  . NO PAST SURGERIES      Family History  Problem Relation Age of Onset  . Healthy Mother   . Healthy Father   . Heart disease Brother   . Cancer Brother   . Cancer Daughter   . Alcohol abuse Son   . Diabetes Son   . Heart disease Son      Social History Social History   Tobacco Use  . Smoking status: Never Smoker  . Smokeless tobacco: Former Systems developer    Types: Snuff  . Tobacco comment: smoking cessation materials not required  Substance Use Topics  . Alcohol use: No    Alcohol/week: 0.0 standard drinks  . Drug use: No    No Known Allergies  Current Outpatient Medications  Medication Sig Dispense Refill  . acetaminophen (TYLENOL) 500 MG tablet Take  1 tablet (500 mg total) by mouth every 6 (six) hours as needed. 100 tablet 0  . amLODipine (NORVASC) 5 MG tablet Take 1 tablet (5 mg total) by mouth daily. 30 tablet 5  . hydrALAZINE (APRESOLINE) 10 MG tablet Take 1 tablet (10 mg total) by mouth every 6 (six) hours as needed. IF BP IS ABOVE 160/90 30 tablet 1  . hydrOXYzine (ATARAX/VISTARIL) 10 MG tablet Take 1 tablet (10 mg total) by mouth 3 (three) times daily as needed for itching or anxiety. 90 tablet 0  . levothyroxine (SYNTHROID, LEVOTHROID) 100 MCG tablet TAKE 1 TABLET BY MOUTH ONCE DAILY (NEW  DOSE  REPEAT  LEVELS  IN  SIX  WEEKS) 30 tablet 0  . lisinopril (PRINIVIL,ZESTRIL) 20 MG tablet Take 1 tablet (20 mg total) by mouth daily. 30 tablet 5  . Vitamin D, Ergocalciferol, (DRISDOL) 50000 units CAPS capsule Take 1 capsule (50,000 Units total) by mouth once a week. 12 capsule 0   No current facility-administered medications for this visit.       REVIEW OF SYSTEMS (Negative unless checked)  Constitutional: [] Weight loss  [] Fever  [] Chills Cardiac: [] Chest pain   [] Chest pressure   [] Palpitations   [] Shortness of breath when laying flat   [] Shortness of breath at rest   [] Shortness of breath with exertion. Vascular:  [] Pain in legs with  walking   [] Pain in legs at rest   [] Pain in legs when laying flat   [] Claudication   [] Pain in feet when walking  [] Pain in feet at rest  [] Pain in feet when laying flat   [] History of DVT   [] Phlebitis   [x] Swelling in legs   [x] Varicose veins   [] Non-healing ulcers Pulmonary:   [] Uses home oxygen   [] Productive cough   [] Hemoptysis   [] Wheeze  [] COPD   [] Asthma Neurologic:  [] Dizziness  [] Blackouts   [] Seizures   [] History of stroke   [] History of TIA  [] Aphasia   [] Temporary blindness   [] Dysphagia   [] Weakness or numbness in arms   [] Weakness or numbness in legs Musculoskeletal:  [x] Arthritis   [] Joint swelling   [] Joint pain   [] Low back pain Hematologic:  [] Easy bruising  [] Easy bleeding    [] Hypercoagulable state   [] Anemic  [] Hepatitis Gastrointestinal:  [] Blood in stool   [] Vomiting blood  [] Gastroesophageal reflux/heartburn   [] Abdominal pain Genitourinary:  [] Chronic kidney disease   [] Difficult urination  [] Frequent urination  [] Burning with urination   [] Hematuria Skin:  [] Rashes   [] Ulcers   [] Wounds Psychological:  [] History of anxiety   []  History of major depression.    Physical Exam BP (!) 182/84 (BP Location: Right Arm)   Pulse 60   Resp 16   Ht 5\' 2"  (1.575 m)   Wt 126 lb (57.2 kg)   BMI 23.05 kg/m  Gen:  WD/WN, NAD.  Appears younger than stated age Head: [place]/AT, No temporalis wasting.  Ear/Nose/Throat: Hearing grossly intact Eyes: Sclera non-icteric. Conjunctiva clear Neck: Supple. Trachea midline Pulmonary:  Good air movement, no use of accessory muscles, respirations not labored.  Cardiac: RRR, No JVD Vascular: Varicosities diffuse and measuring up to 2-3 mm in the right lower extremity        Varicosities extensive and measuring up to 3 mm in the left lower extremity Vessel Right Left  Radial Palpable Palpable                          PT  1+ palpable  1+ palpable  DP Palpable Palpable   Gastrointestinal: soft, non-tender/non-distended.  Musculoskeletal: M/S 5/5 throughout.   Trace bilateral LE edema Neurologic: Sensation grossly intact in extremities.  Symmetrical.  Speech is fluent.  Psychiatric: Judgment intact, Mood & affect appropriate for pt's clinical situation. Dermatologic: No rashes or ulcers noted.  No cellulitis or open wounds.    Radiology No results found.  Labs Recent Results (from the past 2160 hour(s))  Urinalysis, Complete w Microscopic     Status: Abnormal   Collection Time: 10/03/18 10:33 AM  Result Value Ref Range   Color, Urine YELLOW (A) YELLOW   APPearance CLEAR (A) CLEAR   Specific Gravity, Urine 1.018 1.005 - 1.030   pH 5.0 5.0 - 8.0   Glucose, UA NEGATIVE NEGATIVE mg/dL   Hgb urine dipstick MODERATE (A)  NEGATIVE   Bilirubin Urine NEGATIVE NEGATIVE   Ketones, ur NEGATIVE NEGATIVE mg/dL   Protein, ur 30 (A) NEGATIVE mg/dL   Nitrite NEGATIVE NEGATIVE   Leukocytes, UA TRACE (A) NEGATIVE   RBC / HPF 6-10 0 - 5 RBC/hpf   WBC, UA 0-5 0 - 5 WBC/hpf   Bacteria, UA NONE SEEN NONE SEEN   Squamous Epithelial / LPF 0-5 0 - 5   Mucus PRESENT     Comment: Performed at [facility], [address] [place],  [place] [address]  CBC     Status: Abnormal   Collection Time: 10/03/18 10:33 AM  Result Value Ref Range   WBC 2.9 (L) 4.0 - 10.5 K/uL   RBC 3.85 (L) 3.87 - 5.11 MIL/uL   Hemoglobin 11.3 (L) 12.0 - 15.0 g/dL   HCT 36.2 36.0 - 46.0 %   MCV 94.0 80.0 - 100.0 fL   MCH 29.4 26.0 - 34.0 pg   MCHC 31.2 30.0 - 36.0 g/dL   RDW 13.8 11.5 - 15.5 %   Platelets 127 (L) 150 - 400 K/uL   nRBC 0.0 0.0 - 0.2 %    Comment: Performed at [facility], [address] [place], [place] [address]  Troponin I - ONCE - STAT     Status: None   Collection Time: 10/03/18 10:33 AM  Result Value Ref Range   Troponin I <0.03 <0.03 ng/mL    Comment: Performed at [facility], [address] [place], [place] [address]  Comprehensive metabolic panel     Status: Abnormal   Collection Time: 10/03/18 10:33 AM  Result Value Ref Range   Sodium 140 135 - 145 mmol/L   Potassium 3.9 3.5 - 5.1 mmol/L   Chloride 109 98 - 111 mmol/L   CO2 26 22 - 32 mmol/L   Glucose, Bld 108 (H) 70 - 99 mg/dL   BUN 18 8 - 23 mg/dL   Creatinine, Ser 1.07 (H) 0.44 - 1.00 mg/dL   Calcium 9.0 8.9 - 10.3 mg/dL   Total Protein 7.0 6.5 - 8.1 g/dL   Albumin 3.9 3.5 - 5.0 g/dL   AST 18 15 - 41 U/L   ALT 12 0 - 44 U/L   Alkaline Phosphatase 58 38 - 126 U/L   Total Bilirubin 0.7 0.3 - 1.2 mg/dL   GFR calc non Af Amer 44 (L) >60 mL/min   GFR calc Af Amer 51 (L) >60 mL/min   Anion gap 5 5 - 15    Comment: Performed at [facility], [address] [place], [place] [address]  TSH     Status: Abnormal    Collection Time: 10/16/18  2:53 PM  Result Value Ref Range   TSH 7.76 (H) 0.40 - 4.50 mIU/L  COMPLETE METABOLIC PANEL WITH GFR     Status: Abnormal   Collection Time: 11/22/18 10:39 AM  Result Value Ref Range   Glucose, Bld 94 65 - 99 mg/dL    Comment: .            Fasting reference interval .    BUN 22 7 - 25 mg/dL   Creat 1.19 (H) 0.60 - 0.88 mg/dL    Comment: For patients >63 years of age, the reference limit for Creatinine is approximately 13% higher for people identified as African-American. .    GFR, Est Non African American 39 (L) > OR = 60 mL/min/1.58m2   GFR, Est African American 45 (L) > OR = 60 mL/min/1.64m2   BUN/Creatinine Ratio 18 6 - 22 (calc)   Sodium 142 135 - 146 mmol/L   Potassium 4.5 3.5 - 5.3 mmol/L   Chloride 106 98 - 110 mmol/L   CO2 28 20 - 32 mmol/L   Calcium 9.5 8.6 - 10.4 mg/dL   Total Protein 7.0 6.1 - 8.1 g/dL   Albumin 4.1 3.6 - 5.1 g/dL   Globulin 2.9 1.9 - 3.7 g/dL (calc)   AG Ratio 1.4 1.0 - 2.5 (calc)   Total Bilirubin 0.5  0.2 - 1.2 mg/dL   Alkaline phosphatase (APISO) 57 37 - 153 U/L   AST 16 10 - 35 U/L   ALT 8 6 - 29 U/L  CBC with Differential/Platelet     Status: Abnormal   Collection Time: 11/22/18 10:39 AM  Result Value Ref Range   WBC 2.5 (L) 3.8 - 10.8 Thousand/uL   RBC 3.98 3.80 - 5.10 Million/uL   Hemoglobin 11.8 11.7 - 15.5 g/dL   HCT 36.2 35.0 - 45.0 %   MCV 91.0 80.0 - 100.0 fL   MCH 29.6 27.0 - 33.0 pg   MCHC 32.6 32.0 - 36.0 g/dL   RDW 12.5 11.0 - 15.0 %   Platelets 142 140 - 400 Thousand/uL   MPV 10.9 7.5 - 12.5 fL   Neutro Abs 1,515 1,500 - 7,800 cells/uL   Lymphs Abs 793 (L) 850 - 3,900 cells/uL   Absolute Monocytes 163 (L) 200 - 950 cells/uL   Eosinophils Absolute 10 (L) 15 - 500 cells/uL   Basophils Absolute 20 0 - 200 cells/uL   Neutrophils Relative % 60.6 %   Total Lymphocyte 31.7 %   Monocytes Relative 6.5 %   Eosinophils Relative 0.4 %   Basophils Relative 0.8 %  TSH     Status: Abnormal   Collection  Time: 11/22/18 10:39 AM  Result Value Ref Range   TSH 17.04 (H) 0.40 - 4.50 mIU/L  Parathyroid hormone, intact (no Ca)     Status: Abnormal   Collection Time: 11/22/18 10:39 AM  Result Value Ref Range   PTH 91 (H) 14 - 64 pg/mL    Comment: . Interpretive Guide    Intact PTH           Calcium ------------------    ----------           ------- Normal Parathyroid    Normal               Normal Hypoparathyroidism    Low or Low Normal    Low Hyperparathyroidism    Primary            Normal or High       High    Secondary          High                 Normal or Low    Tertiary           High                 High Non-Parathyroid    Hypercalcemia      Low or Low Normal    High .     Assessment/Plan:  No problem-specific Assessment & Plan notes found for this encounter.    The patient has symptoms consistent with chronic venous insufficiency. We discussed the natural history and treatment options for venous disease. I recommended the regular use of 20 - 30 mm Hg compression stockings, and prescribed these today. I recommended leg elevation and anti-inflammatories as needed for pain. I have also recommended a complete venous duplex to assess the venous system for reflux or thrombotic issues. This can be done at the patient's convenience. I will see the patient back after the duplex to assess the response to conservative management, and determine further treatment options.     Leotis Pain 12/04/2018, 3:36 PM   This note was created with Dragon medical transcription system.  Any errors from dictation are unintentional.

## 2018-12-04 NOTE — Assessment & Plan Note (Signed)
Can definitely lower extremity swelling

## 2018-12-04 NOTE — Patient Instructions (Signed)

## 2018-12-12 ENCOUNTER — Encounter (INDEPENDENT_AMBULATORY_CARE_PROVIDER_SITE_OTHER): Payer: Self-pay | Admitting: Nurse Practitioner

## 2018-12-12 ENCOUNTER — Ambulatory Visit (INDEPENDENT_AMBULATORY_CARE_PROVIDER_SITE_OTHER): Payer: Medicare Other

## 2018-12-12 ENCOUNTER — Ambulatory Visit (INDEPENDENT_AMBULATORY_CARE_PROVIDER_SITE_OTHER): Payer: Medicare Other | Admitting: Nurse Practitioner

## 2018-12-12 VITALS — BP 192/86 | HR 61 | Resp 15 | Ht 62.0 in | Wt 124.6 lb

## 2018-12-12 DIAGNOSIS — Z87891 Personal history of nicotine dependence: Secondary | ICD-10-CM | POA: Diagnosis not present

## 2018-12-12 DIAGNOSIS — E782 Mixed hyperlipidemia: Secondary | ICD-10-CM | POA: Diagnosis not present

## 2018-12-12 DIAGNOSIS — I82412 Acute embolism and thrombosis of left femoral vein: Secondary | ICD-10-CM

## 2018-12-12 DIAGNOSIS — I83813 Varicose veins of bilateral lower extremities with pain: Secondary | ICD-10-CM | POA: Diagnosis not present

## 2018-12-12 NOTE — Progress Notes (Signed)
SUBJECTIVE:  Patient ID: Lori Pacheco, female    DOB: 05-Dec-1920, 85 y.o.   MRN: [id number] Chief Complaint  Patient presents with  . Follow-up    ultrasound follow up    HPI  Lori Pacheco is a 85 y.o. female Patient is seen for evaluation of leg swelling. The patient first noticed the swelling remotely but is now concerned because of a significant increase in the overall edema. The swelling is associated with pain and discoloration. The patient notes that in the morning the legs are significantly improved but they steadily worsened throughout the course of the day. Elevation makes the legs better, dependency makes them much worse.   sHe notices that it is much worse at night when she is sitting dependent and she falls asleep watching TV.  There is no history of ulcerations associated with the swelling.   The patient denies any recent changes in their medications.  The patient has not been wearing graduated compression.  The patient has no had any past angiography, interventions or vascular surgery.  The patient denies a history of DVT or PE. There is no prior history of phlebitis. There is no history of primary lymphedema.  There is no history of radiation treatment to the groin or pelvis No history of malignancies. No history of trauma or groin or pelvic surgery. No history of foreign travel or parasitic infections area   She underwent a bilateral lower extremity venous reflux study which revealed evidence of chronically occlusive thrombus in the right great saphenous vein with a partially occlusive chronic thrombus of the proximal thigh and anterior lateral mid distal thigh varicosities.  The left lower extremity has a partial chronic thrombus of the great saphenous vein from the proximal knee to the proximal calf.  There was also evidence of reflux in the popliteal vein bilaterally.  No evidence of deep venous thrombosis. Past Medical History:  Diagnosis Date  .  Hyperlipidemia   . Hypertension   . Thyroid disease     Past Surgical History:  Procedure Laterality Date  . NO PAST SURGERIES      Social History   Socioeconomic History  . Marital status: Widowed    Spouse name: Jeneen Rinks  . Number of children: 66  . Years of education: Not on file  . Highest education level: 3rd grade  Occupational History  . Occupation: Retired  Scientific laboratory technician  . Financial resource strain: Not hard at all  . Food insecurity:    Worry: Never true    Inability: Never true  . Transportation needs:    Medical: No    Non-medical: No  Tobacco Use  . Smoking status: Never Smoker  . Smokeless tobacco: Former Systems developer    Types: Snuff  . Tobacco comment: smoking cessation materials not required  Substance and Sexual Activity  . Alcohol use: No    Alcohol/week: 0.0 standard drinks  . Drug use: No  . Sexual activity: Not Currently  Lifestyle  . Physical activity:    Days per week: 7 days    Minutes per session: 60 min  . Stress: Not at all  Relationships  . Social connections:    Talks on phone: Twice a week    Gets together: Twice a week    Attends religious service: More than 4 times per year    Active member of club or organization: No    Attends meetings of clubs or organizations: Never    Relationship status: Widowed  . Intimate  partner violence:    Fear of current or ex partner: No    Emotionally abused: No    Physically abused: No    Forced sexual activity: No  Other Topics Concern  . Not on file  Social History Narrative        Family History  Problem Relation Age of Onset  . Healthy Mother   . Healthy Father   . Heart disease Brother   . Cancer Brother   . Cancer Daughter   . Alcohol abuse Son   . Diabetes Son   . Heart disease Son     No Known Allergies   Review of Systems   Review of Systems: Negative Unless Checked Constitutional: [] Weight loss  [] Fever  [] Chills Cardiac: [] Chest pain   []  Atrial Fibrillation  [] Palpitations    [] Shortness of breath when laying flat   [] Shortness of breath with exertion. [] Shortness of breath at rest Vascular:  [] Pain in legs with walking   [] Pain in legs with standing [] Pain in legs when laying flat   [] Claudication    [] Pain in feet when laying flat    [] History of DVT   [] Phlebitis   [x] Swelling in legs   [x] Varicose veins   [] Non-healing ulcers Pulmonary:   [] Uses home oxygen   [] Productive cough   [] Hemoptysis   [] Wheeze  [] COPD   [] Asthma Neurologic:  [] Dizziness   [] Seizures  [] Blackouts [] History of stroke   [] History of TIA  [] Aphasia   [] Temporary Blindness   [] Weakness or numbness in arm   [x] Weakness or numbness in leg Musculoskeletal:   [] Joint swelling   [] Joint pain   [] Low back pain  []  History of Knee Replacement [] Arthritis [] back Surgeries  []  Spinal Stenosis    Hematologic:  [] Easy bruising  [] Easy bleeding   [] Hypercoagulable state   [x] Anemic Gastrointestinal:  [] Diarrhea   [] Vomiting  [] Gastroesophageal reflux/heartburn   [] Difficulty swallowing. [] Abdominal pain Genitourinary:  [x] Chronic kidney disease   [] Difficult urination  [] Anuric   [] Blood in urine [] Frequent urination  [] Burning with urination   [] Hematuria Skin:  [] Rashes   [] Ulcers [] Wounds Psychological:  [] History of anxiety   []  History of major depression  []  Memory Difficulties      OBJECTIVE:   Physical Exam  BP (!) 192/86 (BP Location: Left Arm)   Pulse 61   Resp 15   Ht 5\' 2"  (1.575 m)   Wt 124 lb 9.6 oz (56.5 kg)   BMI 22.79 kg/m   Gen: WD/WN, NAD Head: [place]/AT, No temporalis wasting.  Ear/Nose/Throat: Hearing grossly intact, nares w/o erythema or drainage Eyes: PER, EOMI, sclera nonicteric.  Neck: Supple, no masses.  No JVD.  Pulmonary:  Good air movement, no use of accessory muscles.  Cardiac: RRR Vascular: 1+ soft edema. Scattered varicosities.  Vessel Right Left  Radial Palpable Palpable  Dorsalis Pedis Palpable Palpable  Posterior Tibial Palpable Palpable   Gastrointestinal:  soft, non-distended. No guarding/no peritoneal signs.  Musculoskeletal: M/S 5/5 throughout.  No deformity or atrophy.  Neurologic: Pain and light touch intact in extremities.  Symmetrical.  Speech is fluent. Motor exam as listed above. Psychiatric: Judgment intact, Mood & affect appropriate for pt's clinical situation. Dermatologic: No Venous rashes. No Ulcers Noted.  No changes consistent with cellulitis. Lymph : No Cervical lymphadenopathy, no lichenification or skin changes of chronic lymphedema.       ASSESSMENT AND PLAN:  1. Varicose veins of leg with pain, bilateral No surgery or intervention at this point in time.  I have had a long discussion with the patient regarding venous insufficiency and why it  causes symptoms. I have discussed with the patient the chronic skin changes that accompany venous insufficiency and the long term sequela such as infection and ulceration.  Patient will begin wearing graduated compression stockings class 1 (20-30 mmHg) or compression wraps on a daily basis a prescription was given. The patient will put the stockings on first thing in the morning and removing them in the evening. The patient is instructed specifically not to sleep in the stockings.    In addition, behavioral modification including several periods of elevation of the lower extremities during the day will be continued. I have demonstrated that proper elevation is a position with the ankles at heart level.  The patient is instructed to begin routine exercise, especially walking on a daily basis  Patient will follow up in 6 months to reassess the degree of swelling and the control that graduated compression stockings or compression wraps  is offering.   The patient can be assessed for a Lymph Pump at that time  2. Deep vein thrombosis (DVT) of femoral vein of left lower extremity, unspecified chronicity (HCC) DVTs at this time are in the chronic phase.  Postphlebitic syndrome is likely  contributing to her swelling somewhat.  Discussed conservative methods of controlling swelling as outlined above.  3. Mixed hyperlipidemia Continue statin as ordered and reviewed, no changes at this time    Current Outpatient Medications on File Prior to Visit  Medication Sig Dispense Refill  . acetaminophen (TYLENOL) 500 MG tablet Take 1 tablet (500 mg total) by mouth every 6 (six) hours as needed. 100 tablet 0  . amLODipine (NORVASC) 5 MG tablet Take 1 tablet (5 mg total) by mouth daily. 30 tablet 5  . hydrALAZINE (APRESOLINE) 10 MG tablet Take 1 tablet (10 mg total) by mouth every 6 (six) hours as needed. IF BP IS ABOVE 160/90 30 tablet 1  . hydrOXYzine (ATARAX/VISTARIL) 10 MG tablet Take 1 tablet (10 mg total) by mouth 3 (three) times daily as needed for itching or anxiety. 90 tablet 0  . levothyroxine (SYNTHROID, LEVOTHROID) 100 MCG tablet TAKE 1 TABLET BY MOUTH ONCE DAILY (NEW  DOSE  REPEAT  LEVELS  IN  SIX  WEEKS) 30 tablet 0  . lisinopril (PRINIVIL,ZESTRIL) 20 MG tablet Take 1 tablet (20 mg total) by mouth daily. 30 tablet 5  . Vitamin D, Ergocalciferol, (DRISDOL) 50000 units CAPS capsule Take 1 capsule (50,000 Units total) by mouth once a week. 12 capsule 0   No current facility-administered medications on file prior to visit.     There are no Patient Instructions on file for this visit. No follow-ups on file.   Kris Hartmann, NP  This note was completed with Sales executive.  Any errors are purely unintentional.

## 2018-12-25 ENCOUNTER — Ambulatory Visit (INDEPENDENT_AMBULATORY_CARE_PROVIDER_SITE_OTHER): Payer: Medicare Other

## 2018-12-25 ENCOUNTER — Encounter: Payer: Self-pay | Admitting: Family Medicine

## 2018-12-25 ENCOUNTER — Ambulatory Visit: Payer: Medicare Other | Admitting: Family Medicine

## 2018-12-25 VITALS — BP 160/82 | HR 61 | Temp 98.0°F | Resp 16 | Ht 62.0 in | Wt 126.5 lb

## 2018-12-25 VITALS — BP 156/82 | HR 61 | Temp 98.0°F | Resp 16 | Ht 62.0 in | Wt 126.5 lb

## 2018-12-25 DIAGNOSIS — F09 Unspecified mental disorder due to known physiological condition: Secondary | ICD-10-CM

## 2018-12-25 DIAGNOSIS — F320 Major depressive disorder, single episode, mild: Secondary | ICD-10-CM

## 2018-12-25 DIAGNOSIS — Z0000 Encounter for general adult medical examination without abnormal findings: Secondary | ICD-10-CM | POA: Diagnosis not present

## 2018-12-25 DIAGNOSIS — E039 Hypothyroidism, unspecified: Secondary | ICD-10-CM

## 2018-12-25 DIAGNOSIS — I10 Essential (primary) hypertension: Secondary | ICD-10-CM | POA: Diagnosis not present

## 2018-12-25 DIAGNOSIS — I129 Hypertensive chronic kidney disease with stage 1 through stage 4 chronic kidney disease, or unspecified chronic kidney disease: Secondary | ICD-10-CM

## 2018-12-25 DIAGNOSIS — N183 Chronic kidney disease, stage 3 unspecified: Secondary | ICD-10-CM

## 2018-12-25 DIAGNOSIS — I700 Atherosclerosis of aorta: Secondary | ICD-10-CM

## 2018-12-25 DIAGNOSIS — D61818 Other pancytopenia: Secondary | ICD-10-CM

## 2018-12-25 MED ORDER — AMLODIPINE BESYLATE 5 MG PO TABS: 5.0000 mg | ORAL_TABLET | Freq: Every day | ORAL | 5 refills | Status: DC

## 2018-12-25 MED ORDER — LISINOPRIL 20 MG PO TABS: 20.0000 mg | ORAL_TABLET | Freq: Every day | ORAL | 5 refills | Status: DC

## 2018-12-25 NOTE — Progress Notes (Signed)
Name: Lori Pacheco   MRN: [id number]    DOB: [birth year]   Date:12/25/2018       Progress Note  Subjective  Chief Complaint  Chief Complaint  Patient presents with  . Follow-up    1 month F/U  . Varicose Veins    Seen Vein and Vascular Doctor-US on her legs came back good-blood clots dissolved themselves and told her to wear compression stockings     HPI  She came in today with her daughter  -  [name]  Patient is a poor historian, but was in good spirits and cooperative with no complaints today    Epigastric pain: she denies pain today   Anxiety/Depression: she states she is not sad right now, but feels nervous inside, feels like little worms are crawling under her skin, intermittent, she states improves when she uses rubbing alcohol. She is willing to try prn medication such as hydroxyzine. She does not like taking medication daily and did not agree on taking SSRI in the past. She still has some itching but states doing much better, daughter states no longer agitated with prn medication  HTN: bp is still high, but it has improved, daughter states at home not as high, she was rushing to get here today, advised to take hydroxyzine prn if bp spikes, daughter states she gets dizzy when bp is towards low normal range. We will continue to monitor for now.   History of chronic DVT left: stable and no pain, not on blood thinners, high risk of fall  Right leg varicose veins: seen by vascular surgeon and advised compression stocking hoses and leg elevation no problems since  Hypothyroidism: she takes medications on and off, we will recheck TSH today, last level was still elevated  Cognitive dysfunction, failed CIT again  CKI stage III: review recent labs, she has normal urine output, she has pruritus but seems to be related to anxiety . She is on ACE   Leucopenia: recheck labs, seen by hematologist in the past, but refuses further evaluation. Unchanged    Patient Active Problem  List   Diagnosis Date Noted  . Varicose veins of leg with pain, bilateral 12/04/2018  . Deep vein thrombosis (DVT) of femoral vein of left lower extremity ([place]) 10/17/2018  . Other constipation 04/23/2018  . Leg cramps 04/23/2018  . Secondary renal hyperparathyroidism ([place]) 05/15/2017  . Atherosclerosis of abdominal aorta ([place]) 11/10/2016  . Perennial allergic rhinitis with seasonal variation 11/10/2016  . Vitamin D deficiency 03/29/2016  . Hypothyroidism 09/18/2015  . Essential hypertension 05/19/2015  . Hyperlipemia 05/19/2015  . Chronic kidney disease, stage IV (severe) ([place]) 05/19/2015    Past Surgical History:  Procedure Laterality Date  . NO PAST SURGERIES      Family History  Problem Relation Age of Onset  . Healthy Mother   . Healthy Father   . Heart disease Brother   . Cancer Brother   . Cancer Daughter   . Alcohol abuse Son   . Diabetes Son   . Heart disease Son     Social History   Socioeconomic History  . Marital status: Widowed    Spouse name: Jeneen Rinks  . Number of children: 35  . Years of education: Not on file  . Highest education level: 3rd grade  Occupational History  . Occupation: Retired  Scientific laboratory technician  . Financial resource strain: Not hard at all  . Food insecurity:    Worry: Never true    Inability: Never true  .  Transportation needs:    Medical: No    Non-medical: No  Tobacco Use  . Smoking status: Never Smoker  . Smokeless tobacco: Former Systems developer    Types: Snuff  . Tobacco comment: smoking cessation materials not required  Substance and Sexual Activity  . Alcohol use: No    Alcohol/week: 0.0 standard drinks  . Drug use: No  . Sexual activity: Not Currently  Lifestyle  . Physical activity:    Days per week: 7 days    Minutes per session: 60 min  . Stress: To some extent  Relationships  . Social connections:    Talks on phone: More than three times a week    Gets together: Twice a week    Attends religious service: More than 4 times  per year    Active member of club or organization: No    Attends meetings of clubs or organizations: Never    Relationship status: Widowed  . Intimate partner violence:    Fear of current or ex partner: No    Emotionally abused: No    Physically abused: No    Forced sexual activity: No  Other Topics Concern  . Not on file  Social History Narrative         Current Outpatient Medications:  .  acetaminophen (TYLENOL) 500 MG tablet, Take 1 tablet (500 mg total) by mouth every 6 (six) hours as needed., Disp: 100 tablet, Rfl: 0 .  amLODipine (NORVASC) 5 MG tablet, Take 1 tablet (5 mg total) by mouth daily., Disp: 30 tablet, Rfl: 5 .  hydrALAZINE (APRESOLINE) 10 MG tablet, Take 1 tablet (10 mg total) by mouth every 6 (six) hours as needed. IF BP IS ABOVE 160/90 (Patient not taking: Reported on 12/25/2018), Disp: 30 tablet, Rfl: 1 .  hydrOXYzine (ATARAX/VISTARIL) 10 MG tablet, Take 1 tablet (10 mg total) by mouth 3 (three) times daily as needed for itching or anxiety., Disp: 90 tablet, Rfl: 0 .  levothyroxine (SYNTHROID, LEVOTHROID) 100 MCG tablet, TAKE 1 TABLET BY MOUTH ONCE DAILY (NEW  DOSE  REPEAT  LEVELS  IN  SIX  WEEKS), Disp: 30 tablet, Rfl: 0 .  lisinopril (PRINIVIL,ZESTRIL) 20 MG tablet, Take 1 tablet (20 mg total) by mouth daily., Disp: 30 tablet, Rfl: 5 .  Vitamin D, Ergocalciferol, (DRISDOL) 50000 units CAPS capsule, Take 1 capsule (50,000 Units total) by mouth once a week., Disp: 12 capsule, Rfl: 0  No Known Allergies  I personally reviewed active problem list, medication list, allergies, family history, social history with the patient/caregiver today.   ROS  Constitutional: Negative for fever or weight change.  Respiratory: Negative for cough and shortness of breath.   Cardiovascular: Negative for chest pain or palpitations.  Gastrointestinal: Negative for abdominal pain, no bowel changes.  Musculoskeletal: Negative for gait problem or joint swelling.  Skin: Negative for  rash.  Neurological: Negative for dizziness or headache.  No other specific complaints in a complete review of systems (except as listed in HPI above).  Objective  Vitals:   12/25/18 1541 12/25/18 1547  BP: (!) 156/82 (!) 160/82  Pulse: 61   Resp: 16   Temp: 98 F (36.7 C)   TempSrc: Oral   SpO2: 99%   Weight: 126 lb 8 oz (57.4 kg)   Height: 5\' 2"  (1.575 m)     Body mass index is 23.14 kg/m.  Physical Exam  Constitutional: Patient appears well-developed and well-nourished. No distress.  HEENT: head atraumatic, normocephalic, pupils equal and reactive to  light, neck supple, throat within normal limits Cardiovascular: Normal rate, regular rhythm and normal heart sounds.  No murmur heard. No BLE edema. Pulmonary/Chest: Effort normal and breath sounds normal. No respiratory distress. Abdominal: Soft.  There is no tenderness.  Muscular Skeletal: walks slowly,  Antalgic gait with first steps but better once she got a good rhythm.  Psychiatric: Patient has a normal mood and affect. behavior is normal. Judgment and thought content normal.  Recent Results (from the past 2160 hour(s))  Urinalysis, Complete w Microscopic     Status: Abnormal   Collection Time: 10/03/18 10:33 AM  Result Value Ref Range   Color, Urine YELLOW (A) YELLOW   APPearance CLEAR (A) CLEAR   Specific Gravity, Urine 1.018 1.005 - 1.030   pH 5.0 5.0 - 8.0   Glucose, UA NEGATIVE NEGATIVE mg/dL   Hgb urine dipstick MODERATE (A) NEGATIVE   Bilirubin Urine NEGATIVE NEGATIVE   Ketones, ur NEGATIVE NEGATIVE mg/dL   Protein, ur 30 (A) NEGATIVE mg/dL   Nitrite NEGATIVE NEGATIVE   Leukocytes, UA TRACE (A) NEGATIVE   RBC / HPF 6-10 0 - 5 RBC/hpf   WBC, UA 0-5 0 - 5 WBC/hpf   Bacteria, UA NONE SEEN NONE SEEN   Squamous Epithelial / LPF 0-5 0 - 5   Mucus PRESENT     Comment: Performed at [facility], [address] [place], [place] [address]  CBC     Status: Abnormal   Collection Time: 10/03/18 10:33 AM   Result Value Ref Range   WBC 2.9 (L) 4.0 - 10.5 K/uL   RBC 3.85 (L) 3.87 - 5.11 MIL/uL   Hemoglobin 11.3 (L) 12.0 - 15.0 g/dL   HCT 36.2 36.0 - 46.0 %   MCV 94.0 80.0 - 100.0 fL   MCH 29.4 26.0 - 34.0 pg   MCHC 31.2 30.0 - 36.0 g/dL   RDW 13.8 11.5 - 15.5 %   Platelets 127 (L) 150 - 400 K/uL   nRBC 0.0 0.0 - 0.2 %    Comment: Performed at [facility], [address] [place], [place] [address]  Troponin I - ONCE - STAT     Status: None   Collection Time: 10/03/18 10:33 AM  Result Value Ref Range   Troponin I <0.03 <0.03 ng/mL    Comment: Performed at [facility], [address] [place], [place] [address]  Comprehensive metabolic panel     Status: Abnormal   Collection Time: 10/03/18 10:33 AM  Result Value Ref Range   Sodium 140 135 - 145 mmol/L   Potassium 3.9 3.5 - 5.1 mmol/L   Chloride 109 98 - 111 mmol/L   CO2 26 22 - 32 mmol/L   Glucose, Bld 108 (H) 70 - 99 mg/dL   BUN 18 8 - 23 mg/dL   Creatinine, Ser 1.07 (H) 0.44 - 1.00 mg/dL   Calcium 9.0 8.9 - 10.3 mg/dL   Total Protein 7.0 6.5 - 8.1 g/dL   Albumin 3.9 3.5 - 5.0 g/dL   AST 18 15 - 41 U/L   ALT 12 0 - 44 U/L   Alkaline Phosphatase 58 38 - 126 U/L   Total Bilirubin 0.7 0.3 - 1.2 mg/dL   GFR calc non Af Amer 44 (L) >60 mL/min   GFR calc Af Amer 51 (L) >60 mL/min   Anion gap 5 5 - 15    Comment: Performed at [facility], [address], [place], [place] [address]  TSH  Status: Abnormal   Collection Time: 10/16/18  2:53 PM  Result Value Ref Range   TSH 7.76 (H) 0.40 - 4.50 mIU/L  COMPLETE METABOLIC PANEL WITH GFR     Status: Abnormal   Collection Time: 11/22/18 10:39 AM  Result Value Ref Range   Glucose, Bld 94 65 - 99 mg/dL    Comment: .            Fasting reference interval .    BUN 22 7 - 25 mg/dL   Creat 1.19 (H) 0.60 - 0.88 mg/dL    Comment: For patients >60 years of age, the reference limit for Creatinine is approximately 13% higher for people identified as  African-American. .    GFR, Est Non African American 39 (L) > OR = 60 mL/min/1.38m2   GFR, Est African American 45 (L) > OR = 60 mL/min/1.42m2   BUN/Creatinine Ratio 18 6 - 22 (calc)   Sodium 142 135 - 146 mmol/L   Potassium 4.5 3.5 - 5.3 mmol/L   Chloride 106 98 - 110 mmol/L   CO2 28 20 - 32 mmol/L   Calcium 9.5 8.6 - 10.4 mg/dL   Total Protein 7.0 6.1 - 8.1 g/dL   Albumin 4.1 3.6 - 5.1 g/dL   Globulin 2.9 1.9 - 3.7 g/dL (calc)   AG Ratio 1.4 1.0 - 2.5 (calc)   Total Bilirubin 0.5 0.2 - 1.2 mg/dL   Alkaline phosphatase (APISO) 57 37 - 153 U/L   AST 16 10 - 35 U/L   ALT 8 6 - 29 U/L  CBC with Differential/Platelet     Status: Abnormal   Collection Time: 11/22/18 10:39 AM  Result Value Ref Range   WBC 2.5 (L) 3.8 - 10.8 Thousand/uL   RBC 3.98 3.80 - 5.10 Million/uL   Hemoglobin 11.8 11.7 - 15.5 g/dL   HCT 36.2 35.0 - 45.0 %   MCV 91.0 80.0 - 100.0 fL   MCH 29.6 27.0 - 33.0 pg   MCHC 32.6 32.0 - 36.0 g/dL   RDW 12.5 11.0 - 15.0 %   Platelets 142 140 - 400 Thousand/uL   MPV 10.9 7.5 - 12.5 fL   Neutro Abs 1,515 1,500 - 7,800 cells/uL   Lymphs Abs 793 (L) 850 - 3,900 cells/uL   Absolute Monocytes 163 (L) 200 - 950 cells/uL   Eosinophils Absolute 10 (L) 15 - 500 cells/uL   Basophils Absolute 20 0 - 200 cells/uL   Neutrophils Relative % 60.6 %   Total Lymphocyte 31.7 %   Monocytes Relative 6.5 %   Eosinophils Relative 0.4 %   Basophils Relative 0.8 %  TSH     Status: Abnormal   Collection Time: 11/22/18 10:39 AM  Result Value Ref Range   TSH 17.04 (H) 0.40 - 4.50 mIU/L  Parathyroid hormone, intact (no Ca)     Status: Abnormal   Collection Time: 11/22/18 10:39 AM  Result Value Ref Range   PTH 91 (H) 14 - 64 pg/mL    Comment: . Interpretive Guide    Intact PTH           Calcium ------------------    ----------           ------- Normal Parathyroid    Normal               Normal Hypoparathyroidism    Low or Low Normal    Low Hyperparathyroidism    Primary  Normal or High       High    Secondary          High                 Normal or Low    Tertiary           High                 High Non-Parathyroid    Hypercalcemia      Low or Low Normal    High .      PHQ2/9: Depression screen [facility] 2/9 12/25/2018 12/25/2018 11/22/2018 10/16/2018 07/23/2018  Decreased Interest 0 0 0 0 0  Down, Depressed, Hopeless 0 0 0 1 2  PHQ - 2 Score 0 0 0 1 2  Altered sleeping 0 - - - 2  Tired, decreased energy 0 - - - 0  Change in appetite 0 - - - 1  Feeling bad or failure about yourself  0 - - - 0  Trouble concentrating 0 - - - 2  Moving slowly or fidgety/restless 0 - - - 0  Suicidal thoughts 0 - - - 0  PHQ-9 Score 0 - - - 7  Difficult doing work/chores Not difficult at all - - - Not difficult at all     Fall Risk: Fall Risk  12/25/2018 11/22/2018 10/16/2018 07/23/2018 05/22/2018  Falls in the past year? 1 1 1  No No  Comment - - - - -  Number falls in past yr: 1 1 1  - -  Injury with Fall? 0 0 0 - -  Risk Factor Category  - - - - -  Comment - - - - -  Risk for fall due to : History of fall(s);Impaired balance/gait - Other (Comment) - Impaired balance/gait  Risk for fall due to: Comment - - Gets in a hurry - -  Follow up Falls prevention discussed - - - -     Assessment & Plan   1. Essential hypertension  - amLODipine (NORVASC) 5 MG tablet; Take 1 tablet (5 mg total) by mouth daily.  Dispense: 30 tablet; Refill: 5 - lisinopril (PRINIVIL,ZESTRIL) 20 MG tablet; Take 1 tablet (20 mg total) by mouth daily.  Dispense: 30 tablet; Refill: 5  2. Acquired hypothyroidism  - TSH  3. Chronic kidney disease, stage III (moderate) (HCC)  - lisinopril (PRINIVIL,ZESTRIL) 20 MG tablet; Take 1 tablet (20 mg total) by mouth daily.  Dispense: 30 tablet; Refill: 5  4. Benign hypertension with chronic kidney disease, stage III (HCC)  - amLODipine (NORVASC) 5 MG tablet; Take 1 tablet (5 mg total) by mouth daily.  Dispense: 30 tablet; Refill: 5 - lisinopril  (PRINIVIL,ZESTRIL) 20 MG tablet; Take 1 tablet (20 mg total) by mouth daily.  Dispense: 30 tablet; Refill: 5  5. Pancytopenia ([place])  Does not want to pursue or follow up with hematologist and family agrees   6. Mild major depression ([place])  She seems to be in remission   7. Atherosclerosis of abdominal aorta ([place])  Does not want statin therapy   8. Cognitive dysfunction  Failed CIT she lives youngest daughter, still able to get dressed by herself, eats by herself, does not manage her money ( but gives input)

## 2018-12-25 NOTE — Progress Notes (Signed)
Subjective:   Lori Pacheco is a 85 y.o. female who presents for Medicare Annual (Subsequent) preventive examination.  Review of Systems:   Cardiac Risk Factors include: advanced age (>57men, >105 women);hypertension     Objective:     Vitals: BP (!) 156/82 (BP Location: Right Arm, Patient Position: Sitting, Cuff Size: Normal)   Pulse 61   Temp 98 F (36.7 C) (Oral)   Resp 16   Ht 5\' 2"  (1.575 m)   Wt 126 lb 8 oz (57.4 kg)   SpO2 99%   BMI 23.14 kg/m   Body mass index is 23.14 kg/m.  Advanced Directives 12/25/2018 12/22/2017 07/21/2017 06/26/2017 05/11/2017 03/28/2017 12/19/2016  Does Patient Have a Medical Advance Directive? No No No No No No No  Type of Advance Directive - - - - - - -  Does patient want to make changes to medical advance directive? - - - - - - -  Copy of [place] in Chart? - - - - - - -  Would patient like information on creating a medical advance directive? No - Patient declined Yes (MAU/Ambulatory/Procedural Areas - Information given) Yes (MAU/Ambulatory/Procedural Areas - Information given) - - - Yes (ED - Information included in AVS)    Tobacco Social History   Tobacco Use  Smoking Status Never Smoker  Smokeless Tobacco Former Systems developer  . Types: Snuff  Tobacco Comment   smoking cessation materials not required     Counseling given: Not Answered Comment: smoking cessation materials not required   Clinical Intake:  Pre-visit preparation completed: Yes  Pain : No/denies pain Pain Score: 0-No pain     BMI - recorded: 23.14 Nutritional Status: BMI of 19-24  Normal Nutritional Risks: None Diabetes: No  How often do you need to have someone help you when you read instructions, pamphlets, or other written materials from your doctor or pharmacy?: 1 - Never What is the last grade level you completed in school?: 3rd grade  Interpreter Needed?: No  Information entered by :: Clemetine Marker LPN  Past Medical History:  Diagnosis Date   . Hyperlipidemia   . Hypertension   . Thyroid disease    Past Surgical History:  Procedure Laterality Date  . NO PAST SURGERIES     Family History  Problem Relation Age of Onset  . Healthy Mother   . Healthy Father   . Heart disease Brother   . Cancer Brother   . Cancer Daughter   . Alcohol abuse Son   . Diabetes Son   . Heart disease Son    Social History   Socioeconomic History  . Marital status: Widowed    Spouse name: Jeneen Rinks  . Number of children: 34  . Years of education: Not on file  . Highest education level: 3rd grade  Occupational History  . Occupation: Retired  Scientific laboratory technician  . Financial resource strain: Not hard at all  . Food insecurity:    Worry: Never true    Inability: Never true  . Transportation needs:    Medical: No    Non-medical: No  Tobacco Use  . Smoking status: Never Smoker  . Smokeless tobacco: Former Systems developer    Types: Snuff  . Tobacco comment: smoking cessation materials not required  Substance and Sexual Activity  . Alcohol use: No    Alcohol/week: 0.0 standard drinks  . Drug use: No  . Sexual activity: Not Currently  Lifestyle  . Physical activity:    Days per  week: 7 days    Minutes per session: 60 min  . Stress: To some extent  Relationships  . Social connections:    Talks on phone: More than three times a week    Gets together: Twice a week    Attends religious service: More than 4 times per year    Active member of club or organization: No    Attends meetings of clubs or organizations: Never    Relationship status: Widowed  Other Topics Concern  . Not on file  Social History Narrative        Outpatient Encounter Medications as of 12/25/2018  Medication Sig  . acetaminophen (TYLENOL) 500 MG tablet Take 1 tablet (500 mg total) by mouth every 6 (six) hours as needed.  Marland Kitchen amLODipine (NORVASC) 5 MG tablet Take 1 tablet (5 mg total) by mouth daily.  . hydrOXYzine (ATARAX/VISTARIL) 10 MG tablet Take 1 tablet (10 mg total) by  mouth 3 (three) times daily as needed for itching or anxiety.  Marland Kitchen levothyroxine (SYNTHROID, LEVOTHROID) 100 MCG tablet TAKE 1 TABLET BY MOUTH ONCE DAILY (NEW  DOSE  REPEAT  LEVELS  IN  SIX  WEEKS)  . lisinopril (PRINIVIL,ZESTRIL) 20 MG tablet Take 1 tablet (20 mg total) by mouth daily.  . Vitamin D, Ergocalciferol, (DRISDOL) 50000 units CAPS capsule Take 1 capsule (50,000 Units total) by mouth once a week.  . hydrALAZINE (APRESOLINE) 10 MG tablet Take 1 tablet (10 mg total) by mouth every 6 (six) hours as needed. IF BP IS ABOVE 160/90 (Patient not taking: Reported on 12/25/2018)   No facility-administered encounter medications on file as of 12/25/2018.     Activities of Daily Living In your present state of health, do you have any difficulty performing the following activities: 12/25/2018 11/22/2018  Hearing? N N  Comment declines hearing aids -  Vision? N N  Difficulty concentrating or making decisions? Y N  Walking or climbing stairs? N Y  Dressing or bathing? N N  Doing errands, shopping? Tempie Donning  Preparing Food and eating ? N -  Using the Toilet? N -  In the past six months, have you accidently leaked urine? Y -  Do you have problems with loss of bowel control? N -  Managing your Medications? N -  Managing your Finances? N -  Housekeeping or managing your Housekeeping? N -  Some recent data might be hidden    Patient Care Team: Steele Sizer, MD as PCP - General (Family Medicine) Anthonette Legato, MD as Consulting Physician (Nephrology)    Assessment:   This is a routine wellness examination for [name].  Exercise Activities and Dietary recommendations Current Exercise Habits: Home exercise routine, Time (Minutes): 60, Frequency (Times/Week): 7, Weekly Exercise (Minutes/Week): 420, Intensity: Mild, Exercise limited by: orthopedic condition(s)  Goals    . Patient requires assistance with medicaiton management and adherence      Clinical Goals:   Interventions:      . Patient  Stated     Maintain healthy blood pressure        Fall Risk Fall Risk  12/25/2018 11/22/2018 10/16/2018 07/23/2018 05/22/2018  Falls in the past year? 1 1 1  No No  Comment - - - - -  Number falls in past yr: 1 1 1  - -  Injury with Fall? 0 0 0 - -  Risk Factor Category  - - - - -  Comment - - - - -  Risk for fall due to : History of  fall(s);Impaired balance/gait - Other (Comment) - Impaired balance/gait  Risk for fall due to: Comment - - Gets in a hurry - -  Follow up Falls prevention discussed - - - -   FALL RISK PREVENTION PERTAINING TO THE HOME:  Any stairs in or around the home? Yes  If so, do they handrails? Yes   Home free of loose throw rugs in walkways, pet beds, electrical cords, etc? Yes  Adequate lighting in your home to reduce risk of falls? Yes   ASSISTIVE DEVICES UTILIZED TO PREVENT FALLS:  Life alert? No  Use of a cane, walker or w/c? No  Grab bars in the bathroom? Yes  Shower chair or bench in shower? No  Elevated toilet seat or a handicapped toilet? No   DME ORDERS:  DME order needed?  No   TIMED UP AND GO:  Was the test performed? Yes .  Length of time to ambulate 10 feet: 7 sec.   GAIT:  Appearance of gait: Gait slow, steady and without the use of an assistive device.   Education: Fall risk prevention has been discussed.  Intervention(s) required? No   Depression Screen PHQ 2/9 Scores 12/25/2018 11/22/2018 10/16/2018 07/23/2018  PHQ - 2 Score 0 0 1 2  PHQ- 9 Score - - - 7     Cognitive Function     6CIT Screen 12/25/2018 12/22/2017 12/19/2016  What Year? 4 points 4 points 4 points  What month? 0 points 0 points 0 points  What time? 0 points 0 points 3 points  Count back from 20 4 points 4 points 4 points  Months in reverse 4 points 4 points 4 points  Repeat phrase 10 points 10 points 8 points  Total Score 22 22 23     Immunization History  Administered Date(s) Administered  . Influenza, High Dose Seasonal PF 09/17/2015, 08/22/2016, 06/26/2017,  07/23/2018  . Influenza-Unspecified 09/02/2014  . Pneumococcal Conjugate-13 11/14/2013, 09/27/2016  . Pneumococcal Polysaccharide-23 11/29/2011  . Tdap 07/06/2012  . Zoster 05/29/2008    Qualifies for Shingles Vaccine? No Zostavax completed 2009. Due for Shingrix. Education has been provided regarding the importance of this vaccine. Pt has been advised to call insurance company to determine out of pocket expense. Advised may also receive vaccine at local pharmacy or Health Dept. Verbalized acceptance and understanding.  Tdap: Up to date  Flu Vaccine: Up to date  Pneumococcal Vaccine: Up to date   Screening Tests Health Maintenance  Topic Date Due  . TETANUS/TDAP  07/06/2022  . INFLUENZA VACCINE  Completed  . DEXA SCAN  Completed  . PNA vac Low Risk Adult  Completed   Cancer Screenings:  Colorectal Screening:  No longer required.   Mammogram:No longer required.   Bone Density: Completed 09/02/14. Results unavailable.   Lung Cancer Screening: (Low Dose CT Chest recommended if Age 69-80 years, 30 pack-year currently smoking OR have quit w/in 15years.) does not qualify.     Additional Screening:  Hepatitis C Screening: no longer required.   Vision Screening: Recommended annual ophthalmology exams for early detection of glaucoma and other disorders of the eye. Is the patient up to date with their annual eye exam?  No  Who is the provider or what is the name of the office in which the pt attends annual eye exams? No established provider If pt is not established with a provider, would they like to be referred to a provider to establish care? No .   Dental Screening: Recommended annual dental exams  for proper oral hygiene  Community Resource Referral:  CRR required this visit?  No      Plan:     I have personally reviewed and addressed the Medicare Annual Wellness questionnaire and have noted the following in the patient's chart:  A. Medical and social history B. Use  of alcohol, tobacco or illicit drugs  C. Current medications and supplements D. Functional ability and status E.  Nutritional status F.  Physical activity G. Advance directives H. List of other physicians I.  Hospitalizations, surgeries, and ER visits in previous 12 months J.  [place] such as hearing and vision if needed, cognitive and depression L. Referrals and appointments   In addition, I have reviewed and discussed with patient certain preventive protocols, quality metrics, and best practice recommendations. A written personalized care plan for preventive services as well as general preventive health recommendations were provided to patient.   Signed,  Clemetine Marker, LPN Nurse Health Advisor   Nurse Notes:pt's blood pressure elevated 156/82 at start of visit and 160/86 at repeat. Pt also seeing Dr. Ancil Boozer today.

## 2018-12-25 NOTE — Patient Instructions (Signed)
Lori Pacheco , Thank you for taking time to come for your Medicare Wellness Visit. I appreciate your ongoing commitment to your health goals. Please review the following plan we discussed and let me know if I can assist you in the future.   Screening recommendations/referrals: Colonoscopy: no longer required Mammogram: no longer required Bone Density: no longer required Recommended yearly ophthalmology/optometry visit for glaucoma screening and checkup Recommended yearly dental visit for hygiene and checkup  Vaccinations: Influenza vaccine: done 07/23/18 Pneumococcal vaccine: done 09/27/16 Tdap vaccine: done 07/06/12 Shingles vaccine: Shingrix discussed.      Advanced directives: Please bring a copy of your health care power of attorney and living will to the office at your convenience once you have completed those documents.   Conditions/risks identified: Continue healthy eating and physical activity.   Next appointment: Please follow up in one year for your Medicare Annual Wellness visit.     Preventive Care 83 Years and Older, Female Preventive care refers to lifestyle choices and visits with your health care provider that can promote health and wellness. What does preventive care include?  A yearly physical exam. This is also called an annual well check.  Dental exams once or twice a year.  Routine eye exams. Ask your health care provider how often you should have your eyes checked.  Personal lifestyle choices, including:  Daily care of your teeth and gums.  Regular physical activity.  Eating a healthy diet.  Avoiding tobacco and drug use.  Limiting alcohol use.  Practicing safe sex.  Taking low-dose aspirin every day.  Taking vitamin and mineral supplements as recommended by your health care provider. What happens during an annual well check? The services and screenings done by your health care provider during your annual well check will depend on your age,  overall health, lifestyle risk factors, and family history of disease. Counseling  Your health care provider may ask you questions about your:  Alcohol use.  Tobacco use.  Drug use.  Emotional well-being.  Home and relationship well-being.  Sexual activity.  Eating habits.  History of falls.  Memory and ability to understand (cognition).  Work and work Statistician.  Reproductive health. Screening  You may have the following tests or measurements:  Height, weight, and BMI.  Blood pressure.  Lipid and cholesterol levels. These may be checked every 5 years, or more frequently if you are over 43 years old.  Skin check.  Lung cancer screening. You may have this screening every year starting at age 83 if you have a 30-pack-year history of smoking and currently smoke or have quit within the past 15 years.  Fecal occult blood test (FOBT) of the stool. You may have this test every year starting at age 9.  Flexible sigmoidoscopy or colonoscopy. You may have a sigmoidoscopy every 5 years or a colonoscopy every 10 years starting at age 83.  Hepatitis C blood test.  Hepatitis B blood test.  Sexually transmitted disease (STD) testing.  Diabetes screening. This is done by checking your blood sugar (glucose) after you have not eaten for a while (fasting). You may have this done every 1-3 years.  Bone density scan. This is done to screen for osteoporosis. You may have this done starting at age 42.  Mammogram. This may be done every 1-2 years. Talk to your health care provider about how often you should have regular mammograms. Talk with your health care provider about your test results, treatment options, and if necessary, the need for  more tests. Vaccines  Your health care provider may recommend certain vaccines, such as:  Influenza vaccine. This is recommended every year.  Tetanus, diphtheria, and acellular pertussis (Tdap, Td) vaccine. You may need a Td booster every 10  years.  Zoster vaccine. You may need this after age 34.  Pneumococcal 13-valent conjugate (PCV13) vaccine. One dose is recommended after age 1.  Pneumococcal polysaccharide (PPSV23) vaccine. One dose is recommended after age 11. Talk to your health care provider about which screenings and vaccines you need and how often you need them. This information is not intended to replace advice given to you by your health care provider. Make sure you discuss any questions you have with your health care provider. Document Released: 10/30/2015 Document Revised: 06/22/2016 Document Reviewed: 08/04/2015 Elsevier Interactive Patient Education  2017 [employer] Prevention in the Home Falls can cause injuries. They can happen to people of all ages. There are many things you can do to make your home safe and to help prevent falls. What can I do on the outside of my home?  Regularly fix the edges of walkways and driveways and fix any cracks.  Remove anything that might make you trip as you walk through a door, such as a raised step or threshold.  Trim any bushes or trees on the path to your home.  Use bright outdoor lighting.  Clear any walking paths of anything that might make someone trip, such as rocks or tools.  Regularly check to see if handrails are loose or broken. Make sure that both sides of any steps have handrails.  Any raised decks and porches should have guardrails on the edges.  Have any leaves, snow, or ice cleared regularly.  Use sand or salt on walking paths during winter.  Clean up any spills in your garage right away. This includes oil or grease spills. What can I do in the bathroom?  Use night lights.  Install grab bars by the toilet and in the tub and shower. Do not use towel bars as grab bars.  Use non-skid mats or decals in the tub or shower.  If you need to sit down in the shower, use a plastic, non-slip stool.  Keep the floor dry. Clean up any water that  spills on the floor as soon as it happens.  Remove soap buildup in the tub or shower regularly.  Attach bath mats securely with double-sided non-slip rug tape.  Do not have throw rugs and other things on the floor that can make you trip. What can I do in the bedroom?  Use night lights.  Make sure that you have a light by your bed that is easy to reach.  Do not use any sheets or blankets that are too big for your bed. They should not hang down onto the floor.  Have a firm chair that has side arms. You can use this for support while you get dressed.  Do not have throw rugs and other things on the floor that can make you trip. What can I do in the kitchen?  Clean up any spills right away.  Avoid walking on wet floors.  Keep items that you use a lot in easy-to-reach places.  If you need to reach something above you, use a strong step stool that has a grab bar.  Keep electrical cords out of the way.  Do not use floor polish or wax that makes floors slippery. If you must use wax, use non-skid  floor wax.  Do not have throw rugs and other things on the floor that can make you trip. What can I do with my stairs?  Do not leave any items on the stairs.  Make sure that there are handrails on both sides of the stairs and use them. Fix handrails that are broken or loose. Make sure that handrails are as long as the stairways.  Check any carpeting to make sure that it is firmly attached to the stairs. Fix any carpet that is loose or worn.  Avoid having throw rugs at the top or bottom of the stairs. If you do have throw rugs, attach them to the floor with carpet tape.  Make sure that you have a light switch at the top of the stairs and the bottom of the stairs. If you do not have them, ask someone to add them for you. What else can I do to help prevent falls?  Wear shoes that:  Do not have high heels.  Have rubber bottoms.  Are comfortable and fit you well.  Are closed at the  toe. Do not wear sandals.  If you use a stepladder:  Make sure that it is fully opened. Do not climb a closed stepladder.  Make sure that both sides of the stepladder are locked into place.  Ask someone to hold it for you, if possible.  Clearly mark and make sure that you can see:  Any grab bars or handrails.  First and last steps.  Where the edge of each step is.  Use tools that help you move around (mobility aids) if they are needed. These include:  Canes.  Walkers.  Scooters.  Crutches.  Turn on the lights when you go into a dark area. Replace any light bulbs as soon as they burn out.  Set up your furniture so you have a clear path. Avoid moving your furniture around.  If any of your floors are uneven, fix them.  If there are any pets around you, be aware of where they are.  Review your medicines with your doctor. Some medicines can make you feel dizzy. This can increase your chance of falling. Ask your doctor what other things that you can do to help prevent falls. This information is not intended to replace advice given to you by your health care provider. Make sure you discuss any questions you have with your health care provider. Document Released: 07/30/2009 Document Revised: 03/10/2016 Document Reviewed: 11/07/2014 Elsevier Interactive Patient Education  2017 [employer].

## 2018-12-26 ENCOUNTER — Other Ambulatory Visit: Payer: Self-pay | Admitting: Family Medicine

## 2018-12-26 DIAGNOSIS — E039 Hypothyroidism, unspecified: Secondary | ICD-10-CM

## 2018-12-26 LAB — TSH: TSH: 11.94 mIU/L — ABNORMAL HIGH (ref 0.40–4.50)

## 2018-12-26 MED ORDER — LEVOTHYROXINE SODIUM 112 MCG PO TABS: 112.0000 ug | ORAL_TABLET | Freq: Every day | ORAL | 0 refills | Status: DC

## 2019-01-29 ENCOUNTER — Other Ambulatory Visit: Payer: Self-pay | Admitting: Family Medicine

## 2019-01-29 DIAGNOSIS — E039 Hypothyroidism, unspecified: Secondary | ICD-10-CM

## 2019-01-29 DIAGNOSIS — N2581 Secondary hyperparathyroidism of renal origin: Secondary | ICD-10-CM

## 2019-02-07 ENCOUNTER — Encounter: Payer: Self-pay | Admitting: Family Medicine

## 2019-04-15 ENCOUNTER — Encounter: Payer: Self-pay | Admitting: Family Medicine

## 2019-04-15 ENCOUNTER — Ambulatory Visit: Payer: Medicare Other | Admitting: Family Medicine

## 2019-04-15 ENCOUNTER — Other Ambulatory Visit: Payer: Self-pay

## 2019-04-15 VITALS — BP 164/94 | HR 96 | Temp 98.2°F | Resp 16 | Ht 62.0 in | Wt 127.5 lb

## 2019-04-15 DIAGNOSIS — M545 Low back pain: Secondary | ICD-10-CM | POA: Diagnosis not present

## 2019-04-15 DIAGNOSIS — N2581 Secondary hyperparathyroidism of renal origin: Secondary | ICD-10-CM

## 2019-04-15 DIAGNOSIS — N183 Chronic kidney disease, stage 3 unspecified: Secondary | ICD-10-CM

## 2019-04-15 DIAGNOSIS — I129 Hypertensive chronic kidney disease with stage 1 through stage 4 chronic kidney disease, or unspecified chronic kidney disease: Secondary | ICD-10-CM

## 2019-04-15 DIAGNOSIS — G8929 Other chronic pain: Secondary | ICD-10-CM

## 2019-04-15 DIAGNOSIS — E039 Hypothyroidism, unspecified: Secondary | ICD-10-CM

## 2019-04-15 DIAGNOSIS — I10 Essential (primary) hypertension: Secondary | ICD-10-CM | POA: Diagnosis not present

## 2019-04-15 MED ORDER — VITAMIN D (ERGOCALCIFEROL) 1.25 MG (50000 UNIT) PO CAPS: 50000.0000 [IU] | ORAL_CAPSULE | ORAL | 1 refills | Status: DC

## 2019-04-15 MED ORDER — LISINOPRIL 20 MG PO TABS: 20.0000 mg | ORAL_TABLET | Freq: Every day | ORAL | 5 refills | Status: DC

## 2019-04-15 MED ORDER — BLOOD GLUCOSE-BP MONITOR DEVI: 1.0000 | Freq: Every day | 0 refills | Status: AC

## 2019-04-15 MED ORDER — AMLODIPINE BESYLATE 10 MG PO TABS: 10.0000 mg | ORAL_TABLET | Freq: Every day | ORAL | 2 refills | Status: DC

## 2019-04-15 NOTE — Progress Notes (Signed)
Name: Lori Pacheco   MRN: [id number]    DOB: 12-05-1920   Date:04/15/2019       Progress Note  Subjective  Chief Complaint  Chief Complaint  Patient presents with  . Medication Refill    3 month F/U  . Depression  . Hypertension    States she is getting dizzy when she stands up really quick  . Anxiety    Unchanged  . Hypothyroidism    constipation always  . CKI Stage III    HPI  She came in today with her daughter  -  [name]  Patient is a poor historian, but cooperative and does not seem in distress today   Epigastric pain: she denies pain today , states sometimes feels like vomiting  Anxiety/Depression: daughter states she has been a little down over the past couple of days, patient states hydroxyzine helps with anxiety and is not sure if she wants to take something daily.  She continues to having intermittent feeling of nervousness  inside, feels like little worms are crawling under her skin, intermittent, she states improves when she uses rubbing alcohol, it improves with hydroxyzine  She does not like taking medication daily and wants to hold off on SSRI   HTN: bp is still high, willing to go up on norvasc from 5 mg to 10 mg, bp machine that she has at home is broken but daughter will get another one today   History of chronic DVT left: stable and no pain, not on blood thinners, high risk of fall. Seen by vascular surgeon and advised compression stocking hoses and leg elevation no problems since  Hypothyroidism: she takes medications on and off, we will recheck TSH today, last level was still elevated  Cognitive dysfunction, stable   CKI stage III: review recent labs, she has normal urine output She is on ACE and pruritis has decreased   Leucopenia:  seen by hematologist in the past, but refuses further evaluation. We will recheck    Patient Active Problem List   Diagnosis Date Noted  . Cognitive dysfunction 12/25/2018  . Varicose veins of leg with pain,  bilateral 12/04/2018  . Deep vein thrombosis (DVT) of femoral vein of left lower extremity ([place]) 10/17/2018  . Other constipation 04/23/2018  . Leg cramps 04/23/2018  . Secondary renal hyperparathyroidism ([place]) 05/15/2017  . Atherosclerosis of abdominal aorta ([place]) 11/10/2016  . Perennial allergic rhinitis with seasonal variation 11/10/2016  . Vitamin D deficiency 03/29/2016  . Hypothyroidism 09/18/2015  . Essential hypertension 05/19/2015  . Hyperlipemia 05/19/2015  . Chronic kidney disease, stage IV (severe) ([place]) 05/19/2015    Past Surgical History:  Procedure Laterality Date  . NO PAST SURGERIES      Family History  Problem Relation Age of Onset  . Healthy Mother   . Healthy Father   . Heart disease Brother   . Cancer Brother   . Cancer Daughter   . Alcohol abuse Son   . Diabetes Son   . Heart disease Son     Social History   Socioeconomic History  . Marital status: Widowed    Spouse name: Jeneen Rinks  . Number of children: 45  . Years of education: Not on file  . Highest education level: 3rd grade  Occupational History  . Occupation: Retired  Scientific laboratory technician  . Financial resource strain: Not hard at all  . Food insecurity    Worry: Never true    Inability: Never true  . Transportation needs  Medical: No    Non-medical: No  Tobacco Use  . Smoking status: Never Smoker  . Smokeless tobacco: Former Systems developer    Types: Snuff  . Tobacco comment: smoking cessation materials not required  Substance and Sexual Activity  . Alcohol use: No    Alcohol/week: 0.0 standard drinks  . Drug use: No  . Sexual activity: Not Currently  Lifestyle  . Physical activity    Days per week: 7 days    Minutes per session: 60 min  . Stress: To some extent  Relationships  . Social connections    Talks on phone: More than three times a week    Gets together: Twice a week    Attends religious service: More than 4 times per year    Active member of club or organization: No    Attends  meetings of clubs or organizations: Never    Relationship status: Widowed  . Intimate partner violence    Fear of current or ex partner: No    Emotionally abused: No    Physically abused: No    Forced sexual activity: No  Other Topics Concern  . Not on file  Social History Narrative         Current Outpatient Medications:  .  acetaminophen (TYLENOL) 500 MG tablet, Take 1 tablet (500 mg total) by mouth every 6 (six) hours as needed., Disp: 100 tablet, Rfl: 0 .  amLODipine (NORVASC) 5 MG tablet, Take 1 tablet (5 mg total) by mouth daily., Disp: 30 tablet, Rfl: 5 .  hydrALAZINE (APRESOLINE) 10 MG tablet, Take 1 tablet (10 mg total) by mouth every 6 (six) hours as needed. IF BP IS ABOVE 160/90, Disp: 30 tablet, Rfl: 1 .  hydrOXYzine (ATARAX/VISTARIL) 10 MG tablet, Take 1 tablet (10 mg total) by mouth 3 (three) times daily as needed for itching or anxiety., Disp: 90 tablet, Rfl: 0 .  levothyroxine (SYNTHROID, LEVOTHROID) 112 MCG tablet, TAKE 1 TABLET BY MOUTH ONCE DAILY BEFORE BREAKFAST, Disp: 90 tablet, Rfl: 0 .  lisinopril (PRINIVIL,ZESTRIL) 20 MG tablet, Take 1 tablet (20 mg total) by mouth daily., Disp: 30 tablet, Rfl: 5 .  Vitamin D, Ergocalciferol, (DRISDOL) 1.25 MG (50000 UT) CAPS capsule, Take 1 capsule by mouth once a week, Disp: 12 capsule, Rfl: 0  No Known Allergies  I personally reviewed active problem list, medication list, allergies, family history, social history with the patient/caregiver today.   ROS  Ten systems reviewed and is negative except as mentioned in HPI   Objective  Vitals:   04/15/19 1432  BP: (!) 180/100  Pulse: 96  Resp: 16  Temp: 98.2 F (36.8 C)  TempSrc: Oral  SpO2: 96%  Weight: 127 lb 8 oz (57.8 kg)  Height: 5\' 2"  (1.575 m)    Body mass index is 23.32 kg/m.  Physical Exam  Constitutional: Patient appears frail, kyphosis, cooperative  HEENT: head atraumatic, normocephalic, pupils equal and reactive to light,  neck supple, oral exam not  done  Cardiovascular: Normal rate, regular rhythm and normal heart sounds.  No murmur heard. No BLE edema. Pulmonary/Chest: Effort normal and breath sounds normal. No respiratory distress. Abdominal: Soft.  There is no tenderness. Psychiatric: Patient has a normal mood and affect. behavior is normal.  PHQ2/9: Depression screen [facility] 2/9 04/15/2019 12/25/2018 12/25/2018 11/22/2018 10/16/2018  Decreased Interest 1 0 0 0 0  Down, Depressed, Hopeless 1 0 0 0 1  PHQ - 2 Score 2 0 0 0 1  Altered sleeping 1 0 - - -  Tired, decreased energy 1 0 - - -  Change in appetite 1 0 - - -  Feeling bad or failure about yourself  0 0 - - -  Trouble concentrating 0 0 - - -  Moving slowly or fidgety/restless 0 0 - - -  Suicidal thoughts 1 0 - - -  PHQ-9 Score 6 0 - - -  Difficult doing work/chores Somewhat difficult Not difficult at all - - -    phq 9 is positive   Fall Risk: Fall Risk  04/15/2019 12/25/2018 11/22/2018 10/16/2018 07/23/2018  Falls in the past year? 0 1 1 1  No  Comment - - - - -  Number falls in past yr: 0 1 1 1  -  Injury with Fall? 0 0 0 0 -  Risk Factor Category  - - - - -  Comment - - - - -  Risk for fall due to : - History of fall(s);Impaired balance/gait - Other (Comment) -  Risk for fall due to: Comment - - - Gets in a hurry -  Follow up - Falls prevention discussed - - -     Functional Status Survey: Is the patient deaf or have difficulty hearing?: No Does the patient have difficulty seeing, even when wearing glasses/contacts?: No Does the patient have difficulty concentrating, remembering, or making decisions?: No Does the patient have difficulty walking or climbing stairs?: No Does the patient have difficulty dressing or bathing?: No Does the patient have difficulty doing errands alone such as visiting a doctor's office or shopping?: Yes    Assessment & Plan   1. Chronic bilateral low back pain without sciatica   2. Hypothyroidism, unspecified type  - TSH  3.  Essential hypertension  - amLODipine (NORVASC) 10 MG tablet; Take 1 tablet (10 mg total) by mouth daily.  Dispense: 30 tablet; Refill: 2 - lisinopril (ZESTRIL) 20 MG tablet; Take 1 tablet (20 mg total) by mouth daily.  Dispense: 30 tablet; Refill: 5 - Blood Glucose-BP Monitor (BLOOD GLUCOSE-WRIST BP MONITOR) DEVI; 1 each by Does not apply route daily.  Dispense: 1 each; Refill: 0  4. Benign hypertension with chronic kidney disease, stage III (HCC)  - amLODipine (NORVASC) 10 MG tablet; Take 1 tablet (10 mg total) by mouth daily.  Dispense: 30 tablet; Refill: 2 - lisinopril (ZESTRIL) 20 MG tablet; Take 1 tablet (20 mg total) by mouth daily.  Dispense: 30 tablet; Refill: 5 - Blood Glucose-BP Monitor (BLOOD GLUCOSE-WRIST BP MONITOR) DEVI; 1 each by Does not apply route daily.  Dispense: 1 each; Refill: 0  5. Chronic kidney disease, stage III (moderate) (HCC)  - lisinopril (ZESTRIL) 20 MG tablet; Take 1 tablet (20 mg total) by mouth daily.  Dispense: 30 tablet; Refill: 5  6. Secondary renal hyperparathyroidism (HCC)  - Vitamin D, Ergocalciferol, (DRISDOL) 1.25 MG (50000 UT) CAPS capsule; Take 1 capsule (50,000 Units total) by mouth once a week.  Dispense: 12 capsule; Refill: 1

## 2019-04-16 LAB — TSH: TSH: 4.07 mIU/L (ref 0.40–4.50)

## 2019-05-18 ENCOUNTER — Other Ambulatory Visit: Payer: Self-pay

## 2019-05-18 ENCOUNTER — Emergency Department: Payer: Medicare Other

## 2019-05-18 ENCOUNTER — Encounter: Payer: Self-pay | Admitting: Emergency Medicine

## 2019-05-18 ENCOUNTER — Emergency Department
Admission: EM | Admit: 2019-05-18 | Discharge: 2019-05-18 | Disposition: A | Discharge status: Home / Self Care | Payer: Medicare Other | Attending: Emergency Medicine | Admitting: Emergency Medicine

## 2019-05-18 DIAGNOSIS — Z79899 Other long term (current) drug therapy: Secondary | ICD-10-CM | POA: Diagnosis not present

## 2019-05-18 DIAGNOSIS — E039 Hypothyroidism, unspecified: Secondary | ICD-10-CM | POA: Insufficient documentation

## 2019-05-18 DIAGNOSIS — R110 Nausea: Secondary | ICD-10-CM | POA: Insufficient documentation

## 2019-05-18 DIAGNOSIS — I129 Hypertensive chronic kidney disease with stage 1 through stage 4 chronic kidney disease, or unspecified chronic kidney disease: Secondary | ICD-10-CM | POA: Insufficient documentation

## 2019-05-18 DIAGNOSIS — M545 Low back pain: Secondary | ICD-10-CM | POA: Insufficient documentation

## 2019-05-18 DIAGNOSIS — Z87891 Personal history of nicotine dependence: Secondary | ICD-10-CM | POA: Insufficient documentation

## 2019-05-18 DIAGNOSIS — N184 Chronic kidney disease, stage 4 (severe): Secondary | ICD-10-CM | POA: Insufficient documentation

## 2019-05-18 DIAGNOSIS — K5790 Diverticulosis of intestine, part unspecified, without perforation or abscess without bleeding: Secondary | ICD-10-CM | POA: Diagnosis not present

## 2019-05-18 DIAGNOSIS — G8929 Other chronic pain: Secondary | ICD-10-CM | POA: Diagnosis not present

## 2019-05-18 LAB — CBC WITH DIFFERENTIAL/PLATELET
Abs Immature Granulocytes: 0.01 10*3/uL (ref 0.00–0.07)
Basophils Absolute: 0 10*3/uL (ref 0.0–0.1)
Basophils Relative: 1 %
Eosinophils Absolute: 0 10*3/uL (ref 0.0–0.5)
Eosinophils Relative: 2 %
HCT: 35.6 % — ABNORMAL LOW (ref 36.0–46.0)
Hemoglobin: 11.4 g/dL — ABNORMAL LOW (ref 12.0–15.0)
Immature Granulocytes: 0 %
Lymphocytes Relative: 35 %
Lymphs Abs: 0.9 10*3/uL (ref 0.7–4.0)
MCH: 28.9 pg (ref 26.0–34.0)
MCHC: 32 g/dL (ref 30.0–36.0)
MCV: 90.4 fL (ref 80.0–100.0)
Monocytes Absolute: 0.2 10*3/uL (ref 0.1–1.0)
Monocytes Relative: 8 %
Neutro Abs: 1.4 10*3/uL — ABNORMAL LOW (ref 1.7–7.7)
Neutrophils Relative %: 54 %
Platelets: 128 10*3/uL — ABNORMAL LOW (ref 150–400)
RBC: 3.94 MIL/uL (ref 3.87–5.11)
RDW: 13.6 % (ref 11.5–15.5)
WBC: 2.6 10*3/uL — ABNORMAL LOW (ref 4.0–10.5)
nRBC: 0 % (ref 0.0–0.2)

## 2019-05-18 LAB — COMPREHENSIVE METABOLIC PANEL
ALT: 10 U/L (ref 0–44)
AST: 16 U/L (ref 15–41)
Albumin: 3.8 g/dL (ref 3.5–5.0)
Alkaline Phosphatase: 55 U/L (ref 38–126)
Anion gap: 8 (ref 5–15)
BUN: 33 mg/dL — ABNORMAL HIGH (ref 8–23)
CO2: 25 mmol/L (ref 22–32)
Calcium: 9 mg/dL (ref 8.9–10.3)
Chloride: 108 mmol/L (ref 98–111)
Creatinine, Ser: 1.43 mg/dL — ABNORMAL HIGH (ref 0.44–1.00)
GFR calc Af Amer: 36 mL/min — ABNORMAL LOW (ref >60)
GFR calc non Af Amer: 31 mL/min — ABNORMAL LOW (ref >60)
Glucose, Bld: 108 mg/dL — ABNORMAL HIGH (ref 70–99)
Potassium: 4.3 mmol/L (ref 3.5–5.1)
Sodium: 141 mmol/L (ref 135–145)
Total Bilirubin: 0.7 mg/dL (ref 0.3–1.2)
Total Protein: 6.9 g/dL (ref 6.5–8.1)

## 2019-05-18 LAB — URINALYSIS, COMPLETE (UACMP) WITH MICROSCOPIC
Bacteria, UA: NONE SEEN
Bilirubin Urine: NEGATIVE
Glucose, UA: NEGATIVE mg/dL
Ketones, ur: NEGATIVE mg/dL
Nitrite: NEGATIVE
Protein, ur: NEGATIVE mg/dL
Specific Gravity, Urine: 1.019 (ref 1.005–1.030)
pH: 7 (ref 5.0–8.0)

## 2019-05-18 LAB — LIPASE, BLOOD: Lipase: 40 U/L (ref 11–51)

## 2019-05-18 MED ORDER — METRONIDAZOLE 500 MG PO TABS: 500.0000 mg | ORAL_TABLET | Freq: Three times a day (TID) | ORAL | 0 refills | Status: AC

## 2019-05-18 MED ORDER — IOHEXOL 300 MG/ML  SOLN
75.0000 mL | Freq: Once | INTRAMUSCULAR | Status: AC | PRN
  Administered 2019-05-18: 75 mL via INTRAVENOUS

## 2019-05-18 MED ORDER — SODIUM CHLORIDE 0.9 % IV BOLUS
1000.0000 mL | Freq: Once | INTRAVENOUS | Status: AC
  Administered 2019-05-18: 1000 mL via INTRAVENOUS

## 2019-05-18 NOTE — ED Triage Notes (Signed)
Pt arrived via POV with reports of right side pain, pt states it is difficult to walk and states it has been going on for a long time.   Daughter with patient states patient has had increased weakness over the passed week.

## 2019-05-18 NOTE — ED Notes (Signed)
Pt ambulated to toilet w/ minimal assistance

## 2019-05-18 NOTE — ED Notes (Signed)
Patient transported to CT 

## 2019-05-18 NOTE — ED Provider Notes (Signed)
[facility] Emergency Department Provider Note  ____________________________________________  Time seen: Approximately 9:07 AM  I have reviewed the triage vital signs and the nursing notes.   HISTORY  Chief Complaint Weakness    HPI DALEIGH POLLINGER is a 85 y.o. female with a history of hypertension hyperlipidemia who was brought to the ED by her daughter for evaluation of chronic low back pain.  Patient and daughter agree that the pain is been going on for years, bilateral lower back radiating around the sides.  Was worse this morning so she came for evaluation but overall unchanged.  Denies dysuria frequency urgency vomiting or diarrhea.  No constipation.  She does endorse nausea.  Patient reports pain is worse with movement, better sitting still.  Moderate intensity.  She also has occasional falls.  Daughter reports that the patient needs a cane to walk but refuses to use it.  No recent known falls.      Past Medical History:  Diagnosis Date  . Hyperlipidemia   . Hypertension   . Thyroid disease      Patient Active Problem List   Diagnosis Date Noted  . Cognitive dysfunction 12/25/2018  . Varicose veins of leg with pain, bilateral 12/04/2018  . Deep vein thrombosis (DVT) of femoral vein of left lower extremity ([place]) 10/17/2018  . Other constipation 04/23/2018  . Leg cramps 04/23/2018  . Secondary renal hyperparathyroidism ([place]) 05/15/2017  . Atherosclerosis of abdominal aorta ([place]) 11/10/2016  . Perennial allergic rhinitis with seasonal variation 11/10/2016  . Vitamin D deficiency 03/29/2016  . Hypothyroidism 09/18/2015  . Essential hypertension 05/19/2015  . Hyperlipemia 05/19/2015  . Chronic kidney disease, stage IV (severe) ([place]) 05/19/2015     Past Surgical History:  Procedure Laterality Date  . NO PAST SURGERIES       Prior to Admission medications   Medication Sig Start Date End Date Taking? Authorizing Provider  acetaminophen  (TYLENOL) 500 MG tablet Take 1 tablet (500 mg total) by mouth every 6 (six) hours as needed. 07/24/17   Steele Sizer, MD  amLODipine (NORVASC) 10 MG tablet Take 1 tablet (10 mg total) by mouth daily. 04/15/19   Steele Sizer, MD  Blood Glucose-BP Monitor (BLOOD GLUCOSE-WRIST BP MONITOR) DEVI 1 each by Does not apply route daily. 04/15/19   Steele Sizer, MD  hydrALAZINE (APRESOLINE) 10 MG tablet Take 1 tablet (10 mg total) by mouth every 6 (six) hours as needed. IF BP IS ABOVE 160/90 12/22/17   Hubbard Hartshorn, FNP  hydrOXYzine (ATARAX/VISTARIL) 10 MG tablet Take 1 tablet (10 mg total) by mouth 3 (three) times daily as needed for itching or anxiety. 11/22/18   Steele Sizer, MD  levothyroxine (SYNTHROID, LEVOTHROID) 112 MCG tablet TAKE 1 TABLET BY MOUTH ONCE DAILY BEFORE BREAKFAST 01/29/19   Ancil Boozer, Drue Stager, MD  lisinopril (ZESTRIL) 20 MG tablet Take 1 tablet (20 mg total) by mouth daily. 04/15/19   Steele Sizer, MD  metroNIDAZOLE (FLAGYL) 500 MG tablet Take 1 tablet (500 mg total) by mouth 3 (three) times daily for 5 days. 05/18/19 05/23/19  Carrie Mew, MD  Vitamin D, Ergocalciferol, (DRISDOL) 1.25 MG (50000 UT) CAPS capsule Take 1 capsule (50,000 Units total) by mouth once a week. 04/15/19   Steele Sizer, MD     Allergies Patient has no known allergies.   Family History  Problem Relation Age of Onset  . Healthy Mother   . Healthy Father   . Heart disease Brother   . Cancer Brother   .  Cancer Daughter   . Alcohol abuse Son   . Diabetes Son   . Heart disease Son     Social History Social History   Tobacco Use  . Smoking status: Never Smoker  . Smokeless tobacco: Former Systems developer    Types: Snuff  . Tobacco comment: smoking cessation materials not required  Substance Use Topics  . Alcohol use: No    Alcohol/week: 0.0 standard drinks  . Drug use: No    Review of Systems  Constitutional:   No fever or chills.  ENT:   No sore throat. No rhinorrhea. Cardiovascular:   No  chest pain or syncope. Respiratory:   No dyspnea or cough. Gastrointestinal:   Negative for abdominal pain, vomiting and diarrhea.  Musculoskeletal:   Chronic low back pain as above. All other systems reviewed and are negative except as documented above in ROS and HPI.  ____________________________________________   PHYSICAL EXAM:  VITAL SIGNS: ED Triage Vitals  Enc Vitals Group     BP 05/18/19 0845 (!) 174/102     Pulse Rate 05/18/19 0845 78     Resp 05/18/19 0845 18     Temp 05/18/19 0845 99.2 F (37.3 C)     Temp Source 05/18/19 0845 Oral     SpO2 05/18/19 0845 99 %     Weight 05/18/19 0846 127 lb (57.6 kg)     Height 05/18/19 0846 5\' 2"  (1.575 m)     Head Circumference --      Peak Flow --      Pain Score --      Pain Loc --      Pain Edu? --      Excl. in [place]? --     Vital signs reviewed, nursing assessments reviewed.   Constitutional:   Alert and oriented. Non-toxic appearance. Eyes:   Conjunctivae are normal. EOMI. PERRL. ENT      Head:   Normocephalic and atraumatic.      Nose:   No congestion/rhinnorhea.       Mouth/Throat:   MMM, no pharyngeal erythema. No peritonsillar mass.       Neck:   No meningismus. Full ROM. Hematological/Lymphatic/Immunilogical:   No cervical lymphadenopathy. Cardiovascular:   RRR. Symmetric bilateral radial and DP pulses.  No murmurs. Cap refill less than 2 seconds. Respiratory:   Normal respiratory effort without tachypnea/retractions. Breath sounds are clear and equal bilaterally. No wheezes/rales/rhonchi. Gastrointestinal:   Soft with right lower quadrant tenderness. Non distended. There is no CVA tenderness.  No rebound, rigidity, or guarding.  Musculoskeletal:   Normal range of motion in all extremities. No joint effusions.  No lower extremity tenderness.  No edema.  No midline spinal tenderness Neurologic:   Normal speech and language.  Motor grossly intact. No acute focal neurologic deficits are appreciated.  Skin:    Skin  is warm, dry and intact. No rash noted.  No petechiae, purpura, or bullae.  ____________________________________________    LABS (pertinent positives/negatives) (all labs ordered are listed, but only abnormal results are displayed) Labs Reviewed  COMPREHENSIVE METABOLIC PANEL - Abnormal; Notable for the following components:      Result Value   Glucose, Bld 108 (*)    BUN 33 (*)    Creatinine, Ser 1.43 (*)    GFR calc non Af Amer 31 (*)    GFR calc Af Amer 36 (*)    All other components within normal limits  CBC WITH DIFFERENTIAL/PLATELET - Abnormal; Notable for the following components:  WBC 2.6 (*)    Hemoglobin 11.4 (*)    HCT 35.6 (*)    Platelets 128 (*)    Neutro Abs 1.4 (*)    All other components within normal limits  URINALYSIS, COMPLETE (UACMP) WITH MICROSCOPIC - Abnormal; Notable for the following components:   Color, Urine STRAW (*)    APPearance CLEAR (*)    Hgb urine dipstick SMALL (*)    Leukocytes,Ua SMALL (*)    All other components within normal limits  URINE CULTURE  LIPASE, BLOOD   ____________________________________________   EKG    ____________________________________________    RADIOLOGY  Ct Abdomen Pelvis W Contrast  Result Date: 05/18/2019 CLINICAL DATA:  85 year old female with right side pain. Increased weakness. EXAM: CT ABDOMEN AND PELVIS WITH CONTRAST TECHNIQUE: Multidetector CT imaging of the abdomen and pelvis was performed using the standard protocol following bolus administration of intravenous contrast. CONTRAST:  28mL OMNIPAQUE IOHEXOL 300 MG/ML  SOLN COMPARISON:  CT Abdomen and Pelvis 10/03/2018. 04/16/2016. CT Abdomen and Pelvis FINDINGS: Lower chest: Stable cardiomegaly. No pericardial effusion. Tortuous and calcified descending thoracic aorta is stable. Negative lung bases. No pleural effusion. Calcified coronary artery atherosclerosis. Hepatobiliary: Negative gallbladder. Liver enhancement is normal. There is borderline to mild  central intrahepatic ductal dilatation, stable since 2017. The CBD is also at the upper limits of normal, stable. No CBD filling defect is identified. Pancreas: No pancreatic inflammation. The main pancreatic duct is at the upper limits of normal, stable. Spleen: Negative. Adrenals/Urinary Tract: Normal adrenal glands. Mild right renal heterotopia (normal variant). Bilateral renal enhancement and contrast excretion is symmetric and within normal limits. Occasional small renal cysts. Punctate left nephrolithiasis or nephrocalcinosis is stable on image 30. No hydroureter is evident. Unremarkable urinary bladder. Stomach/Bowel: No oral contrast today. The proximal rectum and sigmoid colon are decompressed which may account for the appearance of mild wall thickening. The junction of the descending and sigmoid is tortuous and difficult to delineate. There is retained stool in the left colon. The transverse colon is redundant with retained stool. The cecum is located in the pelvis. The terminal ileum contains fluid but is nondilated. There are large bowel diverticula associated with the right colon or cecum. The appendix is not delineated. The distal small bowel is nondilated. Small bowel in the abdomen is nondilated and the stomach is decompressed. The duodenum is decompressed. There is no free air. No free fluid in the abdomen. Vascular/Lymphatic: Tortuous aorta. Aortoiliac calcified atherosclerosis. Calcified proximal femoral artery atherosclerosis. But the major arterial structures in the abdomen and pelvis are patent. There are small chronic venous varices in the proximal anterior right thigh. Small calcified right inguinal lymph node is stable. Portal venous system is patent. No lymphadenopathy. Reproductive: Negative. Other: There may be trace pelvic free fluid (series 2, image 67) but this is stable from prior studies. Musculoskeletal: Scoliosis with advanced degenerative changes at the thoracolumbar junction and  upper lumbar spine. No acute osseous abnormality identified. IMPRESSION: 1. No acute or inflammatory process identified in the abdomen or pelvis. 2. Multiple stable chronic findings including biliary system at the upper limits of normal, diverticulosis of the right colon, trace pelvic free fluid. 3. Cardiomegaly. Calcified coronary artery and Aortic Atherosclerosis (ICD10-I70.0). Electronically Signed   By: Genevie Ann M.D.   On: 05/18/2019 10:18    ____________________________________________   PROCEDURES Procedures  ____________________________________________  DIFFERENTIAL DIAGNOSIS   Appendicitis, colitis, musculoskeletal pain/arthritis, cystitis, ureterolithiasis  CLINICAL IMPRESSION / ASSESSMENT AND PLAN / ED COURSE  Medications  ordered in the ED: Medications  iohexol (OMNIPAQUE) 300 MG/ML solution 75 mL (75 mLs Intravenous Contrast Given 05/18/19 0953)  sodium chloride 0.9 % bolus 1,000 mL (1,000 mLs Intravenous New Bag/Given 05/18/19 1023)    Pertinent labs & imaging results that were available during my care of the patient were reviewed by me and considered in my medical decision making (see chart for details).  JANERA PEUGH was evaluated in Emergency Department on 05/18/2019 for the symptoms described in the history of present illness. She was evaluated in the context of the global COVID-19 pandemic, which necessitated consideration that the patient might be at risk for infection with the SARS-CoV-2 virus that causes COVID-19. Institutional protocols and algorithms that pertain to the evaluation of patients at risk for COVID-19 are in a state of rapid change based on information released by regulatory bodies including the CDC and federal and state organizations. These policies and algorithms were followed during the patient's care in the ED.   Patient presents with acute on chronic low back pain and right lower quadrant abdominal tenderness.  Due to age and comorbidities, will need to  get a CT scan of the abdomen and pelvis to further evaluate.  Doubt acute vascular event such as AAA dissection or mesenteric ischemia.  Vital signs are reassuring, she is nontoxic.  ----------------------------------------- 11:22 AM on 05/18/2019 -----------------------------------------  CT negative for any acute findings.  Labs unremarkable, chronic neutropenia.  She does have diverticulosis in the right colon in the area of her mild tenderness, raising question of a early diverticulitis.  I will treat this with Flagyl for now and have her follow-up with primary care on 2 days.      ____________________________________________   FINAL CLINICAL IMPRESSION(S) / ED DIAGNOSES    Final diagnoses:  Chronic bilateral low back pain, unspecified whether sciatica present  Diverticulosis     ED Discharge Orders         Ordered    metroNIDAZOLE (FLAGYL) 500 MG tablet  3 times daily     05/18/19 1121          Portions of this note were generated with dragon dictation software. Dictation errors may occur despite best attempts at proofreading.   Carrie Mew, MD 05/18/19 1123

## 2019-05-18 NOTE — Discharge Instructions (Addendum)
Your lab tests and CT scan today were all okay.  You may be having a very mild diverticulosis flare, and the metronidazole should help.  Follow-up with your primary care doctor this coming week if symptoms are not resolved by Monday.

## 2019-05-19 LAB — URINE CULTURE

## 2019-05-23 ENCOUNTER — Telehealth: Payer: Self-pay

## 2019-05-23 NOTE — Telephone Encounter (Signed)
Dr Ancil Boozer wanted me to send this to you so you all can call her. Let her know when you call her

## 2019-05-23 NOTE — Telephone Encounter (Signed)
Pt.notified

## 2019-05-23 NOTE — Telephone Encounter (Signed)
Copied from [place] [id number]. Topic: Appointment Scheduling - Scheduling Inquiry for Clinic >> May 23, 2019  8:30 AM Scherrie Gerlach wrote:  Reason for CRM: daughter states pt would like to be seen by Dr Ancil Boozer today.  Pt went to ED on 8/01.  Pt given abx. (she believes the abx is making her worse) She states pt is not better, went back to bed this am. No fever, just the side pain, and nausea. Please advise if ok for appt and call the daughter [name]. >> May 23, 2019  8:47 AM Orvis Brill B wrote: Please advise

## 2019-05-28 ENCOUNTER — Ambulatory Visit: Payer: Medicare Other | Admitting: Family Medicine

## 2019-05-28 ENCOUNTER — Encounter: Payer: Self-pay | Admitting: Family Medicine

## 2019-05-28 ENCOUNTER — Other Ambulatory Visit: Payer: Self-pay

## 2019-05-28 VITALS — BP 166/82 | HR 82 | Temp 97.3°F | Resp 16 | Ht 62.0 in | Wt 121.6 lb

## 2019-05-28 DIAGNOSIS — I129 Hypertensive chronic kidney disease with stage 1 through stage 4 chronic kidney disease, or unspecified chronic kidney disease: Secondary | ICD-10-CM

## 2019-05-28 DIAGNOSIS — M545 Low back pain: Secondary | ICD-10-CM

## 2019-05-28 DIAGNOSIS — N183 Chronic kidney disease, stage 3 unspecified: Secondary | ICD-10-CM

## 2019-05-28 DIAGNOSIS — M792 Neuralgia and neuritis, unspecified: Secondary | ICD-10-CM | POA: Diagnosis not present

## 2019-05-28 DIAGNOSIS — K5909 Other constipation: Secondary | ICD-10-CM | POA: Diagnosis not present

## 2019-05-28 DIAGNOSIS — G8929 Other chronic pain: Secondary | ICD-10-CM

## 2019-05-28 MED ORDER — PREGABALIN 75 MG PO CAPS: 75.0000 mg | ORAL_CAPSULE | Freq: Two times a day (BID) | ORAL | 0 refills | Status: DC

## 2019-05-28 MED ORDER — LUBIPROSTONE 8 MCG PO CAPS: 8.0000 ug | ORAL_CAPSULE | Freq: Two times a day (BID) | ORAL | 2 refills | Status: DC

## 2019-05-28 MED ORDER — TRAMADOL HCL 50 MG PO TABS: 50.0000 mg | ORAL_TABLET | Freq: Three times a day (TID) | ORAL | 0 refills | Status: AC | PRN

## 2019-05-28 NOTE — Progress Notes (Signed)
Name: Lori Pacheco   MRN: [id number]    DOB: 12-25-1920   Date:05/28/2019       Progress Note  Subjective  Chief Complaint  Chief Complaint  Patient presents with  . Hypertension  . Depression  . Anxiety    HPI  Recurrent Right Flank pain: Lori Pacheco has been having recurrent episodes of flank pain, she also has chronic back pain, she describes the pain and something growing and crawling inside of her and sometimes down her legs. At times she is in a lot of distress, and daughter recently took her to [facility]. CT abdomen negative for acute problems. She responded to gabapentin in the past however patient did not like side effects, explained likely neuropathic pain and chronic constipation, we will treat both, we will try Lyrica, explained possible side effects. She denies fever, no nausea or vomiting, but has intermittent abdominal pain, no rashes, no blood in stools   Patient Active Problem List   Diagnosis Date Noted  . Cognitive dysfunction 12/25/2018  . Varicose veins of leg with pain, bilateral 12/04/2018  . Deep vein thrombosis (DVT) of femoral vein of left lower extremity ([place]) 10/17/2018  . Other constipation 04/23/2018  . Leg cramps 04/23/2018  . Secondary renal hyperparathyroidism ([place]) 05/15/2017  . Atherosclerosis of abdominal aorta ([place]) 11/10/2016  . Perennial allergic rhinitis with seasonal variation 11/10/2016  . Vitamin D deficiency 03/29/2016  . Hypothyroidism 09/18/2015  . Essential hypertension 05/19/2015  . Hyperlipemia 05/19/2015  . Chronic kidney disease, stage IV (severe) ([place]) 05/19/2015    Past Surgical History:  Procedure Laterality Date  . NO PAST SURGERIES      Family History  Problem Relation Age of Onset  . Healthy Mother   . Healthy Father   . Heart disease Brother   . Cancer Brother   . Cancer Daughter   . Alcohol abuse Son   . Diabetes Son   . Heart disease Son     Social History   Socioeconomic History  . Marital status: Widowed     Spouse name: Lori Pacheco  . Number of children: 56  . Years of education: Not on file  . Highest education level: 3rd grade  Occupational History  . Occupation: Retired  Scientific laboratory technician  . Financial resource strain: Not hard at all  . Food insecurity    Worry: Never true    Inability: Never true  . Transportation needs    Medical: No    Non-medical: No  Tobacco Use  . Smoking status: Never Smoker  . Smokeless tobacco: Former Systems developer    Types: Snuff  . Tobacco comment: smoking cessation materials not required  Substance and Sexual Activity  . Alcohol use: No    Alcohol/week: 0.0 standard drinks  . Drug use: No  . Sexual activity: Not Currently  Lifestyle  . Physical activity    Days per week: 7 days    Minutes per session: 60 min  . Stress: To some extent  Relationships  . Social connections    Talks on phone: More than three times a week    Gets together: Twice a week    Attends religious service: More than 4 times per year    Active member of club or organization: No    Attends meetings of clubs or organizations: Never    Relationship status: Widowed  . Intimate partner violence    Fear of current or ex partner: No    Emotionally abused: No    Physically  abused: No    Forced sexual activity: No  Other Topics Concern  . Not on file  Social History Narrative         Current Outpatient Medications:  .  acetaminophen (TYLENOL) 500 MG tablet, Take 1 tablet (500 mg total) by mouth every 6 (six) hours as needed., Disp: 100 tablet, Rfl: 0 .  amLODipine (NORVASC) 10 MG tablet, Take 1 tablet (10 mg total) by mouth daily., Disp: 30 tablet, Rfl: 2 .  Blood Glucose-BP Monitor (BLOOD GLUCOSE-WRIST BP MONITOR) DEVI, 1 each by Does not apply route daily., Disp: 1 each, Rfl: 0 .  hydrALAZINE (APRESOLINE) 10 MG tablet, Take 1 tablet (10 mg total) by mouth every 6 (six) hours as needed. IF BP IS ABOVE 160/90, Disp: 30 tablet, Rfl: 1 .  hydrOXYzine (ATARAX/VISTARIL) 10 MG tablet, Take 1 tablet  (10 mg total) by mouth 3 (three) times daily as needed for itching or anxiety., Disp: 90 tablet, Rfl: 0 .  levothyroxine (SYNTHROID, LEVOTHROID) 112 MCG tablet, TAKE 1 TABLET BY MOUTH ONCE DAILY BEFORE BREAKFAST, Disp: 90 tablet, Rfl: 0 .  lisinopril (ZESTRIL) 20 MG tablet, Take 1 tablet (20 mg total) by mouth daily., Disp: 30 tablet, Rfl: 5 .  Vitamin D, Ergocalciferol, (DRISDOL) 1.25 MG (50000 UT) CAPS capsule, Take 1 capsule (50,000 Units total) by mouth once a week., Disp: 12 capsule, Rfl: 1  No Known Allergies  I personally reviewed active problem list, medication list, allergies, family history, social history with the patient/caregiver today.   ROS  Ten systems reviewed and is negative except as mentioned in HPI   Objective  Vitals:   05/28/19 1525  BP: (!) 160/90  Pulse: 82  Resp: 16  Temp: (!) 97.3 F (36.3 C)  TempSrc: Temporal  SpO2: 98%  Weight: 121 lb 9.6 oz (55.2 kg)  Height: 5\' 2"  (1.575 m)    Body mass index is 22.24 kg/m.  Physical Exam  Constitutional: Patient appears well-developed and thin. No distress.  HEENT: head atraumatic, normocephalic, pupils equal and reactive to light,  neck supple Cardiovascular: Normal rate, regular rhythm and normal heart sounds.  No murmur heard. No BLE edema. Pulmonary/Chest: Effort normal and breath sounds normal. No respiratory distress. Abdominal: Soft.  There is abdominal pain to palpation, worse when walking , also some with palpation, negative CVA tenderness Psychiatric: Patient has a normal mood and affect.   Recent Results (from the past 2160 hour(s))  TSH     Status: None   Collection Time: 04/15/19  3:07 PM  Result Value Ref Range   TSH 4.07 0.40 - 4.50 mIU/L  Comprehensive metabolic panel     Status: Abnormal   Collection Time: 05/18/19  9:10 AM  Result Value Ref Range   Sodium 141 135 - 145 mmol/L   Potassium 4.3 3.5 - 5.1 mmol/L   Chloride 108 98 - 111 mmol/L   CO2 25 22 - 32 mmol/L   Glucose, Bld 108  (H) 70 - 99 mg/dL   BUN 33 (H) 8 - 23 mg/dL   Creatinine, Ser 1.43 (H) 0.44 - 1.00 mg/dL   Calcium 9.0 8.9 - 10.3 mg/dL   Total Protein 6.9 6.5 - 8.1 g/dL   Albumin 3.8 3.5 - 5.0 g/dL   AST 16 15 - 41 U/L   ALT 10 0 - 44 U/L   Alkaline Phosphatase 55 38 - 126 U/L   Total Bilirubin 0.7 0.3 - 1.2 mg/dL   GFR calc non Af Amer 31 (L) >  60 mL/min   GFR calc Af Amer 36 (L) >60 mL/min   Anion gap 8 5 - 15    Comment: Performed at [facility], [address] [place], [place] [address]  Lipase, blood     Status: None   Collection Time: 05/18/19  9:10 AM  Result Value Ref Range   Lipase 40 11 - 51 U/L    Comment: Performed at [facility] [address] [place], [place] [address]  CBC with Differential     Status: Abnormal   Collection Time: 05/18/19  9:10 AM  Result Value Ref Range   WBC 2.6 (L) 4.0 - 10.5 K/uL   RBC 3.94 3.87 - 5.11 MIL/uL   Hemoglobin 11.4 (L) 12.0 - 15.0 g/dL   HCT 35.6 (L) 36.0 - 46.0 %   MCV 90.4 80.0 - 100.0 fL   MCH 28.9 26.0 - 34.0 pg   MCHC 32.0 30.0 - 36.0 g/dL   RDW 13.6 11.5 - 15.5 %   Platelets 128 (L) 150 - 400 K/uL   nRBC 0.0 0.0 - 0.2 %   Neutrophils Relative % 54 %   Neutro Abs 1.4 (L) 1.7 - 7.7 K/uL   Lymphocytes Relative 35 %   Lymphs Abs 0.9 0.7 - 4.0 K/uL   Monocytes Relative 8 %   Monocytes Absolute 0.2 0.1 - 1.0 K/uL   Eosinophils Relative 2 %   Eosinophils Absolute 0.0 0.0 - 0.5 K/uL   Basophils Relative 1 %   Basophils Absolute 0.0 0.0 - 0.1 K/uL   Immature Granulocytes 0 %   Abs Immature Granulocytes 0.01 0.00 - 0.07 K/uL    Comment: Performed at [facility], [address] [place], [place] [address]  Urinalysis, Complete w Microscopic     Status: Abnormal   Collection Time: 05/18/19 10:45 AM  Result Value Ref Range   Color, Urine STRAW (A) YELLOW   APPearance CLEAR (A) CLEAR   Specific Gravity, Urine 1.019 1.005 - 1.030   pH 7.0 5.0 - 8.0   Glucose, UA NEGATIVE NEGATIVE mg/dL   Hgb urine  dipstick SMALL (A) NEGATIVE   Bilirubin Urine NEGATIVE NEGATIVE   Ketones, ur NEGATIVE NEGATIVE mg/dL   Protein, ur NEGATIVE NEGATIVE mg/dL   Nitrite NEGATIVE NEGATIVE   Leukocytes,Ua SMALL (A) NEGATIVE   RBC / HPF 6-10 0 - 5 RBC/hpf   WBC, UA 0-5 0 - 5 WBC/hpf   Bacteria, UA NONE SEEN NONE SEEN   Squamous Epithelial / LPF 0-5 0 - 5   Mucus PRESENT     Comment: Performed at [facility], [address], [place], [place] [address]  Urine culture     Status: Abnormal   Collection Time: 05/18/19 10:45 AM   Specimen: Urine, Random  Result Value Ref Range   Specimen Description      URINE, RANDOM Performed at [facility], [address] [place], [place] [address]    Special Requests      NONE Performed at [facility], [address] [place], [place] [address]    Culture MULTIPLE SPECIES PRESENT, SUGGEST RECOLLECTION (A)    Report Status 05/19/2019 FINAL       PHQ2/9: Depression screen [facility] 2/9 05/28/2019 04/15/2019 12/25/2018 12/25/2018 11/22/2018  Decreased Interest 0 1 0 0 0  Down, Depressed, Hopeless - 1 0 0 0  PHQ - 2 Score 0 2 0 0 0  Altered sleeping 3 1 0 - -  Tired, decreased energy 0  1 0 - -  Change in appetite 3 1 0 - -  Feeling bad or failure about yourself  1 0 0 - -  Trouble concentrating 0 0 0 - -  Moving slowly or fidgety/restless 0 0 0 - -  Suicidal thoughts 0 1 0 - -  PHQ-9 Score 7 6 0 - -  Difficult doing work/chores Not difficult at all Somewhat difficult Not difficult at all - -  Some recent data might be hidden    phq 9 is positive   Fall Risk: Fall Risk  05/28/2019 04/15/2019 12/25/2018 11/22/2018 10/16/2018  Falls in the past year? 0 0 1 1 1   Comment - - - - -  Number falls in past yr: 0 0 1 1 1   Injury with Fall? 0 0 0 0 0  Risk Factor Category  - - - - -  Comment - - - - -  Risk for fall due to : - - History of fall(s);Impaired balance/gait - Other (Comment)  Risk for fall due to: Comment - - - - Gets in a hurry   Follow up - - Falls prevention discussed - -     Functional Status Survey: Is the patient deaf or have difficulty hearing?: No Does the patient have difficulty seeing, even when wearing glasses/contacts?: Yes Does the patient have difficulty concentrating, remembering, or making decisions?: No Does the patient have difficulty walking or climbing stairs?: Yes Does the patient have difficulty dressing or bathing?: No Does the patient have difficulty doing errands alone such as visiting a doctor's office or shopping?: Yes    Assessment & Plan  1. Chronic constipation  - lubiprostone (AMITIZA) 8 MCG capsule; Take 1 capsule (8 mcg total) by mouth 2 (two) times daily with a meal.  Dispense: 60 capsule; Refill: 2  2. Chronic bilateral low back pain without sciatica  - pregabalin (LYRICA) 75 MG capsule; Take 1 capsule (75 mg total) by mouth 2 (two) times daily.  Dispense: 60 capsule; Refill: 0 - traMADol (ULTRAM) 50 MG tablet; Take 1 tablet (50 mg total) by mouth every 8 (eight) hours as needed for up to 5 days.  Dispense: 30 tablet; Refill: 0  3. Neuropathic pain of flank, right  - pregabalin (LYRICA) 75 MG capsule; Take 1 capsule (75 mg total) by mouth 2 (two) times daily.  Dispense: 60 capsule; Refill: 0 - traMADol (ULTRAM) 50 MG tablet; Take 1 tablet (50 mg total) by mouth every 8 (eight) hours as needed for up to 5 days.  Dispense: 30 tablet; Refill: 0

## 2019-06-14 ENCOUNTER — Ambulatory Visit (INDEPENDENT_AMBULATORY_CARE_PROVIDER_SITE_OTHER): Payer: Medicare Other | Admitting: Nurse Practitioner

## 2019-07-01 ENCOUNTER — Encounter: Payer: Self-pay | Admitting: Family Medicine

## 2019-07-01 ENCOUNTER — Ambulatory Visit: Payer: Medicare Other | Admitting: Family Medicine

## 2019-07-01 ENCOUNTER — Other Ambulatory Visit: Payer: Self-pay

## 2019-07-01 VITALS — BP 138/82 | HR 96 | Temp 96.9°F | Resp 16 | Ht 62.0 in | Wt 120.7 lb

## 2019-07-01 DIAGNOSIS — D61818 Other pancytopenia: Secondary | ICD-10-CM

## 2019-07-01 DIAGNOSIS — F320 Major depressive disorder, single episode, mild: Secondary | ICD-10-CM

## 2019-07-01 DIAGNOSIS — Z23 Encounter for immunization: Secondary | ICD-10-CM | POA: Diagnosis not present

## 2019-07-01 DIAGNOSIS — I83813 Varicose veins of bilateral lower extremities with pain: Secondary | ICD-10-CM

## 2019-07-01 DIAGNOSIS — I129 Hypertensive chronic kidney disease with stage 1 through stage 4 chronic kidney disease, or unspecified chronic kidney disease: Secondary | ICD-10-CM | POA: Diagnosis not present

## 2019-07-01 DIAGNOSIS — E039 Hypothyroidism, unspecified: Secondary | ICD-10-CM | POA: Diagnosis not present

## 2019-07-01 DIAGNOSIS — N183 Chronic kidney disease, stage 3 unspecified: Secondary | ICD-10-CM

## 2019-07-01 DIAGNOSIS — F09 Unspecified mental disorder due to known physiological condition: Secondary | ICD-10-CM

## 2019-07-01 DIAGNOSIS — K5909 Other constipation: Secondary | ICD-10-CM

## 2019-07-01 DIAGNOSIS — M792 Neuralgia and neuritis, unspecified: Secondary | ICD-10-CM

## 2019-07-01 DIAGNOSIS — I10 Essential (primary) hypertension: Secondary | ICD-10-CM

## 2019-07-01 MED ORDER — AMLODIPINE BESYLATE 10 MG PO TABS: 10.0000 mg | ORAL_TABLET | Freq: Every day | ORAL | 5 refills | Status: DC

## 2019-07-01 MED ORDER — LUBIPROSTONE 8 MCG PO CAPS: 8.0000 ug | ORAL_CAPSULE | Freq: Two times a day (BID) | ORAL | 5 refills | Status: DC

## 2019-07-01 NOTE — Progress Notes (Signed)
Name: Lori Pacheco   MRN: [id number]    DOB: 27-Jul-1921   Date:07/01/2019       Progress Note  Subjective  Chief Complaint  Chief Complaint  Patient presents with  . Back Pain    Follow up-Taking Tylenol for back pain relief. Hardly ever takes the Lyrica for pain.  . Constipation    Amitiza is helping her go regular and this medication doesn't make her feel bad or loopy.    HPI  Flank pain: intermittent from left side to abdomen, at times very bothersome, we tried lyrica, but patient felt groggy and stopped medication, states pain not as severe now, very seldom   Constipation: doing better on Amitiza, taking it twice daily, no blood in stools, no longer having to strain  Cognitive dysfunction:  stable, still able to cook, clean, bath on her own. She is not able to pay bills or take medications on her own.   Varicose veins: stable, but one spot very superficial and discuss risk of bleeding and to apply pressure if it happens  Pancytopenia: seeing by hematologist in the past and does not want to go back or seek therapy, she states she feels tired at times  Hypothyroidism: taking levothyroxine and last TSH was at goal, normal bowel movements, chronic dry skin, no dysphagia.   HTN/CKI taking medications, no side effects, no chest pain or palpitation . She refuses to see nephrologist , she has intermittent pruritus   Depression: used to take SSRI but stopped on her own, phq 9 is going up, discussed resuming medication but she would like to hold off for now    Patient Active Problem List   Diagnosis Date Noted  . Cognitive dysfunction 12/25/2018  . Varicose veins of leg with pain, bilateral 12/04/2018  . Deep vein thrombosis (DVT) of femoral vein of left lower extremity ([place]) 10/17/2018  . Other constipation 04/23/2018  . Leg cramps 04/23/2018  . Secondary renal hyperparathyroidism ([place]) 05/15/2017  . Atherosclerosis of abdominal aorta ([place]) 11/10/2016  . Perennial allergic  rhinitis with seasonal variation 11/10/2016  . Vitamin D deficiency 03/29/2016  . Hypothyroidism 09/18/2015  . Essential hypertension 05/19/2015  . Hyperlipemia 05/19/2015  . Chronic kidney disease, stage IV (severe) ([place]) 05/19/2015    Past Surgical History:  Procedure Laterality Date  . NO PAST SURGERIES      Family History  Problem Relation Age of Onset  . Healthy Mother   . Healthy Father   . Heart disease Brother   . Cancer Brother   . Cancer Daughter   . Alcohol abuse Son   . Diabetes Son   . Heart disease Son     Social History   Socioeconomic History  . Marital status: Widowed    Spouse name: Jeneen Rinks  . Number of children: 92  . Years of education: Not on file  . Highest education level: 3rd grade  Occupational History  . Occupation: Retired  Scientific laboratory technician  . Financial resource strain: Not hard at all  . Food insecurity    Worry: Never true    Inability: Never true  . Transportation needs    Medical: No    Non-medical: No  Tobacco Use  . Smoking status: Never Smoker  . Smokeless tobacco: Former Systems developer    Types: Snuff  . Tobacco comment: smoking cessation materials not required  Substance and Sexual Activity  . Alcohol use: No    Alcohol/week: 0.0 standard drinks  . Drug use: No  . Sexual  activity: Not Currently  Lifestyle  . Physical activity    Days per week: 7 days    Minutes per session: 60 min  . Stress: To some extent  Relationships  . Social connections    Talks on phone: More than three times a week    Gets together: Twice a week    Attends religious service: More than 4 times per year    Active member of club or organization: No    Attends meetings of clubs or organizations: Never    Relationship status: Widowed  . Intimate partner violence    Fear of current or ex partner: No    Emotionally abused: No    Physically abused: No    Forced sexual activity: No  Other Topics Concern  . Not on file  Social History Narrative          Current Outpatient Medications:  .  acetaminophen (TYLENOL) 500 MG tablet, Take 1 tablet (500 mg total) by mouth every 6 (six) hours as needed., Disp: 100 tablet, Rfl: 0 .  amLODipine (NORVASC) 10 MG tablet, Take 1 tablet (10 mg total) by mouth daily., Disp: 30 tablet, Rfl: 5 .  Blood Glucose-BP Monitor (BLOOD GLUCOSE-WRIST BP MONITOR) DEVI, 1 each by Does not apply route daily., Disp: 1 each, Rfl: 0 .  hydrALAZINE (APRESOLINE) 10 MG tablet, Take 1 tablet (10 mg total) by mouth every 6 (six) hours as needed. IF BP IS ABOVE 160/90, Disp: 30 tablet, Rfl: 1 .  hydrOXYzine (ATARAX/VISTARIL) 10 MG tablet, Take 1 tablet (10 mg total) by mouth 3 (three) times daily as needed for itching or anxiety., Disp: 90 tablet, Rfl: 0 .  levothyroxine (SYNTHROID, LEVOTHROID) 112 MCG tablet, TAKE 1 TABLET BY MOUTH ONCE DAILY BEFORE BREAKFAST, Disp: 90 tablet, Rfl: 0 .  lisinopril (ZESTRIL) 20 MG tablet, Take 1 tablet (20 mg total) by mouth daily., Disp: 30 tablet, Rfl: 5 .  lubiprostone (AMITIZA) 8 MCG capsule, Take 1 capsule (8 mcg total) by mouth 2 (two) times daily with a meal., Disp: 60 capsule, Rfl: 5 .  Vitamin D, Ergocalciferol, (DRISDOL) 1.25 MG (50000 UT) CAPS capsule, Take 1 capsule (50,000 Units total) by mouth once a week., Disp: 12 capsule, Rfl: 1  Allergies  Allergen Reactions  . Lyrica [Pregabalin]     " I felt groggy"    I personally reviewed active problem list, medication list, allergies, family history, social history, health maintenance with the patient/caregiver today.   ROS  Constitutional: Negative for fever or weight change.  Respiratory: Negative for cough and shortness of breath.   Cardiovascular: Negative for chest pain or palpitations.  Gastrointestinal: positive for intermittent  abdominal pain, no bowel changes - doing well on amitiza .  Musculoskeletal: Negative for gait problem or joint swelling.  Skin: Negative for rash.  Neurological: Negative for dizziness or  headache.  No other specific complaints in a complete review of systems (except as listed in HPI above).  Objective  Vitals:   07/01/19 1459  BP: 138/82  Pulse: 96  Resp: 16  Temp: (!) 96.9 F (36.1 C)  TempSrc: Temporal  SpO2: 95%  Weight: 120 lb 11.2 oz (54.7 kg)  Height: 5\' 2"  (1.575 m)    Body mass index is 22.08 kg/m.  Physical Exam  Constitutional: Patient appears well-developed and frail looking No distress.  HEENT: head atraumatic, normocephalic, pupils equal and reactive to ligh Cardiovascular: Normal rate, regular rhythm and normal heart sounds.  No murmur heard. No BLE  edema. Pulmonary/Chest: Effort normal and breath sounds normal. No respiratory distress. Abdominal: Soft.  There is no tenderness. Psychiatric: Patient has a normal mood and affect. behavior is normal. Cooperative    Recent Results (from the past 2160 hour(s))  TSH     Status: None   Collection Time: 04/15/19  3:07 PM  Result Value Ref Range   TSH 4.07 0.40 - 4.50 mIU/L  Comprehensive metabolic panel     Status: Abnormal   Collection Time: 05/18/19  9:10 AM  Result Value Ref Range   Sodium 141 135 - 145 mmol/L   Potassium 4.3 3.5 - 5.1 mmol/L   Chloride 108 98 - 111 mmol/L   CO2 25 22 - 32 mmol/L   Glucose, Bld 108 (H) 70 - 99 mg/dL   BUN 33 (H) 8 - 23 mg/dL   Creatinine, Ser 1.43 (H) 0.44 - 1.00 mg/dL   Calcium 9.0 8.9 - 10.3 mg/dL   Total Protein 6.9 6.5 - 8.1 g/dL   Albumin 3.8 3.5 - 5.0 g/dL   AST 16 15 - 41 U/L   ALT 10 0 - 44 U/L   Alkaline Phosphatase 55 38 - 126 U/L   Total Bilirubin 0.7 0.3 - 1.2 mg/dL   GFR calc non Af Amer 31 (L) >60 mL/min   GFR calc Af Amer 36 (L) >60 mL/min   Anion gap 8 5 - 15    Comment: Performed at [facility], [address] [place], [place] [address]  Lipase, blood     Status: None   Collection Time: 05/18/19  9:10 AM  Result Value Ref Range   Lipase 40 11 - 51 U/L    Comment: Performed at [facility], [address] [place], [place] [address]  CBC with Differential     Status: Abnormal   Collection Time: 05/18/19  9:10 AM  Result Value Ref Range   WBC 2.6 (L) 4.0 - 10.5 K/uL   RBC 3.94 3.87 - 5.11 MIL/uL   Hemoglobin 11.4 (L) 12.0 - 15.0 g/dL   HCT 35.6 (L) 36.0 - 46.0 %   MCV 90.4 80.0 - 100.0 fL   MCH 28.9 26.0 - 34.0 pg   MCHC 32.0 30.0 - 36.0 g/dL   RDW 13.6 11.5 - 15.5 %   Platelets 128 (L) 150 - 400 K/uL   nRBC 0.0 0.0 - 0.2 %   Neutrophils Relative % 54 %   Neutro Abs 1.4 (L) 1.7 - 7.7 K/uL   Lymphocytes Relative 35 %   Lymphs Abs 0.9 0.7 - 4.0 K/uL   Monocytes Relative 8 %   Monocytes Absolute 0.2 0.1 - 1.0 K/uL   Eosinophils Relative 2 %   Eosinophils Absolute 0.0 0.0 - 0.5 K/uL   Basophils Relative 1 %   Basophils Absolute 0.0 0.0 - 0.1 K/uL   Immature Granulocytes 0 %   Abs Immature Granulocytes 0.01 0.00 - 0.07 K/uL    Comment: Performed at [facility], [address] [place], [place] [address]  Urinalysis, Complete w Microscopic     Status: Abnormal   Collection Time: 05/18/19 10:45 AM  Result Value Ref Range   Color, Urine STRAW (A) YELLOW   APPearance CLEAR (A) CLEAR   Specific Gravity, Urine 1.019 1.005 - 1.030   pH 7.0 5.0 - 8.0   Glucose, UA NEGATIVE NEGATIVE mg/dL   Hgb urine dipstick SMALL (A) NEGATIVE   Bilirubin Urine NEGATIVE NEGATIVE   Ketones, ur NEGATIVE NEGATIVE mg/dL  Protein, ur NEGATIVE NEGATIVE mg/dL   Nitrite NEGATIVE NEGATIVE   Leukocytes,Ua SMALL (A) NEGATIVE   RBC / HPF 6-10 0 - 5 RBC/hpf   WBC, UA 0-5 0 - 5 WBC/hpf   Bacteria, UA NONE SEEN NONE SEEN   Squamous Epithelial / LPF 0-5 0 - 5   Mucus PRESENT     Comment: Performed at [facility], [address] [place], [place] [address]  Urine culture     Status: Abnormal   Collection Time: 05/18/19 10:45 AM   Specimen: Urine, Random  Result Value Ref Range   Specimen Description      URINE, RANDOM Performed at [facility], [address], [place], [place]  [address]    Special Requests      NONE Performed at [facility], [address] [place],  [address]    Culture MULTIPLE SPECIES PRESENT, SUGGEST RECOLLECTION (A)    Report Status 05/19/2019 FINAL      PHQ2/9: Depression screen [facility] 2/9 07/01/2019 05/28/2019 04/15/2019 12/25/2018 12/25/2018  Decreased Interest 2 0 1 0 0  Down, Depressed, Hopeless 2 - 1 0 0  PHQ - 2 Score 4 0 2 0 0  Altered sleeping 2 3 1  0 -  Tired, decreased energy 2 0 1 0 -  Change in appetite 3 3 1  0 -  Feeling bad or failure about yourself  3 1 0 0 -  Trouble concentrating 0 0 0 0 -  Moving slowly or fidgety/restless 0 0 0 0 -  Suicidal thoughts 0 0 1 0 -  PHQ-9 Score 14 7 6  0 -  Difficult doing work/chores Somewhat difficult Not difficult at all Somewhat difficult Not difficult at all -  Some recent data might be hidden    phq 9 is positive   Fall Risk: Fall Risk  07/01/2019 05/28/2019 04/15/2019 12/25/2018 11/22/2018  Falls in the past year? 0 0 0 1 1  Comment - - - - -  Number falls in past yr: 0 0 0 1 1  Injury with Fall? 0 0 0 0 0  Risk Factor Category  - - - - -  Comment - - - - -  Risk for fall due to : - - - History of fall(s);Impaired balance/gait -  Risk for fall due to: Comment - - - - -  Follow up - - - Falls prevention discussed -     Functional Status Survey: Is the patient deaf or have difficulty hearing?: No Does the patient have difficulty seeing, even when wearing glasses/contacts?: Yes Does the patient have difficulty concentrating, remembering, or making decisions?: No Does the patient have difficulty walking or climbing stairs?: Yes Does the patient have difficulty dressing or bathing?: No Does the patient have difficulty doing errands alone such as visiting a doctor's office or shopping?: Yes    Assessment & Plan   1. Benign hypertension with chronic kidney disease, stage III (HCC)  - amLODipine (NORVASC) 10 MG tablet; Take 1 tablet (10 mg total) by mouth daily.   Dispense: 30 tablet; Refill: 5  2. Neuropathic pain of flank, right  Unable to table Lyrica, it made her feel loopy   3. Need for immunization against influenza  - Flu Vaccine QUAD High Dose(Fluad)  4. Hypothyroidism, unspecified type  Last TSH at goal   5. Chronic kidney disease, stage III (moderate) (HCC)  Refuses to see nephrologist   6. Essential hypertension  bp is at goal today  - amLODipine (  NORVASC) 10 MG tablet; Take 1 tablet (10 mg total) by mouth daily.  Dispense: 30 tablet; Refill: 5  7. Acquired hypothyroidism  Last TSH was at goal   8. Varicose veins of both lower extremities with pain   9. Cognitive dysfunction  stable  10. Pancytopenia [facility])  Patient and daughter in the office today, explained again labs, patient does not want to see another doctors  12. Mild major depression ([place])  Refuses medication at this time  13. Chronic constipation  Doing well on medication  - lubiprostone (AMITIZA) 8 MCG capsule; Take 1 capsule (8 mcg total) by mouth 2 (two) times daily with a meal.  Dispense: 60 capsule; Refill: 5

## 2019-07-17 ENCOUNTER — Other Ambulatory Visit: Payer: Self-pay

## 2019-07-17 DIAGNOSIS — E039 Hypothyroidism, unspecified: Secondary | ICD-10-CM

## 2019-07-17 DIAGNOSIS — N183 Chronic kidney disease, stage 3 unspecified: Secondary | ICD-10-CM

## 2019-07-17 DIAGNOSIS — N2581 Secondary hyperparathyroidism of renal origin: Secondary | ICD-10-CM

## 2019-07-17 DIAGNOSIS — I129 Hypertensive chronic kidney disease with stage 1 through stage 4 chronic kidney disease, or unspecified chronic kidney disease: Secondary | ICD-10-CM

## 2019-07-17 DIAGNOSIS — I10 Essential (primary) hypertension: Secondary | ICD-10-CM

## 2019-07-17 MED ORDER — LEVOTHYROXINE SODIUM 112 MCG PO TABS: ORAL_TABLET | ORAL | 0 refills | Status: DC

## 2019-07-17 MED ORDER — AMLODIPINE BESYLATE 10 MG PO TABS: 10.0000 mg | ORAL_TABLET | Freq: Every day | ORAL | 1 refills | Status: DC

## 2019-07-17 MED ORDER — VITAMIN D (ERGOCALCIFEROL) 1.25 MG (50000 UNIT) PO CAPS: 50000.0000 [IU] | ORAL_CAPSULE | ORAL | 1 refills | Status: DC

## 2019-07-17 MED ORDER — LISINOPRIL 20 MG PO TABS: 20.0000 mg | ORAL_TABLET | Freq: Every day | ORAL | 1 refills | Status: DC

## 2019-09-04 ENCOUNTER — Other Ambulatory Visit: Payer: Self-pay | Admitting: Family Medicine

## 2019-09-04 DIAGNOSIS — E039 Hypothyroidism, unspecified: Secondary | ICD-10-CM

## 2019-09-06 ENCOUNTER — Ambulatory Visit: Payer: Self-pay | Admitting: *Deleted

## 2019-09-06 NOTE — Telephone Encounter (Signed)
Call daughter back- she has not been over to check on mother and wants recommendation for what to use- advised her without knowing the reason for itching- it would be hard to advise.

## 2019-09-06 NOTE — Telephone Encounter (Signed)
Daughter states pt is having severe itching on her right side. It has been going on a couple of days. She is on the way to the pt's house now. She is not sure what is going on, but would like a call back and she should be with the pt by the time you call. She asked to speak with nurse concerning this. She wants to know if she should take her to ED?   Call to daughter- [name]. Daughter asked if she can call back in a few minutes.

## 2019-11-01 ENCOUNTER — Encounter: Payer: Self-pay | Admitting: Family Medicine

## 2019-11-01 ENCOUNTER — Ambulatory Visit: Payer: Medicare PPO | Admitting: Family Medicine

## 2019-11-01 ENCOUNTER — Other Ambulatory Visit: Payer: Self-pay

## 2019-11-01 VITALS — BP 136/70 | HR 84 | Temp 97.5°F | Resp 16 | Ht 62.0 in | Wt 122.5 lb

## 2019-11-01 DIAGNOSIS — D61818 Other pancytopenia: Secondary | ICD-10-CM

## 2019-11-01 DIAGNOSIS — M545 Low back pain: Secondary | ICD-10-CM | POA: Diagnosis not present

## 2019-11-01 DIAGNOSIS — M792 Neuralgia and neuritis, unspecified: Secondary | ICD-10-CM

## 2019-11-01 DIAGNOSIS — F320 Major depressive disorder, single episode, mild: Secondary | ICD-10-CM | POA: Diagnosis not present

## 2019-11-01 DIAGNOSIS — N183 Chronic kidney disease, stage 3 unspecified: Secondary | ICD-10-CM

## 2019-11-01 DIAGNOSIS — N1832 Chronic kidney disease, stage 3b: Secondary | ICD-10-CM

## 2019-11-01 DIAGNOSIS — I700 Atherosclerosis of aorta: Secondary | ICD-10-CM

## 2019-11-01 DIAGNOSIS — E782 Mixed hyperlipidemia: Secondary | ICD-10-CM

## 2019-11-01 DIAGNOSIS — N2581 Secondary hyperparathyroidism of renal origin: Secondary | ICD-10-CM

## 2019-11-01 DIAGNOSIS — K5909 Other constipation: Secondary | ICD-10-CM

## 2019-11-01 DIAGNOSIS — K219 Gastro-esophageal reflux disease without esophagitis: Secondary | ICD-10-CM | POA: Diagnosis not present

## 2019-11-01 DIAGNOSIS — I129 Hypertensive chronic kidney disease with stage 1 through stage 4 chronic kidney disease, or unspecified chronic kidney disease: Secondary | ICD-10-CM

## 2019-11-01 DIAGNOSIS — I10 Essential (primary) hypertension: Secondary | ICD-10-CM

## 2019-11-01 DIAGNOSIS — E039 Hypothyroidism, unspecified: Secondary | ICD-10-CM

## 2019-11-01 DIAGNOSIS — F09 Unspecified mental disorder due to known physiological condition: Secondary | ICD-10-CM | POA: Diagnosis not present

## 2019-11-01 DIAGNOSIS — G8929 Other chronic pain: Secondary | ICD-10-CM

## 2019-11-01 MED ORDER — LIDOCAINE 5 % EX PTCH: 2.0000 | MEDICATED_PATCH | CUTANEOUS | 0 refills | Status: DC

## 2019-11-01 MED ORDER — LUBIPROSTONE 8 MCG PO CAPS: 8.0000 ug | ORAL_CAPSULE | Freq: Two times a day (BID) | ORAL | 0 refills | Status: DC

## 2019-11-01 MED ORDER — AMLODIPINE BESYLATE 10 MG PO TABS: 10.0000 mg | ORAL_TABLET | Freq: Every day | ORAL | 1 refills | Status: DC

## 2019-11-01 MED ORDER — LISINOPRIL 20 MG PO TABS: 20.0000 mg | ORAL_TABLET | Freq: Every day | ORAL | 1 refills | Status: DC

## 2019-11-01 NOTE — Progress Notes (Signed)
Name: Lori Pacheco   MRN: [id number]    DOB: 1921/04/07   Date:11/01/2019       Progress Note  Subjective  Chief Complaint  Chief Complaint  Patient presents with  . Medication Refill  . Hypertension  . Constipation  . Cognitive dysfunction  . Hypothyroidism  . Depression    HPI  Flank pain: intermittent from right  side to abdomen, at times very bothersome, we tried lyrica, but patient felt groggy and stopped medication, it was helping with pain. She states pain is getting bothersome again, we will try lidocaine patches and see if it helps   Constipation: out of Amitiza and has been taking milk of magnesia daily to have a bowel movements, but she prefers amitiza and we will send a refill, no blood in stools, no straining  Cognitive dysfunction:  stable, still able to cook, clean, bath on her own. She is not able to pay bills or take medications on her own. She lives with one of her daughters.   Varicose veins: stable, but one spot very superficial and discuss risk of bleeding and to apply pressure if it happens  Pancytopenia: seeing by hematologist in the past and does not want to go back or seek therapy, she states she feels tired at times. No easy bruising or bleeding at this time   Hypothyroidism: taking levothyroxine and last TSH was at goal, chronic dry skin, no dysphagia. Her weight is stable, down 4 lbs since last year   HTN/CKI taking medications, no side effects, no chest pain or palpitation . She refuses to see nephrologist , she has intermittent pruritus We will recheck function, she is not taking nsaid's. She has secondary hyperparathyroidism , she has occasional heartburn   Depression: used to take SSRI but stopped on her own, phq 9 is going up, discussed resuming medication but she would like to hold off for now , daughter states once she takes hydroxyzine symptoms improves and is using it prn only    Patient Active Problem List   Diagnosis Date Noted  .  Cognitive dysfunction 12/25/2018  . Varicose veins of leg with pain, bilateral 12/04/2018  . Deep vein thrombosis (DVT) of femoral vein of left lower extremity ([place]) 10/17/2018  . Other constipation 04/23/2018  . Leg cramps 04/23/2018  . Secondary renal hyperparathyroidism ([place]) 05/15/2017  . Atherosclerosis of abdominal aorta ([place]) 11/10/2016  . Perennial allergic rhinitis with seasonal variation 11/10/2016  . Vitamin D deficiency 03/29/2016  . Hypothyroidism 09/18/2015  . Essential hypertension 05/19/2015  . Hyperlipemia 05/19/2015  . Chronic kidney disease, stage IV (severe) ([place]) 05/19/2015    Past Surgical History:  Procedure Laterality Date  . NO PAST SURGERIES      Family History  Problem Relation Age of Onset  . Healthy Mother   . Healthy Father   . Heart disease Brother   . Cancer Brother   . Cancer Daughter   . Alcohol abuse Son   . Diabetes Son   . Heart disease Son      Current Outpatient Medications:  .  acetaminophen (TYLENOL) 500 MG tablet, Take 1 tablet (500 mg total) by mouth every 6 (six) hours as needed., Disp: 100 tablet, Rfl: 0 .  amLODipine (NORVASC) 10 MG tablet, Take 1 tablet (10 mg total) by mouth daily., Disp: 90 tablet, Rfl: 1 .  Blood Glucose-BP Monitor (BLOOD GLUCOSE-WRIST BP MONITOR) DEVI, 1 each by Does not apply route daily., Disp: 1 each, Rfl: 0 .  hydrALAZINE (  APRESOLINE) 10 MG tablet, Take 1 tablet (10 mg total) by mouth every 6 (six) hours as needed. IF BP IS ABOVE 160/90, Disp: 30 tablet, Rfl: 1 .  hydrOXYzine (ATARAX/VISTARIL) 10 MG tablet, Take 1 tablet (10 mg total) by mouth 3 (three) times daily as needed for itching or anxiety., Disp: 90 tablet, Rfl: 0 .  levothyroxine (SYNTHROID) 112 MCG tablet, TAKE 1 TABLET BY MOUTH ONCE DAILY BEFORE BREAKFAST, Disp: 90 tablet, Rfl: 0 .  lisinopril (ZESTRIL) 20 MG tablet, Take 1 tablet (20 mg total) by mouth daily., Disp: 90 tablet, Rfl: 1 .  lubiprostone (AMITIZA) 8 MCG capsule, Take 1 capsule (8  mcg total) by mouth 2 (two) times daily with a meal., Disp: 60 capsule, Rfl: 5 .  Vitamin D, Ergocalciferol, (DRISDOL) 1.25 MG (50000 UT) CAPS capsule, Take 1 capsule (50,000 Units total) by mouth once a week., Disp: 12 capsule, Rfl: 1  Allergies  Allergen Reactions  . Lyrica [Pregabalin]     " I felt groggy"    I personally reviewed active problem list, medication list, allergies, family history, social history with the patient/caregiver today.   ROS  Constitutional: Negative for fever or weight change.  Respiratory: Negative for cough and shortness of breath.   Cardiovascular: Negative for chest pain or palpitations.  Gastrointestinal: Negative for abdominal pain, no bowel changes.  Musculoskeletal: Negative for gait problem or joint swelling.  Skin: Negative for rash.  Neurological: Negative for dizziness or headache.  No other specific complaints in a complete review of systems (except as listed in HPI above).  Objective  Vitals:   11/01/19 1314  Pulse: 84  Resp: 16  Temp: (!) 97.5 F (36.4 C)  TempSrc: Temporal  SpO2: 98%  Weight: 122 lb 8 oz (55.6 kg)  Height: 5\' 2"  (1.575 m)    Body mass index is 22.41 kg/m.  Physical Exam  Constitutional: Patient appears thin but in no distress.  HEENT: head atraumatic, normocephalic, pupils equal and reactive to light, Cardiovascular: Normal rate, regular rhythm and normal heart sounds.  No murmur heard. No BLE edema. Pulmonary/Chest: Effort normal and breath sounds normal. No respiratory distress. Abdominal: Soft.  There is no tenderness. Psychiatric: Patient has a normal mood and affect. behavior is normal. Judgment and thought content normal.  PHQ2/9: Depression screen [facility] 2/9 11/01/2019 07/01/2019 05/28/2019 04/15/2019 12/25/2018  Decreased Interest 0 2 0 1 0  Down, Depressed, Hopeless 3 2 - 1 0  PHQ - 2 Score 3 4 0 2 0  Altered sleeping 2 2 3 1  0  Tired, decreased energy 3 2 0 1 0  Change in appetite 3 3 3 1  0   Feeling bad or failure about yourself  2 3 1  0 0  Trouble concentrating 1 0 0 0 0  Moving slowly or fidgety/restless 1 0 0 0 0  Suicidal thoughts 0 0 0 1 0  PHQ-9 Score 15 14 7 6  0  Difficult doing work/chores Somewhat difficult Somewhat difficult Not difficult at all Somewhat difficult Not difficult at all  Some recent data might be hidden    phq 9 is positive   Fall Risk: Fall Risk  11/01/2019 07/01/2019 05/28/2019 04/15/2019 12/25/2018  Falls in the past year? 1 0 0 0 1  Comment - - - - -  Number falls in past yr: 0 0 0 0 1  Injury with Fall? 0 0 0 0 0  Risk Factor Category  - - - - -  Comment - - - - -  Risk for fall due to : History of fall(s);Impaired balance/gait - - - History of fall(s);Impaired balance/gait  Risk for fall due to: Comment - - - - -  Follow up - - - - Falls prevention discussed     Functional Status Survey: Is the patient deaf or have difficulty hearing?: No Does the patient have difficulty seeing, even when wearing glasses/contacts?: No Does the patient have difficulty concentrating, remembering, or making decisions?: No Does the patient have difficulty walking or climbing stairs?: Yes Does the patient have difficulty dressing or bathing?: No Does the patient have difficulty doing errands alone such as visiting a doctor's office or shopping?: Yes    Assessment & Plan  1. Acquired hypothyroidism  - TSH  2. Neuropathic pain of flank, right  - lidocaine (LIDODERM) 5 %; Place 2 patches onto the skin every morning. And remove it at night  Dispense: 180 patch; Refill: 0  3. Cognitive dysfunction  stable  4. Mild major depression ([place])  Refuses to take daily medication   5. Pancytopenia ([facility])  - CBC with Differential/Platelet  6. Atherosclerosis of abdominal aorta ([facility])  No longer on statin  7. Secondary renal hyperparathyroidism ([place])  We will not recheck labs today since not seeing nephrologist   8. Chronic bilateral low back pain  without sciatica   9. Gastroesophageal reflux disease without esophagitis  stable  10. Mixed hyperlipidemia  Not on medication   11. Essential hypertension  - COMPLETE METABOLIC PANEL WITH GFR - amLODipine (NORVASC) 10 MG tablet; Take 1 tablet (10 mg total) by mouth daily.  Dispense: 90 tablet; Refill: 1 - lisinopril (ZESTRIL) 20 MG tablet; Take 1 tablet (20 mg total) by mouth daily.  Dispense: 90 tablet; Refill: 1  12. Stage 3b chronic kidney disease  - COMPLETE METABOLIC PANEL WITH GFR  13. Benign hypertension with chronic kidney disease, stage III  - amLODipine (NORVASC) 10 MG tablet; Take 1 tablet (10 mg total) by mouth daily.  Dispense: 90 tablet; Refill: 1 - lisinopril (ZESTRIL) 20 MG tablet; Take 1 tablet (20 mg total) by mouth daily.  Dispense: 90 tablet; Refill: 1  14. Chronic kidney disease, stage III (moderate)  - lisinopril (ZESTRIL) 20 MG tablet; Take 1 tablet (20 mg total) by mouth daily.  Dispense: 90 tablet; Refill: 1  15. Chronic constipation  - lubiprostone (AMITIZA) 8 MCG capsule; Take 1 capsule (8 mcg total) by mouth 2 (two) times daily with a meal.  Dispense: 180 capsule; Refill: 0

## 2019-11-02 LAB — COMPLETE METABOLIC PANEL WITH GFR
AG Ratio: 1.6 (calc) (ref 1.0–2.5)
ALT: 6 U/L (ref 6–29)
AST: 15 U/L (ref 10–35)
Albumin: 4.1 g/dL (ref 3.6–5.1)
Alkaline phosphatase (APISO): 56 U/L (ref 37–153)
BUN/Creatinine Ratio: 20 (calc) (ref 6–22)
BUN: 28 mg/dL — ABNORMAL HIGH (ref 7–25)
CO2: 27 mmol/L (ref 20–32)
Calcium: 9.4 mg/dL (ref 8.6–10.4)
Chloride: 103 mmol/L (ref 98–110)
Creat: 1.42 mg/dL — ABNORMAL HIGH (ref 0.60–0.88)
GFR, Est African American: 36 mL/min/{1.73_m2} — ABNORMAL LOW (ref >=60)
GFR, Est Non African American: 31 mL/min/{1.73_m2} — ABNORMAL LOW (ref >=60)
Globulin: 2.6 g/dL (calc) (ref 1.9–3.7)
Glucose, Bld: 89 mg/dL (ref 65–99)
Potassium: 5.2 mmol/L (ref 3.5–5.3)
Sodium: 139 mmol/L (ref 135–146)
Total Bilirubin: 0.5 mg/dL (ref 0.2–1.2)
Total Protein: 6.7 g/dL (ref 6.1–8.1)

## 2019-11-02 LAB — CBC WITH DIFFERENTIAL/PLATELET
Absolute Monocytes: 258 cells/uL (ref 200–950)
Basophils Absolute: 29 cells/uL (ref 0–200)
Basophils Relative: 1 %
Eosinophils Absolute: 20 cells/uL (ref 15–500)
Eosinophils Relative: 0.7 %
HCT: 34.3 % — ABNORMAL LOW (ref 35.0–45.0)
Hemoglobin: 10.9 g/dL — ABNORMAL LOW (ref 11.7–15.5)
Lymphs Abs: 1001 cells/uL (ref 850–3900)
MCH: 28.4 pg (ref 27.0–33.0)
MCHC: 31.8 g/dL — ABNORMAL LOW (ref 32.0–36.0)
MCV: 89.3 fL (ref 80.0–100.0)
MPV: 10.5 fL (ref 7.5–12.5)
Monocytes Relative: 8.9 %
Neutro Abs: 1592 cells/uL (ref 1500–7800)
Neutrophils Relative %: 54.9 %
Platelets: 141 10*3/uL (ref 140–400)
RBC: 3.84 10*6/uL (ref 3.80–5.10)
RDW: 12.8 % (ref 11.0–15.0)
Total Lymphocyte: 34.5 %
WBC: 2.9 10*3/uL — ABNORMAL LOW (ref 3.8–10.8)

## 2019-11-02 LAB — TSH: TSH: 0.14 mIU/L — ABNORMAL LOW (ref 0.40–4.50)

## 2019-11-15 ENCOUNTER — Other Ambulatory Visit: Payer: Self-pay

## 2019-11-15 DIAGNOSIS — E039 Hypothyroidism, unspecified: Secondary | ICD-10-CM

## 2019-11-15 DIAGNOSIS — I10 Essential (primary) hypertension: Secondary | ICD-10-CM

## 2019-11-15 DIAGNOSIS — K5909 Other constipation: Secondary | ICD-10-CM

## 2019-11-15 DIAGNOSIS — M792 Neuralgia and neuritis, unspecified: Secondary | ICD-10-CM

## 2019-11-15 DIAGNOSIS — I129 Hypertensive chronic kidney disease with stage 1 through stage 4 chronic kidney disease, or unspecified chronic kidney disease: Secondary | ICD-10-CM

## 2019-11-15 DIAGNOSIS — N183 Chronic kidney disease, stage 3 unspecified: Secondary | ICD-10-CM

## 2019-11-15 NOTE — Telephone Encounter (Signed)
Hypertension medication request: Amlodipine and Lisinopril to [facility]  Last office visit pertaining to hypertension: 11/01/2019  BP Readings from Last 3 Encounters:  11/01/19 136/70  07/01/19 138/82  05/28/19 (!) 166/82    Lab Results  Component Value Date   CREATININE 1.42 (H) 11/01/2019   BUN 28 (H) 11/01/2019   NA 139 11/01/2019   K 5.2 11/01/2019   CL 103 11/01/2019   CO2 27 11/01/2019    Refill request for thyroid medication. Levothyroxine   Lab Results  Component Value Date   TSH 0.14 (L) 11/01/2019   Follow up on 03/02/2020

## 2019-11-16 ENCOUNTER — Other Ambulatory Visit: Payer: Self-pay | Admitting: Family Medicine

## 2019-11-16 DIAGNOSIS — E039 Hypothyroidism, unspecified: Secondary | ICD-10-CM

## 2019-11-16 MED ORDER — LUBIPROSTONE 8 MCG PO CAPS: 8.0000 ug | ORAL_CAPSULE | Freq: Two times a day (BID) | ORAL | 0 refills | Status: DC

## 2019-11-16 MED ORDER — LIDOCAINE 5 % EX PTCH: 2.0000 | MEDICATED_PATCH | CUTANEOUS | 0 refills | Status: DC

## 2019-11-16 MED ORDER — AMLODIPINE BESYLATE 10 MG PO TABS: 10.0000 mg | ORAL_TABLET | Freq: Every day | ORAL | 1 refills | Status: DC

## 2019-11-16 MED ORDER — LISINOPRIL 20 MG PO TABS: 20.0000 mg | ORAL_TABLET | Freq: Every day | ORAL | 1 refills | Status: DC

## 2019-11-16 MED ORDER — LEVOTHYROXINE SODIUM 112 MCG PO TABS: ORAL_TABLET | ORAL | 0 refills | Status: DC

## 2019-11-21 ENCOUNTER — Telehealth: Payer: Self-pay

## 2019-11-21 NOTE — Telephone Encounter (Signed)
Lidocaine 5% patch approved, good until 10/16/2020. Pharmacy has been notified.

## 2019-11-21 NOTE — Telephone Encounter (Signed)
[facility] calling to follow up on the patch which I advised was approved.  However they also need PA for  lubiprostone (AMITIZA) 8 MCG capsule  pharmacist shellie states unfortunately, there is not an alternative.  cb : [phone]

## 2019-11-22 NOTE — Telephone Encounter (Signed)
I have already done a PA for Lori Pacheco 8 mcg and it was denied.

## 2019-11-25 ENCOUNTER — Telehealth: Payer: Self-pay

## 2019-11-25 NOTE — Telephone Encounter (Signed)
Insurance denied Amitiza due to Medicare Part D coverage but will cover [place].

## 2019-11-26 ENCOUNTER — Other Ambulatory Visit: Payer: Self-pay | Admitting: Family Medicine

## 2019-11-26 MED ORDER — LINACLOTIDE 72 MCG PO CAPS: 72.0000 ug | ORAL_CAPSULE | Freq: Every day | ORAL | 1 refills | Status: DC

## 2019-12-10 ENCOUNTER — Other Ambulatory Visit: Payer: Self-pay

## 2019-12-10 DIAGNOSIS — E039 Hypothyroidism, unspecified: Secondary | ICD-10-CM | POA: Diagnosis not present

## 2019-12-11 LAB — TSH: TSH: 2.8 mIU/L (ref 0.40–4.50)

## 2020-01-13 ENCOUNTER — Other Ambulatory Visit: Payer: Self-pay | Admitting: Family Medicine

## 2020-01-13 DIAGNOSIS — N2581 Secondary hyperparathyroidism of renal origin: Secondary | ICD-10-CM

## 2020-01-13 NOTE — Telephone Encounter (Signed)
Requested medication (s) are due for refill today: yes  Requested medication (s) are on the active medication list: yes  Last refill:  07/17/19  Future visit scheduled: yes  Notes to clinic:  Endocrinology:  Vitamins - vitamin D supplementation failed  Requested Prescriptions  Pending Prescriptions Disp Refills   Vitamin D, Ergocalciferol, (DRISDOL) 1.25 MG (50000 UNIT) CAPS capsule [Pharmacy Med Name: VITAMIN D 1.25 MG (50000 UT) Capsule] 12 capsule 1    Sig: TAKE 1 CAPSULE BY MOUTH  ONCE WEEKLY      Endocrinology:  Vitamins - Vitamin D Supplementation Failed - 01/13/2020 12:23 PM      Failed - 50,000 IU strengths are not delegated      Failed - Phosphate in normal range and within 360 days    No results found for: PHOS        Failed - Vitamin D in normal range and within 360 days    Vit D, 25-Hydroxy  Date Value Ref Range Status  05/22/2018 48 30 - 100 ng/mL Final    Comment:    Vitamin D Status         25-OH Vitamin D: . Deficiency:                    <20 ng/mL Insufficiency:             20 - 29 ng/mL Optimal:                 > or = 30 ng/mL . For 25-OH Vitamin D testing on patients on  D2-supplementation and patients for whom quantitation  of D2 and D3 fractions is required, the QuestAssureD(TM) 25-OH VIT D, (D2,D3), LC/MS/MS is recommended: order  code [id number] (patients >59yrs). . For more information on this test, go to: http://education.questdiagnostics.com/faq/FAQ163 (This link is being provided for  informational/educational purposes only.)           Passed - Ca in normal range and within 360 days    Calcium  Date Value Ref Range Status  11/01/2019 9.4 8.6 - 10.4 mg/dL Final   Calcium, Total  Date Value Ref Range Status  11/22/2014 8.9 8.5 - 10.1 mg/dL Final          Passed - Valid encounter within last 12 months    Recent Outpatient Visits           2 months ago Acquired hypothyroidism   [facility] [place], Drue Stager, MD   6  months ago Benign hypertension with chronic kidney disease, stage III [facility])   [facility] Steele Sizer, MD   7 months ago Chronic constipation   [facility] [place], Drue Stager, MD   9 months ago Benign hypertension with chronic kidney disease, stage III [facility])   [facility] Steele Sizer, MD   1 year ago Benign hypertension with chronic kidney disease, stage III [facility])   [facility] Steele Sizer, MD       Future Appointments             In 1 month Ancil Boozer, Drue Stager, MD [facility], [facility]

## 2020-01-17 DIAGNOSIS — N189 Chronic kidney disease, unspecified: Secondary | ICD-10-CM | POA: Diagnosis not present

## 2020-01-17 DIAGNOSIS — I440 Atrioventricular block, first degree: Secondary | ICD-10-CM | POA: Diagnosis not present

## 2020-01-17 DIAGNOSIS — G2581 Restless legs syndrome: Secondary | ICD-10-CM | POA: Diagnosis not present

## 2020-01-17 DIAGNOSIS — I824Z2 Acute embolism and thrombosis of unspecified deep veins of left distal lower extremity: Secondary | ICD-10-CM | POA: Diagnosis not present

## 2020-01-17 DIAGNOSIS — I2699 Other pulmonary embolism without acute cor pulmonale: Secondary | ICD-10-CM | POA: Diagnosis not present

## 2020-01-17 DIAGNOSIS — Z86718 Personal history of other venous thrombosis and embolism: Secondary | ICD-10-CM | POA: Diagnosis not present

## 2020-01-17 DIAGNOSIS — N289 Disorder of kidney and ureter, unspecified: Secondary | ICD-10-CM | POA: Diagnosis not present

## 2020-01-17 DIAGNOSIS — K8689 Other specified diseases of pancreas: Secondary | ICD-10-CM | POA: Diagnosis not present

## 2020-01-17 DIAGNOSIS — I129 Hypertensive chronic kidney disease with stage 1 through stage 4 chronic kidney disease, or unspecified chronic kidney disease: Secondary | ICD-10-CM | POA: Diagnosis not present

## 2020-01-17 DIAGNOSIS — I824Y2 Acute embolism and thrombosis of unspecified deep veins of left proximal lower extremity: Secondary | ICD-10-CM | POA: Diagnosis not present

## 2020-01-17 DIAGNOSIS — R1030 Lower abdominal pain, unspecified: Secondary | ICD-10-CM | POA: Diagnosis not present

## 2020-01-17 DIAGNOSIS — Z20822 Contact with and (suspected) exposure to covid-19: Secondary | ICD-10-CM | POA: Diagnosis not present

## 2020-01-17 DIAGNOSIS — N390 Urinary tract infection, site not specified: Secondary | ICD-10-CM | POA: Diagnosis not present

## 2020-01-17 DIAGNOSIS — R4182 Altered mental status, unspecified: Secondary | ICD-10-CM | POA: Diagnosis not present

## 2020-01-17 DIAGNOSIS — R188 Other ascites: Secondary | ICD-10-CM | POA: Diagnosis not present

## 2020-01-17 DIAGNOSIS — N184 Chronic kidney disease, stage 4 (severe): Secondary | ICD-10-CM | POA: Diagnosis not present

## 2020-01-17 DIAGNOSIS — E039 Hypothyroidism, unspecified: Secondary | ICD-10-CM | POA: Diagnosis not present

## 2020-01-17 DIAGNOSIS — E875 Hyperkalemia: Secondary | ICD-10-CM | POA: Diagnosis not present

## 2020-01-18 DIAGNOSIS — I129 Hypertensive chronic kidney disease with stage 1 through stage 4 chronic kidney disease, or unspecified chronic kidney disease: Secondary | ICD-10-CM | POA: Diagnosis not present

## 2020-01-18 DIAGNOSIS — R1030 Lower abdominal pain, unspecified: Secondary | ICD-10-CM | POA: Diagnosis not present

## 2020-01-18 DIAGNOSIS — Z86718 Personal history of other venous thrombosis and embolism: Secondary | ICD-10-CM | POA: Diagnosis not present

## 2020-01-18 DIAGNOSIS — E039 Hypothyroidism, unspecified: Secondary | ICD-10-CM | POA: Diagnosis not present

## 2020-01-18 DIAGNOSIS — R4182 Altered mental status, unspecified: Secondary | ICD-10-CM | POA: Diagnosis not present

## 2020-01-18 DIAGNOSIS — I440 Atrioventricular block, first degree: Secondary | ICD-10-CM | POA: Diagnosis not present

## 2020-01-18 DIAGNOSIS — N184 Chronic kidney disease, stage 4 (severe): Secondary | ICD-10-CM | POA: Diagnosis not present

## 2020-01-18 DIAGNOSIS — E875 Hyperkalemia: Secondary | ICD-10-CM | POA: Diagnosis not present

## 2020-01-18 DIAGNOSIS — I2699 Other pulmonary embolism without acute cor pulmonale: Secondary | ICD-10-CM | POA: Diagnosis not present

## 2020-02-06 NOTE — Progress Notes (Signed)
Patient ID: Lori Pacheco, female    DOB: Jul 28, 1921, 86 y.o.   MRN: [id number]  PCP: Steele Sizer, MD  Chief Complaint  Patient presents with  . Hospitalization Follow-up    f/u from blood clot in lung and leg    Subjective:   Lori Pacheco is a 86 y.o. female, presents to clinic with CC of the following:  Chief Complaint  Patient presents with  . Hospitalization Follow-up    f/u from blood clot in lung and leg    HPI: Patient's daughter with her today Patient is a 86 year old female patient of Dr. Ancil Boozer, with her last follow-up visit in January 2021 and that note reviewed, this is my first contact with the patient today. Follows up today for a posthospitalization visit. She presented to the [facility] emergency department with acute on chronic abdominal pain, was incidentally found to have a left lower extremity DVT and a right lower lobe PE and admitted 01/17/2020, and discharged the following day.  She was discharged on apixaban, with the risk/benefit ratio felt favoring this approach.  The risk/benefits of continued anticoagulation longer-term in addition to the further work-up for a potential cancer were deferred to follow-up as an outpatient with her PCP  She was also noted to have a UTI, with a 5-day course of an antibiotic prescribed.  His urine culture returned with mixed flora.  Her hospital course summary: Acute on chronic abdominal pain; chronic constipation: She has a longstanding history of chronic abdominal / flank pain for which she has been evaluated multiple times in the past by her PCP and outside hospital EDs. She has tried medications for this issue in the past (e.g. tramadol, lyrica) with no significant improvement and significant side effect burden (grogginess). She had a CT A/P in 05/2019 for abdominal / flank pain which showed no acute or inflammatory process, right colonic diverticulosis, and trace pelvic free fluid. Prior to that, she had a CT renal  protocol in 09/2018 which showed no acute pathology, a tiny non-obstructing left renal stone, and minimal (stable) pelvic free fluid. At this presentation, the description of her pain sounded similar to that which she has previously experienced in the past. She was afebrile, hypertensive, with otherwise normal VS. Her labs showed leukopenia (3.5), hyperkalemia (5.1), elevated creatinine (1.12), elevated lipase (288), elevated CK (173), elevated TSH (13.98), an UA with moderate LE / protein / moderate blood / 7 WBC / 6 RBC / 5 squam epi / rare bacteria / 1 hyaline cast, but otherwise unremarkable Cbc / CMP / troponin / BNP / FT4. Her EKG showed SB at 55 bpm, QTc 478 ms, first degree AVB, TWI in II/III/V5-V6 (old from 07/07/2020), poor R-wave progression in the precordial leads, and voltage criteria for LVH. 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 Dr. Ilsa Iha reviewed the CT A/P with radiology and noted no evidence of SMA occlusion or bowel ischemia. She was admitted to the hospital for monitoring and provided tylenol prn. On the morning of discharge, she felt markedly improved without specific interventions. She remained clinically stable from a vital and lab standpoint at well. The exact cause of her underlying discomfort remains unclear, but given the chronicity and desire of patient and her daughter to leave  soon, she will be discharged home with an aggressive bowel regimen, return precautions, and plans to follow-up with her PCP within 1 week (her daughter stated that she would call and make an appointment).   LLE DVT; RLL PE: On her CT A/P, she was incidentally noted to have a RLL pulmonary embolus. A follow-up bilateral LE venous US  showed evidence of acute obstruction in the left femoral vein at proximal thigh, femoral vein at mid thigh, femoral vein at distal thigh, gastrocnemius vein, peroneal vein, posterior tibial vein, and the left popliteal vein. As mentioned above, her vitals were normal, she had no specific symptoms related to these findings, and her troponin / BNP were normal. She was started on apixiban with plans to complete a load with 5 mg bid x 7 days, then 2.5 BID thereafter. On record review, it appears that she was diagnosed with a LLE DVT in 09/2018 for which she was started on eliquis, but discontinued this medication within the month because of "easy bruising." After a conversation with her PCP about the risks / benefits, she and her daughter decided not to restart this medication. A repeat bilateral LE venous US in 11/2018 showed no DVTs. After a long conversation with both Lori Pacheco and her daughter, Lori Pacheco, they decided that the benefits of treatment of her DVT and PE outweighed the risks of bleeding and falls (they expressed full understanding of the potential consequences). She will be discharged home to complete the apixiban load and maintenance therapy. She has been instructed to follow-up with her PCP within 1 week to continue discussions regarding risks versus benefits of therapy. Of note, the exact underlying cause of her DVT remains unclear; she is quite ambulatory and active outside of the hospital. 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 It is unclear to me when her last colonoscopy, mammography would have been, as she has aged out of typical screening at this point. In addition, she had a 3.3 x 1.1 cm hypoattenuating lesion within the pancreatic head incidentally noted on her CT A/P here which is not noted on her CT A/P from OSH  in 09/2018 or 05/2019. Although the appearance is typical for that of IPMN, certainly this could represent a cancer which would predispose her to clotting. I informed she and her daughter of all of this and recommended they discuss with her PCP regarding whether or not they would want to pursue any further work-up of this finding (with MRI/MRCP) or any other possible cancer screening measures; they expressed understanding.   Acute onset drowsiness: On the evening of 01/17/2020, a RR was called for acute onset drowsiness and decreased alertness. Her vitals were stable and she had no focal neurologic deficits or changes in baseline orientation, she just felt weak and tired. Her BG was normal. Her EKG at the time showed no dynamic changes and labs (including VBG) was unremarkable. She underwent head CT wo contrast showed showed severe chronic small vessel changes, diffuse parenchymal loss, but no acute pathology. It was noted that she had received requip within 2 hours of this episode; this is a medication that she does not normally take. By the morning of discharge, she was back to her mental status baseline, per her daughter at bedside.   Possible UTI: Although she did not report dysuria or changes in  urine appearance / smell, she did have the abdominal pain as per above, which could be a symptom of an UTI. Her UA showed findings consistent with an UTI, thus she was started on empiric ceftriaxone. A urine culture was sent and pending at time of discharge. She will be discharged home to complete a 5-day course of cefpodoxime.   HTN: Continued home lisinopril and norvasc  Hypothyroidism: Continued home synthroid. TSH mildly elevated at 13, but free T4 within normal range. Repeat TSH and free T4 in a few weeks.  CKD: Her baseline creatinine is around 1.2-1.4. Her creatinine on the day of discharge was 1.2.   Discharge Medications:  STOP taking these medications  ranitidine 150 MG tablet Commonly known  as: ZANTAC  rOPINIRole 0.5 MG tablet Commonly known as: REQUIP  TRULANCE 3 mg Tab Generic drug: plecanatide   START taking these medications  apixaban 2.5 mg Tab Commonly known as: ELIQUIS Take 2 tablets (5 mg total) by mouth Two (2) times a day for 6 days, THEN 1 tablet (2.5 mg total) Two (2) times a day. Start taking on: January 18, 2020  cefpodoxime 100 MG tablet Commonly known as: VANTIN Take 1 tablet (100 mg total) by mouth Two (2) times a day for 4 days.  polyethylene glycol 17 gram packet Commonly known as: MIRALAX Take 17 g by mouth Two (2) times a day.  senna 8.6 mg tablet Commonly known as: SENNA Take 1 tablet by mouth daily as needed for constipation.   CONTINUE taking these medications  amLODIPine 5 MG tablet Commonly known as: NORVASC Take 5 mg by mouth daily.  ergocalciferol-1,250 mcg (50,000 unit) 1,250 mcg (50,000 unit) capsule Commonly known as: DRISDOL Take 50,000 Units by mouth Every Sunday.  levothyroxine 100 MCG tablet Commonly known as: SYNTHROID Take 100 mcg by mouth daily.  lisinopriL 20 MG tablet Commonly known as: PRINIVIL,ZESTRIL Take 20 mg by mouth daily.  lubiprostone 24 MCG capsule Commonly known as: AMITIZA Take 24 mcg by mouth 2 (two) times a day with meals.  TYLENOL EXTRA STRENGTH 500 MG tablet Generic drug: acetaminophen Take 1,000 mg by mouth every six (6) hours as needed for pain  Lab Results  Component Value Date  WBC 4.8 01/18/2020  HGB 12.0 01/18/2020  HCT 37.6 01/18/2020  PLT 205 01/18/2020   Lab Results  Component Value Date  NA 140 01/18/2020  K 4.2 01/18/2020  CL 107 01/18/2020  CO2 27.0 01/18/2020  BUN 21 01/18/2020  CREATININE 1.21 (H) 01/18/2020  CALCIUM 9.4 01/18/2020  MG 2.0 01/18/2020   Lab Results  Component Value Date  ALKPHOS 69 01/18/2020  BILITOT 0.5 01/18/2020  PROT 7.0 01/18/2020  ALBUMIN 3.9 01/18/2020  ALT 9 01/18/2020  AST 24 01/18/2020   Lab Results  Component Value Date  PT 16.1  (H) 01/18/2020  INR 1.37 01/18/2020   Her PMH is  significant for acquired hypothyroidism, chronic abdominal pain, chronic constipation, and previous LLE DVT (diagnosed in 2019, resolved in 11/2018).  She notes that since discharge, which was about 3 weeks ago, she still has some pains in her upper abdomen, more at nighttime, and she has been taking Tylenol for this which helps some.  It is not as bad as when she went to the emergency room.  She has some intermittent constipation, and is taking milk of magnesia, MiraLAX, and a senna product.  She is not taking the Amitiza product she was on previously, which was noted that it was going to be refilled  back in January.  She felt it was more helpful and wanted to continue. She has had no dark black stools or any bleeding per rectum. She remains on the lower dose of Eliquis for the PE concern. She denies any easy bruising, although did note a small bruise recently on her left elbow region.  No bleeding concerns. She was on Eliquis in the past, and was stopped because of easy bruising, and are paying close attention to that possibility presently. She denies any problems with peeing, with no pain on urination.  She was called to stop the antibiotic after the culture returned as mixed flora, as she was not felt to have a UTI and not needing to continue the antibiotic. Did note the results of the CT scan from the hospitalization, with that lesion on the pancreas of unclear significance noted, and unclear if that is the source of some of her discomfort  Hypothyroidism: taking levothyroxine and and I noted the TSH was slightly elevated in the hospital, with the other thyroid test okay.  It was recommended following up with some labs in a few weeks time, and feel reasonable to check on her neck follow-up visit.  Depression hx: used to take SSRI but stopped on her own, phq 9 is better today upon review.  Patient is very appropriate with conversation today.  Does  take hydroxyzine as needed, more for anxiety symptoms.   Patient Active Problem List   Diagnosis Date Noted  . Acute pulmonary embolism without acute cor pulmonale ([place]) 02/07/2020  . Chronic abdominal pain 02/07/2020  . Depression with anxiety 02/07/2020  . Cognitive dysfunction 12/25/2018  . Varicose veins of leg with pain, bilateral 12/04/2018  . Deep vein thrombosis (DVT) of femoral vein of left lower extremity ([place]) 10/17/2018  . Other constipation 04/23/2018  . Leg cramps 04/23/2018  . Secondary renal hyperparathyroidism ([place]) 05/15/2017  . Atherosclerosis of abdominal aorta ([place]) 11/10/2016  . Perennial allergic rhinitis with seasonal variation 11/10/2016  . Vitamin D deficiency 03/29/2016  . Hypothyroidism 09/18/2015  . Essential hypertension 05/19/2015  . Hyperlipemia 05/19/2015  . Chronic kidney disease, stage IV (severe) ([place]) 05/19/2015      Current Outpatient Medications:  .  acetaminophen (TYLENOL) 500 MG tablet, Take 1 tablet (500 mg total) by mouth every 6 (six) hours as needed., Disp: 100 tablet, Rfl: 0 .  amLODipine (NORVASC) 10 MG tablet, Take 1 tablet (10 mg total) by mouth daily., Disp: 90 tablet, Rfl: 1 .  apixaban (ELIQUIS) 2.5 MG TABS tablet, Take by mouth., Disp: , Rfl:  .  Blood Glucose-BP Monitor (BLOOD GLUCOSE-WRIST BP MONITOR) DEVI, 1 each by Does not apply route daily., Disp: 1 each, Rfl: 0 .  hydrALAZINE (APRESOLINE) 10 MG tablet, Take 1 tablet (10 mg total) by mouth every 6 (six) hours as needed. IF BP IS ABOVE 160/90, Disp: 30 tablet, Rfl: 1 .  hydrOXYzine (ATARAX/VISTARIL) 10 MG tablet, Take 1 tablet (10 mg total) by mouth 3 (three) times daily as needed for itching or anxiety., Disp: 90 tablet, Rfl: 0 .  levothyroxine (SYNTHROID) 112 MCG tablet, Skip Sundays, Disp: 90 tablet, Rfl: 0 .  lidocaine (LIDODERM) 5 %, Place 2 patches onto the skin every morning. And remove it at night, Disp: 180 patch, Rfl: 0 .  linaclotide (LINZESS) 72 MCG capsule, Take  1 capsule (72 mcg total) by mouth daily before breakfast. In place of Amitiza, monitor for diarrhea, Disp: 90 capsule, Rfl: 1 .  lisinopril (ZESTRIL) 20 MG tablet, Take  1 tablet (20 mg total) by mouth daily., Disp: 90 tablet, Rfl: 1 .  Vitamin D, Ergocalciferol, (DRISDOL) 1.25 MG (50000 UNIT) CAPS capsule, TAKE 1 CAPSULE BY MOUTH  ONCE WEEKLY, Disp: 12 capsule, Rfl: 1   Allergies  Allergen Reactions  . Lyrica [Pregabalin]     " I felt groggy"     Past Surgical History:  Procedure Laterality Date  . NO PAST SURGERIES       Family History  Problem Relation Age of Onset  . Healthy Mother   . Healthy Father   . Heart disease Brother   . Cancer Brother   . Cancer Daughter   . Alcohol abuse Son   . Diabetes Son   . Heart disease Son      Social History   Tobacco Use  . Smoking status: Never Smoker  . Smokeless tobacco: Former Systems developer    Types: Snuff  . Tobacco comment: smoking cessation materials not required  Substance Use Topics  . Alcohol use: No    Alcohol/week: 0.0 standard drinks    With staff assistance, above reviewed with the patient/caregiver today.  ROS: As per HPI, otherwise no specific complaints on a limited and focused system review   No results found for this or any previous visit (from the past 72 hour(s)).   PHQ2/9: Depression screen [facility] 2/9 02/07/2020 11/01/2019 07/01/2019 05/28/2019 04/15/2019  Decreased Interest 0 0 2 0 1  Down, Depressed, Hopeless 1 3 2  - 1  PHQ - 2 Score 1 3 4  0 2  Altered sleeping 2 2 2 3 1   Tired, decreased energy 2 3 2  0 1  Change in appetite 2 3 3 3 1   Feeling bad or failure about yourself  0 2 3 1  0  Trouble concentrating 0 1 0 0 0  Moving slowly or fidgety/restless 1 1 0 0 0  Suicidal thoughts 0 0 0 0 1  PHQ-9 Score 8 15 14 7 6   Difficult doing work/chores Somewhat difficult Somewhat difficult Somewhat difficult Not difficult at all Somewhat difficult  Some recent data might be hidden   PHQ-2/9 Result reviewed, improved  from January  Fall Risk: Fall Risk  02/07/2020 11/01/2019 07/01/2019 05/28/2019 04/15/2019  Falls in the past year? 1 1 0 0 0  Comment - - - - -  Number falls in past yr: 0 0 0 0 0  Injury with Fall? 0 0 0 0 0  Risk Factor Category  - - - - -  Comment - - - - -  Risk for fall due to : - History of fall(s);Impaired balance/gait - - -  Risk for fall due to: Comment - - - - -  Follow up - - - - -      Objective:   Vitals:   02/07/20 1346  BP: 120/80  Pulse: 96  Resp: 16  Temp: (!) 97.3 F (36.3 C)  TempSrc: Temporal  SpO2: 98%  Weight: 123 lb 14.4 oz (56.2 kg)  Height: 5\' 2"  (1.575 m)    Body mass index is 22.66 kg/m.  Physical Exam   NAD, masked, pleasant HEENT - [place]/AT, sclera anicteric, PERRL, EOMI, conj - non-inj'ed,  Neck - supple,  no JVD, carotids 2+ and = without bruits bilat Car - RRR without m/g/r, not tachy on exam. Pulm- RR and effort normal at rest, CTA without wheeze or rales, not painful with inspirations. Abd - soft, mildly tender in the epigastric region and above the umbilicus, not more  to the right or left side, no rebound or guarding noted, ND, BS+,  no masses Back - no focal CVA tenderness Ext - no LE edema, a small ecchymotic area was present in the left elbow region, not marked and not tender, no other bruising is noted on extremities, face, abdomen, and denied otherwise Neuro/psychiatric - affect was not flat, appropriate with conversation  Alert   Speech normal       Assessment & Plan:    1. Acute pulmonary embolism without acute cor pulmonale, unspecified pulmonary embolism type ([place]) Found more incidentally on recent hospitalization.  Now back on Eliquis, and seems to be tolerating well today.  Denies any bleeding concerns or easy bruising. Discussed the risk benefit with the Eliquis, which was felt to favor starting it again, although if there are some concerns clinically with easy bruising, any bleeding issues, do need to revisit. Continue to  closely monitor. We will continue Eliquis presently  2. Deep vein thrombosis (DVT) of femoral vein of left lower extremity, unspecified chronicity ([facility]) As above, on Eliquis.  This was diagnosed previously to her hospitalization, and had been on Eliquis in the past before stopping as noted above.  3. Chronic abdominal pain Exact cause unclear, and unclear if any of the CT findings from the recent hospitalization are a source of pain.  She does have chronic constipation which may be contributing.  She has not been taking the Amitiza product, and after our discussions, I did want to return to that.  That was refilled for her today.  Also will continue the MiraLAX products, the milk of mag product and senna products as needed to help. Can continue Tylenol products as they are helpful, although emphasized not taking other NSAIDs like Advil or Aleve products presently.  4. Other constipation As above noted  5. Acquired hypothyroidism We will recheck her thyroid status again with lab on a follow-up visit.  The TSH was noted to be slightly high in the hospitalization.  6. Essential hypertension Blood pressure remains well controlled presently.  7. Depression with anxiety Her PHQ-9 today was improved from January, and she seems to be doing pretty well in this regard.  We will continue to monitor presently.  8. Encounter for examination following treatment at hospital   Discussed potential next steps regarding her chronic abdominal pains, and potential further work-up, especially noting some of the CT scan results from the hospitalization.  It was felt best to have Dr. Ancil Boozer who has been her PCP for many years involved, and also involved with the potential continuation of Eliquis longer-term noting that risk-benefit. We will schedule follow-up with Dr. Finis Bud in approximately 6 weeks time, and will get some labs at that follow-up visit as well. Can follow-up sooner as needed.      Towanda Malkin, MD 02/07/20 1:55 PM

## 2020-02-07 ENCOUNTER — Ambulatory Visit: Payer: Medicare PPO | Admitting: Internal Medicine

## 2020-02-07 ENCOUNTER — Encounter: Payer: Self-pay | Admitting: Internal Medicine

## 2020-02-07 ENCOUNTER — Other Ambulatory Visit: Payer: Self-pay

## 2020-02-07 VITALS — BP 120/80 | HR 96 | Temp 97.3°F | Resp 16 | Ht 62.0 in | Wt 123.9 lb

## 2020-02-07 DIAGNOSIS — I82412 Acute embolism and thrombosis of left femoral vein: Secondary | ICD-10-CM

## 2020-02-07 DIAGNOSIS — R109 Unspecified abdominal pain: Secondary | ICD-10-CM | POA: Diagnosis not present

## 2020-02-07 DIAGNOSIS — I10 Essential (primary) hypertension: Secondary | ICD-10-CM

## 2020-02-07 DIAGNOSIS — F418 Other specified anxiety disorders: Secondary | ICD-10-CM

## 2020-02-07 DIAGNOSIS — Z09 Encounter for follow-up examination after completed treatment for conditions other than malignant neoplasm: Secondary | ICD-10-CM | POA: Diagnosis not present

## 2020-02-07 DIAGNOSIS — E039 Hypothyroidism, unspecified: Secondary | ICD-10-CM | POA: Diagnosis not present

## 2020-02-07 DIAGNOSIS — K5909 Other constipation: Secondary | ICD-10-CM

## 2020-02-07 DIAGNOSIS — G8929 Other chronic pain: Secondary | ICD-10-CM

## 2020-02-07 DIAGNOSIS — I2699 Other pulmonary embolism without acute cor pulmonale: Secondary | ICD-10-CM | POA: Diagnosis not present

## 2020-02-07 DIAGNOSIS — Z86711 Personal history of pulmonary embolism: Secondary | ICD-10-CM | POA: Insufficient documentation

## 2020-02-07 MED ORDER — LUBIPROSTONE 24 MCG PO CAPS: 24.0000 ug | ORAL_CAPSULE | Freq: Two times a day (BID) | ORAL | 1 refills | Status: DC

## 2020-02-13 ENCOUNTER — Other Ambulatory Visit: Payer: Self-pay

## 2020-03-02 ENCOUNTER — Ambulatory Visit: Payer: Medicare PPO | Admitting: Family Medicine

## 2020-03-02 ENCOUNTER — Encounter: Payer: Self-pay | Admitting: Family Medicine

## 2020-03-02 ENCOUNTER — Other Ambulatory Visit: Payer: Self-pay

## 2020-03-02 VITALS — BP 160/82 | HR 83 | Temp 97.5°F | Resp 16 | Ht 62.0 in | Wt 124.0 lb

## 2020-03-02 DIAGNOSIS — E039 Hypothyroidism, unspecified: Secondary | ICD-10-CM

## 2020-03-02 DIAGNOSIS — I10 Essential (primary) hypertension: Secondary | ICD-10-CM

## 2020-03-02 DIAGNOSIS — N183 Chronic kidney disease, stage 3 unspecified: Secondary | ICD-10-CM

## 2020-03-02 DIAGNOSIS — N1831 Chronic kidney disease, stage 3a: Secondary | ICD-10-CM | POA: Diagnosis not present

## 2020-03-02 DIAGNOSIS — I82409 Acute embolism and thrombosis of unspecified deep veins of unspecified lower extremity: Secondary | ICD-10-CM | POA: Diagnosis not present

## 2020-03-02 DIAGNOSIS — Z86711 Personal history of pulmonary embolism: Secondary | ICD-10-CM

## 2020-03-02 DIAGNOSIS — I824Z2 Acute embolism and thrombosis of unspecified deep veins of left distal lower extremity: Secondary | ICD-10-CM | POA: Diagnosis not present

## 2020-03-02 DIAGNOSIS — N1832 Chronic kidney disease, stage 3b: Secondary | ICD-10-CM | POA: Diagnosis not present

## 2020-03-02 DIAGNOSIS — K551 Chronic vascular disorders of intestine: Secondary | ICD-10-CM | POA: Diagnosis not present

## 2020-03-02 DIAGNOSIS — I129 Hypertensive chronic kidney disease with stage 1 through stage 4 chronic kidney disease, or unspecified chronic kidney disease: Secondary | ICD-10-CM

## 2020-03-02 MED ORDER — LISINOPRIL 20 MG PO TABS: 20.0000 mg | ORAL_TABLET | Freq: Every day | ORAL | 1 refills | Status: DC

## 2020-03-02 MED ORDER — AMLODIPINE BESYLATE 10 MG PO TABS: 10.0000 mg | ORAL_TABLET | Freq: Every day | ORAL | 1 refills | Status: DC

## 2020-03-02 MED ORDER — LEVOTHYROXINE SODIUM 112 MCG PO TABS: ORAL_TABLET | ORAL | 0 refills | Status: DC

## 2020-03-02 MED ORDER — APIXABAN 2.5 MG PO TABS: 2.5000 mg | ORAL_TABLET | Freq: Two times a day (BID) | ORAL | 1 refills | Status: DC

## 2020-03-02 NOTE — Progress Notes (Signed)
Name: Lori Pacheco   MRN: [id number]    DOB: 1920/11/07   Date:03/02/2020       Progress Note  Subjective  Chief Complaint  Chief Complaint  Patient presents with  . Hypertension  . Hypothyroidism  . Depression    HPI   CT abdomen done [facility] in 2015   1. No evidence of bowel ischemia. 2. Prominent calcified atheromatous plaque at the origin of the celiac artery and SMA resulting in mild-moderate stenosis, as above, but the distal branches remain patent. 3. Small amount of free fluid in the pelvis of unknown etiology.   Repeat CT abdomen at [facility] 01/2020   1. Right lower lobe pulmonary embolism. This was discussed with the ordering ER physician. 2. 3.3 x 1.1 cm hypoattenuating lesion within the pancreatic head, favored to represent a sidebranch IPMN. In the setting of advanced age, no definite follow-up is necessary, however, this may be further evaluated with MRI/MRCP as clinically desired. 3. Small-volume abdominal pelvic ascites with diffuse mesenteric edema. Findings are nonspecific without identifiable cause by CT. 4. No evidence of bowel obstruction as clinically questioned.  DVT left leg: diagnosed during hospital stay, for some reason she is down to 2.5 mg once a day, explained treatment needs to be for at least 6 months at 2.5 mg BID.   Patient Active Problem List   Diagnosis Date Noted  . Superior mesenteric artery stenosis ([place]) 03/02/2020  . Stage 3b chronic kidney disease 03/02/2020  . History of pulmonary embolism 02/07/2020  . Chronic abdominal pain 02/07/2020  . Depression with anxiety 02/07/2020  . Cognitive dysfunction 12/25/2018  . Varicose veins of leg with pain, bilateral 12/04/2018  . Deep vein thrombosis (DVT) of femoral vein of left lower extremity ([place]) 10/17/2018  . Other constipation 04/23/2018  . Leg cramps 04/23/2018  . Secondary renal hyperparathyroidism ([place]) 05/15/2017  . Atherosclerosis of abdominal aorta ([place]) 11/10/2016  . Perennial  allergic rhinitis with seasonal variation 11/10/2016  . Vitamin D deficiency 03/29/2016  . Hypothyroidism 09/18/2015  . Essential hypertension 05/19/2015  . Hyperlipemia 05/19/2015    Past Surgical History:  Procedure Laterality Date  . NO PAST SURGERIES      Family History  Problem Relation Age of Onset  . Healthy Mother   . Healthy Father   . Heart disease Brother   . Cancer Brother   . Cancer Daughter   . Alcohol abuse Son   . Diabetes Son   . Heart disease Son     Social History   Tobacco Use  . Smoking status: Never Smoker  . Smokeless tobacco: Former Systems developer    Types: Snuff  . Tobacco comment: smoking cessation materials not required  Substance Use Topics  . Alcohol use: No    Alcohol/week: 0.0 standard drinks     Current Outpatient Medications:  .  acetaminophen (TYLENOL) 500 MG tablet, Take 1 tablet (500 mg total) by mouth every 6 (six) hours as needed., Disp: 100 tablet, Rfl: 0 .  amLODipine (NORVASC) 10 MG tablet, Take 1 tablet (10 mg total) by mouth daily., Disp: 90 tablet, Rfl: 1 .  apixaban (ELIQUIS) 2.5 MG TABS tablet, Take by mouth., Disp: , Rfl:  .  Blood Glucose-BP Monitor (BLOOD GLUCOSE-WRIST BP MONITOR) DEVI, 1 each by Does not apply route daily., Disp: 1 each, Rfl: 0 .  hydrALAZINE (APRESOLINE) 10 MG tablet, Take 1 tablet (10 mg total) by mouth every 6 (six) hours as needed. IF BP IS ABOVE 160/90, Disp: 30 tablet,  Rfl: 1 .  hydrOXYzine (ATARAX/VISTARIL) 10 MG tablet, Take 1 tablet (10 mg total) by mouth 3 (three) times daily as needed for itching or anxiety., Disp: 90 tablet, Rfl: 0 .  levothyroxine (SYNTHROID) 112 MCG tablet, Skip Sundays, Disp: 90 tablet, Rfl: 0 .  lidocaine (LIDODERM) 5 %, Place 2 patches onto the skin every morning. And remove it at night, Disp: 180 patch, Rfl: 0 .  linaclotide (LINZESS) 72 MCG capsule, Take 1 capsule (72 mcg total) by mouth daily before breakfast. In place of Amitiza, monitor for diarrhea, Disp: 90 capsule, Rfl:  1 .  lisinopril (ZESTRIL) 20 MG tablet, Take 1 tablet (20 mg total) by mouth daily., Disp: 90 tablet, Rfl: 1 .  lubiprostone (AMITIZA) 24 MCG capsule, Take 1 capsule (24 mcg total) by mouth 2 (two) times daily with a meal., Disp: 60 capsule, Rfl: 1 .  Vitamin D, Ergocalciferol, (DRISDOL) 1.25 MG (50000 UNIT) CAPS capsule, TAKE 1 CAPSULE BY MOUTH  ONCE WEEKLY, Disp: 12 capsule, Rfl: 1  Allergies  Allergen Reactions  . Lyrica [Pregabalin]     " I felt groggy"    I personally reviewed active problem list, medication list, allergies, family history, social history, health maintenance with the patient/caregiver today.   ROS  Constitutional: Negative for fever or weight change.  Respiratory: Negative for cough and shortness of breath.   Cardiovascular: Negative for chest pain or palpitations.  Gastrointestinal: Positive  for abdominal pain, but no  bowel changes.  Musculoskeletal: Negative for gait problem or joint swelling.  Skin: Negative for rash.  Neurological: Negative for dizziness or headache.  No other specific complaints in a complete review of systems (except as listed in HPI above).  Objective  Vitals:   03/02/20 1501  BP: (!) 160/82  Pulse: 83  Resp: 16  Temp: (!) 97.5 F (36.4 C)  TempSrc: Temporal  SpO2: 98%  Weight: 124 lb (56.2 kg)  Height: 5\' 2"  (1.575 m)    Body mass index is 22.68 kg/m.  Physical Exam  Constitutional: Patient appears well-developed and well-nourished. No distress.  HEENT: head atraumatic, normocephalic, pupils equal and reactive to light Cardiovascular: Normal rate, regular rhythm and normal heart sounds.  No murmur heard. No BLE edema. Pulmonary/Chest: Effort normal and breath sounds normal. No respiratory distress. Abdominal: Soft.  There is no tenderness. Psychiatric: Patient has a normal mood and affect. behavior is normal. Judgment and thought content normal.  Recent Results (from the past 2160 hour(s))  TSH     Status: None    Collection Time: 12/10/19  2:05 PM  Result Value Ref Range   TSH 2.80 0.40 - 4.50 mIU/L      PHQ2/9: Depression screen [facility] 2/9 03/02/2020 02/07/2020 11/01/2019 07/01/2019 05/28/2019  Decreased Interest 1 0 0 2 0  Down, Depressed, Hopeless 0 1 3 2  -  PHQ - 2 Score 1 1 3 4  0  Altered sleeping 1 2 2 2 3   Tired, decreased energy 1 2 3 2  0  Change in appetite 2 2 3 3 3   Feeling bad or failure about yourself  0 0 2 3 1   Trouble concentrating 1 0 1 0 0  Moving slowly or fidgety/restless 1 1 1  0 0  Suicidal thoughts 0 0 0 0 0  PHQ-9 Score 7 8 15 14 7   Difficult doing work/chores Not difficult at all Somewhat difficult Somewhat difficult Somewhat difficult Not difficult at all  Some recent data might be hidden    phq 9 is  positive   Fall Risk: Fall Risk  02/07/2020 11/01/2019 07/01/2019 05/28/2019 04/15/2019  Falls in the past year? 1 1 0 0 0  Comment - - - - -  Number falls in past yr: 0 0 0 0 0  Injury with Fall? 0 0 0 0 0  Risk Factor Category  - - - - -  Comment - - - - -  Risk for fall due to : - History of fall(s);Impaired balance/gait - - -  Risk for fall due to: Comment - - - - -  Follow up - - - - -    Functional Status Survey: Is the patient deaf or have difficulty hearing?: No Does the patient have difficulty seeing, even when wearing glasses/contacts?: No Does the patient have difficulty concentrating, remembering, or making decisions?: No Does the patient have difficulty walking or climbing stairs?: No Does the patient have difficulty dressing or bathing?: No Does the patient have difficulty doing errands alone such as visiting a doctor's office or shopping?: No    Assessment & Plan  1. History of pulmonary embolism  Taking Eliquis but only once a day, spent time discussing regiment with her daughter, because of history of DVT in the past and now with recurrence plus PE we will keep her on Eliquis.   2. Stage 3b chronic kidney disease  stable  3. Lower leg DVT  (deep venous thromboembolism), acute, left (HCC)  No pain during exam   4. Recurrent deep vein thrombosis (DVT) of lower extremity, unspecified laterality ([place])  Stay on Eliquis   5. Superior mesenteric artery stenosis (HCC)  Likely the cause of recurrent abdominal pain   6. Essential hypertension  - amLODipine (NORVASC) 10 MG tablet; Take 1 tablet (10 mg total) by mouth daily.  Dispense: 90 tablet; Refill: 1 - lisinopril (ZESTRIL) 20 MG tablet; Take 1 tablet (20 mg total) by mouth daily.  Dispense: 90 tablet; Refill: 1  7. Benign hypertension with chronic kidney disease, stage III  - amLODipine (NORVASC) 10 MG tablet; Take 1 tablet (10 mg total) by mouth daily.  Dispense: 90 tablet; Refill: 1 - lisinopril (ZESTRIL) 20 MG tablet; Take 1 tablet (20 mg total) by mouth daily.  Dispense: 90 tablet; Refill: 1  8. Chronic kidney disease, stage III (moderate)  - lisinopril (ZESTRIL) 20 MG tablet; Take 1 tablet (20 mg total) by mouth daily.  Dispense: 90 tablet; Refill: 1  9. Acquired hypothyroidism  - levothyroxine (SYNTHROID) 112 MCG tablet; Skip Sundays  Dispense: 90 tablet; Refill: 0

## 2020-03-06 ENCOUNTER — Other Ambulatory Visit: Payer: Self-pay | Admitting: Family Medicine

## 2020-03-06 DIAGNOSIS — E039 Hypothyroidism, unspecified: Secondary | ICD-10-CM

## 2020-03-06 MED ORDER — LEVOTHYROXINE SODIUM 112 MCG PO TABS: ORAL_TABLET | ORAL | 1 refills | Status: DC

## 2020-04-10 ENCOUNTER — Telehealth: Payer: Self-pay

## 2020-04-10 ENCOUNTER — Other Ambulatory Visit: Payer: Self-pay

## 2020-04-10 MED ORDER — LUBIPROSTONE 24 MCG PO CAPS: 24.0000 ug | ORAL_CAPSULE | Freq: Two times a day (BID) | ORAL | 1 refills | Status: DC

## 2020-04-10 NOTE — Telephone Encounter (Signed)
Error

## 2020-04-17 ENCOUNTER — Encounter: Payer: Self-pay | Admitting: Emergency Medicine

## 2020-04-17 ENCOUNTER — Emergency Department
Admission: EM | Admit: 2020-04-17 | Discharge: 2020-04-17 | Disposition: A | Discharge status: Home / Self Care | Payer: Medicare PPO | Attending: Emergency Medicine | Admitting: Emergency Medicine

## 2020-04-17 ENCOUNTER — Other Ambulatory Visit: Payer: Self-pay

## 2020-04-17 DIAGNOSIS — Z79890 Hormone replacement therapy (postmenopausal): Secondary | ICD-10-CM | POA: Insufficient documentation

## 2020-04-17 DIAGNOSIS — E039 Hypothyroidism, unspecified: Secondary | ICD-10-CM | POA: Insufficient documentation

## 2020-04-17 DIAGNOSIS — Z79899 Other long term (current) drug therapy: Secondary | ICD-10-CM | POA: Insufficient documentation

## 2020-04-17 DIAGNOSIS — I129 Hypertensive chronic kidney disease with stage 1 through stage 4 chronic kidney disease, or unspecified chronic kidney disease: Secondary | ICD-10-CM | POA: Insufficient documentation

## 2020-04-17 DIAGNOSIS — R1030 Lower abdominal pain, unspecified: Secondary | ICD-10-CM | POA: Insufficient documentation

## 2020-04-17 DIAGNOSIS — R9431 Abnormal electrocardiogram [ECG] [EKG]: Secondary | ICD-10-CM | POA: Diagnosis not present

## 2020-04-17 DIAGNOSIS — N1832 Chronic kidney disease, stage 3b: Secondary | ICD-10-CM | POA: Diagnosis not present

## 2020-04-17 DIAGNOSIS — R1031 Right lower quadrant pain: Secondary | ICD-10-CM | POA: Diagnosis not present

## 2020-04-17 DIAGNOSIS — R1032 Left lower quadrant pain: Secondary | ICD-10-CM | POA: Diagnosis not present

## 2020-04-17 LAB — COMPREHENSIVE METABOLIC PANEL
ALT: 11 U/L (ref 0–44)
AST: 18 U/L (ref 15–41)
Albumin: 4.1 g/dL (ref 3.5–5.0)
Alkaline Phosphatase: 46 U/L (ref 38–126)
Anion gap: 13 (ref 5–15)
BUN: 34 mg/dL — ABNORMAL HIGH (ref 8–23)
CO2: 22 mmol/L (ref 22–32)
Calcium: 9.2 mg/dL (ref 8.9–10.3)
Chloride: 104 mmol/L (ref 98–111)
Creatinine, Ser: 1.51 mg/dL — ABNORMAL HIGH (ref 0.44–1.00)
GFR calc Af Amer: 33 mL/min — ABNORMAL LOW (ref >60)
GFR calc non Af Amer: 29 mL/min — ABNORMAL LOW (ref >60)
Glucose, Bld: 89 mg/dL (ref 70–99)
Potassium: 4.3 mmol/L (ref 3.5–5.1)
Sodium: 139 mmol/L (ref 135–145)
Total Bilirubin: 1.1 mg/dL (ref 0.3–1.2)
Total Protein: 7.4 g/dL (ref 6.5–8.1)

## 2020-04-17 LAB — URINALYSIS, COMPLETE (UACMP) WITH MICROSCOPIC
Bilirubin Urine: NEGATIVE
Glucose, UA: NEGATIVE mg/dL
Ketones, ur: 5 mg/dL — AB
Nitrite: NEGATIVE
Protein, ur: NEGATIVE mg/dL
Specific Gravity, Urine: 1.021 (ref 1.005–1.030)
pH: 5 (ref 5.0–8.0)

## 2020-04-17 LAB — CBC
HCT: 33.5 % — ABNORMAL LOW (ref 36.0–46.0)
Hemoglobin: 10.7 g/dL — ABNORMAL LOW (ref 12.0–15.0)
MCH: 28.8 pg (ref 26.0–34.0)
MCHC: 31.9 g/dL (ref 30.0–36.0)
MCV: 90.3 fL (ref 80.0–100.0)
Platelets: 142 10*3/uL — ABNORMAL LOW (ref 150–400)
RBC: 3.71 MIL/uL — ABNORMAL LOW (ref 3.87–5.11)
RDW: 13.2 % (ref 11.5–15.5)
WBC: 2.6 10*3/uL — ABNORMAL LOW (ref 4.0–10.5)
nRBC: 0 % (ref 0.0–0.2)

## 2020-04-17 LAB — LIPASE, BLOOD: Lipase: 49 U/L (ref 11–51)

## 2020-04-17 LAB — LACTIC ACID, PLASMA: Lactic Acid, Venous: 1 mmol/L (ref 0.5–1.9)

## 2020-04-17 MED ORDER — SODIUM CHLORIDE 0.9% FLUSH: 3.0000 mL | Freq: Once | INTRAVENOUS | Status: DC

## 2020-04-17 NOTE — Discharge Instructions (Addendum)
Return to the ER for new, worsening, or persistent severe abdominal pain, vomiting, diarrhea, blood in the stool, weakness, or any other new or worsening symptoms that concern you.

## 2020-04-17 NOTE — ED Triage Notes (Signed)
Patient says abdominal pain.  fam member says pt has been complaining on and off for aobut a week, but today she said how bad it was last night.

## 2020-04-17 NOTE — ED Notes (Signed)
Pt ambulatory to bathroom with standby assist.

## 2020-04-17 NOTE — ED Notes (Signed)
ED Provider at bedside. 

## 2020-04-17 NOTE — ED Provider Notes (Signed)
[facility] Emergency Department Provider Note ____________________________________________   First MD Initiated Contact with Patient 04/17/20 1939     (approximate)  I have reviewed the triage vital signs and the nursing notes.   HISTORY  Chief Complaint Abdominal Pain  Level 5 caveat: History of present illness limited due to cognitive impairment  HPI Lori Pacheco is a 86 y.o. female with PMH as noted below who presents with abdominal pain which has been present for about the last year and occurring intermittently.  She states that she feels it somewhat almost every day, but last night and today it was worse.  The patient states it is now resolved.  She describes bilateral pain in the lower abdomen.  She has no vomiting or diarrhea.  Her daughter states that she is somewhat constipated but responds well to milk of magnesia.  She has no fever and no urinary symptoms.  Past Medical History:  Diagnosis Date  . Hyperlipidemia   . Hypertension   . Thyroid disease     Patient Active Problem List   Diagnosis Date Noted  . Superior mesenteric artery stenosis ([place]) 03/02/2020  . Stage 3b chronic kidney disease 03/02/2020  . History of pulmonary embolism 02/07/2020  . Chronic abdominal pain 02/07/2020  . Depression with anxiety 02/07/2020  . Cognitive dysfunction 12/25/2018  . Varicose veins of leg with pain, bilateral 12/04/2018  . Deep vein thrombosis (DVT) of femoral vein of left lower extremity ([place]) 10/17/2018  . Other constipation 04/23/2018  . Leg cramps 04/23/2018  . Secondary renal hyperparathyroidism ([place]) 05/15/2017  . Atherosclerosis of abdominal aorta ([place]) 11/10/2016  . Perennial allergic rhinitis with seasonal variation 11/10/2016  . Vitamin D deficiency 03/29/2016  . Hypothyroidism 09/18/2015  . Essential hypertension 05/19/2015  . Hyperlipemia 05/19/2015    Past Surgical History:  Procedure Laterality Date  . NO PAST SURGERIES        Prior to Admission medications   Medication Sig Start Date End Date Taking? Authorizing Provider  acetaminophen (TYLENOL) 500 MG tablet Take 1 tablet (500 mg total) by mouth every 6 (six) hours as needed. 07/24/17  Yes Sowles, Drue Stager, MD  amLODipine (NORVASC) 10 MG tablet Take 1 tablet (10 mg total) by mouth daily. 03/02/20  Yes Sowles, Drue Stager, MD  apixaban (ELIQUIS) 2.5 MG TABS tablet Take 1 tablet (2.5 mg total) by mouth 2 (two) times daily. Patient taking differently: Take 2.5 mg by mouth daily.  03/02/20 06/06/20 Yes Sowles, Drue Stager, MD  Blood Glucose-BP Monitor (BLOOD GLUCOSE-WRIST BP MONITOR) DEVI 1 each by Does not apply route daily. 04/15/19  Yes Sowles, Drue Stager, MD  hydrOXYzine (ATARAX/VISTARIL) 10 MG tablet Take 1 tablet (10 mg total) by mouth 3 (three) times daily as needed for itching or anxiety. 11/22/18  Yes Sowles, Drue Stager, MD  levothyroxine (SYNTHROID) 112 MCG tablet One daily and skip sundays Patient taking differently: Take 112 mcg by mouth See admin instructions. Take 1 tablet (144mcg) by mouth every morning except Sunday 03/06/20  Yes Sowles, Drue Stager, MD  lidocaine (LIDODERM) 5 % Place 2 patches onto the skin every morning. And remove it at night Patient taking differently: Place 1-2 patches onto the skin daily as needed (pain). (remove after 12 hours) 11/16/19  Yes Sowles, Drue Stager, MD  lisinopril (ZESTRIL) 20 MG tablet Take 1 tablet (20 mg total) by mouth daily. 03/02/20  Yes Sowles, Drue Stager, MD  Vitamin D, Ergocalciferol, (DRISDOL) 1.25 MG (50000 UNIT) CAPS capsule TAKE 1 CAPSULE BY MOUTH  ONCE WEEKLY Patient  taking differently: Take 50,000 Units by mouth every Sunday.  01/13/20  Yes Steele Sizer, MD    Allergies Lyrica [pregabalin]  Family History  Problem Relation Age of Onset  . Healthy Mother   . Healthy Father   . Heart disease Brother   . Cancer Brother   . Cancer Daughter   . Alcohol abuse Son   . Diabetes Son   . Heart disease Son     Social  History Social History   Tobacco Use  . Smoking status: Never Smoker  . Smokeless tobacco: Former Systems developer    Types: Snuff  . Tobacco comment: smoking cessation materials not required  Vaping Use  . Vaping Use: Never used  Substance Use Topics  . Alcohol use: No    Alcohol/week: 0.0 standard drinks  . Drug use: No    Review of Systems Level 5 caveat: Review of systems limited due to cognitive impairment Constitutional: No fever. Cardiovascular: Denies chest pain. Respiratory: Denies shortness of breath. Gastrointestinal: No vomiting or diarrhea. Genitourinary: Negative for dysuria.  Musculoskeletal: Positive for back pain. Neurological: Negative for headache.   ____________________________________________   PHYSICAL EXAM:  VITAL SIGNS: ED Triage Vitals  Enc Vitals Group     BP 04/17/20 1449 (!) 154/62     Pulse Rate 04/17/20 1449 63     Resp 04/17/20 1449 16     Temp 04/17/20 1449 98.1 F (36.7 C)     Temp Source 04/17/20 1449 Oral     SpO2 04/17/20 1449 100 %     Weight 04/17/20 1450 124 lb (56.2 kg)     Height --      Head Circumference --      Peak Flow --      Pain Score 04/17/20 1450 0     Pain Loc --      Pain Edu? --      Excl. in [place]? --     Constitutional: Alert, comfortable appearing, no acute distress. Eyes: Conjunctivae are normal.  No scleral icterus. Head: Atraumatic. Nose: No congestion/rhinnorhea. Mouth/Throat: Mucous membranes are moist.   Neck: Normal range of motion.  Cardiovascular: Normal rate, regular rhythm.  Good peripheral circulation. Respiratory: Normal respiratory effort.  No retractions.  Gastrointestinal: Soft and nontender. No distention.  Genitourinary: No flank tenderness. Musculoskeletal: No lower extremity edema.  Extremities warm and well perfused.  No midline spinal tenderness.   Neurologic:  Normal speech and language. No gross focal neurologic deficits are appreciated.  Skin:  Skin is warm and dry. No rash  noted. Psychiatric: Mood and affect are normal. Speech and behavior are normal.  ____________________________________________   LABS (all labs ordered are listed, but only abnormal results are displayed)  Labs Reviewed  COMPREHENSIVE METABOLIC PANEL - Abnormal; Notable for the following components:      Result Value   BUN 34 (*)    Creatinine, Ser 1.51 (*)    GFR calc non Af Amer 29 (*)    GFR calc Af Amer 33 (*)    All other components within normal limits  CBC - Abnormal; Notable for the following components:   WBC 2.6 (*)    RBC 3.71 (*)    Hemoglobin 10.7 (*)    HCT 33.5 (*)    Platelets 142 (*)    All other components within normal limits  URINALYSIS, COMPLETE (UACMP) WITH MICROSCOPIC - Abnormal; Notable for the following components:   Color, Urine YELLOW (*)    APPearance HAZY (*)  Hgb urine dipstick SMALL (*)    Ketones, ur 5 (*)    Leukocytes,Ua SMALL (*)    Bacteria, UA RARE (*)    All other components within normal limits  LIPASE, BLOOD  LACTIC ACID, PLASMA   ____________________________________________  EKG  ED ECG REPORT I, Arta Silence, the attending physician, personally viewed and interpreted this ECG.  Date: 04/17/2020 EKG Time: 1456 Rate: 65 Rhythm: normal sinus rhythm QRS Axis: normal Intervals: Incomplete RBBB, LAFB ST/T Wave abnormalities: normal Narrative Interpretation: no evidence of acute ischemia  ____________________________________________  RADIOLOGY    ____________________________________________   PROCEDURES  Procedure(s) performed: No  Procedures  Critical Care performed: No ____________________________________________   INITIAL IMPRESSION / ASSESSMENT AND PLAN / ED COURSE  Pertinent labs & imaging results that were available during my care of the patient were reviewed by me and considered in my medical decision making (see chart for details).  86 year old female with PMH as noted above presents with  recurrent abdominal pain which has been present for at least a year in which she states happens almost every day.  She apparently had a worse episode than normal last night and today but it has now resolved.  She has no vomiting or diarrhea.  I reviewed the past medical records in [place].  Per her most recent PMD note from 5/17 the patient has a history of chronic abdominal pain and previous imaging shows plaque in the SMA causing stenosis, and this is thought to be the cause of the patient's chronic recurrent abdominal pain.  On exam currently, the patient is well-appearing for her age.  Her vital signs are normal except for hypertension.  The abdomen is soft and nontender.  There are no peritoneal signs.  Overall I suspect most likely an episode of the patient's chronic pain which could be related to chronic SMA stenosis.  The patient has no evidence of ongoing ischemia or acute intra-abdominal etiology of her pain.  Given the resolved pain and the lack of tenderness there is no evidence of colitis, diverticulitis, obstruction, volvulus, or other surgical etiology.  The patient has now been in the ED for almost 6 hours with no recurrence of the pain.  Lab work-up is consistent with the patient's baseline.  Although my clinical suspicion is very low given the resolved pain, I have added on a lactic acid to help rule out any ongoing ischemia.  If this is negative, there is no indication for imaging at this time.  ----------------------------------------- 9:42 PM on 04/17/2020 -----------------------------------------  Lactate is negative.  The patient remains pain-free.  She is stable for discharge at this time.  Return precautions given, she expresses understanding. ____________________________________________   FINAL CLINICAL IMPRESSION(S) / ED DIAGNOSES  Final diagnoses:  Lower abdominal pain      NEW MEDICATIONS STARTED DURING THIS VISIT:  New Prescriptions   No medications on file      Note:  This document was prepared using Dragon voice recognition software and may include unintentional dictation errors.    Arta Silence, MD 04/17/20 2142

## 2020-05-26 ENCOUNTER — Emergency Department (HOSPITAL_COMMUNITY)
Admission: EM | Admit: 2020-05-26 | Discharge: 2020-05-26 | Disposition: A | Discharge status: Home / Self Care | Payer: Medicare PPO

## 2020-05-26 ENCOUNTER — Other Ambulatory Visit: Payer: Self-pay

## 2020-06-06 ENCOUNTER — Other Ambulatory Visit: Payer: Self-pay | Admitting: Family Medicine

## 2020-06-06 DIAGNOSIS — N2581 Secondary hyperparathyroidism of renal origin: Secondary | ICD-10-CM

## 2020-06-06 NOTE — Telephone Encounter (Signed)
Requested medication (s) are due for refill today: yes  Requested medication (s) are on the active medication list: yes  Last refill:  01/13/20  Future visit scheduled: yes  Notes to clinic:  med not delegated to NT to RF   Requested Prescriptions  Pending Prescriptions Disp Refills   Vitamin D, Ergocalciferol, (DRISDOL) 1.25 MG (50000 UNIT) CAPS capsule [Pharmacy Med Name: VITAMIN D 1.25 MG (50000 UT) Capsule] 12 capsule 1    Sig: TAKE 1 CAPSULE BY MOUTH  ONCE WEEKLY      Endocrinology:  Vitamins - Vitamin D Supplementation Failed - 06/06/2020  9:46 AM      Failed - 50,000 IU strengths are not delegated      Failed - Phosphate in normal range and within 360 days    No results found for: PHOS        Failed - Vitamin D in normal range and within 360 days    Vit D, 25-Hydroxy  Date Value Ref Range Status  05/22/2018 48 30 - 100 ng/mL Final    Comment:    Vitamin D Status         25-OH Vitamin D: . Deficiency:                    <20 ng/mL Insufficiency:             20 - 29 ng/mL Optimal:                 > or = 30 ng/mL . For 25-OH Vitamin D testing on patients on  D2-supplementation and patients for whom quantitation  of D2 and D3 fractions is required, the QuestAssureD(TM) 25-OH VIT D, (D2,D3), LC/MS/MS is recommended: order  code 763-082-7654 (patients >40yrs). . For more information on this test, go to: http://education.questdiagnostics.com/faq/FAQ163 (This link is being provided for  informational/educational purposes only.)           Passed - Ca in normal range and within 360 days    Calcium  Date Value Ref Range Status  04/17/2020 9.2 8.9 - 10.3 mg/dL Final   Calcium, Total  Date Value Ref Range Status  11/22/2014 8.9 8.5 - 10.1 mg/dL Final          Passed - Valid encounter within last 12 months    Recent Outpatient Visits           3 months ago History of pulmonary embolism   [facility] [place], Drue Stager, MD   4 months ago Acute pulmonary  embolism without acute cor pulmonale, unspecified pulmonary embolism type [facility])   [facility] [place] Malkin, MD   7 months ago Acquired hypothyroidism   [facility] [place], Drue Stager, MD   11 months ago Benign hypertension with chronic kidney disease, stage III [facility])   [facility] Steele Sizer, MD   1 year ago Chronic constipation   [facility] Steele Sizer, MD       Future Appointments             In 2 days Steele Sizer, MD [facility], [facility]

## 2020-06-08 ENCOUNTER — Other Ambulatory Visit: Payer: Self-pay

## 2020-06-08 ENCOUNTER — Ambulatory Visit: Payer: Medicare PPO | Admitting: Family Medicine

## 2020-06-08 ENCOUNTER — Encounter: Payer: Self-pay | Admitting: Family Medicine

## 2020-06-08 VITALS — BP 140/80 | HR 98 | Temp 98.1°F | Resp 16 | Ht 62.0 in | Wt 119.7 lb

## 2020-06-08 DIAGNOSIS — E782 Mixed hyperlipidemia: Secondary | ICD-10-CM

## 2020-06-08 DIAGNOSIS — Z86711 Personal history of pulmonary embolism: Secondary | ICD-10-CM

## 2020-06-08 DIAGNOSIS — E039 Hypothyroidism, unspecified: Secondary | ICD-10-CM | POA: Diagnosis not present

## 2020-06-08 DIAGNOSIS — K8689 Other specified diseases of pancreas: Secondary | ICD-10-CM

## 2020-06-08 DIAGNOSIS — I700 Atherosclerosis of aorta: Secondary | ICD-10-CM | POA: Diagnosis not present

## 2020-06-08 DIAGNOSIS — N2581 Secondary hyperparathyroidism of renal origin: Secondary | ICD-10-CM

## 2020-06-08 DIAGNOSIS — F09 Unspecified mental disorder due to known physiological condition: Secondary | ICD-10-CM

## 2020-06-08 DIAGNOSIS — L299 Pruritus, unspecified: Secondary | ICD-10-CM | POA: Diagnosis not present

## 2020-06-08 DIAGNOSIS — Z86718 Personal history of other venous thrombosis and embolism: Secondary | ICD-10-CM

## 2020-06-08 DIAGNOSIS — M792 Neuralgia and neuritis, unspecified: Secondary | ICD-10-CM | POA: Diagnosis not present

## 2020-06-08 DIAGNOSIS — G894 Chronic pain syndrome: Secondary | ICD-10-CM

## 2020-06-08 DIAGNOSIS — F320 Major depressive disorder, single episode, mild: Secondary | ICD-10-CM

## 2020-06-08 DIAGNOSIS — F419 Anxiety disorder, unspecified: Secondary | ICD-10-CM

## 2020-06-08 DIAGNOSIS — N1832 Chronic kidney disease, stage 3b: Secondary | ICD-10-CM | POA: Diagnosis not present

## 2020-06-08 LAB — COMPLETE METABOLIC PANEL WITH GFR
AG Ratio: 1.6 (calc) (ref 1.0–2.5)
ALT: 8 U/L (ref 6–29)
AST: 16 U/L (ref 10–35)
Albumin: 3.9 g/dL (ref 3.6–5.1)
Alkaline phosphatase (APISO): 56 U/L (ref 37–153)
BUN/Creatinine Ratio: 19 (calc) (ref 6–22)
BUN: 35 mg/dL — ABNORMAL HIGH (ref 7–25)
CO2: 27 mmol/L (ref 20–32)
Calcium: 9.1 mg/dL (ref 8.6–10.4)
Chloride: 108 mmol/L (ref 98–110)
Creat: 1.81 mg/dL — ABNORMAL HIGH (ref 0.60–0.88)
GFR, Est African American: 27 mL/min/{1.73_m2} — ABNORMAL LOW (ref >=60)
GFR, Est Non African American: 23 mL/min/{1.73_m2} — ABNORMAL LOW (ref >=60)
Globulin: 2.5 g/dL (calc) (ref 1.9–3.7)
Glucose, Bld: 99 mg/dL (ref 65–99)
Potassium: 5.5 mmol/L — ABNORMAL HIGH (ref 3.5–5.3)
Sodium: 141 mmol/L (ref 135–146)
Total Bilirubin: 0.3 mg/dL (ref 0.2–1.2)
Total Protein: 6.4 g/dL (ref 6.1–8.1)

## 2020-06-08 LAB — CBC WITH DIFFERENTIAL/PLATELET
Absolute Monocytes: 245 cells/uL (ref 200–950)
Basophils Absolute: 20 cells/uL (ref 0–200)
Basophils Relative: 0.8 %
Eosinophils Absolute: 30 cells/uL (ref 15–500)
Eosinophils Relative: 1.2 %
HCT: 33 % — ABNORMAL LOW (ref 35.0–45.0)
Hemoglobin: 10.5 g/dL — ABNORMAL LOW (ref 11.7–15.5)
Lymphs Abs: 908 cells/uL (ref 850–3900)
MCH: 28.8 pg (ref 27.0–33.0)
MCHC: 31.8 g/dL — ABNORMAL LOW (ref 32.0–36.0)
MCV: 90.4 fL (ref 80.0–100.0)
MPV: 10.5 fL (ref 7.5–12.5)
Monocytes Relative: 9.8 %
Neutro Abs: 1298 cells/uL — ABNORMAL LOW (ref 1500–7800)
Neutrophils Relative %: 51.9 %
Platelets: 180 10*3/uL (ref 140–400)
RBC: 3.65 10*6/uL — ABNORMAL LOW (ref 3.80–5.10)
RDW: 12.5 % (ref 11.0–15.0)
Total Lymphocyte: 36.3 %
WBC: 2.5 10*3/uL — ABNORMAL LOW (ref 3.8–10.8)

## 2020-06-08 LAB — TSH: TSH: 0.03 mIU/L — ABNORMAL LOW (ref 0.40–4.50)

## 2020-06-08 MED ORDER — LEVOCETIRIZINE DIHYDROCHLORIDE 5 MG PO TABS: 5.0000 mg | ORAL_TABLET | Freq: Every evening | ORAL | 1 refills | Status: DC

## 2020-06-08 MED ORDER — HYDROXYZINE HCL 10 MG PO TABS: 10.0000 mg | ORAL_TABLET | Freq: Every evening | ORAL | 1 refills | Status: DC

## 2020-06-08 MED ORDER — SERTRALINE HCL 50 MG PO TABS: 50.0000 mg | ORAL_TABLET | Freq: Every day | ORAL | 1 refills | Status: DC

## 2020-06-08 MED ORDER — ACETAMINOPHEN-CODEINE #3 300-30 MG PO TABS: 1.0000 | ORAL_TABLET | ORAL | 0 refills | Status: DC | PRN

## 2020-06-08 NOTE — Progress Notes (Deleted)
Name: Lori Pacheco   MRN: [id number]    DOB: Aug 21, 1921   Date:06/08/2020       Progress Note  Subjective  Chief Complaint  Chief Complaint  Patient presents with  . Joint Pain    She said she is hurting all over especially in her back, feet and legs. She is in pain daily. She has been taking Tylenol to treat pain with no relief.    HPI  *** Patient Active Problem List   Diagnosis Date Noted  . Superior mesenteric artery stenosis ([place]) 03/02/2020  . Stage 3b chronic kidney disease 03/02/2020  . History of pulmonary embolism 02/07/2020  . Chronic abdominal pain 02/07/2020  . Depression with anxiety 02/07/2020  . Cognitive dysfunction 12/25/2018  . Varicose veins of leg with pain, bilateral 12/04/2018  . Deep vein thrombosis (DVT) of femoral vein of left lower extremity ([place]) 10/17/2018  . Other constipation 04/23/2018  . Leg cramps 04/23/2018  . Secondary renal hyperparathyroidism ([place]) 05/15/2017  . Atherosclerosis of abdominal aorta ([place]) 11/10/2016  . Perennial allergic rhinitis with seasonal variation 11/10/2016  . Vitamin D deficiency 03/29/2016  . Hypothyroidism 09/18/2015  . Essential hypertension 05/19/2015  . Hyperlipemia 05/19/2015    Past Surgical History:  Procedure Laterality Date  . NO PAST SURGERIES      Family History  Problem Relation Age of Onset  . Healthy Mother   . Healthy Father   . Heart disease Brother   . Cancer Brother   . Cancer Daughter   . Alcohol abuse Son   . Diabetes Son   . Heart disease Son     Social History   Tobacco Use  . Smoking status: Never Smoker  . Smokeless tobacco: Former Systems developer    Types: Snuff  . Tobacco comment: smoking cessation materials not required  Substance Use Topics  . Alcohol use: No    Alcohol/week: 0.0 standard drinks     Current Outpatient Medications:  .  acetaminophen (TYLENOL) 500 MG tablet, Take 1 tablet (500 mg total) by mouth every 6 (six) hours as needed., Disp: 100 tablet, Rfl: 0 .   amLODipine (NORVASC) 10 MG tablet, Take 1 tablet (10 mg total) by mouth daily., Disp: 90 tablet, Rfl: 1 .  Blood Glucose-BP Monitor (BLOOD GLUCOSE-WRIST BP MONITOR) DEVI, 1 each by Does not apply route daily., Disp: 1 each, Rfl: 0 .  hydrOXYzine (ATARAX/VISTARIL) 10 MG tablet, Take 1 tablet (10 mg total) by mouth 3 (three) times daily as needed for itching or anxiety., Disp: 90 tablet, Rfl: 0 .  levothyroxine (SYNTHROID) 112 MCG tablet, One daily and skip sundays (Patient taking differently: Take 112 mcg by mouth See admin instructions. Take 1 tablet (119mcg) by mouth every morning except Sunday), Disp: 90 tablet, Rfl: 1 .  lidocaine (LIDODERM) 5 %, Place 2 patches onto the skin every morning. And remove it at night (Patient taking differently: Place 1-2 patches onto the skin daily as needed (pain). (remove after 12 hours)), Disp: 180 patch, Rfl: 0 .  lisinopril (ZESTRIL) 20 MG tablet, Take 1 tablet (20 mg total) by mouth daily., Disp: 90 tablet, Rfl: 1 .  Vitamin D, Ergocalciferol, (DRISDOL) 1.25 MG (50000 UNIT) CAPS capsule, TAKE 1 CAPSULE BY MOUTH  ONCE WEEKLY (Patient taking differently: Take 50,000 Units by mouth every Sunday. ), Disp: 12 capsule, Rfl: 1 .  apixaban (ELIQUIS) 2.5 MG TABS tablet, Take 1 tablet (2.5 mg total) by mouth 2 (two) times daily. (Patient taking differently: Take 2.5 mg by  mouth daily. ), Disp: 180 tablet, Rfl: 1  Allergies  Allergen Reactions  . Lyrica [Pregabalin]     " I felt groggy"    I personally reviewed {Reviewed:14835} with the patient/caregiver today.   ROS  ***  Objective  Vitals:   06/08/20 1448  BP: 140/80  Pulse: 98  Resp: 16  Temp: 98.1 F (36.7 C)  TempSrc: Oral  SpO2: 100%  Weight: 119 lb 11.2 oz (54.3 kg)  Height: 5\' 2"  (1.575 m)    Body mass index is 21.89 kg/m.  Physical Exam  Constitutional: Patient appears well-developed and frail  No distress.  HEENT: head atraumatic, normocephalic, pupils equal and reactive to light, neck  supple Cardiovascular: Normal rate, regular rhythm and normal heart sounds.  No murmur heard. No BLE edema. Pulmonary/Chest: Effort normal and breath sounds normal. No respiratory distress. Abdominal: Soft.  There is no abdominal tenderness today, Muscular Skeletal: no synovitis , mild scoliosis no pain during palpation of spine Psychiatric: Patient has a normal mood and affect. behavior is normal. Judgment and thought content normal.  Recent Results (from the past 2160 hour(s))  Lipase, blood     Status: None   Collection Time: 04/17/20  3:07 PM  Result Value Ref Range   Lipase 49 11 - 51 U/L    Comment: Performed at [facility], [address] [place], [place] [address]  Comprehensive metabolic panel     Status: Abnormal   Collection Time: 04/17/20  3:07 PM  Result Value Ref Range   Sodium 139 135 - 145 mmol/L   Potassium 4.3 3.5 - 5.1 mmol/L   Chloride 104 98 - 111 mmol/L   CO2 22 22 - 32 mmol/L   Glucose, Bld 89 70 - 99 mg/dL    Comment: Glucose reference range applies only to samples taken after fasting for at least 8 hours.   BUN 34 (H) 8 - 23 mg/dL   Creatinine, Ser 1.51 (H) 0.44 - 1.00 mg/dL   Calcium 9.2 8.9 - 10.3 mg/dL   Total Protein 7.4 6.5 - 8.1 g/dL   Albumin 4.1 3.5 - 5.0 g/dL   AST 18 15 - 41 U/L   ALT 11 0 - 44 U/L   Alkaline Phosphatase 46 38 - 126 U/L   Total Bilirubin 1.1 0.3 - 1.2 mg/dL   GFR calc non Af Amer 29 (L) >60 mL/min   GFR calc Af Amer 33 (L) >60 mL/min   Anion gap 13 5 - 15    Comment: Performed at [facility], [address] [place], [place] [address]  CBC     Status: Abnormal   Collection Time: 04/17/20  3:07 PM  Result Value Ref Range   WBC 2.6 (L) 4.0 - 10.5 K/uL   RBC 3.71 (L) 3.87 - 5.11 MIL/uL   Hemoglobin 10.7 (L) 12.0 - 15.0 g/dL   HCT 33.5 (L) 36 - 46 %   MCV 90.3 80.0 - 100.0 fL   MCH 28.8 26.0 - 34.0 pg   MCHC 31.9 30.0 - 36.0 g/dL   RDW 13.2 11.5 - 15.5 %   Platelets 142 (L) 150 - 400 K/uL   nRBC 0.0  0.0 - 0.2 %    Comment: Performed at [facility], [address] [place], [place] [address]  Urinalysis, Complete w Microscopic     Status: Abnormal   Collection Time: 04/17/20  3:07 PM  Result Value Ref Range   Color, Urine YELLOW (A) YELLOW  APPearance HAZY (A) CLEAR   Specific Gravity, Urine 1.021 1.005 - 1.030   pH 5.0 5.0 - 8.0   Glucose, UA NEGATIVE NEGATIVE mg/dL   Hgb urine dipstick SMALL (A) NEGATIVE   Bilirubin Urine NEGATIVE NEGATIVE   Ketones, ur 5 (A) NEGATIVE mg/dL   Protein, ur NEGATIVE NEGATIVE mg/dL   Nitrite NEGATIVE NEGATIVE   Leukocytes,Ua SMALL (A) NEGATIVE   RBC / HPF 6-10 0 - 5 RBC/hpf   WBC, UA 0-5 0 - 5 WBC/hpf   Bacteria, UA RARE (A) NONE SEEN   Squamous Epithelial / LPF 0-5 0 - 5   Mucus PRESENT    Hyaline Casts, UA PRESENT     Comment: Performed at [facility], [address] [place], [place] [address]  Lactic acid, plasma     Status: None   Collection Time: 04/17/20  8:14 PM  Result Value Ref Range   Lactic Acid, Venous 1.0 0.5 - 1.9 mmol/L    Comment: Performed at [facility], [address] [place], [place] [address]    PHQ2/9: Depression screen [facility] 2/9 06/08/2020 03/02/2020 02/07/2020 11/01/2019 07/01/2019  Decreased Interest 1 1 0 0 2  Down, Depressed, Hopeless 1 0 1 3 2   PHQ - 2 Score 2 1 1 3 4   Altered sleeping 1 1 2 2 2   Tired, decreased energy 1 1 2 3 2   Change in appetite 1 2 2 3 3   Feeling bad or failure about yourself  1 0 0 2 3  Trouble concentrating 1 1 0 1 0  Moving slowly or fidgety/restless 0 1 1 1  0  Suicidal thoughts 1 0 0 0 0  PHQ-9 Score 8 7 8 15 14   Difficult doing work/chores - Not difficult at all Somewhat difficult Somewhat difficult Somewhat difficult  Some recent data might be hidden    phq 9 is positive   Fall Risk: Fall Risk  06/08/2020 02/07/2020 11/01/2019 07/01/2019 05/28/2019  Falls in the past year? 1 1 1  0 0  Comment - - - - -  Number falls in past yr: 1 0 0 0 0  Injury with  Fall? 0 0 0 0 0  Risk Factor Category  - - - - -  Comment - - - - -  Risk for fall due to : - - History of fall(s);Impaired balance/gait - -  Risk for fall due to: Comment - - - - -  Follow up - - - - -      Assessment & Plan  1. Pruritus *** - hydrOXYzine (ATARAX/VISTARIL) 10 MG tablet; Take 1 tablet (10 mg total) by mouth at bedtime.  Dispense: 90 tablet; Refill: 1 - levocetirizine (XYZAL) 5 MG tablet; Take 1 tablet (5 mg total) by mouth every evening.  Dispense: 90 tablet; Refill: 1  2. Anxiety *** - hydrOXYzine (ATARAX/VISTARIL) 10 MG tablet; Take 1 tablet (10 mg total) by mouth at bedtime.  Dispense: 90 tablet; Refill: 1 - sertraline (ZOLOFT) 50 MG tablet; Take 1 tablet (50 mg total) by mouth daily.  Dispense: 30 tablet; Refill: 1  3. Neuropathic pain of flank, right ***  4. History of pulmonary embolism ***  5. Acquired hypothyroidism ***  6. Mild major depression (HCC) *** - sertraline (ZOLOFT) 50 MG tablet; Take 1 tablet (50 mg total) by mouth daily.  Dispense: 30 tablet; Refill: 1  7. Atherosclerosis of abdominal aorta (HCC) ***  8. Cognitive dysfunction ***  9. Stage 3b chronic kidney disease *** - CBC  with Differential/Platelet - COMPLETE METABOLIC PANEL WITH GFR  10. Secondary renal hyperparathyroidism (HCC) ***  11. Mixed hyperlipidemia ***  12. Hypothyroidism, unspecified type *** - TSH  13. History of DVT in adulthood ***  14. Chronic pain syndrome *** - acetaminophen-codeine (TYLENOL #3) 300-30 MG tablet; Take 1 tablet by mouth every 4 (four) hours as needed for moderate pain.  Dispense: 30 tablet; Refill: 0  15. Pancreatic mass ***

## 2020-06-08 NOTE — Progress Notes (Deleted)
Name: Lori Pacheco   MRN: [id number]    DOB: 01-25-1921   Date:06/08/2020       Progress Note  Subjective  Chief Complaint  Chief Complaint  Patient presents with  . Joint Pain    She said she is hurting all over especially in her back, feet and legs. She is in pain daily. She has been taking Tylenol to treat pain with no relief.    HPI  CT abdomen done [facility] in 2015   1. No evidence of bowel ischemia. 2. Prominent calcified atheromatous plaque at the origin of the celiac artery and SMA resulting in mild-moderate stenosis, as above, but the distal branches remain patent. 3. Small amount of free fluid in the pelvis of unknown etiology.   Repeat CT abdomen at [facility] 01/2020   1. Right lower lobe pulmonary embolism. This was discussed with the ordering ER physician. 2. 3.3 x 1.1 cm hypoattenuating lesion within the pancreatic head, favored to represent a sidebranch IPMN. In the setting of advanced age, no definite follow-up is necessary, however, this may be further evaluated with MRI/MRCP as clinically desired. 3. Small-volume abdominal pelvic ascites with diffuse mesenteric edema. Findings are nonspecific without identifiable cause by CT. 4. No evidence of bowel obstruction as clinically questioned.  DVT left leg: diagnosed during hospital stay, for some reason she is down to 2.5 mg once a day, explained treatment needs to be for at least 6 months at 2.5 mg BID.  Patient Active Problem List   Diagnosis Date Noted  . Superior mesenteric artery stenosis ([place]) 03/02/2020  . Stage 3b chronic kidney disease 03/02/2020  . History of pulmonary embolism 02/07/2020  . Chronic abdominal pain 02/07/2020  . Depression with anxiety 02/07/2020  . Cognitive dysfunction 12/25/2018  . Varicose veins of leg with pain, bilateral 12/04/2018  . Deep vein thrombosis (DVT) of femoral vein of left lower extremity ([place]) 10/17/2018  . Other constipation 04/23/2018  . Leg cramps 04/23/2018  .  Secondary renal hyperparathyroidism ([place]) 05/15/2017  . Atherosclerosis of abdominal aorta ([place]) 11/10/2016  . Perennial allergic rhinitis with seasonal variation 11/10/2016  . Vitamin D deficiency 03/29/2016  . Hypothyroidism 09/18/2015  . Essential hypertension 05/19/2015  . Hyperlipemia 05/19/2015    Past Surgical History:  Procedure Laterality Date  . NO PAST SURGERIES      Family History  Problem Relation Age of Onset  . Healthy Mother   . Healthy Father   . Heart disease Brother   . Cancer Brother   . Cancer Daughter   . Alcohol abuse Son   . Diabetes Son   . Heart disease Son     Social History   Tobacco Use  . Smoking status: Never Smoker  . Smokeless tobacco: Former Systems developer    Types: Snuff  . Tobacco comment: smoking cessation materials not required  Substance Use Topics  . Alcohol use: No    Alcohol/week: 0.0 standard drinks     Current Outpatient Medications:  .  acetaminophen (TYLENOL) 500 MG tablet, Take 1 tablet (500 mg total) by mouth every 6 (six) hours as needed., Disp: 100 tablet, Rfl: 0 .  amLODipine (NORVASC) 10 MG tablet, Take 1 tablet (10 mg total) by mouth daily., Disp: 90 tablet, Rfl: 1 .  Blood Glucose-BP Monitor (BLOOD GLUCOSE-WRIST BP MONITOR) DEVI, 1 each by Does not apply route daily., Disp: 1 each, Rfl: 0 .  hydrOXYzine (ATARAX/VISTARIL) 10 MG tablet, Take 1 tablet (10 mg total) by mouth 3 (three) times  daily as needed for itching or anxiety., Disp: 90 tablet, Rfl: 0 .  levothyroxine (SYNTHROID) 112 MCG tablet, One daily and skip sundays (Patient taking differently: Take 112 mcg by mouth See admin instructions. Take 1 tablet (158mcg) by mouth every morning except Sunday), Disp: 90 tablet, Rfl: 1 .  lidocaine (LIDODERM) 5 %, Place 2 patches onto the skin every morning. And remove it at night (Patient taking differently: Place 1-2 patches onto the skin daily as needed (pain). (remove after 12 hours)), Disp: 180 patch, Rfl: 0 .  lisinopril  (ZESTRIL) 20 MG tablet, Take 1 tablet (20 mg total) by mouth daily., Disp: 90 tablet, Rfl: 1 .  Vitamin D, Ergocalciferol, (DRISDOL) 1.25 MG (50000 UNIT) CAPS capsule, TAKE 1 CAPSULE BY MOUTH  ONCE WEEKLY (Patient taking differently: Take 50,000 Units by mouth every Sunday. ), Disp: 12 capsule, Rfl: 1 .  apixaban (ELIQUIS) 2.5 MG TABS tablet, Take 1 tablet (2.5 mg total) by mouth 2 (two) times daily. (Patient taking differently: Take 2.5 mg by mouth daily. ), Disp: 180 tablet, Rfl: 1  Allergies  Allergen Reactions  . Lyrica [Pregabalin]     " I felt groggy"    I personally reviewed {Reviewed:14835} with the patient/caregiver today.   ROS  ***  Objective  There were no vitals filed for this visit.  There is no height or weight on file to calculate BMI.  Physical Exam ***  Recent Results (from the past 2160 hour(s))  Lipase, blood     Status: None   Collection Time: 04/17/20  3:07 PM  Result Value Ref Range   Lipase 49 11 - 51 U/L    Comment: Performed at [facility], [address] [place], [place] [address]  Comprehensive metabolic panel     Status: Abnormal   Collection Time: 04/17/20  3:07 PM  Result Value Ref Range   Sodium 139 135 - 145 mmol/L   Potassium 4.3 3.5 - 5.1 mmol/L   Chloride 104 98 - 111 mmol/L   CO2 22 22 - 32 mmol/L   Glucose, Bld 89 70 - 99 mg/dL    Comment: Glucose reference range applies only to samples taken after fasting for at least 8 hours.   BUN 34 (H) 8 - 23 mg/dL   Creatinine, Ser 1.51 (H) 0.44 - 1.00 mg/dL   Calcium 9.2 8.9 - 10.3 mg/dL   Total Protein 7.4 6.5 - 8.1 g/dL   Albumin 4.1 3.5 - 5.0 g/dL   AST 18 15 - 41 U/L   ALT 11 0 - 44 U/L   Alkaline Phosphatase 46 38 - 126 U/L   Total Bilirubin 1.1 0.3 - 1.2 mg/dL   GFR calc non Af Amer 29 (L) >60 mL/min   GFR calc Af Amer 33 (L) >60 mL/min   Anion gap 13 5 - 15    Comment: Performed at [facility], [address] [place], [place] [address]  CBC     Status:  Abnormal   Collection Time: 04/17/20  3:07 PM  Result Value Ref Range   WBC 2.6 (L) 4.0 - 10.5 K/uL   RBC 3.71 (L) 3.87 - 5.11 MIL/uL   Hemoglobin 10.7 (L) 12.0 - 15.0 g/dL   HCT 33.5 (L) 36 - 46 %   MCV 90.3 80.0 - 100.0 fL   MCH 28.8 26.0 - 34.0 pg   MCHC 31.9 30.0 - 36.0 g/dL   RDW 13.2 11.5 - 15.5 %   Platelets 142 (L) 150 -  400 K/uL   nRBC 0.0 0.0 - 0.2 %    Comment: Performed at [facility], [address] [place], [place] [address]  Urinalysis, Complete w Microscopic     Status: Abnormal   Collection Time: 04/17/20  3:07 PM  Result Value Ref Range   Color, Urine YELLOW (A) YELLOW   APPearance HAZY (A) CLEAR   Specific Gravity, Urine 1.021 1.005 - 1.030   pH 5.0 5.0 - 8.0   Glucose, UA NEGATIVE NEGATIVE mg/dL   Hgb urine dipstick SMALL (A) NEGATIVE   Bilirubin Urine NEGATIVE NEGATIVE   Ketones, ur 5 (A) NEGATIVE mg/dL   Protein, ur NEGATIVE NEGATIVE mg/dL   Nitrite NEGATIVE NEGATIVE   Leukocytes,Ua SMALL (A) NEGATIVE   RBC / HPF 6-10 0 - 5 RBC/hpf   WBC, UA 0-5 0 - 5 WBC/hpf   Bacteria, UA RARE (A) NONE SEEN   Squamous Epithelial / LPF 0-5 0 - 5   Mucus PRESENT    Hyaline Casts, UA PRESENT     Comment: Performed at [facility], [address] [place], [place] [address]  Lactic acid, plasma     Status: None   Collection Time: 04/17/20  8:14 PM  Result Value Ref Range   Lactic Acid, Venous 1.0 0.5 - 1.9 mmol/L    Comment: Performed at [facility], [address], [place], [place] [address]    Diabetic Foot Exam: Diabetic Foot Exam - Simple   No data filed     ***  PHQ2/9: Depression screen [facility] 2/9 06/08/2020 03/02/2020 02/07/2020 11/01/2019 07/01/2019  Decreased Interest 1 1 0 0 2  Down, Depressed, Hopeless 1 0 1 3 2   PHQ - 2 Score 2 1 1 3 4   Altered sleeping 1 1 2 2 2   Tired, decreased energy 1 1 2 3 2   Change in appetite 1 2 2 3 3   Feeling bad or failure about yourself  1 0 0 2 3  Trouble concentrating 1 1 0 1 0  Moving  slowly or fidgety/restless 0 1 1 1  0  Suicidal thoughts 1 0 0 0 0  PHQ-9 Score 8 7 8 15 14   Difficult doing work/chores - Not difficult at all Somewhat difficult Somewhat difficult Somewhat difficult  Some recent data might be hidden    phq 9 is {gen pos XNA:355732} ***  Fall Risk: Fall Risk  06/08/2020 02/07/2020 11/01/2019 07/01/2019 05/28/2019  Falls in the past year? 1 1 1  0 0  Comment - - - - -  Number falls in past yr: 1 0 0 0 0  Injury with Fall? 0 0 0 0 0  Risk Factor Category  - - - - -  Comment - - - - -  Risk for fall due to : - - History of fall(s);Impaired balance/gait - -  Risk for fall due to: Comment - - - - -  Follow up - - - - -   ***   Functional Status Survey:   ***   Assessment & Plan  *** There are no diagnoses linked to this encounter.

## 2020-06-09 NOTE — Progress Notes (Signed)
Name: Lori Pacheco   MRN: [id number]    DOB: May 01, 1921   Date:06/10/2020       Progress Note  Subjective  Chief Complaint  Chief Complaint  Patient presents with   Joint Pain    She said she is hurting all over especially in her back, feet and legs. She is in pain daily. She has been taking Tylenol to treat pain with no relief.    HPI  Name: Lori Pacheco   MRN: [id number]    DOB: [birth year]   Date:06/08/2020       Progress Note  Subjective  HPI  Patient came in today with her daughter ( caregiver) , Ms. Slocumb and daughter are aware of her advanced age and multiple medical problems that are not managed such as CKI and also MGUS and increase in cognitive dysfunction.    Ms. Barman continues to go to [facility] with complaints of upper abdominal pain, lower back pain and sometimes generalized body aches. Her daughter states she gives her Tylenol at times and seems to help sometimes the pain increases and they take her to the [facility].   She has generalized pruritus, explained it may be from [facility] that is not being treated. Advised to just use topical lotion and if needed may add hydrocortisone when severe however it will likely not improve much, she also has hydroxyzine for anxiety that may help with itching   CKI with secondary hyperparathyroidism: not willing to see nephrologist at this time, we will only check simple labs today   CT abdomen done [facility] in 2015   1. No evidence of bowel ischemia. 2. Prominent calcified atheromatous plaque at the origin of the celiac artery and SMA resulting in mild-moderate stenosis, as above, but the distal branches remain patent. 3. Small amount of free fluid in the pelvis of unknown etiology.   Repeat CT abdomen at [facility] 01/2020   1. Right lower lobe pulmonary embolism. This was discussed with the ordering ER physician. 2. 3.3 x 1.1 cm hypoattenuating lesion within the pancreatic head, favored to represent a sidebranch IPMN. In the setting of advanced  age, no definite follow-up is necessary, however, this may be further evaluated with MRI/MRCP as clinically desired. 3. Small-volume abdominal pelvic ascites with diffuse mesenteric edema. Findings are nonspecific without identifiable cause by CT. 4. No evidence of bowel obstruction as clinically questioned.  DVT left leg: diagnosed during hospital stay, for some reason she is down to 2.5 mg once a day, explained treatment needs to be for at least 6 months at 2.5 mg BID, but daughter is still giving her just one tablet day. She denies calf pain or SOB    MDD: daughter states she needs reassurance at times, seems nervous at times, willing to take medication  Atherosclerosis of aorta: on eliquis, does not want any other medication such as statin therapy, advanced medical problems   Pancreatic mass: found on CT abdomen, daughter or patient are aware of the findings of pancreatic mass on CT done at [facility] showed a mass, since she has a constant epigastric pain and back pain and because of advanced age, would not tolerate therapy, we will try treating her with some pan medication, she denies nausea  PANCREAS: Lobulated hypoattenuating lesion within the pancreatic head measuring 3.3 x 1.1 cm (4:26). Top differential consideration would include a sidebranch IPMN.   Patient Active Problem List   Diagnosis Date Noted   Superior mesenteric artery stenosis ([place]) 03/02/2020   Stage 3b  chronic kidney disease 03/02/2020   History of pulmonary embolism 02/07/2020   Chronic abdominal pain 02/07/2020   Depression with anxiety 02/07/2020   Cognitive dysfunction 12/25/2018   Varicose veins of leg with pain, bilateral 12/04/2018   Deep vein thrombosis (DVT) of femoral vein of left lower extremity ([place]) 10/17/2018   Other constipation 04/23/2018   Leg cramps 04/23/2018   Secondary renal hyperparathyroidism ([place]) 05/15/2017   Atherosclerosis of abdominal aorta ([place]) 11/10/2016   Perennial allergic  rhinitis with seasonal variation 11/10/2016   Vitamin D deficiency 03/29/2016   Hypothyroidism 09/18/2015   Essential hypertension 05/19/2015   Hyperlipemia 05/19/2015    Past Surgical History:  Procedure Laterality Date   NO PAST SURGERIES      Family History  Problem Relation Age of Onset   Healthy Mother    Healthy Father    Heart disease Brother    Cancer Brother    Cancer Daughter    Alcohol abuse Son    Diabetes Son    Heart disease Son     Social History   Tobacco Use   Smoking status: Never Smoker   Smokeless tobacco: Former Systems developer    Types: Snuff   Tobacco comment: smoking cessation materials not required  Substance Use Topics   Alcohol use: No    Alcohol/week: 0.0 standard drinks     Current Outpatient Medications:    acetaminophen (TYLENOL) 500 MG tablet, Take 1 tablet (500 mg total) by mouth every 6 (six) hours as needed., Disp: 100 tablet, Rfl: 0   amLODipine (NORVASC) 10 MG tablet, Take 1 tablet (10 mg total) by mouth daily., Disp: 90 tablet, Rfl: 1   Blood Glucose-BP Monitor (BLOOD GLUCOSE-WRIST BP MONITOR) DEVI, 1 each by Does not apply route daily., Disp: 1 each, Rfl: 0   hydrOXYzine (ATARAX/VISTARIL) 10 MG tablet, Take 1 tablet (10 mg total) by mouth 3 (three) times daily as needed for itching or anxiety., Disp: 90 tablet, Rfl: 0   levothyroxine (SYNTHROID) 112 MCG tablet, One daily and skip sundays (Patient taking differently: Take 112 mcg by mouth See admin instructions. Take 1 tablet (166mcg) by mouth every morning except Sunday), Disp: 90 tablet, Rfl: 1   lidocaine (LIDODERM) 5 %, Place 2 patches onto the skin every morning. And remove it at night (Patient taking differently: Place 1-2 patches onto the skin daily as needed (pain). (remove after 12 hours)), Disp: 180 patch, Rfl: 0   lisinopril (ZESTRIL) 20 MG tablet, Take 1 tablet (20 mg total) by mouth daily., Disp: 90 tablet, Rfl: 1   Vitamin D, Ergocalciferol, (DRISDOL) 1.25  MG (50000 UNIT) CAPS capsule, TAKE 1 CAPSULE BY MOUTH  ONCE WEEKLY (Patient taking differently: Take 50,000 Units by mouth every Sunday. ), Disp: 12 capsule, Rfl: 1   apixaban (ELIQUIS) 2.5 MG TABS tablet, Take 1 tablet (2.5 mg total) by mouth 2 (two) times daily. (Patient taking differently: Take 2.5 mg by mouth daily. ), Disp: 180 tablet, Rfl: 1  Allergies  Allergen Reactions   Lyrica [Pregabalin]     " I felt groggy"    I personally reviewed active problem list, medication list, allergies, family history with the patient/caregiver today.    Patient Active Problem List   Diagnosis Date Noted   Superior mesenteric artery stenosis ([place]) 03/02/2020   Stage 3b chronic kidney disease 03/02/2020   History of pulmonary embolism 02/07/2020   Chronic abdominal pain 02/07/2020   Depression with anxiety 02/07/2020   Cognitive dysfunction 12/25/2018   Varicose veins  of leg with pain, bilateral 12/04/2018   Deep vein thrombosis (DVT) of femoral vein of left lower extremity ([place]) 10/17/2018   Other constipation 04/23/2018   Leg cramps 04/23/2018   Secondary renal hyperparathyroidism ([place]) 05/15/2017   Atherosclerosis of abdominal aorta ([place]) 11/10/2016   Perennial allergic rhinitis with seasonal variation 11/10/2016   Vitamin D deficiency 03/29/2016   Hypothyroidism 09/18/2015   Essential hypertension 05/19/2015   Hyperlipemia 05/19/2015    Past Surgical History:  Procedure Laterality Date   NO PAST SURGERIES      Family History  Problem Relation Age of Onset   Healthy Mother    Healthy Father    Heart disease Brother    Cancer Brother    Cancer Daughter    Alcohol abuse Son    Diabetes Son    Heart disease Son     Social History   Tobacco Use   Smoking status: Never Smoker   Smokeless tobacco: Former Systems developer    Types: Snuff   Tobacco comment: smoking cessation materials not required  Substance Use Topics   Alcohol use: No    Alcohol/week:  0.0 standard drinks     Current Outpatient Medications:    acetaminophen (TYLENOL) 500 MG tablet, Take 1 tablet (500 mg total) by mouth every 6 (six) hours as needed., Disp: 100 tablet, Rfl: 0   amLODipine (NORVASC) 10 MG tablet, Take 1 tablet (10 mg total) by mouth daily., Disp: 90 tablet, Rfl: 1   Blood Glucose-BP Monitor (BLOOD GLUCOSE-WRIST BP MONITOR) DEVI, 1 each by Does not apply route daily., Disp: 1 each, Rfl: 0   hydrOXYzine (ATARAX/VISTARIL) 10 MG tablet, Take 1 tablet (10 mg total) by mouth at bedtime., Disp: 90 tablet, Rfl: 1   levothyroxine (SYNTHROID) 112 MCG tablet, One daily and skip sundays (Patient taking differently: Take 112 mcg by mouth See admin instructions. Take 1 tablet (153mcg) by mouth every morning except Sunday), Disp: 90 tablet, Rfl: 1   lidocaine (LIDODERM) 5 %, Place 2 patches onto the skin every morning. And remove it at night (Patient taking differently: Place 1-2 patches onto the skin daily as needed (pain). (remove after 12 hours)), Disp: 180 patch, Rfl: 0   lisinopril (ZESTRIL) 20 MG tablet, Take 1 tablet (20 mg total) by mouth daily., Disp: 90 tablet, Rfl: 1   Vitamin D, Ergocalciferol, (DRISDOL) 1.25 MG (50000 UNIT) CAPS capsule, TAKE 1 CAPSULE BY MOUTH  ONCE WEEKLY (Patient taking differently: Take 50,000 Units by mouth every Sunday. ), Disp: 12 capsule, Rfl: 1   acetaminophen-codeine (TYLENOL #3) 300-30 MG tablet, Take 1 tablet by mouth every 4 (four) hours as needed for moderate pain., Disp: 30 tablet, Rfl: 0   apixaban (ELIQUIS) 2.5 MG TABS tablet, Take 1 tablet (2.5 mg total) by mouth 2 (two) times daily. (Patient taking differently: Take 2.5 mg by mouth daily. ), Disp: 180 tablet, Rfl: 1   levocetirizine (XYZAL) 5 MG tablet, Take 1 tablet (5 mg total) by mouth every evening., Disp: 90 tablet, Rfl: 1   sertraline (ZOLOFT) 50 MG tablet, Take 1 tablet (50 mg total) by mouth daily., Disp: 30 tablet, Rfl: 1  Allergies  Allergen Reactions    Lyrica [Pregabalin]     " I felt groggy"     Objective  Vitals:   06/08/20 1448  BP: 140/80  Pulse: 98  Resp: 16  Temp: 98.1 F (36.7 C)  TempSrc: Oral  SpO2: 100%  Weight: 119 lb 11.2 oz (54.3 kg)  Height: 5\' 2"  (  1.575 m)    Body mass index is 21.89 kg/m.  Physical Exam  Constitutional: Patient appears frail and thin. No distress.  HEENT: head atraumatic, normocephalic, pupils equal and reactive to light, neck supple Cardiovascular: Normal rate, regular rhythm and normal heart sounds.  No murmur heard. No BLE edema, a lot of varicose veins, but no pain during compression of calves  Pulmonary/Chest: Effort normal and breath sounds normal. No respiratory distress. Abdominal: Soft.  There is epigastric  tenderness. Psychiatric: Patient appears a little anxious at times. behavior is normal.   Recent Results (from the past 2160 hour(s))  Lipase, blood     Status: None   Collection Time: 04/17/20  3:07 PM  Result Value Ref Range   Lipase 49 11 - 51 U/L    Comment: Performed at [facility], [address] [place], [place] [address]  Comprehensive metabolic panel     Status: Abnormal   Collection Time: 04/17/20  3:07 PM  Result Value Ref Range   Sodium 139 135 - 145 mmol/L   Potassium 4.3 3.5 - 5.1 mmol/L   Chloride 104 98 - 111 mmol/L   CO2 22 22 - 32 mmol/L   Glucose, Bld 89 70 - 99 mg/dL    Comment: Glucose reference range applies only to samples taken after fasting for at least 8 hours.   BUN 34 (H) 8 - 23 mg/dL   Creatinine, Ser 1.51 (H) 0.44 - 1.00 mg/dL   Calcium 9.2 8.9 - 10.3 mg/dL   Total Protein 7.4 6.5 - 8.1 g/dL   Albumin 4.1 3.5 - 5.0 g/dL   AST 18 15 - 41 U/L   ALT 11 0 - 44 U/L   Alkaline Phosphatase 46 38 - 126 U/L   Total Bilirubin 1.1 0.3 - 1.2 mg/dL   GFR calc non Af Amer 29 (L) >60 mL/min   GFR calc Af Amer 33 (L) >60 mL/min   Anion gap 13 5 - 15    Comment: Performed at [facility], [address] [place], [place]  [address]  CBC     Status: Abnormal   Collection Time: 04/17/20  3:07 PM  Result Value Ref Range   WBC 2.6 (L) 4.0 - 10.5 K/uL   RBC 3.71 (L) 3.87 - 5.11 MIL/uL   Hemoglobin 10.7 (L) 12.0 - 15.0 g/dL   HCT 33.5 (L) 36 - 46 %   MCV 90.3 80.0 - 100.0 fL   MCH 28.8 26.0 - 34.0 pg   MCHC 31.9 30.0 - 36.0 g/dL   RDW 13.2 11.5 - 15.5 %   Platelets 142 (L) 150 - 400 K/uL   nRBC 0.0 0.0 - 0.2 %    Comment: Performed at [facility], [address] [place], [place] [address]  Urinalysis, Complete w Microscopic     Status: Abnormal   Collection Time: 04/17/20  3:07 PM  Result Value Ref Range   Color, Urine YELLOW (A) YELLOW   APPearance HAZY (A) CLEAR   Specific Gravity, Urine 1.021 1.005 - 1.030   pH 5.0 5.0 - 8.0   Glucose, UA NEGATIVE NEGATIVE mg/dL   Hgb urine dipstick SMALL (A) NEGATIVE   Bilirubin Urine NEGATIVE NEGATIVE   Ketones, ur 5 (A) NEGATIVE mg/dL   Protein, ur NEGATIVE NEGATIVE mg/dL   Nitrite NEGATIVE NEGATIVE   Leukocytes,Ua SMALL (A) NEGATIVE   RBC / HPF 6-10 0 - 5 RBC/hpf   WBC, UA 0-5 0 - 5 WBC/hpf   Bacteria,  UA RARE (A) NONE SEEN   Squamous Epithelial / LPF 0-5 0 - 5   Mucus PRESENT    Hyaline Casts, UA PRESENT     Comment: Performed at [facility], [address] [place], [place] [address]  Lactic acid, plasma     Status: None   Collection Time: 04/17/20  8:14 PM  Result Value Ref Range   Lactic Acid, Venous 1.0 0.5 - 1.9 mmol/L    Comment: Performed at [facility], [address] [place], [place] [address]  CBC with Differential/Platelet     Status: Abnormal   Collection Time: 06/08/20  3:26 PM  Result Value Ref Range   WBC 2.5 (L) 3.8 - 10.8 Thousand/uL   RBC 3.65 (L) 3.80 - 5.10 Million/uL   Hemoglobin 10.5 (L) 11.7 - 15.5 g/dL   HCT 33.0 (L) 35 - 45 %   MCV 90.4 80.0 - 100.0 fL   MCH 28.8 27.0 - 33.0 pg   MCHC 31.8 (L) 32.0 - 36.0 g/dL   RDW 12.5 11.0 - 15.0 %   Platelets 180 140 - 400 Thousand/uL   MPV 10.5 7.5 -  12.5 fL   Neutro Abs 1,298 (L) 1,500 - 7,800 cells/uL   Lymphs Abs 908 850 - 3,900 cells/uL   Absolute Monocytes 245 200 - 950 cells/uL   Eosinophils Absolute 30 15 - 500 cells/uL   Basophils Absolute 20 0 - 200 cells/uL   Neutrophils Relative % 51.9 %   Total Lymphocyte 36.3 %   Monocytes Relative 9.8 %   Eosinophils Relative 1.2 %   Basophils Relative 0.8 %  COMPLETE METABOLIC PANEL WITH GFR     Status: Abnormal   Collection Time: 06/08/20  3:26 PM  Result Value Ref Range   Glucose, Bld 99 65 - 99 mg/dL    Comment: .            Fasting reference interval .    BUN 35 (H) 7 - 25 mg/dL   Creat 1.81 (H) 0.60 - 0.88 mg/dL    Comment: For patients >28 years of age, the reference limit for Creatinine is approximately 13% higher for people identified as African-American. .    GFR, Est Non African American 23 (L) > OR = 60 mL/min/1.36m2   GFR, Est African American 27 (L) > OR = 60 mL/min/1.43m2   BUN/Creatinine Ratio 19 6 - 22 (calc)   Sodium 141 135 - 146 mmol/L   Potassium 5.5 (H) 3.5 - 5.3 mmol/L   Chloride 108 98 - 110 mmol/L   CO2 27 20 - 32 mmol/L   Calcium 9.1 8.6 - 10.4 mg/dL   Total Protein 6.4 6.1 - 8.1 g/dL   Albumin 3.9 3.6 - 5.1 g/dL   Globulin 2.5 1.9 - 3.7 g/dL (calc)   AG Ratio 1.6 1.0 - 2.5 (calc)   Total Bilirubin 0.3 0.2 - 1.2 mg/dL   Alkaline phosphatase (APISO) 56 37 - 153 U/L   AST 16 10 - 35 U/L   ALT 8 6 - 29 U/L  TSH     Status: Abnormal   Collection Time: 06/08/20  3:26 PM  Result Value Ref Range   TSH 0.03 (L) 0.40 - 4.50 mIU/L     PHQ2/9: Depression screen [facility] 2/9 06/08/2020 03/02/2020 02/07/2020 11/01/2019 07/01/2019  Decreased Interest 1 1 0 0 2  Down, Depressed, Hopeless 1 0 1 3 2   PHQ - 2 Score 2 1 1 3 4   Altered sleeping 1 1  2 2 2   Tired, decreased energy 1 1 2 3 2   Change in appetite 1 2 2 3 3   Feeling bad or failure about yourself  1 0 0 2 3  Trouble concentrating 1 1 0 1 0  Moving slowly or fidgety/restless 0 1 1 1  0  Suicidal  thoughts 1 0 0 0 0  PHQ-9 Score 8 7 8 15 14   Difficult doing work/chores - Not difficult at all Somewhat difficult Somewhat difficult Somewhat difficult  Some recent data might be hidden    phq 9 is positive  Fall Risk: Fall Risk  06/08/2020 02/07/2020 11/01/2019 07/01/2019 05/28/2019  Falls in the past year? 1 1 1  0 0  Comment - - - - -  Number falls in past yr: 1 0 0 0 0  Injury with Fall? 0 0 0 0 0  Risk Factor Category  - - - - -  Comment - - - - -  Risk for fall due to : - - History of fall(s);Impaired balance/gait - -  Risk for fall due to: Comment - - - - -  Follow up - - - - -      Assessment & Plan   1. Pruritus  - hydrOXYzine (ATARAX/VISTARIL) 10 MG tablet; Take 1 tablet (10 mg total) by mouth at bedtime.  Dispense: 90 tablet; Refill: 1 - levocetirizine (XYZAL) 5 MG tablet; Take 1 tablet (5 mg total) by mouth every evening.  Dispense: 90 tablet; Refill: 1  2. Anxiety  - hydrOXYzine (ATARAX/VISTARIL) 10 MG tablet; Take 1 tablet (10 mg total) by mouth at bedtime.  Dispense: 90 tablet; Refill: 1 - sertraline (ZOLOFT) 50 MG tablet; Take 1 tablet (50 mg total) by mouth daily.  Dispense: 30 tablet; Refill: 1  3. Neuropathic pain of flank, right   4. History of pulmonary embolism   5. Acquired hypothyroidism  Recheck TSH  6. Mild major depression (HCC)  - sertraline (ZOLOFT) 50 MG tablet; Take 1 tablet (50 mg total) by mouth daily.  Dispense: 30 tablet; Refill: 1  7. Atherosclerosis of abdominal aorta (HCC)  Not on statin since age 43 and not interested   28. Cognitive dysfunction  stable  9. Stage 3b chronic kidney disease  - CBC with Differential/Platelet - COMPLETE METABOLIC PANEL WITH GFR  10. Secondary renal hyperparathyroidism ([place])   11. Mixed hyperlipidemia   12. Hypothyroidism, unspecified type  - TSH  13. History of DVT in adulthood  Not taking Eliquis correctly  14. Chronic pain syndrome  To prevent visits to EC  -  acetaminophen-codeine (TYLENOL #3) 300-30 MG tablet; Take 1 tablet by mouth every 4 (four) hours as needed for moderate pain.  Dispense: 30 tablet; Refill: 0  15. Pancreatic mass  Not interested in further referrals or evaluation

## 2020-06-10 ENCOUNTER — Other Ambulatory Visit: Payer: Self-pay | Admitting: Family Medicine

## 2020-06-10 DIAGNOSIS — R109 Unspecified abdominal pain: Secondary | ICD-10-CM

## 2020-06-10 DIAGNOSIS — I82409 Acute embolism and thrombosis of unspecified deep veins of unspecified lower extremity: Secondary | ICD-10-CM

## 2020-06-10 DIAGNOSIS — K8689 Other specified diseases of pancreas: Secondary | ICD-10-CM

## 2020-06-10 DIAGNOSIS — F09 Unspecified mental disorder due to known physiological condition: Secondary | ICD-10-CM

## 2020-06-10 DIAGNOSIS — N2581 Secondary hyperparathyroidism of renal origin: Secondary | ICD-10-CM

## 2020-06-12 ENCOUNTER — Encounter: Payer: Self-pay | Admitting: Family Medicine

## 2020-07-06 ENCOUNTER — Other Ambulatory Visit: Payer: Self-pay | Admitting: Family Medicine

## 2020-07-06 DIAGNOSIS — N2581 Secondary hyperparathyroidism of renal origin: Secondary | ICD-10-CM

## 2020-07-06 NOTE — Telephone Encounter (Signed)
Requested medication (s) are due for refill today: Yes  Requested medication (s) are on the active medication list: Yes  Last refill:  01/13/20  Future visit scheduled: Yes  Notes to clinic:  See request.    Requested Prescriptions  Pending Prescriptions Disp Refills   Vitamin D, Ergocalciferol, (DRISDOL) 1.25 MG (50000 UNIT) CAPS capsule 12 capsule 1    Sig: Take 1 capsule (50,000 Units total) by mouth once a week.      Endocrinology:  Vitamins - Vitamin D Supplementation Failed - 07/06/2020  2:44 PM      Failed - 50,000 IU strengths are not delegated      Failed - Phosphate in normal range and within 360 days    No results found for: PHOS        Failed - Vitamin D in normal range and within 360 days    Vit D, 25-Hydroxy  Date Value Ref Range Status  05/22/2018 48 30 - 100 ng/mL Final    Comment:    Vitamin D Status         25-OH Vitamin D: . Deficiency:                    <20 ng/mL Insufficiency:             20 - 29 ng/mL Optimal:                 > or = 30 ng/mL . For 25-OH Vitamin D testing on patients on  D2-supplementation and patients for whom quantitation  of D2 and D3 fractions is required, the QuestAssureD(TM) 25-OH VIT D, (D2,D3), LC/MS/MS is recommended: order  code [id number] (patients >39yrs). . For more information on this test, go to: http://education.questdiagnostics.com/faq/FAQ163 (This link is being provided for  informational/educational purposes only.)           Passed - Ca in normal range and within 360 days    Calcium  Date Value Ref Range Status  06/08/2020 9.1 8.6 - 10.4 mg/dL Final   Calcium, Total  Date Value Ref Range Status  11/22/2014 8.9 8.5 - 10.1 mg/dL Final          Passed - Valid encounter within last 12 months    Recent Outpatient Visits           4 weeks ago Pruritus   [facility] Steele Sizer, MD   4 months ago History of pulmonary embolism   [facility] Steele Sizer, MD   5  months ago Acute pulmonary embolism without acute cor pulmonale, unspecified pulmonary embolism type [facility])   [facility] Towanda Malkin, MD   8 months ago Acquired hypothyroidism   [facility] Steele Sizer, MD   1 year ago Benign hypertension with chronic kidney disease, stage III [facility])   [facility] Steele Sizer, MD       Future Appointments             In 2 weeks  [facility] [place]   In 1 month Steele Sizer, MD [place]

## 2020-07-06 NOTE — Telephone Encounter (Signed)
Pt's daughter called in to request a refill for pt's Vitamin D, Ergocalciferol, (DRISDOL) 1.25 MG (50000 UNIT) CAPS capsule    Pharmacy:  [place], [place] Phone:  [phone]  Fax:  [phone]

## 2020-07-07 ENCOUNTER — Telehealth: Payer: Self-pay | Admitting: Nurse Practitioner

## 2020-07-07 MED ORDER — VITAMIN D (ERGOCALCIFEROL) 1.25 MG (50000 UNIT) PO CAPS: 50000.0000 [IU] | ORAL_CAPSULE | ORAL | 1 refills | Status: DC

## 2020-07-07 NOTE — Telephone Encounter (Signed)
Spoke with patient and let her know that we rec'd a referral from Dr. Ancil Boozer for Palliative services and patient stated that she didn't know anything about this.  She gave me permission to speak with her daughters about this:  [name] or Kirstie Mirza.  I called daughter, [name], with no answer - left message with reason for call along with my contact information - requesting a return call.  I also called daughter, Mable, no answer - left message as well with my name and contact number.

## 2020-07-21 ENCOUNTER — Ambulatory Visit (INDEPENDENT_AMBULATORY_CARE_PROVIDER_SITE_OTHER): Payer: Medicare PPO

## 2020-07-21 DIAGNOSIS — Z0000 Encounter for general adult medical examination without abnormal findings: Secondary | ICD-10-CM

## 2020-07-21 NOTE — Progress Notes (Signed)
Subjective:   Lori Pacheco is a 86 y.o. female who presents for Medicare Annual (Subsequent) preventive examination.  Virtual Visit via Telephone Note  I connected with  Lori Pacheco on 07/21/20 at  3:30 PM EDT by telephone and verified that I am speaking with the correct person using two identifiers.  Medicare Annual Wellness visit completed telephonically due to Covid-19 pandemic.   Location: Patient: home Provider: [place]   I discussed the limitations, risks, security and privacy concerns of performing an evaluation and management service by telephone and the availability of in person appointments. The patient expressed understanding and agreed to proceed.  Unable to perform video visit due to video visit attempted and failed and/or patient does not have video capability.   Some vital signs may be absent or patient reported.   Clemetine Marker, LPN    Review of Systems     Cardiac Risk Factors include: advanced age (>18men, >89 women);hypertension     Objective:    There were no vitals filed for this visit. There is no height or weight on file to calculate BMI.  Advanced Directives 07/21/2020 04/17/2020 05/18/2019 12/25/2018 12/22/2017 07/21/2017 06/26/2017  Does Patient Have a Medical Advance Directive? No No No No No No No  Type of Advance Directive - - - - - - -  Does patient want to make changes to medical advance directive? - - - - - - -  Copy of [place] in Chart? - - - - - - -  Would patient like information on creating a medical advance directive? Yes (MAU/Ambulatory/Procedural Areas - Information given) - No - Patient declined No - Patient declined Yes (MAU/Ambulatory/Procedural Areas - Information given) Yes (MAU/Ambulatory/Procedural Areas - Information given) -    Current Medications (verified) Outpatient Encounter Medications as of 07/21/2020  Medication Sig  . acetaminophen (TYLENOL) 500 MG tablet Take 1 tablet (500 mg total) by mouth every 6  (six) hours as needed.  Marland Kitchen amLODipine (NORVASC) 10 MG tablet Take 1 tablet (10 mg total) by mouth daily.  . Blood Glucose-BP Monitor (BLOOD GLUCOSE-WRIST BP MONITOR) DEVI 1 each by Does not apply route daily.  . hydrOXYzine (ATARAX/VISTARIL) 10 MG tablet Take 1 tablet (10 mg total) by mouth at bedtime.  Marland Kitchen levocetirizine (XYZAL) 5 MG tablet Take 1 tablet (5 mg total) by mouth every evening.  Marland Kitchen levothyroxine (SYNTHROID) 112 MCG tablet One daily and skip sundays (Patient taking differently: Take 112 mcg by mouth See admin instructions. Take 1 tablet (138mcg) by mouth every morning except Sunday)  . lidocaine (LIDODERM) 5 % Place 2 patches onto the skin every morning. And remove it at night (Patient taking differently: Place 1-2 patches onto the skin daily as needed (pain). (remove after 12 hours))  . lisinopril (ZESTRIL) 20 MG tablet Take 1 tablet (20 mg total) by mouth daily.  Marland Kitchen lubiprostone (AMITIZA) 24 MCG capsule Take 1 capsule by mouth 2 (two) times daily.  . sertraline (ZOLOFT) 50 MG tablet Take 1 tablet (50 mg total) by mouth daily.  . Vitamin D, Ergocalciferol, (DRISDOL) 1.25 MG (50000 UNIT) CAPS capsule Take 1 capsule (50,000 Units total) by mouth once a week.  Marland Kitchen acetaminophen-codeine (TYLENOL #3) 300-30 MG tablet Take 1 tablet by mouth every 4 (four) hours as needed for moderate pain. (Patient not taking: Reported on 07/21/2020)  . apixaban (ELIQUIS) 2.5 MG TABS tablet Take 1 tablet (2.5 mg total) by mouth 2 (two) times daily. (Patient taking differently: Take 2.5 mg  by mouth daily. )   No facility-administered encounter medications on file as of 07/21/2020.    Allergies (verified) Lyrica [pregabalin]   History: Past Medical History:  Diagnosis Date  . Hyperlipidemia   . Hypertension   . Thyroid disease    Past Surgical History:  Procedure Laterality Date  . NO PAST SURGERIES     Family History  Problem Relation Age of Onset  . Healthy Mother   . Healthy Father   . Heart  disease Brother   . Cancer Brother   . Cancer Daughter   . Alcohol abuse Son   . Diabetes Son   . Heart disease Son    Social History   Socioeconomic History  . Marital status: Widowed    Spouse name: Jeneen Rinks  . Number of children: 47  . Years of education: Not on file  . Highest education level: 3rd grade  Occupational History  . Occupation: Retired  Tobacco Use  . Smoking status: Never Smoker  . Smokeless tobacco: Former Systems developer    Types: Snuff  . Tobacco comment: smoking cessation materials not required  Vaping Use  . Vaping Use: Never used  Substance and Sexual Activity  . Alcohol use: No    Alcohol/week: 0.0 standard drinks  . Drug use: No  . Sexual activity: Not Currently  Other Topics Concern  . Not on file  Social History Narrative    Pt's daughter lives with her and another daughter lives next door   Social Determinants of Health   Financial Resource Strain: Low Risk   . Difficulty of Paying Living Expenses: Not hard at all  Food Insecurity: No Food Insecurity  . Worried About Charity fundraiser in the Last Year: Never true  . Ran Out of Food in the Last Year: Never true  Transportation Needs: No Transportation Needs  . Lack of Transportation (Medical): No  . Lack of Transportation (Non-Medical): No  Physical Activity: Sufficiently Active  . Days of Exercise per Week: 7 days  . Minutes of Exercise per Session: 60 min  Stress: No Stress Concern Present  . Feeling of Stress : Only a little  Social Connections: Moderately Isolated  . Frequency of Communication with Friends and Family: More than three times a week  . Frequency of Social Gatherings with Friends and Family: More than three times a week  . Attends Religious Services: More than 4 times per year  . Active Member of Clubs or Organizations: No  . Attends Archivist Meetings: Never  . Marital Status: Widowed    Tobacco Counseling Counseling given: Not Answered Comment: smoking cessation  materials not required   Clinical Intake:  Pre-visit preparation completed: Yes  Pain : No/denies pain     Nutritional Risks: None Diabetes: No  How often do you need to have someone help you when you read instructions, pamphlets, or other written materials from your doctor or pharmacy?: 1 - Never    Interpreter Needed?: No  Information entered by :: Clemetine Marker LPN   Activities of Daily Living In your present state of health, do you have any difficulty performing the following activities: 07/21/2020 03/02/2020  Hearing? N N  Comment declines hearing aids -  Vision? N N  Difficulty concentrating or making decisions? Y N  Walking or climbing stairs? Y N  Dressing or bathing? N N  Doing errands, shopping? Y N  Preparing Food and eating ? N -  Using the Toilet? N -  In the  past six months, have you accidently leaked urine? N -  Do you have problems with loss of bowel control? N -  Managing your Medications? Y -  Managing your Finances? Y -  Housekeeping or managing your Housekeeping? N -  Some recent data might be hidden    Patient Care Team: Steele Sizer, MD as PCP - General (Family Medicine) Anthonette Legato, MD as Consulting Physician (Nephrology)  Indicate any recent Medical Services you may have received from other than [facility] providers in the past year (date may be approximate).     Assessment:   This is a routine wellness examination for [place].  Hearing/Vision screen  Hearing Screening   125Hz  250Hz  500Hz  1000Hz  2000Hz  3000Hz  4000Hz  6000Hz  8000Hz   Right ear:           Left ear:           Comments: Pt denies hearing difficulty  Vision Screening Comments: Past due for eye exam  Dietary issues and exercise activities discussed: Current Exercise Habits: Home exercise routine, Type of exercise: walking, Time (Minutes): 60, Frequency (Times/Week): 7, Weekly Exercise (Minutes/Week): 420, Intensity: Mild, Exercise limited by: cardiac condition(s);orthopedic  condition(s)  Goals    . Patient requires assistance with medicaiton management and adherence      Clinical Goals:   Interventions:      . Patient Stated     Maintain healthy blood pressure       Depression Screen PHQ 2/9 Scores 07/21/2020 06/08/2020 03/02/2020 02/07/2020 11/01/2019 07/01/2019 05/28/2019  PHQ - 2 Score 2 2 1 1 3 4  0  PHQ- 9 Score 4 8 7 8 15 14 7     Fall Risk Fall Risk  07/21/2020 06/08/2020 02/07/2020 11/01/2019 07/01/2019  Falls in the past year? 1 1 1 1  0  Comment - - - - -  Number falls in past yr: 1 1 0 0 0  Injury with Fall? 0 0 0 0 0  Risk Factor Category  - - - - -  Comment - - - - -  Risk for fall due to : History of fall(s);Impaired balance/gait - - History of fall(s);Impaired balance/gait -  Risk for fall due to: Comment - - - - -  Follow up Falls prevention discussed - - - -    Any stairs in or around the home? Yes  If so, are there any without handrails? No  Home free of loose throw rugs in walkways, pet beds, electrical cords, etc? Yes  Adequate lighting in your home to reduce risk of falls? Yes   ASSISTIVE DEVICES UTILIZED TO PREVENT FALLS:  Life alert? No  Use of a cane, walker or w/c? Yes  - walking stick Grab bars in the bathroom? No  Shower chair or bench in shower? No  Elevated toilet seat or a handicapped toilet? No   TIMED UP AND GO:  Was the test performed? No . Telephonic visit.   Cognitive Function: Pt declined 6CIT     6CIT Screen 12/25/2018 12/22/2017 12/19/2016  What Year? 4 points 4 points 4 points  What month? 0 points 0 points 0 points  What time? 0 points 0 points 3 points  Count back from 20 4 points 4 points 4 points  Months in reverse 4 points 4 points 4 points  Repeat phrase 10 points 10 points 8 points  Total Score 22 22 23     Immunizations Immunization History  Administered Date(s) Administered  . Fluad Quad(high Dose 65+) 07/01/2019  . Influenza, High  Dose Seasonal PF 09/17/2015, 08/22/2016, 06/26/2017,  07/23/2018  . Influenza-Unspecified 09/02/2014  . PFIZER SARS-COV-2 Vaccination 12/10/2019, 01/07/2020  . Pneumococcal Conjugate-13 11/14/2013, 09/27/2016  . Pneumococcal Polysaccharide-23 11/29/2011  . Tdap 07/06/2012  . Zoster 05/29/2008    TDAP status: Up to date   Flu vaccine status: due for 2021-2022 season  Pneumococcal vaccine status: Up to date   Covid-19 vaccine status: Completed vaccines  Qualifies for Shingles Vaccine? Yes   Zostavax completed Yes   Shingrix Completed?: No.    Education has been provided regarding the importance of this vaccine. Patient has been advised to call insurance company to determine out of pocket expense if they have not yet received this vaccine. Advised may also receive vaccine at local pharmacy or Health Dept. Verbalized acceptance and understanding.  Screening Tests Health Maintenance  Topic Date Due  . INFLUENZA VACCINE  05/17/2020  . TETANUS/TDAP  07/06/2022  . DEXA SCAN  Completed  . COVID-19 Vaccine  Completed  . PNA vac Low Risk Adult  Completed    Health Maintenance  Health Maintenance Due  Topic Date Due  . INFLUENZA VACCINE  05/17/2020    Colorectal cancer screening: No longer required.    Mammogram status: No longer required.    Bone density screening status: no longer required  Lung Cancer Screening: (Low Dose CT Chest recommended if Age 19-80 years, 30 pack-year currently smoking OR have quit w/in 15years.) does not qualify.   Additional Screening:  Hepatitis C Screening: does not qualify;   Vision Screening: Recommended annual ophthalmology exams for early detection of glaucoma and other disorders of the eye. Is the patient up to date with their annual eye exam?  No  Who is the provider or what is the name of the office in which the patient attends annual eye exams? Not established If pt is not established with a provider, would they like to be referred to a provider to establish care? No .   Dental Screening:  Recommended annual dental exams for proper oral hygiene  Community Resource Referral / Chronic Care Management: CRR required this visit?  No   CCM required this visit?  No      Plan:     I have personally reviewed and noted the following in the patient's chart:   . Medical and social history . Use of alcohol, tobacco or illicit drugs  . Current medications and supplements . Functional ability and status . Nutritional status . Physical activity . Advanced directives . List of other physicians . Hospitalizations, surgeries, and ER visits in previous 12 months . Vitals . Screenings to include cognitive, depression, and falls . Referrals and appointments  In addition, I have reviewed and discussed with patient certain preventive protocols, quality metrics, and best practice recommendations. A written personalized care plan for preventive services as well as general preventive health recommendations were provided to patient.     Clemetine Marker, LPN   [id number]   Nurse Notes: pt accompanied during visit by daughter [name].

## 2020-07-21 NOTE — Patient Instructions (Signed)
Lori Pacheco , Thank you for taking time to come for your Medicare Wellness Visit. I appreciate your ongoing commitment to your health goals. Please review the following plan we discussed and let me know if I can assist you in the future.   Screening recommendations/referrals: Colonoscopy: no longer required Mammogram: no longer required Bone Density: no longer required Recommended yearly ophthalmology/optometry visit for glaucoma screening and checkup Recommended yearly dental visit for hygiene and checkup  Vaccinations: Influenza vaccine: due Pneumococcal vaccine: done 09/27/16 Tdap vaccine: done 07/06/12 Shingles vaccine: Shingrix discussed. Please contact your pharmacy for coverage information.  Covid-19: done 12/10/19 & 01/07/20  Advanced directives: Advance directive discussed with you today. I have provided a copy for you to complete at home and have notarized. Once this is complete please bring a copy in to our office so we can scan it into your chart.  Conditions/risks identified: Recommend fall prevention in the home  Next appointment: Follow up in one year for your annual wellness visit    Preventive Care 65 Years and Older, Female Preventive care refers to lifestyle choices and visits with your health care provider that can promote health and wellness. What does preventive care include?  A yearly physical exam. This is also called an annual well check.  Dental exams once or twice a year.  Routine eye exams. Ask your health care provider how often you should have your eyes checked.  Personal lifestyle choices, including:  Daily care of your teeth and gums.  Regular physical activity.  Eating a healthy diet.  Avoiding tobacco and drug use.  Limiting alcohol use.  Practicing safe sex.  Taking low-dose aspirin every day.  Taking vitamin and mineral supplements as recommended by your health care provider. What happens during an annual well check? The services and  screenings done by your health care provider during your annual well check will depend on your age, overall health, lifestyle risk factors, and family history of disease. Counseling  Your health care provider may ask you questions about your:  Alcohol use.  Tobacco use.  Drug use.  Emotional well-being.  Home and relationship well-being.  Sexual activity.  Eating habits.  History of falls.  Memory and ability to understand (cognition).  Work and work Statistician.  Reproductive health. Screening  You may have the following tests or measurements:  Height, weight, and BMI.  Blood pressure.  Lipid and cholesterol levels. These may be checked every 5 years, or more frequently if you are over 39 years old.  Skin check.  Lung cancer screening. You may have this screening every year starting at age 42 if you have a 30-pack-year history of smoking and currently smoke or have quit within the past 15 years.  Fecal occult blood test (FOBT) of the stool. You may have this test every year starting at age 16.  Flexible sigmoidoscopy or colonoscopy. You may have a sigmoidoscopy every 5 years or a colonoscopy every 10 years starting at age 39.  Hepatitis C blood test.  Hepatitis B blood test.  Sexually transmitted disease (STD) testing.  Diabetes screening. This is done by checking your blood sugar (glucose) after you have not eaten for a while (fasting). You may have this done every 1-3 years.  Bone density scan. This is done to screen for osteoporosis. You may have this done starting at age 18.  Mammogram. This may be done every 1-2 years. Talk to your health care provider about how often you should have regular mammograms. Talk  with your health care provider about your test results, treatment options, and if necessary, the need for more tests. Vaccines  Your health care provider may recommend certain vaccines, such as:  Influenza vaccine. This is recommended every  year.  Tetanus, diphtheria, and acellular pertussis (Tdap, Td) vaccine. You may need a Td booster every 10 years.  Zoster vaccine. You may need this after age 23.  Pneumococcal 13-valent conjugate (PCV13) vaccine. One dose is recommended after age 79.  Pneumococcal polysaccharide (PPSV23) vaccine. One dose is recommended after age 61. Talk to your health care provider about which screenings and vaccines you need and how often you need them. This information is not intended to replace advice given to you by your health care provider. Make sure you discuss any questions you have with your health care provider. Document Released: 10/30/2015 Document Revised: 06/22/2016 Document Reviewed: 08/04/2015 Elsevier Interactive Patient Education  2017 [place] Prevention in the Home Falls can cause injuries. They can happen to people of all ages. There are many things you can do to make your home safe and to help prevent falls. What can I do on the outside of my home?  Regularly fix the edges of walkways and driveways and fix any cracks.  Remove anything that might make you trip as you walk through a door, such as a raised step or threshold.  Trim any bushes or trees on the path to your home.  Use bright outdoor lighting.  Clear any walking paths of anything that might make someone trip, such as rocks or tools.  Regularly check to see if handrails are loose or broken. Make sure that both sides of any steps have handrails.  Any raised decks and porches should have guardrails on the edges.  Have any leaves, snow, or ice cleared regularly.  Use sand or salt on walking paths during winter.  Clean up any spills in your garage right away. This includes oil or grease spills. What can I do in the bathroom?  Use night lights.  Install grab bars by the toilet and in the tub and shower. Do not use towel bars as grab bars.  Use non-skid mats or decals in the tub or shower.  If you  need to sit down in the shower, use a plastic, non-slip stool.  Keep the floor dry. Clean up any water that spills on the floor as soon as it happens.  Remove soap buildup in the tub or shower regularly.  Attach bath mats securely with double-sided non-slip rug tape.  Do not have throw rugs and other things on the floor that can make you trip. What can I do in the bedroom?  Use night lights.  Make sure that you have a light by your bed that is easy to reach.  Do not use any sheets or blankets that are too big for your bed. They should not hang down onto the floor.  Have a firm chair that has side arms. You can use this for support while you get dressed.  Do not have throw rugs and other things on the floor that can make you trip. What can I do in the kitchen?  Clean up any spills right away.  Avoid walking on wet floors.  Keep items that you use a lot in easy-to-reach places.  If you need to reach something above you, use a strong step stool that has a grab bar.  Keep electrical cords out of the way.  Do  not use floor polish or wax that makes floors slippery. If you must use wax, use non-skid floor wax.  Do not have throw rugs and other things on the floor that can make you trip. What can I do with my stairs?  Do not leave any items on the stairs.  Make sure that there are handrails on both sides of the stairs and use them. Fix handrails that are broken or loose. Make sure that handrails are as long as the stairways.  Check any carpeting to make sure that it is firmly attached to the stairs. Fix any carpet that is loose or worn.  Avoid having throw rugs at the top or bottom of the stairs. If you do have throw rugs, attach them to the floor with carpet tape.  Make sure that you have a light switch at the top of the stairs and the bottom of the stairs. If you do not have them, ask someone to add them for you. What else can I do to help prevent falls?  Wear shoes  that:  Do not have high heels.  Have rubber bottoms.  Are comfortable and fit you well.  Are closed at the toe. Do not wear sandals.  If you use a stepladder:  Make sure that it is fully opened. Do not climb a closed stepladder.  Make sure that both sides of the stepladder are locked into place.  Ask someone to hold it for you, if possible.  Clearly mark and make sure that you can see:  Any grab bars or handrails.  First and last steps.  Where the edge of each step is.  Use tools that help you move around (mobility aids) if they are needed. These include:  Canes.  Walkers.  Scooters.  Crutches.  Turn on the lights when you go into a dark area. Replace any light bulbs as soon as they burn out.  Set up your furniture so you have a clear path. Avoid moving your furniture around.  If any of your floors are uneven, fix them.  If there are any pets around you, be aware of where they are.  Review your medicines with your doctor. Some medicines can make you feel dizzy. This can increase your chance of falling. Ask your doctor what other things that you can do to help prevent falls. This information is not intended to replace advice given to you by your health care provider. Make sure you discuss any questions you have with your health care provider. Document Released: 07/30/2009 Document Revised: 03/10/2016 Document Reviewed: 11/07/2014 Elsevier Interactive Patient Education  2017 [employer].

## 2020-07-29 ENCOUNTER — Telehealth: Payer: Self-pay | Admitting: Nurse Practitioner

## 2020-07-29 NOTE — Telephone Encounter (Signed)
Called patient's daughter's cell, [name], to discuss Palliative referral/services with her, and ended up speaking with her daughter.  She stated that [name] was working and she took my name and number and will have her call me back. m

## 2020-07-30 ENCOUNTER — Telehealth: Payer: Self-pay | Admitting: Nurse Practitioner

## 2020-07-30 NOTE — Telephone Encounter (Signed)
Rec'd call back from patient's daughter, [name], and have scheduled an In-person Consult for 08/03/20 @ 2 PM.

## 2020-07-30 NOTE — Telephone Encounter (Signed)
Rec'd a return call from patient's daughter, [name], and we discussed what Palliative services were and she wanted to know if she could call me back later this afternoon due to she was at work and I requested that she call me before 4 PM and she agreed to do so.

## 2020-08-03 ENCOUNTER — Other Ambulatory Visit: Payer: Medicare PPO | Admitting: Nurse Practitioner

## 2020-08-03 ENCOUNTER — Other Ambulatory Visit: Payer: Self-pay

## 2020-08-03 DIAGNOSIS — Z515 Encounter for palliative care: Secondary | ICD-10-CM | POA: Diagnosis not present

## 2020-08-03 DIAGNOSIS — G8929 Other chronic pain: Secondary | ICD-10-CM

## 2020-08-03 NOTE — Progress Notes (Signed)
[facility] Consult Note Telephone: [phone]  Fax: [phone]  PATIENT NAME: Lori Pacheco [address] [address] [phone] (home)  DOB: Jul 17, 1921 MRN: [id number]  PRIMARY CARE PROVIDER:    Steele Sizer, MD,  [address] [place] [address] [phone]  REFERRING PROVIDER:   Steele Sizer, [place],  [place] [address] [phone]  RESPONSIBLE PARTY:   [name] (daughter):  [phone] cell, [phone] Home Marcus Groll (daughter) [phone] Isa Rankin (granddaughter) [phone]  I met face to face with patient and daughter [name] in home.  ASSESSMENT AND RECOMMENDATIONS:   1. Advance Care Planning/Goals of Care:  Palliative Care was asked to follow this patient by consultation request of Steele Sizer, MD to help address advance care planning and goals of care. This is an initial visit. Today's visit consisted of building trust and discussions on Palliative care medicine as specialized medical care for people living with serious illness, aimed at facilitating improved quality of life through symptoms relief, assisting with advance care plan and establishing goals of care.  Patient and her daughter expressed appreciation for education provided on Palliative care and how it differs from Hospice care service. Daughter report having experience with Hospice careservices as her elder sister was in Hospice care for cancer before demise.    Goals of Care: Patient goal of care is comfort and function. Family desires for patient to maintain current function for as long as possible. Directives: Discussed and reviewed need for advanced care planning. Patient verbalized desire to not be resuscitated in the event of cardiac or respiratory arrest saying "I want to go meet Jesus, he is my father, he is so good to me". Daughter verbalized support for  patient wish. DNR form signed and given to daughter to keep in a readily accessible area in home, telling her most people put it on the fridge, copy uploaded to [facility]. Discussed the importance of MOST form, MOST form left with patient daughter to review, we will discuss at next meeting.   2. Symptom Management:  Patient with chronic back pain which is said to be likely related to plaque in her superior mesenteric artery causing stenosis. Patient also noted to have an incidental finding of a mass (3.3 x 1.1cm)  on her Pancreatic head in April 2021. Patient report intermittent pain in her lower back and hip area, worse on right, she report pain is aggravated by activity. She report adequate pain relief with current regimen of Tylenol 530m every 6hs as needed and Tylenol-codeine 300-385m(Tylenol #3) every 4hrs PRN, she denied pain during visit.  Recommend patient continue with current regimen with plan to adjust dose as needed to achieve adequate pain control. Patient encouraged to pace her activitie and avoid strenuous activities, encouraged patient to sit on a small chair vs bending while working in her yard. Daughter verbalized function as being improtant to her, she however does not desire for patient to have any surgery, saying patient was told by her previous PCP to not allow anyone operate on her. Patient reiterated desire for no surgery. Family does not want want to pursue further work-up or diagnostic regarding the mass on the pancrease. Daughter said " if surgery will not improve her life or save her, I dont want her to go thru it". Patient report occasional heart burn and nausea, denied vomiting, denied abdominal pain. Patient report symptom relief with PRN TUMs  for heart burn. Recommendation: If worsening heart burn, may have to start patient low dose PPI for trial.  Patient report Insomnia with difficulty falling asleep and staying asleep. Patient report going to bed at 9pm and waking  up several times during the night. Daughter report patient takes naps during the day, saying she believed that maybe causing her insomnia. Recommend patient take Melatonin 43m in evenings as needed for Insomnia. Daughter made aware that Melatonin is over the counter. Discussed and reviewed good sleep hygiene practices; not drink caffienated drink close to bed time, turning lights off at bed time and going to bed same time daily. Palliative care will continue to provide support to patient, family and the medical team.    3. Follow up Palliative Care Visit: Palliative care will continue to follow for goals of care clarification and symptom management. Return in about 4 weeks or prn.  4. Family /Caregiver/Community Supports: Patient lives in her house with her youngest daughter. She had 9 children, 4 of whom are living. 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  5. Cognitive / Functional decline: Patient awake and alert, cooperative and coherent. She is oriented to self, place and situation, disoriented to time. She is independent with her ADLs, walks without assistive device. Family report she has a walker, refused to use it. Family report she occasionally will pick a stick in her yard to walk with with. Patient able to prepare light meals, and rake her yard. Report falls, trips on tree stumps, last fall 3 weeks ago- no injury reported.  I spent 60 minutes providing this consultation, time includes time spent with patient/family, chart review, and documentation  More than 50% of the time in this consultation was spent coordinating communication.   HISTORY OF PRESENT ILLNESS:  Lori ALAMEDAis a 86y.o. year old female with multiple medical problems including Chronic back pain, Depression, HLD, HTN, Hypothyroidism, Hx of PE on Eliquis.   CODE STATUS: DNR  PPS: 70%  HOSPICE ELIGIBILITY/DIAGNOSIS: TBD  PAST MEDICAL  HISTORY:  Past Medical History:  Diagnosis Date   Hyperlipidemia    Hypertension    Thyroid disease     SOCIAL HX:  Social History   Tobacco Use   Smoking status: Never Smoker   Smokeless tobacco: Former [employer] developer   Types: Snuff   Tobacco comment: smoking cessation materials not required  Substance Use Topics   Alcohol use: No    Alcohol/week: 0.0 standard drinks   FAMILY HX:  Family History  Problem Relation Age of Onset   Healthy Mother    Healthy Father    Heart disease Brother    Cancer Brother    Cancer Daughter    Alcohol abuse Son    Diabetes Son    Heart disease Son     ALLERGIES:  Allergies  Allergen Reactions   Lyrica [Pregabalin]     " I felt groggy"     PERTINENT MEDICATIONS:  Outpatient Encounter Medications as of 08/03/2020  Medication Sig   acetaminophen (TYLENOL) 500 MG tablet Take 1 tablet (500 mg total) by mouth every 6 (six) hours as needed.   acetaminophen-codeine (TYLENOL #3) 300-30 MG tablet Take 1 tablet by mouth every 4 (four) hours as needed for moderate pain. (Patient not taking: Reported on 07/21/2020)   amLODipine (NORVASC) 10 MG tablet Take 1 tablet (10 mg total) by mouth daily.   apixaban (ELIQUIS) 2.5 MG  TABS tablet Take 1 tablet (2.5 mg total) by mouth 2 (two) times daily. (Patient taking differently: Take 2.5 mg by mouth daily. )   Blood Glucose-BP Monitor (BLOOD GLUCOSE-WRIST BP MONITOR) DEVI 1 each by Does not apply route daily.   hydrOXYzine (ATARAX/VISTARIL) 10 MG tablet Take 1 tablet (10 mg total) by mouth at bedtime.   levocetirizine (XYZAL) 5 MG tablet Take 1 tablet (5 mg total) by mouth every evening.   levothyroxine (SYNTHROID) 112 MCG tablet One daily and skip sundays (Patient taking differently: Take 112 mcg by mouth See admin instructions. Take 1 tablet (115mg) by mouth every morning except Sunday)   lidocaine (LIDODERM) 5 % Place 2 patches onto the skin every morning. And remove it at night (Patient  taking differently: Place 1-2 patches onto the skin daily as needed (pain). (remove after 12 hours))   lisinopril (ZESTRIL) 20 MG tablet Take 1 tablet (20 mg total) by mouth daily.   lubiprostone (AMITIZA) 24 MCG capsule Take 1 capsule by mouth 2 (two) times daily.   sertraline (ZOLOFT) 50 MG tablet Take 1 tablet (50 mg total) by mouth daily.   Vitamin D, Ergocalciferol, (DRISDOL) 1.25 MG (50000 UNIT) CAPS capsule Take 1 capsule (50,000 Units total) by mouth once a week.   No facility-administered encounter medications on file as of 08/03/2020.    PHYSICAL EXAM / ROS:   Current and past weights: Ht 581f", Weight 127lbs, BMI 23.2kg/m2 General:  frail appearing, sitting in chair in no acute distress Cardiovascular: denied chest pain, no edema, S1S2 normal Pulmonary: no cough, no SOB, room air, lungs clear Abdomen: appetite fair, denied constipation, continent of bowel GU: denies dysuria, incontinent of urine- wears incontinent underwear MSK:  no joint and ROM abnormalities, ambulatory Skin: no rashes or wounds reported or noted on exposed skin Neurological: weakness, non focal  QuJari FavreDNP, AGPCNP-BC

## 2020-08-12 ENCOUNTER — Other Ambulatory Visit: Payer: Self-pay

## 2020-08-12 ENCOUNTER — Ambulatory Visit: Payer: Medicare PPO | Admitting: Family Medicine

## 2020-08-12 ENCOUNTER — Encounter: Payer: Self-pay | Admitting: Family Medicine

## 2020-08-12 VITALS — BP 140/72 | HR 75 | Temp 98.4°F | Resp 16 | Ht 62.0 in | Wt 118.0 lb

## 2020-08-12 DIAGNOSIS — K551 Chronic vascular disorders of intestine: Secondary | ICD-10-CM

## 2020-08-12 DIAGNOSIS — M545 Low back pain, unspecified: Secondary | ICD-10-CM

## 2020-08-12 DIAGNOSIS — Z23 Encounter for immunization: Secondary | ICD-10-CM | POA: Diagnosis not present

## 2020-08-12 DIAGNOSIS — F320 Major depressive disorder, single episode, mild: Secondary | ICD-10-CM | POA: Diagnosis not present

## 2020-08-12 DIAGNOSIS — I82409 Acute embolism and thrombosis of unspecified deep veins of unspecified lower extremity: Secondary | ICD-10-CM | POA: Diagnosis not present

## 2020-08-12 DIAGNOSIS — E039 Hypothyroidism, unspecified: Secondary | ICD-10-CM

## 2020-08-12 DIAGNOSIS — N1831 Chronic kidney disease, stage 3a: Secondary | ICD-10-CM

## 2020-08-12 DIAGNOSIS — I129 Hypertensive chronic kidney disease with stage 1 through stage 4 chronic kidney disease, or unspecified chronic kidney disease: Secondary | ICD-10-CM | POA: Diagnosis not present

## 2020-08-12 DIAGNOSIS — K8689 Other specified diseases of pancreas: Secondary | ICD-10-CM

## 2020-08-12 DIAGNOSIS — F419 Anxiety disorder, unspecified: Secondary | ICD-10-CM | POA: Diagnosis not present

## 2020-08-12 DIAGNOSIS — G8929 Other chronic pain: Secondary | ICD-10-CM

## 2020-08-12 DIAGNOSIS — I10 Essential (primary) hypertension: Secondary | ICD-10-CM

## 2020-08-12 DIAGNOSIS — N183 Chronic kidney disease, stage 3 unspecified: Secondary | ICD-10-CM

## 2020-08-12 LAB — TSH: TSH: 0.39 mIU/L — ABNORMAL LOW (ref 0.40–4.50)

## 2020-08-12 MED ORDER — LISINOPRIL 20 MG PO TABS: 20.0000 mg | ORAL_TABLET | Freq: Every day | ORAL | 1 refills | Status: DC

## 2020-08-12 MED ORDER — SERTRALINE HCL 50 MG PO TABS: 50.0000 mg | ORAL_TABLET | Freq: Every day | ORAL | 1 refills | Status: DC

## 2020-08-12 MED ORDER — AMLODIPINE BESYLATE 10 MG PO TABS: 10.0000 mg | ORAL_TABLET | Freq: Every day | ORAL | 1 refills | Status: DC

## 2020-08-12 NOTE — Progress Notes (Signed)
Name: Lori Pacheco   MRN: [id number]    DOB: November 24, 1920   Date:08/12/2020       Progress Note  Subjective  Chief Complaint  Follow up   HPI   Patient came in today with her daughter ( caregiver) , Ms. Ewings and daughter are aware of her advanced age and multiple medical problems that are not managed such as CKI and also MGUS and increase in cognitive dysfunction. She is now having palliative care by [facility] visiting her once a month She is DNR  Since her last visit with me she has not been back to the [facility]. Discussed with daughter that it is important to give her physical touch, fun activities and crafts - she loves having her great-grandchild - 84 yo [place] , discussed doing crafts with him .   She used to have a lot of pruritis but doing well lately. Explained it may be from [facility] that is not being treated. Advised to continue to use topical lotion and if needed may add hydrocortisone when severe however it will likely not improve much, she also has hydroxyzine for anxiety that may help with itching   CKI with secondary hyperparathyroidism: not willing to see nephrologist at this time, reviewed labs with patient   CT abdomen done [facility] in 2015   1. No evidence of bowel ischemia. 2. Prominent calcified atheromatous plaque at the origin of the celiac artery and SMA resulting in mild-moderate stenosis, as above, but the distal branches remain patent. 3. Small amount of free fluid in the pelvis of unknown etiology.   Repeat CT abdomen at [facility] 01/2020   1. Right lower lobe pulmonary embolism. This was discussed with the ordering ER physician. 2. 3.3 x 1.1 cm hypoattenuating lesion within the pancreatic head, favored to represent a sidebranch IPMN. In the setting of advanced age, no definite follow-up is necessary, however, this may be further evaluated with MRI/MRCP as clinically desired. 3. Small-volume abdominal pelvic ascites with diffuse mesenteric edema. Findings are nonspecific without  identifiable cause by CT. 4. No evidence of bowel obstruction as clinically questioned.  DVT left leg: diagnosed during hospital stay, for some reason she is down to 2.5 mg once a day, explained treatment needs to be for at least 6 months at 2.5 mg BID, but daughter is still giving her just one tablet day. She denies calf pain or SOB . Unchanged   MDD: daughter states she needs reassurance at times, seems nervous at times, she seemed sad when she came in, but she relaxed and smiled before she left . Daughter is not sure she has been taking zoloft talked to the patient and she wants to take " happy pills"  Atherosclerosis of aorta: on eliquis, does not want any other medication such as statin therapy, advanced medical problems , unchanged   Pancreatic mass: found on CT abdomen, daughter or patient are aware of the findings of pancreatic mass on CT done at [facility] showed a mass, since she has a constant epigastric pain and back pain and because of advanced age, would not tolerate therapy, we will try treating her with some pain medication, she denies nausea, appetite is poor, but weight is stable since last visit . She has early satiety   CT result :  PANCREAS: Lobulated hypoattenuating lesion within the pancreatic head measuring 3.3 x 1.1 cm (4:26). Top differential consideration would include a sidebranch IPMN.   Patient Active Problem List   Diagnosis Date Noted  . Superior mesenteric  artery stenosis ([place]) 03/02/2020  . Stage 3b chronic kidney disease ([place]) 03/02/2020  . History of pulmonary embolism 02/07/2020  . Chronic abdominal pain 02/07/2020  . Depression with anxiety 02/07/2020  . Cognitive dysfunction 12/25/2018  . Varicose veins of leg with pain, bilateral 12/04/2018  . Deep vein thrombosis (DVT) of femoral vein of left lower extremity ([place]) 10/17/2018  . Other constipation 04/23/2018  . Leg cramps 04/23/2018  . Secondary renal hyperparathyroidism ([place]) 05/15/2017  . Atherosclerosis  of abdominal aorta ([place]) 11/10/2016  . Perennial allergic rhinitis with seasonal variation 11/10/2016  . Vitamin D deficiency 03/29/2016  . Hypothyroidism 09/18/2015  . Essential hypertension 05/19/2015  . Hyperlipemia 05/19/2015    Past Surgical History:  Procedure Laterality Date  . NO PAST SURGERIES      Family History  Problem Relation Age of Onset  . Healthy Mother   . Healthy Father   . Heart disease Brother   . Cancer Brother   . Cancer Daughter   . Alcohol abuse Son   . Diabetes Son   . Heart disease Son     Social History   Tobacco Use  . Smoking status: Never Smoker  . Smokeless tobacco: Former Systems developer    Types: Snuff  . Tobacco comment: smoking cessation materials not required  Substance Use Topics  . Alcohol use: No    Alcohol/week: 0.0 standard drinks     Current Outpatient Medications:  .  acetaminophen (TYLENOL) 500 MG tablet, Take 1 tablet (500 mg total) by mouth every 6 (six) hours as needed., Disp: 100 tablet, Rfl: 0 .  amLODipine (NORVASC) 10 MG tablet, Take 1 tablet (10 mg total) by mouth daily., Disp: 90 tablet, Rfl: 1 .  hydrOXYzine (ATARAX/VISTARIL) 10 MG tablet, Take 1 tablet (10 mg total) by mouth at bedtime., Disp: 90 tablet, Rfl: 1 .  levocetirizine (XYZAL) 5 MG tablet, Take 1 tablet (5 mg total) by mouth every evening., Disp: 90 tablet, Rfl: 1 .  levothyroxine (SYNTHROID) 112 MCG tablet, One daily and skip sundays (Patient taking differently: Take 112 mcg by mouth See admin instructions. Take 1 tablet (184mcg) by mouth every morning except Sunday), Disp: 90 tablet, Rfl: 1 .  lidocaine (LIDODERM) 5 %, Place 2 patches onto the skin every morning. And remove it at night (Patient taking differently: Place 1-2 patches onto the skin daily as needed (pain). (remove after 12 hours)), Disp: 180 patch, Rfl: 0 .  lisinopril (ZESTRIL) 20 MG tablet, Take 1 tablet (20 mg total) by mouth daily., Disp: 90 tablet, Rfl: 1 .  acetaminophen-codeine (TYLENOL #3)  300-30 MG tablet, Take 1 tablet by mouth every 4 (four) hours as needed for moderate pain. (Patient not taking: Reported on 07/21/2020), Disp: 30 tablet, Rfl: 0 .  Blood Glucose-BP Monitor (BLOOD GLUCOSE-WRIST BP MONITOR) DEVI, 1 each by Does not apply route daily. (Patient not taking: Reported on 08/12/2020), Disp: 1 each, Rfl: 0 .  lubiprostone (AMITIZA) 24 MCG capsule, Take 1 capsule by mouth 2 (two) times daily. (Patient not taking: Reported on 08/12/2020), Disp: , Rfl:  .  sertraline (ZOLOFT) 50 MG tablet, Take 1 tablet (50 mg total) by mouth daily. Happy pill, Disp: 90 tablet, Rfl: 1 .  Vitamin D, Ergocalciferol, (DRISDOL) 1.25 MG (50000 UNIT) CAPS capsule, Take 1 capsule (50,000 Units total) by mouth once a week. (Patient not taking: Reported on 08/12/2020), Disp: 12 capsule, Rfl: 1  Allergies  Allergen Reactions  . Lyrica [Pregabalin]     " I felt groggy"  I personally reviewed active problem list, medication list, allergies, family history, social history, health maintenance with the patient/caregiver today.   ROS  Ten systems reviewed and is negative except as mentioned in HPI   Objective  Vitals:   08/12/20 1437  BP: 140/72  Pulse: 75  Resp: 16  Temp: 98.4 F (36.9 C)  TempSrc: Oral  SpO2: 100%  Weight: 118 lb (53.5 kg)  Height: 5\' 2"  (1.575 m)    Body mass index is 21.58 kg/m.  Physical Exam  Constitutional: Patient appears well-developed and frailNo distress.  HEENT: head atraumatic, normocephalic, pupils equal and reactive to light,  neck suppl Cardiovascular: Normal rate, regular rhythm and normal heart sounds.  No murmur heard. No BLE edema. Pulmonary/Chest: Effort normal and breath sounds normal. No respiratory distress. Abdominal: Soft.  There is no tenderness. Psychiatric: Patient has a normal mood and affect. behavior is normal. Judgment and thought content normal.  Recent Results (from the past 2160 hour(s))  CBC with Differential/Platelet      Status: Abnormal   Collection Time: 06/08/20  3:26 PM  Result Value Ref Range   WBC 2.5 (L) 3.8 - 10.8 Thousand/uL   RBC 3.65 (L) 3.80 - 5.10 Million/uL   Hemoglobin 10.5 (L) 11.7 - 15.5 g/dL   HCT 33.0 (L) 35 - 45 %   MCV 90.4 80.0 - 100.0 fL   MCH 28.8 27.0 - 33.0 pg   MCHC 31.8 (L) 32.0 - 36.0 g/dL   RDW 12.5 11.0 - 15.0 %   Platelets 180 140 - 400 Thousand/uL   MPV 10.5 7.5 - 12.5 fL   Neutro Abs 1,298 (L) 1,500 - 7,800 cells/uL   Lymphs Abs 908 850 - 3,900 cells/uL   Absolute Monocytes 245 200 - 950 cells/uL   Eosinophils Absolute 30 15.0 - 500.0 cells/uL   Basophils Absolute 20 0.0 - 200.0 cells/uL   Neutrophils Relative % 51.9 %   Total Lymphocyte 36.3 %   Monocytes Relative 9.8 %   Eosinophils Relative 1.2 %   Basophils Relative 0.8 %  COMPLETE METABOLIC PANEL WITH GFR     Status: Abnormal   Collection Time: 06/08/20  3:26 PM  Result Value Ref Range   Glucose, Bld 99 65 - 99 mg/dL    Comment: .            Fasting reference interval .    BUN 35 (H) 7 - 25 mg/dL   Creat 1.81 (H) 0.60 - 0.88 mg/dL    Comment: For patients >49 years of age, the reference limit for Creatinine is approximately 13% higher for people identified as African-American. .    GFR, Est Non African American 23 (L) > OR = 60 mL/min/1.90m2   GFR, Est African American 27 (L) > OR = 60 mL/min/1.37m2   BUN/Creatinine Ratio 19 6 - 22 (calc)   Sodium 141 135 - 146 mmol/L   Potassium 5.5 (H) 3.5 - 5.3 mmol/L   Chloride 108 98 - 110 mmol/L   CO2 27 20 - 32 mmol/L   Calcium 9.1 8.6 - 10.4 mg/dL   Total Protein 6.4 6.1 - 8.1 g/dL   Albumin 3.9 3.6 - 5.1 g/dL   Globulin 2.5 1.9 - 3.7 g/dL (calc)   AG Ratio 1.6 1.0 - 2.5 (calc)   Total Bilirubin 0.3 0.2 - 1.2 mg/dL   Alkaline phosphatase (APISO) 56 37 - 153 U/L   AST 16 10 - 35 U/L   ALT 8 6 - 29 U/L  TSH     Status: Abnormal   Collection Time: 06/08/20  3:26 PM  Result Value Ref Range   TSH 0.03 (L) 0.40 - 4.50 mIU/L     PHQ2/9: Depression  screen [facility] 2/9 08/12/2020 07/21/2020 06/08/2020 03/02/2020 02/07/2020  Decreased Interest 1 1 1 1  0  Down, Depressed, Hopeless 1 1 1  0 1  PHQ - 2 Score 2 2 2 1 1   Altered sleeping 0 0 1 1 2   Tired, decreased energy 1 1 1 1 2   Change in appetite 0 0 1 2 2   Feeling bad or failure about yourself  0 0 1 0 0  Trouble concentrating 1 1 1 1  0  Moving slowly or fidgety/restless 0 0 0 1 1  Suicidal thoughts 0 0 1 0 0  PHQ-9 Score 4 4 8 7 8   Difficult doing work/chores Not difficult at all Not difficult at all - Not difficult at all Somewhat difficult  Some recent data might be hidden    phq 9 is positive   Fall Risk: Fall Risk  08/12/2020 07/21/2020 06/08/2020 02/07/2020 11/01/2019  Falls in the past year? 1 1 1 1 1   Comment - - - - -  Number falls in past yr: 1 1 1  0 0  Comment 6 - - - -  Injury with Fall? 0 0 0 0 0  Risk Factor Category  - - - - -  Comment - - - - -  Risk for fall due to : History of fall(s);Impaired balance/gait History of fall(s);Impaired balance/gait - - History of fall(s);Impaired balance/gait  Risk for fall due to: Comment - - - - -  Follow up - Falls prevention discussed - - -     Functional Status Survey: Is the patient deaf or have difficulty hearing?: No Does the patient have difficulty seeing, even when wearing glasses/contacts?: No Does the patient have difficulty concentrating, remembering, or making decisions?: Yes Does the patient have difficulty walking or climbing stairs?: Yes Does the patient have difficulty dressing or bathing?: No Does the patient have difficulty doing errands alone such as visiting a doctor's office or shopping?: Yes   Assessment & Plan  1. Benign hypertension with chronic kidney disease, stage III (HCC)  - amLODipine (NORVASC) 10 MG tablet; Take 1 tablet (10 mg total) by mouth daily.  Dispense: 90 tablet; Refill: 1 - lisinopril (ZESTRIL) 20 MG tablet; Take 1 tablet (20 mg total) by mouth daily.  Dispense: 90 tablet; Refill:  1  2. Need for immunization against influenza  - Flu Vaccine QUAD High Dose(Fluad)  3. Essential hypertension  - amLODipine (NORVASC) 10 MG tablet; Take 1 tablet (10 mg total) by mouth daily.  Dispense: 90 tablet; Refill: 1 - lisinopril (ZESTRIL) 20 MG tablet; Take 1 tablet (20 mg total) by mouth daily.  Dispense: 90 tablet; Refill: 1  4. Stage 3a chronic kidney disease (HCC)  - lisinopril (ZESTRIL) 20 MG tablet; Take 1 tablet (20 mg total) by mouth daily.  Dispense: 90 tablet; Refill: 1  5. Anxiety  - sertraline (ZOLOFT) 50 MG tablet; Take 1 tablet (50 mg total) by mouth daily. Happy pill  Dispense: 90 tablet; Refill: 1  6. Mild major depression (HCC)  - sertraline (ZOLOFT) 50 MG tablet; Take 1 tablet (50 mg total) by mouth daily. Happy pill  Dispense: 90 tablet; Refill: 1  7. Recurrent deep vein thrombosis (DVT) of lower extremity, unspecified laterality ([place])   8. Pancreatic mass   9. Superior mesenteric artery  stenosis (HCC)  Unchanged   10. Chronic bilateral low back pain without sciatica  Taking Tylenol #3 prn only and doing well   11. Acquired hypothyroidism  - TSH

## 2020-08-17 ENCOUNTER — Other Ambulatory Visit: Payer: Self-pay | Admitting: Family Medicine

## 2020-08-17 DIAGNOSIS — E039 Hypothyroidism, unspecified: Secondary | ICD-10-CM

## 2020-08-17 MED ORDER — LEVOTHYROXINE SODIUM 100 MCG PO TABS: 100.0000 ug | ORAL_TABLET | Freq: Every day | ORAL | 0 refills | Status: DC

## 2020-09-01 ENCOUNTER — Other Ambulatory Visit: Payer: Medicare PPO | Admitting: Nurse Practitioner

## 2020-09-01 DIAGNOSIS — Z515 Encounter for palliative care: Secondary | ICD-10-CM

## 2020-09-01 DIAGNOSIS — G8929 Other chronic pain: Secondary | ICD-10-CM | POA: Diagnosis not present

## 2020-09-01 DIAGNOSIS — M545 Low back pain, unspecified: Secondary | ICD-10-CM | POA: Diagnosis not present

## 2020-09-01 NOTE — Progress Notes (Signed)
[facility] Consult Note Telephone: [phone]  Fax: [phone]  PATIENT NAME: Lori Pacheco [address] [address] [phone] (home)  DOB: [birth year] MRN: [id number]  PRIMARY CARE PROVIDER:    Steele Sizer, MD,  [address] [place] [address] [phone]  REFERRING PROVIDER:   Steele Pacheco, [place],  [place] [address] [phone]  RESPONSIBLE PARTY:   Extended Emergency Contact Information Primary Emergency Contact: Lori Pacheco of [place] Phone: [phone] Mobile Phone: [phone] Relation: Daughter Secondary Emergency Contact: Lori Pacheco States of [place] Phone: [phone] Mobile Phone: [phone] Relation: Daughter  I met face to face with patient and daughter in home.  ASSESSMENT AND RECOMMENDATIONS:   1. Advance Care Planning: Goal of care: Patient goal of care is comfort and function. Family desires for patient to maintain current function for as long as possible. Directives: DNR form present in home and on [facility] EMR. Patient reiterated desire to not be resuscitated in the event of cardiac or respiratory arrest.  Symptom Management Cognitive / Functional decline:  Poor appetite: Daughter report patient with poor appetite, patient however did not believe her appetite is poor, report eating well. Patient noted with a 6lbs weight loss from weight reported by daughter today, weight reported to be 118lb down from 127lbs at last visit a month ago. Patient denied nausea, vomiting, denied chewing and swallowing problems, report intermittent abdominal pain relieved by taking one tablet of Tylenol # 3 as needed. Recommendation: Continue current pain med regimen. Patient and family report no change in function, patient still completes her ADLs independently. Close monitoring of weight loss recommended,  may consider appetite stimulant if weight loss persists. Patient report chronic back pain aggravated by strenuous activities, patient loves to work in his yard, raking leaves. Recommended taking regular Tylenol 556m twice a day on schedule, and take Tylenol # 3 at bed time to relief pain and aid restful sleep.    Follow up Palliative Care Visit: Palliative care will continue to follow for goals of care clarification and symptom management. Return in about 4 weeks or prn.  Family /Caregiver/Community Supports: Patient lives in her house with her youngest daughter. She had 9 children, 4 of whom are living. 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  I spent 48 minutes providing this consultation, time includes time spent with patient/family, chart review, provider coordination, and documentation. More than 50% of the time in this consultation was spent coordinating communication.   HISTORY OF PRESENT ILLNESS:  Lori MILLis a 86y.o. year old femalele with multiple medical problems including monoclonal gammopathy of undetermined significance (MGUS) Chronic back pain, Depression, HLD, HTN, CKD 3, Hypothyroidism (Synthroid discontinued on 08/17/2020), Hx of PE on Eliquis. Patient was recently noted to have a mass on her pancrease on CT, further work up deemed not necessary due to patient's advanced age.  This is a follow up visit from 08/03/2020.  CODE STATUS:   PPS: 70%  HOSPICE ELIGIBILITY/DIAGNOSIS: TBD  PHYSICAL EXAM / ROS:  Current and past weights: 118lbs down from 127lbs, Ht 5107f", BMI 21.8kg/m2 General: NAD, frail appearing, thin, sitting on a couch in her basement in no acute distress, actively participating in discussion. Cardiovascular: no chest pain reported, no edema  Pulmonary: no cough, no SOB, room air GI: no swallowing issues,  appetite poor, denied constipation, denied abdominal distension,  continent of bowel GU: denies dysuria, continent of urine MSK:  no joint and ROM abnormalities, ambulatory Skin: no rashes or wounds reported Neurological: Weakness, but otherwise nonfocal  PAST MEDICAL HISTORY:  Past Medical History:  Diagnosis Date   Hyperlipidemia    Hypertension    Thyroid disease     SOCIAL HX:  Social History   Tobacco Use   Smoking status: Never Smoker   Smokeless tobacco: Former Systems developer    Types: Snuff   Tobacco comment: smoking cessation materials not required  Substance Use Topics   Alcohol use: No    Alcohol/week: 0.0 standard drinks   FAMILY HX:  Family History  Problem Relation Age of Onset   Healthy Mother    Healthy Father    Heart disease Brother    Cancer Brother    Cancer Daughter    Alcohol abuse Son    Diabetes Son    Heart disease Son    ALLERGIES:  Allergies  Allergen Reactions   Lyrica [Pregabalin]     " I felt groggy"     PERTINENT MEDICATIONS:  Outpatient Encounter Medications as of 09/01/2020  Medication Sig   acetaminophen (TYLENOL) 500 MG tablet Take 1 tablet (500 mg total) by mouth every 6 (six) hours as needed.   acetaminophen-codeine (TYLENOL #3) 300-30 MG tablet Take 1 tablet by mouth every 4 (four) hours as needed for moderate pain. (Patient not taking: Reported on 07/21/2020)   amLODipine (NORVASC) 10 MG tablet Take 1 tablet (10 mg total) by mouth daily.   Blood Glucose-BP Monitor (BLOOD GLUCOSE-WRIST BP MONITOR) DEVI 1 each by Does not apply route daily. (Patient not taking: Reported on 08/12/2020)   hydrOXYzine (ATARAX/VISTARIL) 10 MG tablet Take 1 tablet (10 mg total) by mouth at bedtime.   levocetirizine (XYZAL) 5 MG tablet Take 1 tablet (5 mg total) by mouth every evening.   levothyroxine (SYNTHROID) 100 MCG tablet Take 1 tablet (100 mcg total) by mouth daily before breakfast. M-F   lidocaine (LIDODERM) 5 % Place 2 patches onto the skin every morning. And remove it at night (Patient taking  differently: Place 1-2 patches onto the skin daily as needed (pain). (remove after 12 hours))   lisinopril (ZESTRIL) 20 MG tablet Take 1 tablet (20 mg total) by mouth daily.   lubiprostone (AMITIZA) 24 MCG capsule Take 1 capsule by mouth 2 (two) times daily. (Patient not taking: Reported on 08/12/2020)   sertraline (ZOLOFT) 50 MG tablet Take 1 tablet (50 mg total) by mouth daily. Happy pill   Vitamin D, Ergocalciferol, (DRISDOL) 1.25 MG (50000 UNIT) CAPS capsule Take 1 capsule (50,000 Units total) by mouth once a week. (Patient not taking: Reported on 08/12/2020)   No facility-administered encounter medications on file as of 09/01/2020.     Jari Favre, DNP, AGPCNP-BC

## 2020-09-02 ENCOUNTER — Other Ambulatory Visit: Payer: Self-pay

## 2020-10-06 ENCOUNTER — Other Ambulatory Visit: Payer: Medicare PPO | Admitting: Nurse Practitioner

## 2020-10-06 ENCOUNTER — Other Ambulatory Visit: Payer: Self-pay

## 2020-10-06 DIAGNOSIS — Z515 Encounter for palliative care: Secondary | ICD-10-CM

## 2020-10-06 DIAGNOSIS — I10 Essential (primary) hypertension: Secondary | ICD-10-CM

## 2020-10-06 NOTE — Progress Notes (Signed)
.    [facility] Consult Note Telephone: [phone]  Fax: [phone]  PATIENT NAME: Lori Pacheco [address] [address] [phone] (home)  DOB: 03-07-1981 MRN: [id number]  PRIMARY CARE PROVIDER:    Steele Sizer, MD,  [address] [place] [address] [phone]  REFERRING PROVIDER:   Steele Sizer, [place],   [address] [phone]  RESPONSIBLE PARTY:   Extended Emergency Contact Information Primary Emergency Contact: Lori Pacheco Phone: [phone] Mobile Phone: [phone] Relation: Daughter Secondary Emergency Contact: Lori Pacheco States of [place] Phone: [phone] Mobile Phone: [phone] Relation: Daughter  I met face to face with patient and family in home.   ASSESSMENT AND RECOMMENDATIONS:   Advance Care Planning: Patient goal of care is comfort and function. Family desires for patient to maintain current function for as long as possible.  The need to complete a MOST form was discussed, patient and daughter expressed interest in completing a MOST form today. Discussed and reviewed sections of the form in detail, opportunity for questions given, all questions answered. Form completed and signed with patient's daughter Lori Pacheco, signed form given to daughter to keep, copy of MOST form uploaded to [facility] EMR. Details of MOST form include DNR/no CPR, limited additional intervention, determine use or limitation of antibiotics when infection occurs, IV fluids for a defined trial period, feeding tube for a defined trial period. DNR form present in home and on [facility] Epic EMR. Palliative care will continue to provided support to patient, family and medical team.  Symptom Management/Cognitive / Functional decline:  Patient awake and alert, cooperative with mild confusion noted. Family  report patient continues to be independent with her ADLs, mostly walks without assistive device. Family report she has a walker, refused to use it, no report of falls in the last month. Patient still able to prepare light meals, and rake her yard, she however unable to stand for prolong period.  Hypertension: patient report feeling tired and having lightheadedness today. Blood pressure checked manually was 160/90, HR 89. Patient current Blood pressure medication include Amlodipine 14m and Lisinopril 214mdaily, med last taken last night. Family report patient takes med at bed time as patient stays up during the day, family expressed concern about falls. Validated family concerns, advised patient to continue taking med at bed time. Patient has not had lunch at the time of the visit around 2:15pm, advised patient to eat some food as this could be contributry to feeling of lightheadedness. She denied dizziness, denied chest pain, denied palpitation, no edema noted to extremity. Chronic pain relieved by taking one tablet of Tylenol # 3 as needed. Recommendation: recommend family monitor patient for SOB, increased lower extremity edema or uncontrolled headache. Encouraged family to monitor BP at home and notify her PCP if SBP is consistently greater than 140/90. May consider increasing Lisinopril dose.  Follow up Palliative Care Visit: Palliative care will continue to follow for goals of care clarification and symptom management. Return in about 4-8 weeks or prn.  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 Family very involved in her care. Daughter Lori Pacheco her main caregiver, she lives across from patient's home.  I spent 48 minutes providing this consultation, time includes time spent with patient and family, chart review, provider coordination, and documentation. More than 50% of the  time in this consultation was  spent counseling and coordinating communication.   CHIEF COMPLAINT: Follow up palliative care visit  History obtained from review of EMR, discussion with primary team, and  interview with family, and patient. Records reviewed and summarized bellow.  HISTORY OF PRESENT ILLNESS:Lori P Tottenis a 86 y.o.year old femalewith multiple medical problems including monoclonal gammopathy of undetermined significance (MGUS) Chronic back pain, Depression, HLD, HTN, CKD 3, Hypothyroidism (Synthroid discontinued on 08/17/2020), Hx of PE on Eliquis. Patient noted to have a mass on her pancrease on CT, further work up deemed not necessary due to patient's advanced age, risk from work up deemed to outweigh its benefit. This is a follow up visit from 09/01/2020.  CODE STATUS: DNR  PPS: 60%  HOSPICE ELIGIBILITY/DIAGNOSIS: TBD  PHYSICAL EXAM / ROS:   160/90, P 89, RR 16,  99% on room air General: NAD, frail appearing, thin Cardiovascular: no chest pain reported, no edema  Pulmonary: no cough, no increased SOB, room air GI: no report of swallowing issues, appetite fair, denied constipation, continent of bowel GU: denies dysuria, continent of urine MSK:  no joint and ROM abnormalities, ambulatory Skin: no rashes or wounds reported Neurological: Weakness, mild confusion Psych: non-anxious affect  PAST MEDICAL HISTORY:  Past Medical History:  Diagnosis Date   Hyperlipidemia    Hypertension    Thyroid disease     SOCIAL HX:  Social History   Tobacco Use   Smoking status: Never Smoker   Smokeless tobacco: Former Systems developer    Types: Snuff   Tobacco comment: smoking cessation materials not required  Substance Use Topics   Alcohol use: No    Alcohol/week: 0.0 standard drinks   FAMILY HX:  Family History  Problem Relation Age of Onset   Healthy Mother    Healthy Father    Heart disease Brother    Cancer Brother    Cancer Daughter    Alcohol abuse Son    Diabetes Son    Heart  disease Son     ALLERGIES:  Allergies  Allergen Reactions   Lyrica [Pregabalin]     " I felt groggy"     PERTINENT MEDICATIONS:  Outpatient Encounter Medications as of 10/06/2020  Medication Sig   acetaminophen (TYLENOL) 500 MG tablet Take 1 tablet (500 mg total) by mouth every 6 (six) hours as needed.   acetaminophen-codeine (TYLENOL #3) 300-30 MG tablet Take 1 tablet by mouth every 4 (four) hours as needed for moderate pain. (Patient not taking: Reported on 07/21/2020)   amLODipine (NORVASC) 10 MG tablet Take 1 tablet (10 mg total) by mouth daily.   Blood Glucose-BP Monitor (BLOOD GLUCOSE-WRIST BP MONITOR) DEVI 1 each by Does not apply route daily. (Patient not taking: Reported on 08/12/2020)   hydrOXYzine (ATARAX/VISTARIL) 10 MG tablet Take 1 tablet (10 mg total) by mouth at bedtime.   levocetirizine (XYZAL) 5 MG tablet Take 1 tablet (5 mg total) by mouth every evening.   levothyroxine (SYNTHROID) 100 MCG tablet Take 1 tablet (100 mcg total) by mouth daily before breakfast. M-F   lidocaine (LIDODERM) 5 % Place 2 patches onto the skin every morning. And remove it at night (Patient taking differently: Place 1-2 patches onto the skin daily as needed (pain). (remove after 12 hours))   lisinopril (ZESTRIL) 20 MG tablet Take 1 tablet (20 mg total) by mouth daily.   lubiprostone (AMITIZA) 24 MCG capsule Take 1 capsule by mouth 2 (two) times daily. (Patient not taking: Reported on 08/12/2020)  sertraline (ZOLOFT) 50 MG tablet Take 1 tablet (50 mg total) by mouth daily. Happy pill   Vitamin D, Ergocalciferol, (DRISDOL) 1.25 MG (50000 UNIT) CAPS capsule Take 1 capsule (50,000 Units total) by mouth once a week. (Patient not taking: Reported on 08/12/2020)   No facility-administered encounter medications on file as of 10/06/2020.   Thank you for the opportunity to participate in the care of Ms. Zoelle Markus. The palliative care team will continue to follow. Please call our office at  [phone] if we can be of additional assistance.   Jari Favre, DNP, AGPCNP-BC

## 2020-11-09 ENCOUNTER — Other Ambulatory Visit: Payer: Self-pay

## 2020-11-09 DIAGNOSIS — N2581 Secondary hyperparathyroidism of renal origin: Secondary | ICD-10-CM

## 2020-11-09 MED ORDER — VITAMIN D (ERGOCALCIFEROL) 1.25 MG (50000 UNIT) PO CAPS: 50000.0000 [IU] | ORAL_CAPSULE | ORAL | 1 refills | Status: DC

## 2020-11-11 ENCOUNTER — Other Ambulatory Visit: Payer: Self-pay

## 2020-11-11 ENCOUNTER — Other Ambulatory Visit: Payer: Self-pay | Admitting: Family Medicine

## 2020-11-11 DIAGNOSIS — E039 Hypothyroidism, unspecified: Secondary | ICD-10-CM

## 2020-11-11 MED ORDER — LEVOTHYROXINE SODIUM 100 MCG PO TABS: 100.0000 ug | ORAL_TABLET | Freq: Every day | ORAL | 2 refills | Status: DC

## 2020-11-11 MED ORDER — LEVOTHYROXINE SODIUM 100 MCG PO TABS: 100.0000 ug | ORAL_TABLET | Freq: Every day | ORAL | 0 refills | Status: DC

## 2020-11-11 NOTE — Addendum Note (Signed)
Addended by: Matilde Sprang on: 11/11/2020 04:03 PM   Modules accepted: Level of Service, SmartSet

## 2020-11-11 NOTE — Telephone Encounter (Signed)
Medication: levothyroxine (SYNTHROID) 100 MCG tablet Has the pt contacted their pharmacy? Yes and she says they have been trying fir a week to get this approved  Preferred pharmacy:  [facility], [place] has been out since Monday.  Please send a 10 day supply to  [facility], [place]

## 2020-11-11 NOTE — Progress Notes (Signed)
This encounter was created in error - please disregard.

## 2020-11-11 NOTE — Addendum Note (Signed)
Addended by: Matilde Sprang on: 11/11/2020 04:01 PM   Modules accepted: Orders

## 2020-11-18 ENCOUNTER — Other Ambulatory Visit: Payer: Medicare PPO | Admitting: Nurse Practitioner

## 2020-11-18 ENCOUNTER — Other Ambulatory Visit: Payer: Self-pay

## 2020-11-18 DIAGNOSIS — Z515 Encounter for palliative care: Secondary | ICD-10-CM

## 2020-11-18 DIAGNOSIS — R296 Repeated falls: Secondary | ICD-10-CM

## 2020-11-18 NOTE — Progress Notes (Signed)
[facility] Consult Note Telephone: [phone]  Fax: [phone]  PATIENT NAME: Lori Pacheco [address] [address] [phone] (home)  DOB: 1921/08/08 MRN: [id number]  PRIMARY CARE PROVIDER:    Steele Sizer, MD,  [address] [place] [address] [phone]  REFERRING PROVIDER:   Steele Sizer, [place],  [place] [address] [phone]  RESPONSIBLE PARTY:   Extended Emergency Contact Information Primary Emergency Contact: [place] of [place] Phone: [phone] Mobile Phone: [phone] Relation: Daughter Secondary Emergency Contact: Ronnette Hila States of [place] Phone: [phone] Mobile Phone: [phone] Relation: Daughter  I met face to face with patient and family in home.   ASSESSMENT AND RECOMMENDATIONS:   Advance Care Planning: Goal of care: Goal of care is comfort and function. Patient desires to maintain as much function as possible, while goal is function. Directives: Signed DNR and MOST forms in home, copies on [facility] EMR. Details of MOST form include DNR/no CPR, limited additional intervention, determine use or limitation of antibiotics when infection occurs, IV fluids for a defined trial period, feeding tube for a defined trial period. DNR form present in home and on [facility] Epic EMR. Palliative care will continue to provided support to patient, family and medical team.  Symptom Management:  High blood pressure: Patient hypertensive at last visit with complaints of headache, BP was 160/90. BP log reviewed today, SBP less than 140 in the past week. Current regimen includes Amlodipine 17m daily and Lisinopril 269mdaily. Recommendation: Continue current regimen. Monitor BP daily. Pain: Patient report pain in finger joints and lower back. Pain relieved with PRN Tylenol 50073mvery 6hrs  as needed and Tylenol #3 every 4hrs as needed for moderate to severe pain. Recommendation: continue current regimen. Recommend taking Tylenol #3 at bed time to relieve pain and aid sleep. Frequent falls: falls without injury, secondary to gait instability related to advanced age, patient refusing to use cane/walker.  Recommendation: Continue to encourage consistent use of assistive device. Encouraged to ask for assistance with chores. Recommend pacing self to prevent over exertion.  Follow up Palliative Care Visit: Palliative care will continue to follow for goals of care clarification and symptom management. Return in about 6 weeks or prn.  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 Daughter OllTrilby Leaver her main caregiver, she lives across from patient's home. Family very involved in her care.  Cognitive / Functional decline: Patient awake and alert, cooperative with mild confusion noted. Ppatient continues to be independent with her ADLs, walks without assistive device, refuses to use a walker. Family report frequent fall trying to pick wood in the bush, no report of injury. Patient able to prepare light meals, and rake her yard.    I spent 40 minutes providing this consultation, time includes time spent with patient and family, chart review, provider coordination, and documentation. More than 50% of the time in this consultation was spent counseling and coordinating communication.   CHIEF COMPLAINT: Frequent falls  History obtained from review of EMR and discussion with patient and family. Records reviewed and summarized bellow.  HISTORY OF PRESENT ILLNESS:Lori P Tottenis a [age]o.year old femalewith multiple medical problems includingmonoclonal gammopathy of undetermined significance (MGUS)Chronic back pain, Depression, HLD, HTN, CKD 3,Hypothyroidism(Synthroid discontinued on 08/17/2020), Hx of PE  on Eliquis.Patient noted to have a mass on  her pancrease on CT, further work up deemed not necessary due to patient's advanced age, risk from work-up deemed to outweigh its benefit. Patient and her family does not desire work up. This is afollow upvisit from 10/06/2020.  CODE STATUS: DNR  PPS: 60%  HOSPICE ELIGIBILITY/DIAGNOSIS: TBD  PHYSICAL EXAM / ROS:  Vital signs: BP 135/83, P 89, RR 16, 99% on room air General: frail appearing, sitting in chair in her basement in NAD Cardiovascular: denied chest pain, denied palpitation, no edema  Pulmonary: no acute cough, no increased SOB, room air GI: no report of swallowing issues, appetite fair, denied constipation, continent of bowel GU: denies dysuria, continent of urine MSK:  no joint and ROM abnormalities, ambulatory Skin: no rashes or wounds reported Neurological: Weakness, mild confusion Psych: non -anxious affect  PAST MEDICAL HISTORY:  Past Medical History:  Diagnosis Date  . Hyperlipidemia   . Hypertension   . Thyroid disease     SOCIAL HX:  Social History   Tobacco Use  . Smoking status: Never Smoker  . Smokeless tobacco: Former Systems developer    Types: Snuff  . Tobacco comment: smoking cessation materials not required  Substance Use Topics  . Alcohol use: No    Alcohol/week: 0.0 standard drinks   FAMILY HX:  Family History  Problem Relation Age of Onset  . Healthy Mother   . Healthy Father   . Heart disease Brother   . Cancer Brother   . Cancer Daughter   . Alcohol abuse Son   . Diabetes Son   . Heart disease Son     ALLERGIES:  Allergies  Allergen Reactions  . Lyrica [Pregabalin]     " I felt groggy"     PERTINENT MEDICATIONS:  Outpatient Encounter Medications as of 11/18/2020  Medication Sig  . acetaminophen (TYLENOL) 500 MG tablet Take 1 tablet (500 mg total) by mouth every 6 (six) hours as needed.  Marland Kitchen acetaminophen-codeine (TYLENOL #3) 300-30 MG tablet Take 1 tablet by mouth every 4 (four) hours as needed  for moderate pain. (Patient not taking: Reported on 07/21/2020)  . amLODipine (NORVASC) 10 MG tablet Take 1 tablet (10 mg total) by mouth daily.  . Blood Glucose-BP Monitor (BLOOD GLUCOSE-WRIST BP MONITOR) DEVI 1 each by Does not apply route daily. (Patient not taking: Reported on 08/12/2020)  . hydrOXYzine (ATARAX/VISTARIL) 10 MG tablet Take 1 tablet (10 mg total) by mouth at bedtime.  Marland Kitchen levocetirizine (XYZAL) 5 MG tablet Take 1 tablet (5 mg total) by mouth every evening.  Marland Kitchen levothyroxine (SYNTHROID) 100 MCG tablet Take 1 tablet (100 mcg total) by mouth daily before breakfast. M-F  . levothyroxine (SYNTHROID) 100 MCG tablet Take 1 tablet (100 mcg total) by mouth daily before breakfast. M-F  . lidocaine (LIDODERM) 5 % Place 2 patches onto the skin every morning. And remove it at night (Patient taking differently: Place 1-2 patches onto the skin daily as needed (pain). (remove after 12 hours))  . lisinopril (ZESTRIL) 20 MG tablet Take 1 tablet (20 mg total) by mouth daily.  Marland Kitchen lubiprostone (AMITIZA) 24 MCG capsule Take 1 capsule by mouth 2 (two) times daily. (Patient not taking: Reported on 08/12/2020)  . sertraline (ZOLOFT) 50 MG tablet Take 1 tablet (50 mg total) by mouth daily. Happy pill  . Vitamin D, Ergocalciferol, (DRISDOL) 1.25 MG (50000 UNIT) CAPS capsule Take 1 capsule (50,000 Units total) by mouth once a week.   No facility-administered encounter medications on file as of 11/18/2020.    Thank  you for the opportunity to participate in the care of Lori Pacheco. The palliative care team will continue to follow. Please call our office at [phone] if we can be of additional assistance.  Jari Favre, DNP, AGPCNP-BC

## 2020-12-14 NOTE — Progress Notes (Signed)
Name: Lori Pacheco   MRN: [id number]    DOB: Dec 07, 1920   Date:12/15/2020       Progress Note  Subjective  Chief Complaint  Follow Up  HPI  Patient came in today with her daughter ( caregiver) , Ms. Eicher and daughter are aware of her advanced age and multiple medical problems that are not managed such as CKI and also MGUS and increase in cognitive dysfunction. She is now having palliative care by [facility] visiting her once a month She is DNR  Since her last visit with me she has not been back to the [facility]. Reminded daughter that it is important to give her physical touch, fun activities and crafts - she loves having her great-grandchild - 85 yo [place] Also getting visits from palliative care   She used to have a lot of pruritis but doing well lately. She has hydroxyzine at home but has not been taking it lately   CKI with secondary hyperparathyroidism: not willing to see nephrologist at this time, reviewed labs with patient , spoke to daughter states okay to recheck labs today   CT abdomen done [facility] in 2015   1. No evidence of bowel ischemia. 2. Prominent calcified atheromatous plaque at the origin of the celiac artery and SMA resulting in mild-moderate stenosis, as above, but the distal branches remain patent. 3. Small amount of free fluid in the pelvis of unknown etiology.   Repeat CT abdomen at [facility] 01/2020   1. Right lower lobe pulmonary embolism. This was discussed with the ordering ER physician. 2. 3.3 x 1.1 cm hypoattenuating lesion within the pancreatic head, favored to represent a sidebranch IPMN. In the setting of advanced age, no definite follow-up is necessary, however, this may be further evaluated with MRI/MRCP as clinically desired. 3. Small-volume abdominal pelvic ascites with diffuse mesenteric edema. Findings are nonspecific without identifiable cause by CT. 4. No evidence of bowel obstruction as clinically questioned.  DVT left leg: Eliquis is not on her list, she  had DVT on left leg, also history of PE. She states she will verify if taking medication. No calf tenderness or swelling  MDD: daughter palliative care has been visiting her monthly , she has not been back to the [facility] since regular follow ups. She has some episodes of agitation, but doing better, daughter states 50 mg of zoloft was making her too happy we will decrease dose to 25 mg   Atherosclerosis of aorta: on eliquis, does not want any other medication such as statin therapy, advanced medical problems. She is under palliative care   Pancreatic mass: found on CT abdomen, daughter and patient are aware of the findings of pancreatic mass on CT done at [facility] showed a mass, since she has a constant epigastric pain and back pain and because of advanced age, would not tolerate therapy, she states lidoderm patch helps with pain, she has been eating more food and has gained 13 lbs since last visit   CT result :  PANCREAS: Lobulated hypoattenuating lesion within the pancreatic head measuring 3.3 x 1.1 cm (4:26). Top differential consideration would include a sidebranch IPMN.  Patient Active Problem List   Diagnosis Date Noted  . Pancytopenia ([place]) 12/15/2020  . Superior mesenteric artery stenosis ([place]) 03/02/2020  . Stage 3b chronic kidney disease ([place]) 03/02/2020  . History of pulmonary embolism 02/07/2020  . Chronic abdominal pain 02/07/2020  . Depression with anxiety 02/07/2020  . Cognitive dysfunction 12/25/2018  . Varicose veins of leg with  pain, bilateral 12/04/2018  . Deep vein thrombosis (DVT) of femoral vein of left lower extremity ([place]) 10/17/2018  . Other constipation 04/23/2018  . Leg cramps 04/23/2018  . Secondary renal hyperparathyroidism ([place]) 05/15/2017  . Atherosclerosis of abdominal aorta ([place]) 11/10/2016  . Perennial allergic rhinitis with seasonal variation 11/10/2016  . Vitamin D deficiency 03/29/2016  . Hypothyroidism 09/18/2015  . Essential hypertension 05/19/2015  .  Hyperlipemia 05/19/2015    Past Surgical History:  Procedure Laterality Date  . NO PAST SURGERIES      Family History  Problem Relation Age of Onset  . Healthy Mother   . Healthy Father   . Heart disease Brother   . Cancer Brother   . Cancer Daughter   . Alcohol abuse Son   . Diabetes Son   . Heart disease Son     Social History   Tobacco Use  . Smoking status: Never Smoker  . Smokeless tobacco: Former Systems developer    Types: Snuff  . Tobacco comment: smoking cessation materials not required  Substance Use Topics  . Alcohol use: No    Alcohol/week: 0.0 standard drinks     Current Outpatient Medications:  .  acetaminophen (TYLENOL) 500 MG tablet, Take 1 tablet (500 mg total) by mouth every 6 (six) hours as needed., Disp: 100 tablet, Rfl: 0 .  hydrOXYzine (ATARAX/VISTARIL) 10 MG tablet, Take 1 tablet (10 mg total) by mouth at bedtime., Disp: 90 tablet, Rfl: 1 .  levocetirizine (XYZAL) 5 MG tablet, Take 1 tablet (5 mg total) by mouth every evening., Disp: 90 tablet, Rfl: 1 .  levothyroxine (SYNTHROID) 100 MCG tablet, Take 1 tablet (100 mcg total) by mouth daily before breakfast. M-F, Disp: 60 tablet, Rfl: 2 .  lidocaine (LIDODERM) 5 %, Place 2 patches onto the skin every morning. And remove it at night (Patient taking differently: Place 1-2 patches onto the skin daily as needed (pain). (remove after 12 hours)), Disp: 180 patch, Rfl: 0 .  Vitamin D, Ergocalciferol, (DRISDOL) 1.25 MG (50000 UNIT) CAPS capsule, Take 1 capsule (50,000 Units total) by mouth once a week., Disp: 12 capsule, Rfl: 1 .  acetaminophen-codeine (TYLENOL #3) 300-30 MG tablet, Take 1 tablet by mouth every 4 (four) hours as needed for moderate pain. (Patient not taking: No sig reported), Disp: 30 tablet, Rfl: 0 .  amLODipine (NORVASC) 10 MG tablet, Take 1 tablet (10 mg total) by mouth daily., Disp: 90 tablet, Rfl: 1 .  Blood Glucose-BP Monitor (BLOOD GLUCOSE-WRIST BP MONITOR) DEVI, 1 each by Does not apply route daily.  (Patient not taking: No sig reported), Disp: 1 each, Rfl: 0 .  lisinopril (ZESTRIL) 20 MG tablet, Take 1 tablet (20 mg total) by mouth daily., Disp: 90 tablet, Rfl: 1 .  sertraline (ZOLOFT) 25 MG tablet, Take 1 tablet (25 mg total) by mouth daily. Happy pill, Disp: 90 tablet, Rfl: 1  Allergies  Allergen Reactions  . Lyrica [Pregabalin]     " I felt groggy"    I personally reviewed active problem list, medication list, allergies, family history, social history, health maintenance with the patient/caregiver today.   ROS  Constitutional: Negative for fever, positive for  weight change.  Respiratory: Negative for cough and shortness of breath.   Cardiovascular: Negative for chest pain or palpitations.  Gastrointestinal: positive  For intermittent  abdominal pain, but no  bowel changes.  Musculoskeletal: Negative for gait problem or joint swelling.  Skin: Negative for rash.  Neurological: Negative for dizziness or headache.  No  other specific complaints in a complete review of systems (except as listed in HPI above).  Objective  Vitals:   12/15/20 1442  BP: 132/74  Pulse: 85  Resp: 16  Temp: 98.3 F (36.8 C)  TempSrc: Oral  SpO2: 99%  Weight: 126 lb (57.2 kg)  Height: '5\' 2"'$  (1.575 m)    Body mass index is 23.05 kg/m.  Physical Exam  Constitutional: Patient appears  malnourished with temporal waisting  No distress.  HEENT: head atraumatic, normocephalic, pupils equal and reactive to light,  neck supple Cardiovascular: Normal rate, regular rhythm and normal heart sounds.  No murmur heard. No BLE edema. Pulmonary/Chest: Effort normal and breath sounds normal. No respiratory distress. Abdominal: Soft.  There is no tenderness. Psychiatric: Patient has a normal mood and affect. behavior is normal. Judgment and thought content normal.  PHQ2/9: Depression screen [facility] 2/9 12/15/2020 08/12/2020 07/21/2020 06/08/2020 03/02/2020  Decreased Interest '1 1 1 1 1  '$ Down, Depressed, Hopeless  '1 1 1 1 '$ 0  PHQ - 2 Score '2 2 2 2 1  '$ Altered sleeping 3 0 0 1 1  Tired, decreased energy '3 1 1 1 1  '$ Change in appetite 0 0 0 1 2  Feeling bad or failure about yourself  3 0 0 1 0  Trouble concentrating 0 '1 1 1 1  '$ Moving slowly or fidgety/restless 0 0 0 0 1  Suicidal thoughts 0 0 0 1 0  PHQ-9 Score '11 4 4 8 7  '$ Difficult doing work/chores - Not difficult at all Not difficult at all - Not difficult at all  Some recent data might be hidden    phq 9 is positive   Fall Risk: Fall Risk  12/15/2020 08/12/2020 07/21/2020 06/08/2020 02/07/2020  Falls in the past year? '1 1 1 1 1  '$ Comment - - - - -  Number falls in past yr: 0 '1 1 1 '$ 0  Comment - 6 - - -  Injury with Fall? 0 0 0 0 0  Risk Factor Category  - - - - -  Comment - - - - -  Risk for fall due to : - History of fall(s);Impaired balance/gait History of fall(s);Impaired balance/gait - -  Risk for fall due to: Comment - - - - -  Follow up - - Falls prevention discussed - -    Assessment & Plan  1. Benign hypertension with chronic kidney disease, stage III (HCC)  - amLODipine (NORVASC) 10 MG tablet; Take 1 tablet (10 mg total) by mouth daily.  Dispense: 90 tablet; Refill: 1 - lisinopril (ZESTRIL) 20 MG tablet; Take 1 tablet (20 mg total) by mouth daily.  Dispense: 90 tablet; Refill: 1  2. Stage 3a chronic kidney disease (HCC)  - lisinopril (ZESTRIL) 20 MG tablet; Take 1 tablet (20 mg total) by mouth daily.  Dispense: 90 tablet; Refill: 1 - COMPLETE METABOLIC PANEL WITH GFR - CBC with Differential/Platelet  3. Essential hypertension  - amLODipine (NORVASC) 10 MG tablet; Take 1 tablet (10 mg total) by mouth daily.  Dispense: 90 tablet; Refill: 1 - lisinopril (ZESTRIL) 20 MG tablet; Take 1 tablet (20 mg total) by mouth daily.  Dispense: 90 tablet; Refill: 1  4. Anxiety  - sertraline (ZOLOFT) 25 MG tablet; Take 1 tablet (25 mg total) by mouth daily. Happy pill  Dispense: 90 tablet; Refill: 1  5. Mild major depression (HCC)  -  sertraline (ZOLOFT) 25 MG tablet; Take 1 tablet (25 mg total) by mouth daily. Happy pill  Dispense: 90 tablet; Refill: 1 Doing better, but advised to resume zoloft daily and lower dose   6. Pancytopenia ([place])   7. Secondary renal hyperparathyroidism ([place])  We will only order necessary labs   8. Recurrent deep vein thrombosis (DVT) of lower extremity, unspecified laterality ([place])  Not sure if taking Eliquis   9. Pancreatic mass  No intervention desired   10. Superior mesenteric artery stenosis (HCC)  Stable, gaining weight   11. Cognitive dysfunction   12. Chronic abdominal pain   13. Chronic bilateral low back pain without sciatica   14. Pruritus  Improved   15. Atherosclerosis of abdominal aorta (HCC)  Not on statin, palliative care   16. Mixed hyperlipidemia   17. Chronic pain syndrome   18. Acquired hypothyroidism  - TSH  19. Mild protein-calorie malnutrition ([place])

## 2020-12-15 ENCOUNTER — Other Ambulatory Visit: Payer: Self-pay

## 2020-12-15 ENCOUNTER — Ambulatory Visit (INDEPENDENT_AMBULATORY_CARE_PROVIDER_SITE_OTHER): Payer: Medicare PPO | Admitting: Family Medicine

## 2020-12-15 ENCOUNTER — Encounter: Payer: Self-pay | Admitting: Family Medicine

## 2020-12-15 VITALS — BP 132/74 | HR 85 | Temp 98.3°F | Resp 16 | Ht 62.0 in | Wt 126.0 lb

## 2020-12-15 DIAGNOSIS — F320 Major depressive disorder, single episode, mild: Secondary | ICD-10-CM

## 2020-12-15 DIAGNOSIS — E039 Hypothyroidism, unspecified: Secondary | ICD-10-CM | POA: Diagnosis not present

## 2020-12-15 DIAGNOSIS — D61818 Other pancytopenia: Secondary | ICD-10-CM | POA: Diagnosis not present

## 2020-12-15 DIAGNOSIS — I129 Hypertensive chronic kidney disease with stage 1 through stage 4 chronic kidney disease, or unspecified chronic kidney disease: Secondary | ICD-10-CM

## 2020-12-15 DIAGNOSIS — I700 Atherosclerosis of aorta: Secondary | ICD-10-CM

## 2020-12-15 DIAGNOSIS — L299 Pruritus, unspecified: Secondary | ICD-10-CM

## 2020-12-15 DIAGNOSIS — G8929 Other chronic pain: Secondary | ICD-10-CM

## 2020-12-15 DIAGNOSIS — I82409 Acute embolism and thrombosis of unspecified deep veins of unspecified lower extremity: Secondary | ICD-10-CM | POA: Diagnosis not present

## 2020-12-15 DIAGNOSIS — I10 Essential (primary) hypertension: Secondary | ICD-10-CM | POA: Diagnosis not present

## 2020-12-15 DIAGNOSIS — R109 Unspecified abdominal pain: Secondary | ICD-10-CM

## 2020-12-15 DIAGNOSIS — E441 Mild protein-calorie malnutrition: Secondary | ICD-10-CM

## 2020-12-15 DIAGNOSIS — N2581 Secondary hyperparathyroidism of renal origin: Secondary | ICD-10-CM

## 2020-12-15 DIAGNOSIS — F09 Unspecified mental disorder due to known physiological condition: Secondary | ICD-10-CM

## 2020-12-15 DIAGNOSIS — K8689 Other specified diseases of pancreas: Secondary | ICD-10-CM | POA: Diagnosis not present

## 2020-12-15 DIAGNOSIS — E782 Mixed hyperlipidemia: Secondary | ICD-10-CM

## 2020-12-15 DIAGNOSIS — N183 Chronic kidney disease, stage 3 unspecified: Secondary | ICD-10-CM

## 2020-12-15 DIAGNOSIS — G894 Chronic pain syndrome: Secondary | ICD-10-CM

## 2020-12-15 DIAGNOSIS — M545 Low back pain, unspecified: Secondary | ICD-10-CM

## 2020-12-15 DIAGNOSIS — F419 Anxiety disorder, unspecified: Secondary | ICD-10-CM

## 2020-12-15 DIAGNOSIS — K551 Chronic vascular disorders of intestine: Secondary | ICD-10-CM

## 2020-12-15 DIAGNOSIS — Z515 Encounter for palliative care: Secondary | ICD-10-CM

## 2020-12-15 DIAGNOSIS — N1831 Chronic kidney disease, stage 3a: Secondary | ICD-10-CM | POA: Diagnosis not present

## 2020-12-15 MED ORDER — LISINOPRIL 20 MG PO TABS: 20.0000 mg | ORAL_TABLET | Freq: Every day | ORAL | 1 refills | Status: DC

## 2020-12-15 MED ORDER — SERTRALINE HCL 25 MG PO TABS: 25.0000 mg | ORAL_TABLET | Freq: Every day | ORAL | 1 refills | Status: DC

## 2020-12-15 MED ORDER — AMLODIPINE BESYLATE 10 MG PO TABS: 10.0000 mg | ORAL_TABLET | Freq: Every day | ORAL | 1 refills | Status: DC

## 2020-12-16 ENCOUNTER — Other Ambulatory Visit: Payer: Self-pay | Admitting: Family Medicine

## 2020-12-16 DIAGNOSIS — E039 Hypothyroidism, unspecified: Secondary | ICD-10-CM

## 2020-12-16 LAB — COMPLETE METABOLIC PANEL WITH GFR
AG Ratio: 1.4 (calc) (ref 1.0–2.5)
ALT: 10 U/L (ref 6–29)
AST: 15 U/L (ref 10–35)
Albumin: 4 g/dL (ref 3.6–5.1)
Alkaline phosphatase (APISO): 62 U/L (ref 37–153)
BUN/Creatinine Ratio: 19 (calc) (ref 6–22)
BUN: 26 mg/dL — ABNORMAL HIGH (ref 7–25)
CO2: 27 mmol/L (ref 20–32)
Calcium: 9.4 mg/dL (ref 8.6–10.4)
Chloride: 106 mmol/L (ref 98–110)
Creat: 1.37 mg/dL — ABNORMAL HIGH (ref 0.60–0.88)
GFR, Est African American: 37 mL/min/{1.73_m2} — ABNORMAL LOW (ref >=60)
GFR, Est Non African American: 32 mL/min/{1.73_m2} — ABNORMAL LOW (ref >=60)
Globulin: 2.8 g/dL (calc) (ref 1.9–3.7)
Glucose, Bld: 88 mg/dL (ref 65–99)
Potassium: 4.6 mmol/L (ref 3.5–5.3)
Sodium: 140 mmol/L (ref 135–146)
Total Bilirubin: 0.6 mg/dL (ref 0.2–1.2)
Total Protein: 6.8 g/dL (ref 6.1–8.1)

## 2020-12-16 LAB — CBC WITH DIFFERENTIAL/PLATELET
Absolute Monocytes: 220 cells/uL (ref 200–950)
Basophils Absolute: 20 cells/uL (ref 0–200)
Basophils Relative: 0.8 %
Eosinophils Absolute: 20 cells/uL (ref 15–500)
Eosinophils Relative: 0.8 %
HCT: 32.5 % — ABNORMAL LOW (ref 35.0–45.0)
Hemoglobin: 10.4 g/dL — ABNORMAL LOW (ref 11.7–15.5)
Lymphs Abs: 1055 cells/uL (ref 850–3900)
MCH: 28.6 pg (ref 27.0–33.0)
MCHC: 32 g/dL (ref 32.0–36.0)
MCV: 89.3 fL (ref 80.0–100.0)
MPV: 11.3 fL (ref 7.5–12.5)
Monocytes Relative: 8.8 %
Neutro Abs: 1185 cells/uL — ABNORMAL LOW (ref 1500–7800)
Neutrophils Relative %: 47.4 %
Platelets: 158 10*3/uL (ref 140–400)
RBC: 3.64 10*6/uL — ABNORMAL LOW (ref 3.80–5.10)
RDW: 13.2 % (ref 11.0–15.0)
Total Lymphocyte: 42.2 %
WBC: 2.5 10*3/uL — ABNORMAL LOW (ref 3.8–10.8)

## 2020-12-16 LAB — TSH: TSH: 0.1 mIU/L — ABNORMAL LOW (ref 0.40–4.50)

## 2020-12-16 MED ORDER — LEVOTHYROXINE SODIUM 88 MCG PO TABS: 88.0000 ug | ORAL_TABLET | Freq: Every day | ORAL | 0 refills | Status: DC

## 2021-01-19 ENCOUNTER — Telehealth: Payer: Medicare PPO | Admitting: Nurse Practitioner

## 2021-01-19 ENCOUNTER — Other Ambulatory Visit: Payer: Self-pay

## 2021-01-19 DIAGNOSIS — M545 Low back pain, unspecified: Secondary | ICD-10-CM

## 2021-01-19 DIAGNOSIS — Z515 Encounter for palliative care: Secondary | ICD-10-CM | POA: Diagnosis not present

## 2021-01-19 DIAGNOSIS — G8929 Other chronic pain: Secondary | ICD-10-CM

## 2021-01-19 NOTE — Progress Notes (Signed)
[place] Consult Note Telephone: [phone]  Fax: [phone]  PATIENT NAME: Lori Pacheco [address] [address] [phone] (home)  DOB: 1921-01-06 MRN: [id number]  PRIMARY CARE PROVIDER:    Steele Sizer, MD,  [address] [place] [address] [phone]  REFERRING PROVIDER:   Steele Sizer, MD [address] [place],  [place] [address] [phone]  RESPONSIBLE PARTY:   Extended Emergency Contact Information Primary Emergency Contact: Lamar of [place] Phone: [phone] Mobile Phone: [phone] Relation: Daughter Secondary Emergency Contact: Ronnette Hila [place] Phone: [phone] Mobile Phone: [phone] Relation: Daughter   This is an audio telehealth visit from my office and it was initiated and consent by this patient and her family. HIPPA policies of confidentially were discussed.  ASSESSMENT AND RECOMMENDATIONS:   Advance Care Planning: Patient's code status is DNR. Patient and daughter endorsed presence of signed DNR and MOST form in home. Patient reiterated desire to not be resuscitated in the event of cardiac or respiratory arrest.  Symptom Management:  Chronic pain: low back pain is controlled on Tylenol '500mg'$  every 6hrs as needed and Tylenol #3 at bedtime. Depression: Patient denied being sad. PCP GDR her Zoloft from '50mg'$  to '25mg'$  daily. Continue current regimen, encourage consistent daily use.  TSH 0.1, Synthroid restarted. Continue Synthroid 14mg daily. Monitor for s/s of worsening mood/depression such as isolation, tearfulness, anger, change in appetite or sleep and notify provider promptly. All questions and concerns were addressed. Patient and /Regis Billwas encouraged to call with questions and/or concerns.   Follow up Palliative Care Visit: Palliative care will continue to follow for complex  decision making and symptom management. Return in about 4 weeks or prn.  Family /Caregiver/Community Supports: Patient lives at home with one of her children. Her family very involved in her care.  Cognitive / Functional decline: Patient coherent, report ability to complete her ADLs independently. Frequent falls related to gait instability.  I spent 25 minutes providing this consultation. More than 50% of the time in this consultation was spent counseling and coordinating communication.   [facility] [place] [facility] and discussion with patient and her daughter OTrilby Leaver Records reviewed and summarized bellow.  HISTORY OF PRESENT ILLNESS:Naydeline P Tottenis a 87y.o.year old femalewith multiple medical problems includingmonoclonal gammopathy of undetermined significance (MGUS),Chronic back pain, Depression, HLD, HTN, CKD 3,Hypothyroidism, hx of PE on Eliquis.Patient noted with a mass on her pancrease on CT, further work up deemed not necessary due to patient's advanced age as risk from work-up deemed to outweigh its benefit. Patient and her family does not desire further work up.Patient was recently seen by her PCP for routine visit, her dose of Zoloft was GDR from '50mg'$  to '25mg'$ . Patient voiced no new concerns today. She denied fever, denied chills, denied SOB, denied chest pain, denied palpitation. She denied nausea or vomiting, denied uncontrolled pain. This is afollow upvisit from 11/18/2020.  CODE STATUS: DNR  PPS: 60%  HOSPICE ELIGIBILITY/DIAGNOSIS: TBD  Review of Systems:  All 10 point systems reviewed and are negative except as documented in history of present illness above   Physical Exam: Unable to complete due audio only visit   PAST MEDICAL HISTORY:  Past Medical History:  Diagnosis Date  . Hyperlipidemia   . Hypertension   . Thyroid disease     SOCIAL HX:  Social History   Tobacco Use  .  Smoking status: Never Smoker  . Smokeless tobacco:  Former Systems developer    Types: Snuff  . Tobacco comment: smoking cessation materials not required  Substance Use Topics  . Alcohol use: No    Alcohol/week: 0.0 standard drinks   FAMILY HX:  Family History  Problem Relation Age of Onset  . Healthy Mother   . Healthy Father   . Heart disease Brother   . Cancer Brother   . Cancer Daughter   . Alcohol abuse Son   . Diabetes Son   . Heart disease Son     ALLERGIES:  Allergies  Allergen Reactions  . Lyrica [Pregabalin]     " I felt groggy"     PERTINENT MEDICATIONS:  Outpatient Encounter Medications as of 01/19/2021  Medication Sig  . acetaminophen (TYLENOL) 500 MG tablet Take 1 tablet (500 mg total) by mouth every 6 (six) hours as needed.  Marland Kitchen acetaminophen-codeine (TYLENOL #3) 300-30 MG tablet Take 1 tablet by mouth every 4 (four) hours as needed for moderate pain. (Patient not taking: No sig reported)  . amLODipine (NORVASC) 10 MG tablet Take 1 tablet (10 mg total) by mouth daily.  . Blood Glucose-BP Monitor (BLOOD GLUCOSE-WRIST BP MONITOR) DEVI 1 each by Does not apply route daily. (Patient not taking: No sig reported)  . hydrOXYzine (ATARAX/VISTARIL) 10 MG tablet Take 1 tablet (10 mg total) by mouth at bedtime.  Marland Kitchen levocetirizine (XYZAL) 5 MG tablet Take 1 tablet (5 mg total) by mouth every evening.  Marland Kitchen levothyroxine (SYNTHROID) 88 MCG tablet Take 1 tablet (88 mcg total) by mouth daily before breakfast. Skip weekends, take only M-F  . lidocaine (LIDODERM) 5 % Place 2 patches onto the skin every morning. And remove it at night (Patient taking differently: Place 1-2 patches onto the skin daily as needed (pain). (remove after 12 hours))  . lisinopril (ZESTRIL) 20 MG tablet Take 1 tablet (20 mg total) by mouth daily.  . sertraline (ZOLOFT) 25 MG tablet Take 1 tablet (25 mg total) by mouth daily. Happy pill  . Vitamin D, Ergocalciferol, (DRISDOL) 1.25 MG (50000 UNIT) CAPS capsule Take 1 capsule (50,000 Units total) by mouth once a week.   No  facility-administered encounter medications on file as of 01/19/2021.    Thank you for the opportunity to participate in the care of Ms. Adelmira Mckinsey. The palliative care team will continue to follow. Please call our office at [phone] if we can be of additional assistance.  Jari Favre, DNP, AGPCNP-BC

## 2021-02-23 ENCOUNTER — Other Ambulatory Visit: Payer: Self-pay

## 2021-02-23 ENCOUNTER — Other Ambulatory Visit: Payer: Medicare PPO | Admitting: Nurse Practitioner

## 2021-02-23 VITALS — BP 154/68 | HR 75 | Resp 16

## 2021-02-23 DIAGNOSIS — R441 Visual hallucinations: Secondary | ICD-10-CM

## 2021-02-23 DIAGNOSIS — Z515 Encounter for palliative care: Secondary | ICD-10-CM | POA: Diagnosis not present

## 2021-02-23 DIAGNOSIS — F0391 Unspecified dementia with behavioral disturbance: Secondary | ICD-10-CM | POA: Diagnosis not present

## 2021-02-23 NOTE — Progress Notes (Signed)
[place] Consult Note Telephone: [phone]  Fax: [phone]   Date of encounter: 02/23/21 PATIENT NAME: Lori Pacheco [address] [place] [address]   [phone] (home)  DOB: December 18, 1920 MRN: [id number]  PRIMARY CARE PROVIDER:    Steele Sizer, MD,  [address] [place] [address] [phone]  REFERRING PROVIDER:   Steele Sizer, [place],  [place] [address] [phone]  RESPONSIBLE PARTY:    Contact Information    Name Relation Home Work Fox Daughter [phone]  [phone]   Kirstie Mirza Daughter [phone]  [phone]     I met face to face with patient and family in home. Palliative Care was asked to follow this patient by consultation request of  Steele Sizer, MD to address advance care planning and complex medical decision making. This is a follow up visit.                                 ASSESSMENT AND PLAN / RECOMMENDATIONS:   Advance Care Planning/Goals of Care: Goals include to maximize quality of life and symptom management.  CODE STATUS: DNR Goal of care: Patient's goal of care is comfort while preserving function. Directive: Signed DNR and MOST forms present in home, copies on [facility] EMR. Details of MOST form include DNR/no CPR, limited additional intervention, determine use or limitation of antibiotics, IV fluids for a defined trial period, feeding tube for a defined trial period.  Symptom Management/Plan: Visual Hallucination: consider the use of low dose Seroquel at bed time.  Heart burn: Recommended use of Maalox as needed. If symptoms persist may consider starting patient on low dose PPI.  Chronic pain: controlled on current regimen.  Follow up Palliative Care Visit: Palliative care will continue to follow for complex medical decision making, advance care planning, and clarification of goals. Return in about 4  weeks or prn.  PPS: 60% weak  HOSPICE ELIGIBILITY/DIAGNOSIS: TBD  CHIEF COMPLAIN: visual hallucination  History obtained from review of Epic EMR and discussion with patient and her family.   HISTORY OF PRESENT ILLNESS:Novelle P Tottenis a 87 y.o.year old femalewith multiple medical problems includingmonoclonal gammopathy of undetermined significance (MGUS),Chronic back pain, Depression, HLD, HTN, CKD 3,Hypothyroidism, Hx of PE on Eliquis.Patient noted to have a mass on her pancrease on CT, risk from work-up deemed to outweigh its benefit.Patient and her family does not desire work up.   Family report patient with ongoing visual hallucination in the last year that is getting worse in the last one month. Family report hallucination is associated with insomnia. Report hallucination is worse at night and occassionally during that day. Symptoms is not relieved by anything. Patient report she sees people walking up and down in her house, she however denied being scared by what she sees. Family report patient wakes up during the night and wanders within her house. Patient denied fever, denied chills, denied SOB, denied dysuria, denied any uncontrolled pain. She report heart burn and occasional nausea relieved with food, report heart burn occurring more frequently. Ten systems reviewed and are negative for acute change, except as noted in the HPI.   CT result :  PANCREAS: Lobulated hypoattenuating lesion within the pancreatic head measuring 3.3 x 1.1 cm (4:26). Top differential consideration would include a sidebranch IPMN.  I reviewed available labs, medications, imaging, studies and related documents from the EMR.  Records reviewed and summarized above.   Physical Exam: Today's Vitals   02/23/21 1430  BP: (!) 154/68  Pulse: 75  Resp: 16  SpO2: 94%   There is no height or weight on file to calculate BMI. General: frail appearing, thin, NAD EYES: anicteric sclera, no discharge  ENMT:  intact hearing, oral mucous membranes moist CV: S1S2, no LE edema Pulmonary: LCTA, no increased work of breathing, no cough, room air Abdomen: soft and non tender, no ascites GU: deferred MSK: sarcopenia, moves all extremities, ambulatory Skin: warm and dry, no rashes or wounds on visible skin Neuro: generalized weakness, mild cognitive impairment Psych: non-anxious affect, A and O x 2 Hem/lymph/immuno: no widespread bruising  Past Medical History:  Diagnosis Date  . Hyperlipidemia   . Hypertension   . Thyroid disease    Current Outpatient Medications on File Prior to Visit  Medication Sig Dispense Refill  . acetaminophen (TYLENOL) 500 MG tablet Take 1 tablet (500 mg total) by mouth every 6 (six) hours as needed. 100 tablet 0  . acetaminophen-codeine (TYLENOL #3) 300-30 MG tablet Take 1 tablet by mouth every 4 (four) hours as needed for moderate pain. (Patient not taking: No sig reported) 30 tablet 0  . amLODipine (NORVASC) 10 MG tablet Take 1 tablet (10 mg total) by mouth daily. 90 tablet 1  . Blood Glucose-BP Monitor (BLOOD GLUCOSE-WRIST BP MONITOR) DEVI 1 each by Does not apply route daily. (Patient not taking: No sig reported) 1 each 0  . hydrOXYzine (ATARAX/VISTARIL) 10 MG tablet Take 1 tablet (10 mg total) by mouth at bedtime. 90 tablet 1  . levocetirizine (XYZAL) 5 MG tablet Take 1 tablet (5 mg total) by mouth every evening. 90 tablet 1  . levothyroxine (SYNTHROID) 88 MCG tablet Take 1 tablet (88 mcg total) by mouth daily before breakfast. Skip weekends, take only M-F 90 tablet 0  . lidocaine (LIDODERM) 5 % Place 2 patches onto the skin every morning. And remove it at night (Patient taking differently: Place 1-2 patches onto the skin daily as needed (pain). (remove after 12 hours)) 180 patch 0  . lisinopril (ZESTRIL) 20 MG tablet Take 1 tablet (20 mg total) by mouth daily. 90 tablet 1  . sertraline (ZOLOFT) 25 MG tablet Take 1 tablet (25 mg total) by mouth daily. Happy pill 90  tablet 1  . Vitamin D, Ergocalciferol, (DRISDOL) 1.25 MG (50000 UNIT) CAPS capsule Take 1 capsule (50,000 Units total) by mouth once a week. 12 capsule 1   No current facility-administered medications on file prior to visit.   Thank you for the opportunity to participate in the care of Ms. Wirtz.  The palliative care team will continue to follow. Please call our office at [phone] if we can be of additional assistance.   Jari Favre, DNP, AGPCNP-BC  COVID-19 PATIENT SCREENING TOOL Asked and negative response unless otherwise noted:   Have you had symptoms of covid, tested positive or been in contact with someone with symptoms/positive test in the past 5-10 days?

## 2021-03-08 ENCOUNTER — Other Ambulatory Visit: Payer: Self-pay | Admitting: Family Medicine

## 2021-03-23 DIAGNOSIS — E782 Mixed hyperlipidemia: Secondary | ICD-10-CM | POA: Diagnosis not present

## 2021-03-23 DIAGNOSIS — M5451 Vertebrogenic low back pain: Secondary | ICD-10-CM | POA: Diagnosis not present

## 2021-03-23 DIAGNOSIS — E039 Hypothyroidism, unspecified: Secondary | ICD-10-CM | POA: Diagnosis not present

## 2021-03-23 DIAGNOSIS — Z7189 Other specified counseling: Secondary | ICD-10-CM | POA: Diagnosis not present

## 2021-03-23 DIAGNOSIS — I10 Essential (primary) hypertension: Secondary | ICD-10-CM | POA: Diagnosis not present

## 2021-03-23 DIAGNOSIS — Z7902 Long term (current) use of antithrombotics/antiplatelets: Secondary | ICD-10-CM | POA: Diagnosis not present

## 2021-03-30 ENCOUNTER — Other Ambulatory Visit: Payer: Self-pay

## 2021-03-30 ENCOUNTER — Other Ambulatory Visit: Payer: Medicare PPO | Admitting: Nurse Practitioner

## 2021-03-30 VITALS — BP 138/60 | HR 75 | Resp 16

## 2021-03-30 DIAGNOSIS — K219 Gastro-esophageal reflux disease without esophagitis: Secondary | ICD-10-CM | POA: Diagnosis not present

## 2021-03-30 DIAGNOSIS — Z515 Encounter for palliative care: Secondary | ICD-10-CM | POA: Diagnosis not present

## 2021-03-30 MED ORDER — OMEPRAZOLE 20 MG PO CPDR: 20.0000 mg | DELAYED_RELEASE_CAPSULE | Freq: Every day | ORAL | 3 refills | Status: DC

## 2021-03-30 NOTE — Progress Notes (Signed)
[facility] Consult Note Telephone: [phone]  Fax: [phone]    Date of encounter: 03/30/21 PATIENT NAME: Lori Pacheco [address] [place] [address]   [phone] (home)  DOB: Jan 15, 1921 MRN: [id number]  PRIMARY CARE PROVIDER:    Steele Sizer, MD,  [address] [place] [address] [phone]  REFERRING PROVIDER:   Steele Sizer, [place],  [place] [address] [phone]  RESPONSIBLE PARTY:    Contact Information     Name Relation Home Work [place] Daughter [phone]  [phone]   Kirstie Mirza Daughter [phone]  [phone]     I met face to face with patient in home. Her daughter [name] was present during visit. Palliative Care was asked to follow this patient by consultation request of  Steele Sizer, MD to address advance care planning and complex medical decision making. This is a follow up visit.                                  ASSESSMENT AND PLAN / RECOMMENDATIONS:   Advance Care Planning/Goals of Care:  CODE STATUS: DNR Goal of care:  Patient's goal of care is comfort while preserving function. Directives: Signed DNR and MOST forms present in home, copies on [facility] EMR. Details of MOST form include limited additional intervention, determine use or limitation of antibiotics, IV fluids for a defined trial period, feeding tube for a defined trial period.    Symptom Management/Plan: GERD: start Omeprazole 60m by mouth daily. #30, no refill.  Prescription sent to patient's [facility] pharmacy.   Follow up Palliative Care Visit: Palliative care will continue to follow for complex medical decision making, advance care planning, and clarification of goals. Return in about 5 weeks or prn.  PPS: 60% weak  HOSPICE ELIGIBILITY/DIAGNOSIS: TBD  CHIEF COMPLAIN: heart burn  History obtained from review of Epic EMR and  discussion with patient and her daughter OTrilby Leaver  HISTORY OF PRESENT ILLNESS:  JBARBARA AHARTis a 87y.o. year old female with multiple medical problems including monoclonal gammopathy of undetermined significance (MGUS), Chronic back pain, Depression, HLD, HTN, CKD 3, Hypothyroidism, Hx of PE on Eliquis. Patient noted to have a mass on her pancrease on CT, risk from work-up deemed to outweigh its benefit. Patient and her family chose not to pursue further diagnostics or treatment. Patient report ongoing heartburn not relieved with Tums and Maalox. Heartburn is associated with occasional nausea, no vomiting. She denied difficulty swallowing, bleeding or acute weight loss. She denied fever, denied chills, denied SOB.  I reviewed available labs, medications, imaging, studies and related documents from the EMR.  Records reviewed and summarized above.   Vitals with BMI 03/30/2021 02/23/2021 12/15/2020  Height - - _0   Weight - - 126 lbs  BMI - - 224.82 Systolic [id number] Diastolic 60 68 74  Pulse 75 75 85    Physical Exam: Current and past weights: Constitutional: NAD General: frail appearing, thin/WNWD/obese  EYES: anicteric sclera, lids intact, no discharge  ENMT: intact hearing, oral mucous membranes moist, dentition intact CV: S1S2, RRR, no LE edema Pulmonary: LCTA, no increased work of breathing, no cough, room air Abdomen: intake 100%, normo-active BS + 4 quadrants, soft and non tender, no ascites GU: deferred MSK: no sarcopenia, moves all extremities, ambulatory Skin: warm and dry,  no rashes or wounds on visible skin Neuro:  no generalized weakness,  no cognitive impairment Psych: non-anxious affect, A and O x 3 Hem/lymph/immuno: no widespread bruising  Past Medical History:  Diagnosis Date   Hyperlipidemia    Hypertension    Thyroid disease     Thank you for the opportunity to participate in the care of Ms. Metzinger.  The palliative care team will continue to follow. Please  call our office at [phone] if we can be of additional assistance.   Jari Favre, DNP, AGPCNP-BC  COVID-19 PATIENT SCREENING TOOL Asked and negative response unless otherwise noted:   Have you had symptoms of covid, tested positive or been in contact with someone with symptoms/positive test in the past 5-10 days?

## 2021-04-02 ENCOUNTER — Other Ambulatory Visit: Payer: Self-pay | Admitting: Family Medicine

## 2021-04-02 DIAGNOSIS — N2581 Secondary hyperparathyroidism of renal origin: Secondary | ICD-10-CM

## 2021-04-15 NOTE — Progress Notes (Signed)
Name: Lori Pacheco   MRN: [id number]    DOB: 09-19-1921   Date:04/16/2021       Progress Note  Subjective  Chief Complaint  Follow Up  HPI  Patient came in today with her daughter [name] -lives next door, patient's youngest daughter - [name] - lives with her)  , Ms. Voong and daughter are aware of her advanced age and multiple medical problems that are not managed such as CKI and also MGUS and increase in cognitive dysfunction. She is now having palliative care by [facility] visiting her once a month She is DNR  She has been doing better since she comes in more often , no visits to the [facility]. She has been seeing family members more often. She loves having her great-grandchild - 60 yo [place] Also getting visits from palliative care   She used to have a lot of pruritis but doing well lately. She has hydroxyzine at home but has not been taking it lately . Unchanged   CKI with secondary hyperparathyroidism: not willing to see nephrologist at this time, reviewed labs with patient her last GFR iimproved from 27 to 37   CT abdomen done [facility] in 2015   1.  No evidence of bowel ischemia. 2.  Prominent calcified atheromatous plaque at the origin of the celiac artery and SMA resulting in mild-moderate stenosis, as above, but the distal branches remain patent. 3.  Small amount of free fluid in the pelvis of unknown etiology.    Re[facility] CT [facility] 01/2020   1. Right lower lobe pulmonary embolism. This was discussed with the ordering ER physician. 2. 3.3 x 1.1 cm hypoattenuating lesion within the pancreatic head, favored to represent a sidebranch IPMN. In the setting of advanced age, no definite follow-up is necessary, however, this may be further evaluated with MRI/MRCP as clinically desired. 3. Small-volume abdominal pelvic ascites with diffuse mesenteric edema. Findings are nonspecific without identifiable cause by CT. 4. No evidence of bowel obstruction as clinically questioned.   DVT left leg:  Eliquis is not on her list, she had DVT on left leg, also history of PE. She states she will verify if taking medication. No calf tenderness or swelling. Unchanged   MDD: daughter palliative care has been visiting her monthly.She is off sertraline, having more contact with family and seems to be doing better.   Atherosclerosis of aorta/ she also has mesenteric artery stenosis: on eliquis, does not want any other medication such as statin therapy, advanced medical problems. She is under palliative care  Pancreatic mass: found on CT abdomen, daughter and patient are aware of the findings of pancreatic mass on CT done at [facility] showed a mass, since she has a constant epigastric pain and back pain and because of advanced age, would not tolerate therapy, she states lidoderm patch helps with pain, she lost weight again, discussed high calorie diet  CT result :  PANCREAS: Lobulated hypoattenuating lesion within the pancreatic head measuring 3.3 x 1.1 cm (4:26). Top differential consideration would include a sidebranch IPMN.  Constipation: she has complained to her daughter that she does not eat much because she feels bloated and full, like she has to use the bathroom, she does not let her daughter see her stools, advised to try half cap full of Miralax and monitor. Explained importance of calorie intake   Hypothyroidism: she was supposed to go down to 88 mcg 4 days a week but is still taking 5 days a week,  it may be the cause of weight loss, we will change to 75 mcg 5 days a week and recheck next visit   Patient Active Problem List   Diagnosis Date Noted   Pancytopenia ([place]) 12/15/2020   Superior mesenteric artery stenosis ([place]) 03/02/2020   Stage 3b chronic kidney disease ([place]) 03/02/2020   History of pulmonary embolism 02/07/2020   Chronic abdominal pain 02/07/2020   Depression with anxiety 02/07/2020   Cognitive dysfunction 12/25/2018   Varicose veins of leg with pain, bilateral 12/04/2018   Deep  vein thrombosis (DVT) of femoral vein of left lower extremity ([place]) 10/17/2018   Other constipation 04/23/2018   Leg cramps 04/23/2018   Secondary renal hyperparathyroidism ([place]) 05/15/2017   Atherosclerosis of abdominal aorta ([place]) 11/10/2016   Perennial allergic rhinitis with seasonal variation 11/10/2016   Vitamin D deficiency 03/29/2016   Hypothyroidism 09/18/2015   Essential hypertension 05/19/2015   Hyperlipemia 05/19/2015    Past Surgical History:  Procedure Laterality Date   NO PAST SURGERIES      Family History  Problem Relation Age of Onset   Healthy Mother    Healthy Father    Heart disease Brother    Cancer Brother    Cancer Daughter    Alcohol abuse Son    Diabetes Son    Heart disease Son     Social History   Tobacco Use   Smoking status: Never   Smokeless tobacco: Former    Types: Snuff    Quit date: 09/27/1986   Tobacco comments:    smoking cessation materials not required  Substance Use Topics   Alcohol use: No    Alcohol/week: 0.0 standard drinks     Current Outpatient Medications:    acetaminophen (TYLENOL) 500 MG tablet, Take 1 tablet (500 mg total) by mouth every 6 (six) hours as needed., Disp: 100 tablet, Rfl: 0   acetaminophen-codeine (TYLENOL #3) 300-30 MG tablet, Take 1 tablet by mouth every 4 (four) hours as needed for moderate pain., Disp: 30 tablet, Rfl: 0   amLODipine (NORVASC) 10 MG tablet, Take 1 tablet (10 mg total) by mouth daily., Disp: 90 tablet, Rfl: 1   Blood Glucose-BP Monitor (BLOOD GLUCOSE-WRIST BP MONITOR) DEVI, 1 each by Does not apply route daily., Disp: 1 each, Rfl: 0   hydrOXYzine (ATARAX/VISTARIL) 10 MG tablet, Take 1 tablet (10 mg total) by mouth at bedtime., Disp: 90 tablet, Rfl: 1   levocetirizine (XYZAL) 5 MG tablet, Take 1 tablet (5 mg total) by mouth every evening., Disp: 90 tablet, Rfl: 1   levothyroxine (SYNTHROID) 88 MCG tablet, Take 1 tablet (88 mcg total) by mouth daily before breakfast. Skip weekends, take  only M-F, Disp: 90 tablet, Rfl: 0   lidocaine (LIDODERM) 5 %, Place 2 patches onto the skin every morning. And remove it at night (Patient taking differently: Place 1-2 patches onto the skin daily as needed (pain). (remove after 12 hours)), Disp: 180 patch, Rfl: 0   lisinopril (ZESTRIL) 20 MG tablet, Take 1 tablet (20 mg total) by mouth daily., Disp: 90 tablet, Rfl: 1   omeprazole (PRILOSEC) 20 MG capsule, Take 1 capsule (20 mg total) by mouth daily., Disp: 30 capsule, Rfl: 3   sertraline (ZOLOFT) 25 MG tablet, Take 1 tablet (25 mg total) by mouth daily. Happy pill, Disp: 90 tablet, Rfl: 1   Vitamin D, Ergocalciferol, (DRISDOL) 1.25 MG (50000 UNIT) CAPS capsule, TAKE 1 CAPSULE ONE TIME WEEKLY, Disp: 12 capsule, Rfl: 1  Allergies  Allergen Reactions  Lyrica [Pregabalin]     " I felt groggy"    I personally reviewed active problem list, medication list, allergies, family history, social history, health maintenance, notes from last encounter with the patient/caregiver today.   ROS  Constitutional: Negative for fever , positive for weight change.  Respiratory: Negative for cough and shortness of breath.   Cardiovascular: Negative for chest pain or palpitations.  Gastrointestinal: positive  for abdominal pain, no bowel changes.  Musculoskeletal: Negative for gait problem or joint swelling.  Skin: Negative for rash.  Neurological: Negative for dizziness or headache.  No other specific complaints in a complete review of systems (except as listed in HPI above).   Objective  Vitals:   04/16/21 0957  BP: 138/70  Pulse: 93  Resp: 15  Temp: 98.8 F (37.1 C)  SpO2: 99%  Weight: 119 lb (54 kg)  Height: '5\' 2"'$  (1.575 m)    Body mass index is 21.77 kg/m.  Physical Exam  Constitutional: Patient appears well-developed and malnourished with temporal waisting  No distress.  HEENT: head atraumatic, normocephalic, pupils equal and reactive to light, neck supple Cardiovascular: Normal rate,  regular rhythm and normal heart sounds.  No murmur heard. No BLE edema. Pulmonary/Chest: Effort normal and breath sounds normal. No respiratory distress. Abdominal: Soft.  There is no tenderness. Psychiatric: Patient has a normal mood and affect. behavior is normal. Judgment and thought content normal.   PHQ2/9: Depression screen [facility] 2/9 04/16/2021 12/15/2020 08/12/2020 07/21/2020 06/08/2020  Decreased Interest 0 '1 1 1 1  '$ Down, Depressed, Hopeless 0 '1 1 1 1  '$ PHQ - 2 Score 0 '2 2 2 2  '$ Altered sleeping 0 3 0 0 1  Tired, decreased energy 0 '3 1 1 1  '$ Change in appetite 3 0 0 0 1  Feeling bad or failure about yourself  0 3 0 0 1  Trouble concentrating 1 0 '1 1 1  '$ Moving slowly or fidgety/restless 0 0 0 0 0  Suicidal thoughts 0 0 0 0 1  PHQ-9 Score '4 11 4 4 8  '$ Difficult doing work/chores Not difficult at all - Not difficult at all Not difficult at all -  Some recent data might be hidden    phq 9 is positive  Fall Risk: Fall Risk  04/16/2021 12/15/2020 08/12/2020 07/21/2020 06/08/2020  Falls in the past year? 0 '1 1 1 1  '$ Comment - - - - -  Number falls in past yr: 0 0 '1 1 1  '$ Comment - - 6 - -  Injury with Fall? 0 0 0 0 0  Risk Factor Category  - - - - -  Comment - - - - -  Risk for fall due to : - - History of fall(s);Impaired balance/gait History of fall(s);Impaired balance/gait -  Risk for fall due to: Comment - - - - -  Follow up - - - Falls prevention discussed -     Functional Status Survey: Is the patient deaf or have difficulty hearing?: No Does the patient have difficulty seeing, even when wearing glasses/contacts?: No Does the patient have difficulty concentrating, remembering, or making decisions?: No Does the patient have difficulty walking or climbing stairs?: Yes Does the patient have difficulty dressing or bathing?: No Does the patient have difficulty doing errands alone such as visiting a doctor's office or shopping?: Yes    Assessment & Plan  1. Essential hypertension  -  amLODipine (NORVASC) 10 MG tablet; Take 1 tablet (10 mg total) by mouth daily.  Dispense: 90 tablet; Refill: 1 - lisinopril (ZESTRIL) 20 MG tablet; Take 1 tablet (20 mg total) by mouth daily.  Dispense: 90 tablet; Refill: 1  2. Pancytopenia (HCC)   3. Stage 3a chronic kidney disease (HCC)  - lisinopril (ZESTRIL) 20 MG tablet; Take 1 tablet (20 mg total) by mouth daily.  Dispense: 90 tablet; Refill: 1  4. Mild major depression ([place])   5. Chronic abdominal pain  Seems to be doing better   6. Pancreatic mass   7. Superior mesenteric artery stenosis ([place])   8. Cognitive dysfunction   9. Chronic bilateral low back pain without sciatica   10. Atherosclerosis of abdominal aorta ([place])   11. Chronic pain syndrome  - acetaminophen-codeine (TYLENOL #3) 300-30 MG tablet; Take 1 tablet by mouth every 4 (four) hours as needed for moderate pain.  Dispense: 30 tablet; Refill: 0  12. Palliative care patient   13. Neuropathic pain of flank, right   14. Lower leg DVT (deep venous thromboembolism), acute, left ([place])   15. Moderate protein malnutrition ([place])   16. Intermittent constipation  - polyethylene glycol powder (GLYCOLAX/MIRALAX) 17 GM/SCOOP powder; Take 127.5 g by mouth daily with breakfast.  Dispense: 3350 g; Refill: 1  17. Benign hypertension with chronic kidney disease, stage III (HCC)  - amLODipine (NORVASC) 10 MG tablet; Take 1 tablet (10 mg total) by mouth daily.  Dispense: 90 tablet; Refill: 1 - lisinopril (ZESTRIL) 20 MG tablet; Take 1 tablet (20 mg total) by mouth daily.  Dispense: 90 tablet; Refill: 1  18. Acquired hypothyroidism  - levothyroxine (SYNTHROID) 75 MCG tablet; Take 1 tablet (75 mcg total) by mouth daily before breakfast. Skip weekends, take only M-F  Dispense: 90 tablet; Refill: 0

## 2021-04-16 ENCOUNTER — Other Ambulatory Visit: Payer: Self-pay

## 2021-04-16 ENCOUNTER — Encounter: Payer: Self-pay | Admitting: Family Medicine

## 2021-04-16 ENCOUNTER — Ambulatory Visit: Payer: Medicare PPO | Admitting: Family Medicine

## 2021-04-16 ENCOUNTER — Telehealth: Payer: Self-pay | Admitting: Family Medicine

## 2021-04-16 VITALS — BP 138/70 | HR 93 | Temp 98.8°F | Resp 15 | Ht 62.0 in | Wt 119.0 lb

## 2021-04-16 DIAGNOSIS — M545 Low back pain, unspecified: Secondary | ICD-10-CM

## 2021-04-16 DIAGNOSIS — F09 Unspecified mental disorder due to known physiological condition: Secondary | ICD-10-CM

## 2021-04-16 DIAGNOSIS — K8689 Other specified diseases of pancreas: Secondary | ICD-10-CM | POA: Diagnosis not present

## 2021-04-16 DIAGNOSIS — D61818 Other pancytopenia: Secondary | ICD-10-CM | POA: Diagnosis not present

## 2021-04-16 DIAGNOSIS — N1831 Chronic kidney disease, stage 3a: Secondary | ICD-10-CM

## 2021-04-16 DIAGNOSIS — K551 Chronic vascular disorders of intestine: Secondary | ICD-10-CM

## 2021-04-16 DIAGNOSIS — I10 Essential (primary) hypertension: Secondary | ICD-10-CM

## 2021-04-16 DIAGNOSIS — N183 Chronic kidney disease, stage 3 unspecified: Secondary | ICD-10-CM

## 2021-04-16 DIAGNOSIS — K5909 Other constipation: Secondary | ICD-10-CM

## 2021-04-16 DIAGNOSIS — M792 Neuralgia and neuritis, unspecified: Secondary | ICD-10-CM

## 2021-04-16 DIAGNOSIS — R109 Unspecified abdominal pain: Secondary | ICD-10-CM

## 2021-04-16 DIAGNOSIS — F320 Major depressive disorder, single episode, mild: Secondary | ICD-10-CM | POA: Diagnosis not present

## 2021-04-16 DIAGNOSIS — I700 Atherosclerosis of aorta: Secondary | ICD-10-CM

## 2021-04-16 DIAGNOSIS — E440 Moderate protein-calorie malnutrition: Secondary | ICD-10-CM

## 2021-04-16 DIAGNOSIS — I824Z2 Acute embolism and thrombosis of unspecified deep veins of left distal lower extremity: Secondary | ICD-10-CM

## 2021-04-16 DIAGNOSIS — E039 Hypothyroidism, unspecified: Secondary | ICD-10-CM

## 2021-04-16 DIAGNOSIS — G894 Chronic pain syndrome: Secondary | ICD-10-CM

## 2021-04-16 DIAGNOSIS — G8929 Other chronic pain: Secondary | ICD-10-CM

## 2021-04-16 DIAGNOSIS — Z515 Encounter for palliative care: Secondary | ICD-10-CM

## 2021-04-16 DIAGNOSIS — I129 Hypertensive chronic kidney disease with stage 1 through stage 4 chronic kidney disease, or unspecified chronic kidney disease: Secondary | ICD-10-CM

## 2021-04-16 MED ORDER — AMLODIPINE BESYLATE 10 MG PO TABS: 10.0000 mg | ORAL_TABLET | Freq: Every day | ORAL | 1 refills | Status: DC

## 2021-04-16 MED ORDER — LEVOTHYROXINE SODIUM 75 MCG PO TABS: 75.0000 ug | ORAL_TABLET | Freq: Every day | ORAL | 0 refills | Status: DC

## 2021-04-16 MED ORDER — POLYETHYLENE GLYCOL 3350 17 GM/SCOOP PO POWD: 0.5000 | Freq: Every day | ORAL | 1 refills | Status: DC

## 2021-04-16 MED ORDER — POLYETHYLENE GLYCOL 3350 17 GM/SCOOP PO POWD: 17.0000 g | Freq: Every day | ORAL | 1 refills | Status: DC

## 2021-04-16 MED ORDER — LISINOPRIL 20 MG PO TABS: 20.0000 mg | ORAL_TABLET | Freq: Every day | ORAL | 1 refills | Status: DC

## 2021-04-16 MED ORDER — ACETAMINOPHEN-CODEINE #3 300-30 MG PO TABS: 1.0000 | ORAL_TABLET | ORAL | 0 refills | Status: DC | PRN

## 2021-04-16 NOTE — Telephone Encounter (Signed)
Anne calling from [place] regarding polyethylene glycol powder (GLYCOLAX/MIRALAX) 17 GM/SCOOP powder [id number] - question about the sig- usually 17g is prescribed. Sig states '157mg'$  that is half the bottle. Please advise CB- [phone]

## 2021-04-16 NOTE — Patient Instructions (Addendum)
Levothyroid 88 mcg only 4 days a week When you get new rx of 75 mcg can take it M-F  High-Protein and High-Calorie Diet Eating high-protein and high-calorie foods can help you to gain weight, heal after an injury, and recover after an illness or surgery. The specific amount of daily protein and calories you need depends on: Your body weight. The reason this diet is recommended for you. Generally, a high-protein, high-calorie diet involves: Eating 250-500 extra calories each day. Making sure that you get enough of your daily calories from protein. Ask your health care provider how many of your calories should come from protein. Talk with a health care provider or a dietitian about how much protein and how many calories you need each day. Follow the diet as directed by your healthcare provider. What are tips for following this plan?  Reading food labels Check the nutrition facts label for calories, grams of fat and protein. Items with more than 4 grams of protein are high-protein foods. Preparing meals Add whole milk, half-and-half, or heavy cream to cereal, pudding, soup, or hot cocoa. Add whole milk to instant breakfast drinks. Add peanut butter to oatmeal or smoothies. Add powdered milk to baked goods, smoothies, or milkshakes. Add powdered milk, cream, or butter to mashed potatoes. Add cheese to cooked vegetables. Make whole-milk yogurt parfaits. Top them with granola, fruit, or nuts. Add cottage cheese to fruit. Add avocado, cheese, or both to sandwiches or salads. Add avocado to smoothies. Add meat, poultry, or seafood to rice, pasta, casseroles, salads, and soups. Use mayonnaise when making egg salad, chicken salad, or tuna salad. Use peanut butter as a dip for fruits and vegetables or as a topping for pretzels, celery, or crackers. Add beans to casseroles, dips, and spreads. Add pureed beans to sauces and soups. Replace calorie-free drinks with calorie-containing drinks, such as  milk and fruit juice. Replace water with milk or heavy cream when making foods such as oatmeal, pudding, or cocoa. Add oil or butter to cooked vegetables and grains. Add cream cheese to sandwiches or as a topping on crackers and bread. Make cream-based pastas and soups. General information Ask your health care provider if you should take a nutritional supplement. Try to eat six small meals each day instead of three large meals. A general goal is to eat every 2 to 3 hours. Eat a balanced diet. In each meal, include one food that is high in protein and one food with fat in it. Keep nutritious snacks available, such as nuts, trail mixes, dried fruit, and yogurt. If you have kidney disease or diabetes, talk with your health care provider about how much protein is safe for you. Too much protein may put extra stress on your kidneys. Drink your calories. Choose high-calorie drinks and have them after your meals. Consider setting a timer to remind you to eat. You will want to eat even if you do not feel very hungry. What high-protein foods should I eat?  Vegetables Soybeans. Peas. Grains Quinoa. Bulgur wheat. Buckwheat. Meats and other proteins Beef, pork, and poultry. Fish and seafood. Eggs. Tofu. Textured vegetable protein (TVP). Peanut butter. Nuts and seeds. Dried beans. Protein powders.Hummus. Dairy Whole milk. Whole-milk yogurt. Powdered milk. Cheese. Yahoo. Eggnog. Beverages High-protein supplement drinks. Soy milk. Other foods Protein bars. The items listed above may not be a complete list of foods and beverages you can eat and drink. Contact a dietitian for more information. What high-calorie foods should I eat? Fruits Dried fruit.  Fruit leather. Canned fruit in syrup. Fruit juice. Avocado. Vegetables Vegetables cooked in oil or butter. Fried potatoes. Grains Pasta. Quick breads. Muffins. Pancakes. Ready-to-eat cereal. Meats and other proteins Peanut butter. Nuts and  seeds. Dairy Heavy cream. Whipped cream. Cream cheese. Sour cream. Ice cream. Custard.Pudding. Whole milk dairy products. Beverages Meal-replacement beverages. Nutrition shakes. Fruit juice. Seasonings and condiments Salad dressing. Mayonnaise. Alfredo sauce. Fruit preserves or jelly. Honey.Syrup. Sweets and desserts Cake. Cookies. Pie. Pastries. Candy bars. Chocolate. Fats and oils Butter or margarine. Oil. Gravy. Other foods Meal-replacement bars. The items listed above may not be a complete list of foods and beverages you can eat and drink. Contact a dietitian for more information. Summary A high-protein, high-calorie diet can help you gain weight or heal faster after an injury, illness, or surgery. To increase your protein and calories, add ingredients such as whole milk, peanut butter, cheese, beans, meat, or seafood to meal items. To get enough extra calories each day, include high-calorie foods and beverages at each meal. Adding a high-calorie drink or shake can be an easy way to help you get enough calories each day. Talk with your healthcare provider or dietitian about the best options for you. This information is not intended to replace advice given to you by your health care provider. Make sure you discuss any questions you have with your healthcare provider. Document Revised: 09/06/2020 Document Reviewed: 09/06/2020 Elsevier Patient Education  2022 [employer].

## 2021-05-06 ENCOUNTER — Other Ambulatory Visit: Payer: Medicare PPO | Admitting: *Deleted

## 2021-05-06 ENCOUNTER — Other Ambulatory Visit: Payer: Medicare PPO

## 2021-05-06 ENCOUNTER — Other Ambulatory Visit: Payer: Self-pay

## 2021-05-06 VITALS — BP 126/69 | HR 68 | Temp 97.7°F | Resp 17

## 2021-05-06 DIAGNOSIS — Z515 Encounter for palliative care: Secondary | ICD-10-CM

## 2021-05-06 NOTE — Progress Notes (Signed)
COMMUNITY PALLIATIVE CARE SW NOTE  PATIENT NAME: Lori Pacheco DOB: May 26, 1921 MRN: [id number]  PRIMARY CARE PROVIDER: Steele Sizer, MD  RESPONSIBLE PARTY:  Acct ID - Guarantor Home Phone Work Phone Relationship Acct Type  [id number] - [name],[name]  Self P/F     [address], [place], [place] [address]     PLAN OF CARE and INTERVENTIONS:             GOALS OF CARE/ ADVANCE CARE PLANNING:  Goal is to keep patient at home as long possible.Patient is a DNR. SOCIAL/EMOTIONAL/SPIRITUAL ASSESSMENT/ INTERVENTIONS:  SW and RN- M. Nadara Mustard completed a follow-up visit with patient at her home. Her two daughters was present with her. Patient was sitting on her couch, awake and alert. She denied pain, but report that she does have intermittent pain to her great toe on her left foot. She also report intermittent pain to her chest and back. She has a pain patch that helps. Patient report issues with her bowels, but she uses clirmax in her coffee daily. She sleeps well and take cats naps during the day. Patient is independent for ADL's. Her appetite  and fluid intake is good as she takes 3 meals with snacks daily. Patient ambulates with cane. Her daughters express that patient gets frustrated with not being able to do the things she used to do. The team provided encouragement and SW provided adjustment counseling. Patient and family remain open to ongoing palliative care visits and support as needed.  PATIENT/CAREGIVER EDUCATION/ COPING:  Patient is coping adequately. She has a supportive family. PERSONAL EMERGENCY PLAN:  911 can be activated for emergencies. COMMUNITY RESOURCES COORDINATION/ HEALTH CARE NAVIGATION:  None FINANCIAL/LEGAL CONCERNS/INTERVENTIONS:  None.     SOCIAL HX:  Social History   Tobacco Use   Smoking status: Never   Smokeless tobacco: Former    Types: Snuff    Quit date: 09/27/1986   Tobacco comments:    smoking cessation materials not required  Substance Use  Topics   Alcohol use: No    Alcohol/week: 0.0 standard drinks    CODE STATUS: Patient is a DNR ADVANCED DIRECTIVES: Yes MOST FORM COMPLETE:  Yes HOSPICE EDUCATION PROVIDED: No  PPS: Patient is alert and oriented x3. She is independent for ADL's. She ambulates with a cane.  Duration of visit and documentation: 60 minutes   Katheren Puller, LCSW

## 2021-05-11 NOTE — Progress Notes (Signed)
[facility] RN NOTE  PATIENT NAME: Lori Pacheco DOB: 06-Feb-1921 MRN: [id number]  PRIMARY CARE PROVIDER: Steele Sizer, MD  RESPONSIBLE PARTY:  Acct ID - Guarantor Home Phone Work Phone Relationship Acct Type  [id number] - Preast,[name]  Self P/F     [address], [place], [place] [address]   Covid-19 Pre-screening Negative  PLAN OF CARE and INTERVENTION:  ADVANCE CARE PLANNING/GOALS OF CARE: Goal is for patient to remain in her home and avoid hospitalizations PATIENT/CAREGIVER EDUCATION: Symptom management, safe mobility DISEASE STATUS: Joint palliative care follow up visit made with Katheren Puller, MSW. Upon arrival patient is sitting up on her couch awake and alert. She is pleasant and able to engage in appropriate conversation. She experiences pain at times in her lower back and uses a Lidocaine patch to help. She also has pain in her bilateral great toes. Patient clips her own toenails but they are somewhat long due to thickness. I recommended to her daughter to reach out to her PCP regarding a Podiatry referral. She denies shortness of breath. Her main concern is feeling weak and tired a lot of the time. She remains able to perform ADLs independently. She is ambulatory with the use of a cane. No recent falls. She is eating 3 meals per day plus snacks. She does not have any issues with swallowing unless she eats too fast. She is sleeping well during the night and takes "cat" naps during the day. She is continent of both bowel and bladder and wears regular underwear. She says her constipation is better taking ClearLax daily. She states that her vision is worsening, but she does not want to see an Optometrist. She feels her current medication regimen is effective. Will continue to monitor.   CODE STATUS: DNR ADVANCED DIRECTIVES: Y MOST FORM: yes PPS: 50%   PHYSICAL EXAM:   VITALS: Today's Vitals   05/06/21 1317  BP: 126/69  Pulse: 68  Resp:  17  Temp: 97.7 F (36.5 C)  TempSrc: Temporal  SpO2: 97%  PainSc: 2   PainLoc: Back    LUNGS: clear to auscultation  CARDIAC: Cor RRR EXTREMITIES:  No edema SKIN:  Exposed skin is dry and intact; denies any skin issues   NEURO:  Alert and oriented x 3, pleasant mood, ambulatory w/cane   (Duration of visit and documentation 60 minutes)   Daryl Eastern, RN BSN

## 2021-05-31 ENCOUNTER — Telehealth: Payer: Self-pay

## 2021-05-31 NOTE — Telephone Encounter (Signed)
905 am.  Request received from [facility], SW for Palliative Care to follow up with daughter [name] due to symptom management needs.  Phone call made to [facility] to check on patient status.  Patient was doing much better yesterday.  Pain is well managed.  Patient is eating well.  [name] is on her way to see patient now and will notify Palliative Care if there any new concerns.

## 2021-06-19 ENCOUNTER — Other Ambulatory Visit: Payer: Self-pay | Admitting: Family Medicine

## 2021-06-19 DIAGNOSIS — E039 Hypothyroidism, unspecified: Secondary | ICD-10-CM

## 2021-06-19 NOTE — Telephone Encounter (Signed)
last RF 04/16/21 #90

## 2021-06-29 ENCOUNTER — Other Ambulatory Visit: Payer: Self-pay | Admitting: Family Medicine

## 2021-06-29 DIAGNOSIS — E039 Hypothyroidism, unspecified: Secondary | ICD-10-CM

## 2021-07-02 ENCOUNTER — Encounter: Payer: Self-pay | Admitting: Family Medicine

## 2021-07-02 ENCOUNTER — Other Ambulatory Visit: Payer: Self-pay

## 2021-07-02 ENCOUNTER — Ambulatory Visit: Payer: Medicare PPO | Admitting: Family Medicine

## 2021-07-02 VITALS — BP 130/64 | HR 81 | Temp 98.3°F | Resp 16 | Ht 61.0 in | Wt 121.0 lb

## 2021-07-02 DIAGNOSIS — E039 Hypothyroidism, unspecified: Secondary | ICD-10-CM | POA: Diagnosis not present

## 2021-07-02 DIAGNOSIS — G894 Chronic pain syndrome: Secondary | ICD-10-CM

## 2021-07-02 DIAGNOSIS — K5909 Other constipation: Secondary | ICD-10-CM

## 2021-07-02 DIAGNOSIS — I700 Atherosclerosis of aorta: Secondary | ICD-10-CM

## 2021-07-02 DIAGNOSIS — Z23 Encounter for immunization: Secondary | ICD-10-CM

## 2021-07-02 DIAGNOSIS — I10 Essential (primary) hypertension: Secondary | ICD-10-CM

## 2021-07-02 DIAGNOSIS — N2581 Secondary hyperparathyroidism of renal origin: Secondary | ICD-10-CM | POA: Diagnosis not present

## 2021-07-02 DIAGNOSIS — E441 Mild protein-calorie malnutrition: Secondary | ICD-10-CM

## 2021-07-02 DIAGNOSIS — D61818 Other pancytopenia: Secondary | ICD-10-CM

## 2021-07-02 DIAGNOSIS — K551 Chronic vascular disorders of intestine: Secondary | ICD-10-CM | POA: Diagnosis not present

## 2021-07-02 DIAGNOSIS — K8689 Other specified diseases of pancreas: Secondary | ICD-10-CM

## 2021-07-02 DIAGNOSIS — E782 Mixed hyperlipidemia: Secondary | ICD-10-CM

## 2021-07-02 DIAGNOSIS — F320 Major depressive disorder, single episode, mild: Secondary | ICD-10-CM | POA: Diagnosis not present

## 2021-07-02 DIAGNOSIS — N1831 Chronic kidney disease, stage 3a: Secondary | ICD-10-CM

## 2021-07-02 DIAGNOSIS — F09 Unspecified mental disorder due to known physiological condition: Secondary | ICD-10-CM

## 2021-07-02 NOTE — Progress Notes (Signed)
Name: Lori Pacheco   MRN: [id number]    DOB: [birth year]   Date:07/02/2021       Progress Note  Subjective  Chief Complaint  Discuss Thyroid  HPI  Patient came in today with her granddaughter Lori Pacheco (  daughter Lori Pacheco -lives next door, patient's youngest daughter - Lori Pacheco - lives with her)  , [name] and daughter are aware of her advanced age and multiple medical problems that are not managed such as CKI and also MGUS and increase in cognitive dysfunction. She is now having palliative care by [facility] visiting her once a month She is DNR - unchanged    She has been doing better since she comes in more often , no visits to the [facility]. She has been seeing family members more often. She loves having her great-grandchild - 18 yo Lori Pacheco - Lori Pacheco's son.  Also getting visits from palliative care    She used to have a lot of pruritis but doing well lately. She has hydroxyzine at home but  Only takes it prn    CKI with secondary hyperparathyroidism: not willing to see nephrologist at this time, reviewed labs with patient her last GFR iimproved from 27 to 37 , we will recheck labs today    CT abdomen done [facility] in 2015   1.  No evidence of bowel ischemia. 2.  Prominent calcified atheromatous plaque at the origin of the celiac artery and SMA resulting in mild-moderate stenosis, as above, but the distal branches remain patent. 3.  Small amount of free fluid in the pelvis of unknown etiology.    Repeat CT abdomen at [facility] 01/2020   1. Right lower lobe pulmonary embolism. This was discussed with the ordering ER physician. 2. 3.3 x 1.1 cm hypoattenuating lesion within the pancreatic head, favored to represent a sidebranch IPMN. In the setting of advanced age, no definite follow-up is necessary, however, this may be further evaluated with MRI/MRCP as clinically desired. 3. Small-volume abdominal pelvic ascites with diffuse mesenteric edema. Findings are nonspecific without identifiable cause by CT. 4. No  evidence of bowel obstruction as clinically questioned.   DVT left leg: Eliquis is not on her list, she had DVT on left leg, also history of PE. She states she will verify if taking medication. No calf tenderness or swelling.  Doing well    MDD: daughter palliative care has been visiting her monthly.She is back on sertraline.    Atherosclerosis of aorta/ she also has mesenteric artery stenosis: on eliquis, does not want any other medication such as statin therapy, advanced medical problems. She is under palliative care, she does not like taking a lot of pills. Unchanged    Pancreatic mass: found on CT abdomen, daughter and patient are aware of the findings of pancreatic mass on CT done at [facility] showed a mass, since she has a constant epigastric pain and back pain and because of advanced age, would not tolerate therapy, she states lidoderm patch helps with pain, she lost weight again, discussed high calorie diet   CT result :  PANCREAS: Lobulated hypoattenuating lesion within the pancreatic head measuring 3.3 x 1.1 cm (4:26). Top differential consideration would include a sidebranch IPMN.   Constipation: she is doing better now that she has been taking miralax daily    Hypothyroidism: she is on levothyroxine 75 mcg 5 days a week and we will recheck level today, she has dry skin, constipation has improved with miralax, weight is stable, gained 2 lbs since last  visit   Malnutrition: she is is still under wait, she cannot eat much because of abdominal bloating , she has mesenteric stenosis, discussed adding high calorie snacks.   Pancytopenia: used to go to cancer center , but not currently   Cognitive dysfunction: she wakes up telling stories about things happening but no sundowning   Patient Active Problem List   Diagnosis Date Noted   Pancytopenia ([place]) 12/15/2020   Superior mesenteric artery stenosis ([place]) 03/02/2020   Stage 3b chronic kidney disease ([place]) 03/02/2020   History of pulmonary  embolism 02/07/2020   Chronic abdominal pain 02/07/2020   Depression with anxiety 02/07/2020   Cognitive dysfunction 12/25/2018   Varicose veins of leg with pain, bilateral 12/04/2018   Deep vein thrombosis (DVT) of femoral vein of left lower extremity ([place]) 10/17/2018   Other constipation 04/23/2018   Leg cramps 04/23/2018   Secondary renal hyperparathyroidism ([place]) 05/15/2017   Atherosclerosis of abdominal aorta ([place]) 11/10/2016   Perennial allergic rhinitis with seasonal variation 11/10/2016   Vitamin D deficiency 03/29/2016   Hypothyroidism 09/18/2015   Essential hypertension 05/19/2015   Hyperlipemia 05/19/2015    Past Surgical History:  Procedure Laterality Date   NO PAST SURGERIES      Family History  Problem Relation Age of Onset   Healthy Mother    Healthy Father    Heart disease Brother    Cancer Brother    Cancer Daughter    Alcohol abuse Son    Diabetes Son    Heart disease Son     Social History   Tobacco Use   Smoking status: Never   Smokeless tobacco: Former    Types: Snuff    Quit date: 09/27/1986   Tobacco comments:    smoking cessation materials not required  Substance Use Topics   Alcohol use: No    Alcohol/week: 0.0 standard drinks     Current Outpatient Medications:    acetaminophen (TYLENOL) 500 MG tablet, Take 1 tablet (500 mg total) by mouth every 6 (six) hours as needed., Disp: 100 tablet, Rfl: 0   acetaminophen-codeine (TYLENOL #3) 300-30 MG tablet, Take 1 tablet by mouth every 4 (four) hours as needed for moderate pain., Disp: 30 tablet, Rfl: 0   amLODipine (NORVASC) 10 MG tablet, Take 1 tablet (10 mg total) by mouth daily., Disp: 90 tablet, Rfl: 1   Blood Glucose-BP Monitor (BLOOD GLUCOSE-WRIST BP MONITOR) DEVI, 1 each by Does not apply route daily., Disp: 1 each, Rfl: 0   hydrOXYzine (ATARAX/VISTARIL) 10 MG tablet, Take 1 tablet (10 mg total) by mouth at bedtime., Disp: 90 tablet, Rfl: 1   levocetirizine (XYZAL) 5 MG tablet, Take 1  tablet (5 mg total) by mouth every evening., Disp: 90 tablet, Rfl: 1   levothyroxine (SYNTHROID) 75 MCG tablet, TAKE 1 TABLET ONCE DAILY BEFORE BREAKFAST ON MONDAY THROUGH FRIDAY. SKIP DOSE ON WEEKENDS, Disp: 65 tablet, Rfl: 0   lidocaine (LIDODERM) 5 %, Place 2 patches onto the skin every morning. And remove it at night (Patient taking differently: Place 1-2 patches onto the skin daily as needed (pain). (remove after 12 hours)), Disp: 180 patch, Rfl: 0   lisinopril (ZESTRIL) 20 MG tablet, Take 1 tablet (20 mg total) by mouth daily., Disp: 90 tablet, Rfl: 1   omeprazole (PRILOSEC) 20 MG capsule, Take 1 capsule (20 mg total) by mouth daily., Disp: 30 capsule, Rfl: 3   polyethylene glycol powder (GLYCOLAX/MIRALAX) 17 GM/SCOOP powder, Take 17 g by mouth daily with breakfast., Disp: 3350  g, Rfl: 1   sertraline (ZOLOFT) 25 MG tablet, Take 1 tablet (25 mg total) by mouth daily. Happy pill, Disp: 90 tablet, Rfl: 1   Vitamin D, Ergocalciferol, (DRISDOL) 1.25 MG (50000 UNIT) CAPS capsule, TAKE 1 CAPSULE ONE TIME WEEKLY, Disp: 12 capsule, Rfl: 1  Allergies  Allergen Reactions   Lyrica [Pregabalin]     " I felt groggy"    I personally reviewed active problem list, medication list, allergies, family history, social history, health maintenance with the patient/caregiver today.   ROS  Constitutional: Negative for fever or weight change.  Respiratory: Negative for cough and shortness of breath.   Cardiovascular: Negative for chest pain or palpitations.  Gastrointestinal: Negative for abdominal pain, no bowel changes.  Musculoskeletal: positive for gait problem but no  joint swelling.  Skin: Negative for rash.  Neurological: Negative for dizziness or headache.  No other specific complaints in a complete review of systems (except as listed in HPI above).   Objective  Vitals:   07/02/21 0912  BP: 130/64  Pulse: 81  Resp: 16  Temp: 98.3 F (36.8 C)  SpO2: 98%  Weight: 121 lb (54.9 kg)  Height:  '5\' 1"'$  (1.549 m)    Body mass index is 22.86 kg/m.  Physical Exam  Constitutional: Patient appears well-developed and  malnourished. No distress.  HEENT: head atraumatic, normocephalic, pupils equal and reactive to light, neck supple Cardiovascular: Normal rate, regular rhythm and normal heart sounds.  No murmur heard. No BLE edema. Pulmonary/Chest: Effort normal and breath sounds normal. No respiratory distress. Abdominal: Soft.  There is no tenderness. Psychiatric: Patient has a normal mood and affect. behavior is normal. Judgment and thought content normal.    PHQ2/9: Depression screen [facility] 2/9 07/02/2021 04/16/2021 12/15/2020 08/12/2020 07/21/2020  Decreased Interest 0 0 '1 1 1  '$ Down, Depressed, Hopeless 1 0 '1 1 1  '$ PHQ - 2 Score 1 0 '2 2 2  '$ Altered sleeping 1 0 3 0 0  Tired, decreased energy 0 0 '3 1 1  '$ Change in appetite 0 3 0 0 0  Feeling bad or failure about yourself  1 0 3 0 0  Trouble concentrating 0 1 0 1 1  Moving slowly or fidgety/restless 0 0 0 0 0  Suicidal thoughts 0 0 0 0 0  PHQ-9 Score '3 4 11 4 4  '$ Difficult doing work/chores - Not difficult at all - Not difficult at all Not difficult at all  Some recent data might be hidden    phq 9 is positive   Fall Risk: Fall Risk  07/02/2021 04/16/2021 12/15/2020 08/12/2020 07/21/2020  Falls in the past year? 1 0 '1 1 1  '$ Comment - - - - -  Number falls in past yr: 0 0 0 1 1  Comment - - - 6 -  Injury with Fall? 0 0 0 0 0  Risk Factor Category  - - - - -  Comment - - - - -  Risk for fall due to : Impaired mobility;Impaired balance/gait - - History of fall(s);Impaired balance/gait History of fall(s);Impaired balance/gait  Risk for fall due to: Comment - - - - -  Follow up Falls prevention discussed - - - Falls prevention discussed      Functional Status Survey: Is the patient deaf or have difficulty hearing?: No Does the patient have difficulty seeing, even when wearing glasses/contacts?: No Does the patient have difficulty  concentrating, remembering, or making decisions?: No Does the patient have difficulty walking or  climbing stairs?: Yes Does the patient have difficulty dressing or bathing?: No Does the patient have difficulty doing errands alone such as visiting a doctor's office or shopping?: No    Assessment & Plan  1. Secondary renal hyperparathyroidism (HCC)  - Parathyroid hormone, intact (no Ca)  2. Pancytopenia ([place])  - CBC with Differential/Platelet  3. Essential hypertension  - COMPLETE METABOLIC PANEL WITH GFR  4. Mild major depression (HCC)   5. Need for immunization against influenza  - Flu Vaccine QUAD High Dose(Fluad)  6. Stage 3a chronic kidney disease (HCC)  - COMPLETE METABOLIC PANEL WITH GFR  7. Intermittent constipation   8. Atherosclerosis of abdominal aorta ([place])   9. Superior mesenteric artery stenosis ([place])   10. Pancreatic mass   11. Cognitive dysfunction   12. Chronic pain syndrome  Doing better  13. Mixed hyperlipidemia   14. Mild protein-calorie malnutrition ([place])   15. Acquired hypothyroidism  - TSH

## 2021-07-05 LAB — CBC WITH DIFFERENTIAL/PLATELET
Absolute Monocytes: 224 cells/uL (ref 200–950)
Basophils Absolute: 10 cells/uL (ref 0–200)
Basophils Relative: 0.4 %
Eosinophils Absolute: 52 cells/uL (ref 15–500)
Eosinophils Relative: 2 %
HCT: 36.3 % (ref 35.0–45.0)
Hemoglobin: 11.4 g/dL — ABNORMAL LOW (ref 11.7–15.5)
Lymphs Abs: 863 cells/uL (ref 850–3900)
MCH: 28.1 pg (ref 27.0–33.0)
MCHC: 31.4 g/dL — ABNORMAL LOW (ref 32.0–36.0)
MCV: 89.6 fL (ref 80.0–100.0)
MPV: 10.8 fL (ref 7.5–12.5)
Monocytes Relative: 8.6 %
Neutro Abs: 1451 cells/uL — ABNORMAL LOW (ref 1500–7800)
Neutrophils Relative %: 55.8 %
Platelets: 121 10*3/uL — ABNORMAL LOW (ref 140–400)
RBC: 4.05 10*6/uL (ref 3.80–5.10)
RDW: 13.7 % (ref 11.0–15.0)
Total Lymphocyte: 33.2 %
WBC: 2.6 10*3/uL — ABNORMAL LOW (ref 3.8–10.8)

## 2021-07-05 LAB — COMPLETE METABOLIC PANEL WITH GFR
AG Ratio: 1.6 (calc) (ref 1.0–2.5)
ALT: 6 U/L (ref 6–29)
AST: 16 U/L (ref 10–35)
Albumin: 4.2 g/dL (ref 3.6–5.1)
Alkaline phosphatase (APISO): 63 U/L (ref 37–153)
BUN/Creatinine Ratio: 17 (calc) (ref 6–22)
BUN: 21 mg/dL (ref 7–25)
CO2: 29 mmol/L (ref 20–32)
Calcium: 9.3 mg/dL (ref 8.6–10.4)
Chloride: 107 mmol/L (ref 98–110)
Creat: 1.27 mg/dL — ABNORMAL HIGH (ref 0.60–0.95)
Globulin: 2.7 g/dL (calc) (ref 1.9–3.7)
Glucose, Bld: 88 mg/dL (ref 65–99)
Potassium: 4.5 mmol/L (ref 3.5–5.3)
Sodium: 141 mmol/L (ref 135–146)
Total Bilirubin: 0.6 mg/dL (ref 0.2–1.2)
Total Protein: 6.9 g/dL (ref 6.1–8.1)
eGFR: 38 mL/min/{1.73_m2} — ABNORMAL LOW (ref >=60)

## 2021-07-05 LAB — TSH: TSH: 0.71 mIU/L (ref 0.40–4.50)

## 2021-07-05 LAB — PARATHYROID HORMONE, INTACT (NO CA): PTH: 81 pg/mL — ABNORMAL HIGH (ref 16–77)

## 2021-07-08 ENCOUNTER — Telehealth: Payer: Self-pay

## 2021-07-08 NOTE — Telephone Encounter (Signed)
Copied from [place] [phone]. Topic: General - Other >> Jul 08, 2021 12:41 PM Alanda Slim E wrote: Reason for CRM: Ms. Berenice Primas returned Wadie Mattie's call to go over lab results / please advise

## 2021-07-19 ENCOUNTER — Ambulatory Visit: Payer: Medicare PPO | Admitting: Family Medicine

## 2021-07-29 ENCOUNTER — Other Ambulatory Visit: Payer: Medicare PPO | Admitting: Nurse Practitioner

## 2021-07-29 ENCOUNTER — Other Ambulatory Visit: Payer: Self-pay

## 2021-07-29 ENCOUNTER — Encounter: Payer: Self-pay | Admitting: Nurse Practitioner

## 2021-07-29 DIAGNOSIS — Z515 Encounter for palliative care: Secondary | ICD-10-CM | POA: Diagnosis not present

## 2021-07-29 DIAGNOSIS — K5909 Other constipation: Secondary | ICD-10-CM

## 2021-07-29 DIAGNOSIS — R630 Anorexia: Secondary | ICD-10-CM

## 2021-07-29 NOTE — Progress Notes (Signed)
[facility] Consult Note Telephone: [phone]  Fax: [phone]    Date of encounter: 07/29/21 11:29 PM PATIENT NAME: Lori Pacheco [place] [address]   [phone] (home)  DOB: 09/10/1921 MRN: [id number] PRIMARY CARE PROVIDER:    Steele Sizer, MD,  [address] [place] [address] [phone]  RESPONSIBLE PARTY:    Contact Information     Name Relation Home Work [place] Daughter [phone]  [phone]   Kirstie Mirza Daughter [phone]  [phone]      I met face to face with patient and family in home. Palliative Care was asked to follow this patient by consultation request of  Steele Sizer, MD to address advance care planning and complex medical decision making. This is a follow up visit.                                  ASSESSMENT AND PLAN / RECOMMENDATIONS:  Symptom Management/Plan: 1. Advance Care Planning; DNR  2. Constipation, discussed bowel patterns, bowel regimen, we talked about using mirlax qd which has improved and regulated bm's.   3. Anorexia; continue to encourage comfort feedings. Ms. Stoltzfus does make some of her own food. Discussed nutrition with supplements, education completed.  12/15/2020 weight 126 lbs BMI 23.05 07/02/2021 Weight 121 lbs BMI 22.86 5lb weight loss/6 months Pancreatic mass: found on CT abdomen, daughter and patient are aware of the findings of pancreatic mass on CT done at [facility] showed a mass, since she has a constant epigastric pain and back pain and because of advanced age, would not tolerate therapy; CT result :  PANCREAS: Lobulated hypoattenuating lesion within the pancreatic head measuring 3.3 x 1.1 cm (4:26).   4. Goals of Care: Goals include to maximize quality of life and symptom management. Our advance care planning conversation included a discussion about:    The value and importance of advance care planning   Exploration of personal, cultural or spiritual beliefs that might influence medical decisions  Exploration of goals of care in the event of a sudden injury or illness  Identification and preparation of a healthcare agent  Review and updating or creation of an advance directive document.  5. Palliative care encounter; Palliative care encounter; Palliative medicine team will continue to support patient, patient's family, and medical team. Visit consisted of counseling and education dealing with the complex and emotionally intense issues of symptom management and palliative care in the setting of serious and potentially life-threatening illness  6. f/u 2 month for ongoing monitoring chronic disease progression, ongoing discussions complex medical decision making  Follow up Palliative Care Visit: Palliative care will continue to follow for complex medical decision making, advance care planning, and clarification of goals. Return 8 weeks or prn.  I spent 67 minutes providing this consultation. More than 50% of the time in this consultation was spent in counseling and care coordination.  PPS: 50%  Chief Complaint: Follow up palliative consult for complex medical decision making  HISTORY OF PRESENT ILLNESS:  Lori Pacheco is a 87 y.o. year old female  with multiple medical problems including monoclonal gammopathy of undetermined significance (MGUS), Chronic back pain, Depression, HLD, HTN, CKD 3, Hypothyroidism, Hx of PE on Eliquis. I called Lori Pacheco to confirm f/u pc visit and covid screening negative. I visited Ms. Housh and Lori Pacheco in Lori Pacheco home. Ms.  Pacheco was ambulating, then sat on the couch. We talked about past medical history as Lori Pacheco is new to this provider. 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 We talked about her husband who passed away at home with hospice services. We talked about Lori Pacheco's functional abilities, she is  ambulatory no recent falls. Lori Pacheco dresses, bathes herself, toilets. Lori Pacheco feeds herself with declined intermit appetite. We talked about nutrition, foods she likes to eat. We talked about Constipation, discussed bowel patterns, bowel regimen, we talked about using mirlax qd which has improved and regulated bm's. We talked about ROS, all else negative. Lori Pacheco endorses Lori Pacheco goes outside and gets her own wood to put in the wood burning stove as well as mops her floors. We talked about what Lori Pacheco enjoys doing daily, quality of life. We talked about medical goals. We talked about role pc in poc. Discussed with Lori Pacheco will monitor closely should Lori Pacheco decline further, may consider Hospice, will check for eligibility for services. We talked about f/u pc visit, scheduled. Therapeutic listening, emotional support provided. Questions answered. Lori Pacheco endorses she was very glad for pc visit.   History obtained from review of EMR, discussion with daughter, Ms Berenice Pacheco and  Ms. Habenicht.  I reviewed available labs, medications, imaging, studies and related documents from the EMR.  Records reviewed and summarized above.   ROS Full 10 system review of systems performed and negative with exception of: as per HPI.   Physical Exam: Constitutional: NAD General: frail appearing, thin elderly pleasant female EYES:  lids intact, ENMT:  oral mucous membranes moist CV: S1S2, RRR Pulmonary: LCTA, no increased work of breathing, no cough, room air Abdomen:  normo-active BS + 4 quadrants, soft and non tender, MSK: moves all extremities, ambulatory Skin: warm and dry Neuro:  no generalized weakness,  no cognitive impairment Psych: non-anxious affect, A and O x 3  Questions and concerns were addressed. The patient/family was encouraged to call with questions and/or concerns. My business card was provided. Provided general support and encouragement, no other unmet needs identified   Thank  you for the opportunity to participate in the care of Lori Pacheco.  The palliative care team will continue to follow. Please call our office at [phone] if we can be of additional assistance.   This chart was dictated using voice recognition software.  Despite best efforts to proofread,  errors can occur which can change the documentation meaning.   Orva Riles Z Saatvik Thielman, NP   COVID-19 PATIENT SCREENING TOOL Asked and negative response unless otherwise noted:   Have you had symptoms of covid, tested positive or been in contact with someone with symptoms/positive test in the past 5-10 days?  NO

## 2021-08-09 ENCOUNTER — Telehealth: Payer: Self-pay

## 2021-08-09 NOTE — Telephone Encounter (Signed)
445 pm.  Incoming call from [place].  She would like to pursue hospice support and is asking what needs to be done.  Advised that I would contact her PCP for orders and that the referral center would contact her to schedule an admission visit.  No other concerns voiced at this time.  448 pm.  Phone call made to PCP office and spoke with [place] to request hospice orders and if PCP will continue to serve as attending of record while patient is under hospice care.  Message will be given to PCP nurse.

## 2021-08-09 NOTE — Telephone Encounter (Signed)
1148 am.    Follow up call requested by [name], NP to check on effectiveness of melatonin.  Spoke with [name]-daughter who states hallucinations resolved on its own.  She did not obtain melatonin that was suggested by NP.  [name] will pick medication up this week to have on hand in the event it is needed.  No further issues or concerns voiced.  Update provided to [name], NP.

## 2021-08-09 NOTE — Telephone Encounter (Signed)
434 pm.  Spoke with [name], NP who advised that patient qualifies for hospice support.  She has requested that I follow up with [name] to see if she would like to pursue hospice.  Phone call made to [facility] to advise of hospice support.  Explained services with RN weekly visits, SW, Chaplain and hospice aide support.  After-hours and weekend on-call support.  [name] feels patient is doing okay right now and did not want to commit to services.  She would like to think more about this and will contact Palliative Care if she would like to move forward with hospice.  Notified Christin Gustler, NP of daughter's decision.

## 2021-08-09 NOTE — Telephone Encounter (Signed)
Lori Pacheco  pallitive care , called about patient being serviced by  hospice and if Dr Ancil Boozer cn continue being her  primary care Dr. Please call back

## 2021-08-11 ENCOUNTER — Telehealth: Payer: Self-pay

## 2021-08-11 NOTE — Telephone Encounter (Signed)
Signed and faxed back today 08/11/2021 with fax confirmation.

## 2021-08-11 NOTE — Telephone Encounter (Signed)
Copied from [place] [phone]. Topic: General - Other >> Aug 11, 2021 11:35 AM Pawlus, Brayton Layman A wrote: Reason for CRM: Manufacturing engineer calling to follow up to ensure Dr Ancil Boozer will sign the pts orders, please advise.

## 2021-08-13 ENCOUNTER — Telehealth: Payer: Self-pay

## 2021-08-13 NOTE — Telephone Encounter (Signed)
Copied from [place] [phone]. Topic: General - Call Back - No Documentation >> Aug 13, 2021  4:30 PM Erick Blinks wrote: Reason for CRM: Pt has been assessed for hospice, she is not yet approved for hospice. She will remain with the palliative program for now   Best contact: [phone]

## 2021-08-19 ENCOUNTER — Telehealth: Payer: Self-pay

## 2021-08-19 NOTE — Telephone Encounter (Signed)
1035 am.  Request received from [place], NP to follow up with family regarding hospice assessment and need for additional information to support admission.  Spoke with Lori Pacheco-daughter regarding a CT scan to follow up on pancreatic mass that was found in 2021.  At this time, Lori Pacheco would like to think more about this as there has not been any issues for patient.  Advised Lori Pacheco if she would like to proceed to contact Palliative Care.

## 2021-08-26 DIAGNOSIS — F324 Major depressive disorder, single episode, in partial remission: Secondary | ICD-10-CM | POA: Diagnosis not present

## 2021-08-26 DIAGNOSIS — E039 Hypothyroidism, unspecified: Secondary | ICD-10-CM | POA: Diagnosis not present

## 2021-08-26 DIAGNOSIS — R32 Unspecified urinary incontinence: Secondary | ICD-10-CM | POA: Diagnosis not present

## 2021-08-26 DIAGNOSIS — I129 Hypertensive chronic kidney disease with stage 1 through stage 4 chronic kidney disease, or unspecified chronic kidney disease: Secondary | ICD-10-CM | POA: Diagnosis not present

## 2021-08-26 DIAGNOSIS — N1832 Chronic kidney disease, stage 3b: Secondary | ICD-10-CM | POA: Diagnosis not present

## 2021-08-26 DIAGNOSIS — M1990 Unspecified osteoarthritis, unspecified site: Secondary | ICD-10-CM | POA: Diagnosis not present

## 2021-08-26 DIAGNOSIS — K08109 Complete loss of teeth, unspecified cause, unspecified class: Secondary | ICD-10-CM | POA: Diagnosis not present

## 2021-08-26 DIAGNOSIS — G3184 Mild cognitive impairment, so stated: Secondary | ICD-10-CM | POA: Diagnosis not present

## 2021-08-26 DIAGNOSIS — I951 Orthostatic hypotension: Secondary | ICD-10-CM | POA: Diagnosis not present

## 2021-09-28 ENCOUNTER — Telehealth: Payer: Self-pay | Admitting: Family Medicine

## 2021-09-28 ENCOUNTER — Ambulatory Visit (INDEPENDENT_AMBULATORY_CARE_PROVIDER_SITE_OTHER): Payer: Medicare PPO

## 2021-09-28 DIAGNOSIS — Z0000 Encounter for general adult medical examination without abnormal findings: Secondary | ICD-10-CM

## 2021-09-28 NOTE — Patient Instructions (Signed)
[name] , Thank you for taking time to come for your Medicare Wellness Visit. I appreciate your ongoing commitment to your health goals. Please review the following plan we discussed and let me know if I can assist you in the future.   Screening recommendations/referrals: Colonoscopy: no longer required Mammogram: no longer required Bone Density: no longer required Recommended yearly ophthalmology/optometry visit for glaucoma screening and checkup Recommended yearly dental visit for hygiene and checkup  Vaccinations: Influenza vaccine: done 07/02/21 Pneumococcal vaccine: done 11/14/13 Tdap vaccine: done 07/06/12 Shingles vaccine: Shingrix discussed. Please contact your pharmacy for coverage information.  Covid-19:done 12/10/19, 01/07/20  Conditions/risks identified: Recommend fall prevention in the home  Next appointment: Follow up in one year for your annual wellness visit    Preventive Care 33 Years and Older, Female Preventive care refers to lifestyle choices and visits with your health care provider that can promote health and wellness. What does preventive care include? A yearly physical exam. This is also called an annual well check. Dental exams once or twice a year. Routine eye exams. Ask your health care provider how often you should have your eyes checked. Personal lifestyle choices, including: Daily care of your teeth and gums. Regular physical activity. Eating a healthy diet. Avoiding tobacco and drug use. Limiting alcohol use. Practicing safe sex. Taking low-dose aspirin every day. Taking vitamin and mineral supplements as recommended by your health care provider. What happens during an annual well check? The services and screenings done by your health care provider during your annual well check will depend on your age, overall health, lifestyle risk factors, and family history of disease. Counseling  Your health care provider may ask you questions about  your: Alcohol use. Tobacco use. Drug use. Emotional well-being. Home and relationship well-being. Sexual activity. Eating habits. History of falls. Memory and ability to understand (cognition). Work and work Statistician. Reproductive health. Screening  You may have the following tests or measurements: Height, weight, and BMI. Blood pressure. Lipid and cholesterol levels. These may be checked every 5 years, or more frequently if you are over 20 years old. Skin check. Lung cancer screening. You may have this screening every year starting at age 21 if you have a 30-pack-year history of smoking and currently smoke or have quit within the past 15 years. Fecal occult blood test (FOBT) of the stool. You may have this test every year starting at age 14. Flexible sigmoidoscopy or colonoscopy. You may have a sigmoidoscopy every 5 years or a colonoscopy every 10 years starting at age 59. Hepatitis C blood test. Hepatitis B blood test. Sexually transmitted disease (STD) testing. Diabetes screening. This is done by checking your blood sugar (glucose) after you have not eaten for a while (fasting). You may have this done every 1-3 years. Bone density scan. This is done to screen for osteoporosis. You may have this done starting at age 61. Mammogram. This may be done every 1-2 years. Talk to your health care provider about how often you should have regular mammograms. Talk with your health care provider about your test results, treatment options, and if necessary, the need for more tests. Vaccines  Your health care provider may recommend certain vaccines, such as: Influenza vaccine. This is recommended every year. Tetanus, diphtheria, and acellular pertussis (Tdap, Td) vaccine. You may need a Td booster every 10 years. Zoster vaccine. You may need this after age 57. Pneumococcal 13-valent conjugate (PCV13) vaccine. One dose is recommended after age 41. Pneumococcal polysaccharide (PPSV23) vaccine.  One dose is recommended after age 2. Talk to your health care provider about which screenings and vaccines you need and how often you need them. This information is not intended to replace advice given to you by your health care provider. Make sure you discuss any questions you have with your health care provider. Document Released: 10/30/2015 Document Revised: 06/22/2016 Document Reviewed: 08/04/2015 Elsevier Interactive Patient Education  2017 [employer] Prevention in the Home Falls can cause injuries. They can happen to people of all ages. There are many things you can do to make your home safe and to help prevent falls. What can I do on the outside of my home? Regularly fix the edges of walkways and driveways and fix any cracks. Remove anything that might make you trip as you walk through a door, such as a raised step or threshold. Trim any bushes or trees on the path to your home. Use bright outdoor lighting. Clear any walking paths of anything that might make someone trip, such as rocks or tools. Regularly check to see if handrails are loose or broken. Make sure that both sides of any steps have handrails. Any raised decks and porches should have guardrails on the edges. Have any leaves, snow, or ice cleared regularly. Use sand or salt on walking paths during winter. Clean up any spills in your garage right away. This includes oil or grease spills. What can I do in the bathroom? Use night lights. Install grab bars by the toilet and in the tub and shower. Do not use towel bars as grab bars. Use non-skid mats or decals in the tub or shower. If you need to sit down in the shower, use a plastic, non-slip stool. Keep the floor dry. Clean up any water that spills on the floor as soon as it happens. Remove soap buildup in the tub or shower regularly. Attach bath mats securely with double-sided non-slip rug tape. Do not have throw rugs and other things on the floor that can make  you trip. What can I do in the bedroom? Use night lights. Make sure that you have a light by your bed that is easy to reach. Do not use any sheets or blankets that are too big for your bed. They should not hang down onto the floor. Have a firm chair that has side arms. You can use this for support while you get dressed. Do not have throw rugs and other things on the floor that can make you trip. What can I do in the kitchen? Clean up any spills right away. Avoid walking on wet floors. Keep items that you use a lot in easy-to-reach places. If you need to reach something above you, use a strong step stool that has a grab bar. Keep electrical cords out of the way. Do not use floor polish or wax that makes floors slippery. If you must use wax, use non-skid floor wax. Do not have throw rugs and other things on the floor that can make you trip. What can I do with my stairs? Do not leave any items on the stairs. Make sure that there are handrails on both sides of the stairs and use them. Fix handrails that are broken or loose. Make sure that handrails are as long as the stairways. Check any carpeting to make sure that it is firmly attached to the stairs. Fix any carpet that is loose or worn. Avoid having throw rugs at the top or bottom of the  stairs. If you do have throw rugs, attach them to the floor with carpet tape. Make sure that you have a light switch at the top of the stairs and the bottom of the stairs. If you do not have them, ask someone to add them for you. What else can I do to help prevent falls? Wear shoes that: Do not have high heels. Have rubber bottoms. Are comfortable and fit you well. Are closed at the toe. Do not wear sandals. If you use a stepladder: Make sure that it is fully opened. Do not climb a closed stepladder. Make sure that both sides of the stepladder are locked into place. Ask someone to hold it for you, if possible. Clearly mark and make sure that you can  see: Any grab bars or handrails. First and last steps. Where the edge of each step is. Use tools that help you move around (mobility aids) if they are needed. These include: Canes. Walkers. Scooters. Crutches. Turn on the lights when you go into a dark area. Replace any light bulbs as soon as they burn out. Set up your furniture so you have a clear path. Avoid moving your furniture around. If any of your floors are uneven, fix them. If there are any pets around you, be aware of where they are. Review your medicines with your doctor. Some medicines can make you feel dizzy. This can increase your chance of falling. Ask your doctor what other things that you can do to help prevent falls. This information is not intended to replace advice given to you by your health care provider. Make sure you discuss any questions you have with your health care provider. Document Released: 07/30/2009 Document Revised: 03/10/2016 Document Reviewed: 11/07/2014 Elsevier Interactive Patient Education  2017 [employer].

## 2021-09-28 NOTE — Progress Notes (Signed)
Subjective:   Lori Pacheco is a 87 y.o. female who presents for Medicare Annual (Subsequent) preventive examination.  Virtual Visit via Telephone Note  I connected with  Lori Pacheco on 09/28/21 at  2:10 PM EST by telephone and verified that I am speaking with the correct person using two identifiers.  Location: Patient: home Provider: [place] Persons participating in the virtual visit: patient & daughter Lori Pacheco Lori Pacheco Health Advisor   I discussed the limitations, risks, security and privacy concerns of performing an evaluation and management service by telephone and the availability of in person appointments. The patient expressed understanding and agreed to proceed.  Interactive audio and video telecommunications were attempted between this nurse and patient, however failed, due to patient having technical difficulties OR patient did not have access to video capability.  We continued and completed visit with audio only.  Some vital signs may be absent or patient reported.   Clemetine Marker, LPN   Review of Systems     Cardiac Risk Factors include: advanced age (>37men, >8 women);hypertension     Objective:    There were no vitals filed for this visit. There is no height or weight on file to calculate BMI.  Advanced Directives 09/28/2021 07/21/2020 04/17/2020 05/18/2019 12/25/2018 12/22/2017 07/21/2017  Does Patient Have a Medical Advance Directive? Yes No No No No No No  Type of Paramedic of [place];Living will - - - - - -  Does patient want to make changes to medical advance directive? - - - - - - -  Copy of [place] in Chart? Yes - validated most recent copy scanned in chart (See row information) - - - - - -  Would patient like information on creating a medical advance directive? - Yes (MAU/Ambulatory/Procedural Areas - Information given) - No - Patient declined No - Patient declined Yes (MAU/Ambulatory/Procedural Areas - Information  given) Yes (MAU/Ambulatory/Procedural Areas - Information given)    Current Medications (verified) Outpatient Encounter Medications as of 09/28/2021  Medication Sig   acetaminophen (TYLENOL) 500 MG tablet Take 1 tablet (500 mg total) by mouth every 6 (six) hours as needed.   amLODipine (NORVASC) 10 MG tablet Take 1 tablet (10 mg total) by mouth daily.   Blood Glucose-BP Monitor (BLOOD GLUCOSE-WRIST BP MONITOR) DEVI 1 each by Does not apply route daily.   hydrOXYzine (ATARAX/VISTARIL) 10 MG tablet Take 1 tablet (10 mg total) by mouth at bedtime.   levocetirizine (XYZAL) 5 MG tablet Take 1 tablet (5 mg total) by mouth every evening.   levothyroxine (SYNTHROID) 75 MCG tablet TAKE 1 TABLET ONCE DAILY BEFORE BREAKFAST ON MONDAY THROUGH FRIDAY. SKIP DOSE ON WEEKENDS   lidocaine (LIDODERM) 5 % Place 2 patches onto the skin every morning. And remove it at night (Patient taking differently: Place 1-2 patches onto the skin daily as needed (pain). (remove after 12 hours))   lisinopril (ZESTRIL) 20 MG tablet Take 1 tablet (20 mg total) by mouth daily.   polyethylene glycol powder (GLYCOLAX/MIRALAX) 17 GM/SCOOP powder Take 17 g by mouth daily with breakfast.   sertraline (ZOLOFT) 25 MG tablet Take 1 tablet (25 mg total) by mouth daily. Happy pill   Vitamin D, Ergocalciferol, (DRISDOL) 1.25 MG (50000 UNIT) CAPS capsule TAKE 1 CAPSULE ONE TIME WEEKLY   acetaminophen-codeine (TYLENOL #3) 300-30 MG tablet Take 1 tablet by mouth every 4 (four) hours as needed for moderate pain. (Patient not taking: Reported on 09/28/2021)   omeprazole (PRILOSEC) 20 MG  capsule Take 1 capsule (20 mg total) by mouth daily. (Patient not taking: Reported on 09/28/2021)   No facility-administered encounter medications on file as of 09/28/2021.    Allergies (verified) Lyrica [pregabalin]   History: Past Medical History:  Diagnosis Date   Hyperlipidemia    Hypertension    Thyroid disease    Past Surgical History:  Procedure  Laterality Date   NO PAST SURGERIES     Family History  Problem Relation Age of Onset   Healthy Mother    Healthy Father    Heart disease Brother    Cancer Brother    Cancer Daughter    Alcohol abuse Son    Diabetes Son    Heart disease Son    Social History   Socioeconomic History   Marital status: Widowed    Spouse name: Jeneen Rinks   Number of children: 9   Years of education: Not on file   Highest education level: 3rd grade  Occupational History   Occupation: Retired  Tobacco Use   Smoking status: Never   Smokeless tobacco: Former    Types: Snuff    Quit date: 09/27/1986   Tobacco comments:    smoking cessation materials not required  Vaping Use   Vaping Use: Never used  Substance and Sexual Activity   Alcohol use: No    Alcohol/week: 0.0 standard drinks   Drug use: No   Sexual activity: Not Currently  Other Topics Concern   Not on file  Social History Narrative    Pt's daughter lives with her and another daughter lives next door   Social Determinants of Health   Financial Resource Strain: Low Risk    Difficulty of Paying Living Expenses: Not hard at all  Food Insecurity: No Food Insecurity   Worried About Charity fundraiser in the Last Year: Never true   Arboriculturist in the Last Year: Never true  Transportation Needs: No Transportation Needs   Lack of Transportation (Medical): No   Lack of Transportation (Non-Medical): No  Physical Activity: Sufficiently Active   Days of Exercise per Week: 7 days   Minutes of Exercise per Session: 60 min  Stress: Stress Concern Present   Feeling of Stress : To some extent  Social Connections: Moderately Isolated   Frequency of Communication with Friends and Family: More than three times a week   Frequency of Social Gatherings with Friends and Family: More than three times a week   Attends Religious Services: More than 4 times per year   Active Member of [employer] or Organizations: No   Attends Archivist  Meetings: Never   Marital Status: Widowed    Tobacco Counseling Counseling given: Not Answered Tobacco comments: smoking cessation materials not required   Clinical Intake:  Pre-visit preparation completed: Yes  Pain : No/denies pain     Nutritional Risks: None Diabetes: No  How often do you need to have someone help you when you read instructions, pamphlets, or other written materials from your doctor or pharmacy?: 1 - Never    Interpreter Needed?: No  Information entered by :: Clemetine Marker LPN   Activities of Daily Living In your present state of health, do you have any difficulty performing the following activities: 09/28/2021 07/02/2021  Hearing? Y N  Vision? N N  Difficulty concentrating or making decisions? Y N  Walking or climbing stairs? Y Y  Dressing or bathing? N N  Doing errands, shopping? N N  Conservation officer, nature and  eating ? N -  Using the Toilet? N -  In the past six months, have you accidently leaked urine? Y -  Do you have problems with loss of bowel control? N -  Managing your Medications? Y -  Managing your Finances? Y -  Housekeeping or managing your Housekeeping? N -  Some recent data might be hidden    Patient Care Team: Steele Sizer, MD as PCP - General (Family Medicine)  Indicate any recent Medical Services you may have received from other than [facility] providers in the past year (date may be approximate).     Assessment:   This is a routine wellness examination for [place].  Hearing/Vision screen Hearing Screening - Comments:: Pt has mild hearing difficulty Vision Screening - Comments:: Past due for eye exam  Dietary issues and exercise activities discussed: Current Exercise Habits: Home exercise routine, Type of exercise: Other - see comments (home and yard work), Time (Minutes): 60, Frequency (Times/Week): 7, Weekly Exercise (Minutes/Week): 420, Intensity: Mild, Exercise limited by: orthopedic condition(s)   Goals Addressed   None     Depression Screen PHQ 2/9 Scores 09/28/2021 07/02/2021 04/16/2021 12/15/2020 08/12/2020 07/21/2020 06/08/2020  PHQ - 2 Score 1 1 0 2 2 2 2   PHQ- 9 Score 2 3 4 11 4 4 8     Fall Risk Fall Risk  09/28/2021 07/02/2021 04/16/2021 12/15/2020 08/12/2020  Falls in the past year? 1 1 0 1 1  Comment - - - - -  Number falls in past yr: 1 0 0 0 1  Comment - - - - 6  Injury with Fall? 0 0 0 0 0  Risk Factor Category  - - - - -  Comment - - - - -  Risk for fall due to : History of fall(s);Impaired balance/gait Impaired mobility;Impaired balance/gait - - History of fall(s);Impaired balance/gait  Risk for fall due to: Comment - - - - -  Follow up Falls prevention discussed Falls prevention discussed - - -    FALL RISK PREVENTION PERTAINING TO THE HOME:  Any stairs in or around the home? Yes  If so, are there any without handrails? No  Home free of loose throw rugs in walkways, pet beds, electrical cords, etc? Yes  Adequate lighting in your home to reduce risk of falls? Yes   ASSISTIVE DEVICES UTILIZED TO PREVENT FALLS:  Life alert? No  Use of a cane, walker or w/c? Yes  Grab bars in the bathroom? No  Shower chair or bench in shower? Yes  Elevated toilet seat or a handicapped toilet? No   TIMED UP AND GO:  Was the test performed? No . Telephonic visit  Cognitive Function: Cognitive status assessed by direct observation. Patient has current diagnosis of cognitive impairment.      6CIT Screen 12/25/2018 12/22/2017 12/19/2016  What Year? 4 points 4 points 4 points  What month? 0 points 0 points 0 points  What time? 0 points 0 points 3 points  Count back from 20 4 points 4 points 4 points  Months in reverse 4 points 4 points 4 points  Repeat phrase 10 points 10 points 8 points  Total Score 22 22 23     Immunizations Immunization History  Administered Date(s) Administered   Fluad Quad(high Dose 65+) 07/01/2019, 08/12/2020, 07/02/2021   Influenza, High Dose Seasonal PF 09/17/2015, 08/22/2016,  06/26/2017, 07/23/2018   Influenza-Unspecified 09/02/2014   PFIZER(Purple Top)SARS-COV-2 Vaccination 12/10/2019, 01/07/2020   Pneumococcal Conjugate-13 11/14/2013, 09/27/2016   Pneumococcal Polysaccharide-23 11/29/2011  Tdap 07/06/2012   Zoster, Live 05/29/2008    TDAP status: Up to date  Flu Vaccine status: Up to date  Pneumococcal vaccine status: Up to date  Covid-19 vaccine status: Completed vaccines  Qualifies for Shingles Vaccine? Yes   Zostavax completed No   Shingrix Completed?: No.    Education has been provided regarding the importance of this vaccine. Patient has been advised to call insurance company to determine out of pocket expense if they have not yet received this vaccine. Advised may also receive vaccine at local pharmacy or Health Dept. Verbalized acceptance and understanding.  Screening Tests Health Maintenance  Topic Date Due   Zoster Vaccines- Shingrix (1 of 2) Never done   COVID-19 Vaccine (3 - Pfizer risk series) 02/04/2020   TETANUS/TDAP  07/06/2022   Pneumonia Vaccine 56+ Years old  Completed   INFLUENZA VACCINE  Completed   DEXA SCAN  Completed   HPV VACCINES  Aged Out    Health Maintenance  Health Maintenance Due  Topic Date Due   Zoster Vaccines- Shingrix (1 of 2) Never done   COVID-19 Vaccine (3 - Pfizer risk series) 02/04/2020    Colorectal cancer screening: No longer required.   Mammogram status: No longer required due to age.  Bone density status: no longer required due to age  Lung Cancer Screening: (Low Dose CT Chest recommended if Age 42-80 years, 30 pack-year currently smoking OR have quit w/in 15years.) does not qualify.   Additional Screening:  Hepatitis C Screening: does not qualify  Vision Screening: Recommended annual ophthalmology exams for early detection of glaucoma and other disorders of the eye. Is the patient up to date with their annual eye exam?  No  Who is the provider or what is the name of the office in which  the patient attends annual eye exams? Not established.   Dental Screening: Recommended annual dental exams for proper oral hygiene  Community Resource Referral / Chronic Care Management: CRR required this visit?  No   CCM required this visit?  No      Plan:     I have personally reviewed and noted the following in the patients chart:   Medical and social history Use of alcohol, tobacco or illicit drugs  Current medications and supplements including opioid prescriptions.  Functional ability and status Nutritional status Physical activity Advanced directives List of other physicians Hospitalizations, surgeries, and ER visits in previous 12 months Vitals Screenings to include cognitive, depression, and falls Referrals and appointments  In addition, I have reviewed and discussed with patient certain preventive protocols, quality metrics, and best practice recommendations. A written personalized care plan for preventive services as well as general preventive health recommendations were provided to patient.     Clemetine Marker, LPN   [id number]   Nurse Notes: none

## 2021-09-30 NOTE — Progress Notes (Signed)
Name: Lori Pacheco   MRN: [id number]    DOB: May 12, 1921   Date:10/01/2021       Progress Note  Subjective  Chief Complaint  Follow up   HPI  Patient came in today with her granddaughter Denyse Amass (  daughter [name] -lives next door and brought her in today-  patient's youngest daughter - [name] - lives with her)  , Ms. Marcum and daughters are aware of her advanced age and multiple medical problems that are not managed such as CKI and also MGUS and increase in cognitive dysfunction. She is now having palliative care by [facility] visiting her once a month She is DNR    She has been doing better since she comes to the office more often, no visits to the [facility]. She has been seeing family members more often. She loves having her great-grandchild - 41 yo [place] - Jesse's son. She is still raking leaves , sweeping floors but at times complains of abdominal pain    She used to have a lot of pruritis but doing well lately. She has hydroxyzine at home but not using it lately    CKI with secondary hyperparathyroidism: not willing to see nephrologist at this time, reviewed labs with patient her last GFR was stable in the mid 30's, Pth still elevated.   CT abdomen done [facility] in 2015   1.  No evidence of bowel ischemia. 2.  Prominent calcified atheromatous plaque at the origin of the celiac artery and SMA resulting in mild-moderate stenosis, as above, but the distal branches remain patent. 3.  Small amount of free fluid in the pelvis of unknown etiology.    Repeat CT abdomen [facility] 01/2020   1. Right lower lobe pulmonary embolism. This was discussed with the ordering ER physician. 2. 3.3 x 1.1 cm hypoattenuating lesion within the pancreatic head, favored to represent a sidebranch IPMN. In the setting of advanced age, no definite follow-up is necessary, however, this may be further evaluated with MRI/MRCP as clinically desired. 3. Small-volume abdominal pelvic ascites with diffuse mesenteric edema. Findings are  nonspecific without identifiable cause by CT. 4. No evidence of bowel obstruction as clinically questioned.   DVT left leg: Eliquis is not on her list, she had DVT on left leg, also history of PE. She states she will verify if taking medication. No calf tenderness or swelling.  Doing well    MDD: daughter palliative care has been visiting her monthly. She was on sertraline but stopped taking on her own, she has been complaining more of flank pain ( not using lidoderm) and concerned about intermittent abdominal pain ( likely from mesenteric artery stenosis) , advised daughter to consider resuming sertraline    Atherosclerosis of aorta/ she also has mesenteric artery stenosis: she is no longer taking eliquis and refuses statin therapy, she has intermittent abdominal pain after meals , eating small amounts, weight is now stable.   Pancreatic mass: found on CT abdomen, daughter and patient are aware of the findings of pancreatic mass on CT done at [facility] showed a mass, since she has a constant epigastric pain and back pain and because of advanced age, would not tolerate therapy. Unchanged    CT result :  PANCREAS: Lobulated hypoattenuating lesion within the pancreatic head measuring 3.3 x 1.1 cm (4:26). Top differential consideration would include a sidebranch IPMN.   Constipation: she is doing better now that she has been taking miralax daily , mixed with her coffee  Hypothyroidism: she is on levothyroxine 75 mcg 5 days and last level was at goal. She still has dry skin but stable.   Malnutrition: she is is still under weight , she cannot eat much because of abdominal bloating , she has mesenteric stenosis, reviewed high calorie diet   Pancytopenia: used to go to cancer center , but not currently , last labs just showed mild thrombocythemia   Cognitive dysfunction: she wakes up telling stories about things happening but no sundowning , obsessing  about her pain/discomfort at times  Right flank  pain/neuropathy: responds to lidoderm patch but has not been using it lately   Patient Active Problem List   Diagnosis Date Noted   Pancytopenia ([place]) 12/15/2020   Superior mesenteric artery stenosis ([place]) 03/02/2020   Stage 3b chronic kidney disease ([place]) 03/02/2020   History of pulmonary embolism 02/07/2020   Chronic abdominal pain 02/07/2020   Depression with anxiety 02/07/2020   Cognitive dysfunction 12/25/2018   Varicose veins of leg with pain, bilateral 12/04/2018   Deep vein thrombosis (DVT) of femoral vein of left lower extremity ([place]) 10/17/2018   Other constipation 04/23/2018   Leg cramps 04/23/2018   Secondary renal hyperparathyroidism ([place]) 05/15/2017   Atherosclerosis of abdominal aorta ([place]) 11/10/2016   Perennial allergic rhinitis with seasonal variation 11/10/2016   Vitamin D deficiency 03/29/2016   Hypothyroidism 09/18/2015   Essential hypertension 05/19/2015   Hyperlipemia 05/19/2015    Past Surgical History:  Procedure Laterality Date   NO PAST SURGERIES      Family History  Problem Relation Age of Onset   Healthy Mother    Healthy Father    Heart disease Brother    Cancer Brother    Cancer Daughter    Alcohol abuse Son    Diabetes Son    Heart disease Son     Social History   Tobacco Use   Smoking status: Never   Smokeless tobacco: Former    Types: Snuff    Quit date: 09/27/1986   Tobacco comments:    smoking cessation materials not required  Substance Use Topics   Alcohol use: No    Alcohol/week: 0.0 standard drinks     Current Outpatient Medications:    acetaminophen (TYLENOL) 500 MG tablet, Take 1 tablet (500 mg total) by mouth every 6 (six) hours as needed., Disp: 100 tablet, Rfl: 0   Blood Glucose-BP Monitor (BLOOD GLUCOSE-WRIST BP MONITOR) DEVI, 1 each by Does not apply route daily., Disp: 1 each, Rfl: 0   hydrOXYzine (ATARAX/VISTARIL) 10 MG tablet, Take 1 tablet (10 mg total) by mouth at bedtime., Disp: 90 tablet, Rfl: 1    levocetirizine (XYZAL) 5 MG tablet, Take 1 tablet (5 mg total) by mouth every evening., Disp: 90 tablet, Rfl: 1   levothyroxine (SYNTHROID) 75 MCG tablet, TAKE 1 TABLET ONCE DAILY BEFORE BREAKFAST ON MONDAY THROUGH FRIDAY. SKIP DOSE ON WEEKENDS, Disp: 65 tablet, Rfl: 0   lidocaine (LIDODERM) 5 %, Place 2 patches onto the skin every morning. And remove it at night (Patient taking differently: Place 1-2 patches onto the skin daily as needed (pain). (remove after 12 hours)), Disp: 180 patch, Rfl: 0   lisinopril (ZESTRIL) 20 MG tablet, Take 1 tablet (20 mg total) by mouth daily., Disp: 90 tablet, Rfl: 1   omeprazole (PRILOSEC) 20 MG capsule, Take 1 capsule (20 mg total) by mouth daily., Disp: 30 capsule, Rfl: 3   polyethylene glycol powder (GLYCOLAX/MIRALAX) 17 GM/SCOOP powder, Take 17 g by mouth daily with  breakfast., Disp: 3350 g, Rfl: 1   sertraline (ZOLOFT) 25 MG tablet, Take 1 tablet (25 mg total) by mouth daily. Happy pill, Disp: 90 tablet, Rfl: 1   Vitamin D, Ergocalciferol, (DRISDOL) 1.25 MG (50000 UNIT) CAPS capsule, TAKE 1 CAPSULE ONE TIME WEEKLY, Disp: 12 capsule, Rfl: 1   amLODipine (NORVASC) 10 MG tablet, Take 1 tablet (10 mg total) by mouth daily. (Patient not taking: Reported on 10/01/2021), Disp: 90 tablet, Rfl: 1  Allergies  Allergen Reactions   Lyrica [Pregabalin]     " I felt groggy"    I personally reviewed active problem list, medication list, allergies, family history, social history, health maintenance with the patient/caregiver today.   ROS  Constitutional: Negative for fever or weight change.  Respiratory: Negative for cough and shortness of breath.   Cardiovascular: Negative for chest pain or palpitations.  Gastrointestinal: positive or abdominal pain, no bowel changes.  Musculoskeletal: Negative for gait problem or joint swelling.  Skin: Negative for rash.  Neurological: Negative for dizziness or headache.  No other specific complaints in a complete review of  systems (except as listed in HPI above).   Objective  Vitals:   10/01/21 1409  BP: 130/72  Pulse: 71  Resp: 16  Temp: 97.9 F (36.6 C)  SpO2: 99%  Weight: 125 lb (56.7 kg)  Height: 5\' 1"  (1.549 m)    Body mass index is 23.62 kg/m.  Physical Exam  Constitutional: Patient appears well-developed  Very thin  HEENT: head atraumatic, normocephalic, pupils equal and reactive to light, neck supple Cardiovascular: Normal rate, regular rhythm and normal heart sounds.  No murmur heard. No BLE edema. Pulmonary/Chest: Effort normal and breath sounds normal. No respiratory distress. Abdominal: Soft.  There is no tenderness. Psychiatric: Patient has a normal mood and affect. behavior is normal. Judgment and thought content normal.   PHQ2/9: Depression screen [facility] 2/9 10/01/2021 09/28/2021 07/02/2021 04/16/2021 12/15/2020  Decreased Interest 0 0 0 0 1  Down, Depressed, Hopeless 1 1 1  0 1  PHQ - 2 Score 1 1 1  0 2  Altered sleeping 0 0 1 0 3  Tired, decreased energy 0 0 0 0 3  Change in appetite 0 0 0 3 0  Feeling bad or failure about yourself  1 1 1  0 3  Trouble concentrating 0 0 0 1 0  Moving slowly or fidgety/restless 0 0 0 0 0  Suicidal thoughts 0 0 0 0 0  PHQ-9 Score 2 2 3 4 11   Difficult doing work/chores - Not difficult at all - Not difficult at all -  Some recent data might be hidden    phq 9 is positive   Fall Risk: Fall Risk  10/01/2021 09/28/2021 07/02/2021 04/16/2021 12/15/2020  Falls in the past year? 1 1 1  0 1  Comment - - - - -  Number falls in past yr: 1 1 0 0 0  Comment - - - - -  Injury with Fall? 0 0 0 0 0  Risk Factor Category  - - - - -  Comment - - - - -  Risk for fall due to : Impaired balance/gait History of fall(s);Impaired balance/gait Impaired mobility;Impaired balance/gait - -  Risk for fall due to: Comment - - - - -  Follow up Falls prevention discussed Falls prevention discussed Falls prevention discussed - -     Functional Status Survey: Is the patient  deaf or have difficulty hearing?: Yes Does the patient have difficulty seeing, even when  wearing glasses/contacts?: No Does the patient have difficulty concentrating, remembering, or making decisions?: Yes Does the patient have difficulty walking or climbing stairs?: Yes Does the patient have difficulty dressing or bathing?: No Does the patient have difficulty doing errands alone such as visiting a doctor's office or shopping?: Yes    Assessment & Plan  1. Stage 3a chronic kidney disease (HCC)  - lisinopril (ZESTRIL) 20 MG tablet; Take 1 tablet (20 mg total) by mouth daily.  Dispense: 90 tablet; Refill: 1  2. Secondary renal hyperparathyroidism (HCC)  - Vitamin D, Ergocalciferol, (DRISDOL) 1.25 MG (50000 UNIT) CAPS capsule; TAKE 1 CAPSULE ONE TIME WEEKLY  Dispense: 12 capsule; Refill: 1  3. Thrombocytopenia ([place])   4. Atherosclerosis of abdominal aorta ([place])   5. Benign hypertension with chronic kidney disease, stage III (HCC)  - amLODipine (NORVASC) 10 MG tablet; Take 1 tablet (10 mg total) by mouth daily.  Dispense: 90 tablet; Refill: 1 - lisinopril (ZESTRIL) 20 MG tablet; Take 1 tablet (20 mg total) by mouth daily.  Dispense: 90 tablet; Refill: 1  6. Mild protein-calorie malnutrition ([place])  Weight is finally stable  7. Mild major depression (HCC)  - sertraline (ZOLOFT) 25 MG tablet; Take 1 tablet (25 mg total) by mouth daily. Happy pill  Dispense: 90 tablet; Refill: 1  8. Superior mesenteric artery stenosis (HCC)  Not on medication by choice  9. Chronic pain syndrome   10. Pancreatic mass   11. Essential hypertension  - amLODipine (NORVASC) 10 MG tablet; Take 1 tablet (10 mg total) by mouth daily.  Dispense: 90 tablet; Refill: 1 - lisinopril (ZESTRIL) 20 MG tablet; Take 1 tablet (20 mg total) by mouth daily.  Dispense: 90 tablet; Refill: 1  12. Chronic abdominal pain   13. Acquired hypothyroidism  - levothyroxine (SYNTHROID) 75 MCG tablet; TAKE 1 TABLET  ONCE DAILY BEFORE BREAKFAST ON MONDAY THROUGH FRIDAY. SKIP DOSE ON WEEKENDS  Dispense: 65 tablet; Refill: 1  14. Palliative care patient   15. Neuropathic pain of flank, right  - lidocaine (LIDODERM) 5 %; Place 2 patches onto the skin daily. (remove after 12 hours)  Dispense: 180 patch; Refill: 1  16. History of DVT in adulthood   15. Anxiety  - sertraline (ZOLOFT) 25 MG tablet; Take 1 tablet (25 mg total) by mouth daily. Happy pill  Dispense: 90 tablet; Refill: 1  18. Gastroesophageal reflux disease without esophagitis  - omeprazole (PRILOSEC) 20 MG capsule; Take 1 capsule (20 mg total) by mouth daily.  Dispense: 90 capsule; Refill: 1  19. History of pulmonary embolism

## 2021-10-01 ENCOUNTER — Ambulatory Visit: Payer: Medicare PPO | Admitting: Family Medicine

## 2021-10-01 ENCOUNTER — Encounter: Payer: Self-pay | Admitting: Family Medicine

## 2021-10-01 ENCOUNTER — Other Ambulatory Visit: Payer: Self-pay

## 2021-10-01 VITALS — BP 130/72 | HR 71 | Temp 97.9°F | Resp 16 | Ht 61.0 in | Wt 125.0 lb

## 2021-10-01 DIAGNOSIS — E039 Hypothyroidism, unspecified: Secondary | ICD-10-CM

## 2021-10-01 DIAGNOSIS — F320 Major depressive disorder, single episode, mild: Secondary | ICD-10-CM | POA: Diagnosis not present

## 2021-10-01 DIAGNOSIS — N2581 Secondary hyperparathyroidism of renal origin: Secondary | ICD-10-CM | POA: Diagnosis not present

## 2021-10-01 DIAGNOSIS — I129 Hypertensive chronic kidney disease with stage 1 through stage 4 chronic kidney disease, or unspecified chronic kidney disease: Secondary | ICD-10-CM | POA: Diagnosis not present

## 2021-10-01 DIAGNOSIS — I700 Atherosclerosis of aorta: Secondary | ICD-10-CM

## 2021-10-01 DIAGNOSIS — K8689 Other specified diseases of pancreas: Secondary | ICD-10-CM

## 2021-10-01 DIAGNOSIS — M792 Neuralgia and neuritis, unspecified: Secondary | ICD-10-CM

## 2021-10-01 DIAGNOSIS — Z86711 Personal history of pulmonary embolism: Secondary | ICD-10-CM

## 2021-10-01 DIAGNOSIS — I10 Essential (primary) hypertension: Secondary | ICD-10-CM

## 2021-10-01 DIAGNOSIS — Z86718 Personal history of other venous thrombosis and embolism: Secondary | ICD-10-CM

## 2021-10-01 DIAGNOSIS — Z515 Encounter for palliative care: Secondary | ICD-10-CM

## 2021-10-01 DIAGNOSIS — D696 Thrombocytopenia, unspecified: Secondary | ICD-10-CM | POA: Diagnosis not present

## 2021-10-01 DIAGNOSIS — N1831 Chronic kidney disease, stage 3a: Secondary | ICD-10-CM

## 2021-10-01 DIAGNOSIS — G894 Chronic pain syndrome: Secondary | ICD-10-CM

## 2021-10-01 DIAGNOSIS — E441 Mild protein-calorie malnutrition: Secondary | ICD-10-CM

## 2021-10-01 DIAGNOSIS — F419 Anxiety disorder, unspecified: Secondary | ICD-10-CM

## 2021-10-01 DIAGNOSIS — K219 Gastro-esophageal reflux disease without esophagitis: Secondary | ICD-10-CM

## 2021-10-01 DIAGNOSIS — G8929 Other chronic pain: Secondary | ICD-10-CM

## 2021-10-01 DIAGNOSIS — K551 Chronic vascular disorders of intestine: Secondary | ICD-10-CM

## 2021-10-01 DIAGNOSIS — N183 Chronic kidney disease, stage 3 unspecified: Secondary | ICD-10-CM

## 2021-10-01 DIAGNOSIS — R109 Unspecified abdominal pain: Secondary | ICD-10-CM

## 2021-10-01 MED ORDER — AMLODIPINE BESYLATE 10 MG PO TABS: 10.0000 mg | ORAL_TABLET | Freq: Every day | ORAL | 1 refills | Status: DC

## 2021-10-01 MED ORDER — SERTRALINE HCL 25 MG PO TABS: 25.0000 mg | ORAL_TABLET | Freq: Every day | ORAL | 1 refills | Status: DC

## 2021-10-01 MED ORDER — LEVOTHYROXINE SODIUM 75 MCG PO TABS: ORAL_TABLET | ORAL | 1 refills | Status: DC

## 2021-10-01 MED ORDER — OMEPRAZOLE 20 MG PO CPDR: 20.0000 mg | DELAYED_RELEASE_CAPSULE | Freq: Every day | ORAL | 1 refills | Status: DC

## 2021-10-01 MED ORDER — LIDOCAINE 5 % EX PTCH: 2.0000 | MEDICATED_PATCH | CUTANEOUS | 1 refills | Status: DC

## 2021-10-01 MED ORDER — LISINOPRIL 20 MG PO TABS: 20.0000 mg | ORAL_TABLET | Freq: Every day | ORAL | 1 refills | Status: DC

## 2021-10-01 MED ORDER — VITAMIN D (ERGOCALCIFEROL) 1.25 MG (50000 UNIT) PO CAPS: ORAL_CAPSULE | ORAL | 1 refills | Status: DC

## 2021-11-09 ENCOUNTER — Other Ambulatory Visit: Payer: Self-pay

## 2021-11-09 ENCOUNTER — Other Ambulatory Visit: Payer: Medicare PPO

## 2021-11-09 DIAGNOSIS — Z515 Encounter for palliative care: Secondary | ICD-10-CM

## 2021-11-09 NOTE — Progress Notes (Signed)
PATIENT NAME: Lori Pacheco DOB: 04/08/1921 MRN: [id number]  PRIMARY CARE PROVIDER: Steele Sizer, MD  RESPONSIBLE PARTY:  Acct ID - Guarantor Home Phone Work Phone Relationship Acct Type  [id number] - Hofmann,[name]  Self P/F     [address], [place], [place] [address]   Due to the COVID-19 crisis, this visit was done via telemedicine from my office and it was initiated and consent by this patient and or family.  I connected with  Lori Pacheco OR PROXY on 11/09/21 by telephone and verified that I am speaking with the correct person using two identifiers.   I discussed the limitations of evaluation and management by telemedicine. The patient expressed understanding and agreed to proceed.   PLAN OF CARE and INTERVENTIONS:               1.  GOALS OF CARE/ ADVANCE CARE PLANNING:  Remain home under the care of her family.               2.  PATIENT/CAREGIVER EDUCATION:  Safety.               4. PERSONAL EMERGENCY PLAN:  Activate 911 for emergencies.                5.  DISEASE STATUS:  Connected telephonically with daughter Lori Pacheco and patient.  Limited information obtained from patient due to difficulty understanding questions.   Anorexia:  Most recent weight 125 lbs in December with PCP.  Patient and Lori Pacheco endorse a good appetite.  Daughter is bringing breakfast to the home every morning. Other daughter Lori Pacheco is present in the afternoon and providing meals to patient.  Mobility:  Patient continues to ambulate independently. No recent falls.  She states she enjoys doing yard work and being outside.  Patient is staying as active as she is able. Discussed safety with patient.  Pain:  Patient endorses discomfort periodically to her arms/legs.  Patient is not currently taking anything for pain.   Home visit to be made next month 2/10 @ 1 pm.   HISTORY OF PRESENT ILLNESS:  Lori Pacheco is a 88 y.o. year old female  with multiple medical problems including monoclonal  gammopathy of undetermined significance (MGUS), Chronic back pain, Depression, HLD, HTN, CKD 3, Hypothyroidism, Hx of PE on Eliquis. I called Lori Pacheco to confirm f/u pc visit and covid screening negative.  Patient is being followed by Palliative Care every 4-8 weeks and PRN.  CODE STATUS: DNR ADVANCED DIRECTIVES: No MOST FORM: Yes PPS: 50%         Lori Burton, RN

## 2021-11-19 ENCOUNTER — Other Ambulatory Visit: Payer: Medicare PPO

## 2021-11-26 ENCOUNTER — Other Ambulatory Visit: Payer: Self-pay

## 2021-11-26 ENCOUNTER — Other Ambulatory Visit: Payer: Medicare PPO

## 2021-11-26 VITALS — BP 150/82 | HR 65 | Temp 97.5°F | Resp 18 | Wt 125.0 lb

## 2021-11-26 DIAGNOSIS — Z515 Encounter for palliative care: Secondary | ICD-10-CM

## 2021-11-26 NOTE — Progress Notes (Signed)
PATIENT NAME: Lori Pacheco DOB: 01-21-1921 MRN: [id number]  PRIMARY CARE PROVIDER: Steele Sizer, MD  RESPONSIBLE PARTY:  Acct ID - Guarantor Home Phone Work Phone Relationship Acct Type  [id number] - Dysert,[name]  Self P/F     [address], [place], [place] [address]    PLAN OF CARE and INTERVENTIONS:               1.  GOALS OF CARE/ ADVANCE CARE PLANNING:  Remain home under the care of her daughters.               2.  PATIENT/CAREGIVER EDUCATION:  Safety               4. PERSONAL EMERGENCY PLAN:  Activate 911 for emergencies.               5.  DISEASE STATUS:  Appetite:  Patient endorses 2 meals a day.  Breakfast and dinner.  No supplements are being used per daughter [name].  Patient's last weight was 125 lbs on her last PCP visit.  Daughter does not feel there has been any weight loss.   Medication Management:  Daughter [name] endorses the use of pill box to manage medication.  No refills needed at this time.  Patient is enrolled in the [facility] program and has medications sent to her home.   Pain:  Patient endorses lower back pain on this visit.  She reports collecting wood this morning that caused this discomfort.  She will take acetaminophen on occasion and will use a lidocaine patch to her back.   Safety:  Patient endorses a fall about 2 weeks ago.   She was working outside trying to loosen up ice in a bucket when she fell.  No injuries were reported. Discussed use of a cane or walker for stability.  Follow up:  patient will be seen by PCP next month.  PC will see patient in-person in April.  Daughter instructed to call if visit is needed sooner.    HISTORY OF PRESENT ILLNESS:  Lori Pacheco is a 88 y.o. year old female  with multiple medical problems including monoclonal gammopathy of undetermined significance (MGUS), Chronic back pain, Depression, HLD, HTN, CKD 3, Hypothyroidism, Hx of PE on Eliquis. I called [name] to confirm f/u pc visit and covid screening  negative.  Patient is being followed by Palliative Care every 4-8 weeks and PRN.    CODE STATUS: DNR ADVANCED DIRECTIVES: No MOST FORM: Yes PPS: 50%   PHYSICAL EXAM:   VITALS: Today's Vitals   11/26/21 1247  BP: (!) 150/82  Pulse: 65  Resp: 18  Temp: (!) 97.5 F (36.4 C)  SpO2: 97%    LUNGS: clear to auscultation  CARDIAC: Cor RRR}  EXTREMITIES: - for edema SKIN: Skin color, texture, turgor normal. No rashes or lesions or normal  NEURO: positive for gait problems       Lorenza Burton, RN

## 2021-12-08 ENCOUNTER — Ambulatory Visit: Payer: Self-pay | Admitting: *Deleted

## 2021-12-08 NOTE — Telephone Encounter (Signed)
°  Chief Complaint: Patient's daughter is calling to see if PCP has any advise to help her mother.  Symptoms: Patient has on/off episodes on mental confusion- daughter states PCP is aware of status and she decided to call today to see if there is anything that would be helpful. Patient is aware of who she is and where she is per daughter- she has hallucinations/confusion at times- today she thought someone was trying to kill her Frequency: comes and goes- sometimes recovers quickly and sometimes takes longer Pertinent Negatives: Patient denies difficulty breathing, headache, fever, weakness Disposition: [] ED /[] Urgent Care (no appt availability in office) / [] Appointment(In office/virtual)/ []  [facility]/ [] Home Care/ [] Refused Recommended Disposition /[] [place] [facility]/ [x]  Follow-up with PCP Additional Notes: Daughter is calling for PCP advise- advised patient may need to be seen - but will send message since provider is aware of these changes and this is not new symptom   Reason for Disposition  [1] Longstanding confusion (e.g., dementia, stroke) AND [2] worsening  Answer Assessment - Initial Assessment Questions 1. LEVEL OF CONSCIOUSNESS: "How is he (she, the patient) acting right now?" (e.g., alert-oriented, confused, lethargic, stuporous, comatose)     Alert and oriented- patient states she thought someone was trying to kill her- confused 2. ONSET: "When did the confusion start?"  (minutes, hours, days)     Ongoing for a while- gets worse at times 3. PATTERN "Does this come and go, or has it been constant since it started?"  "Is it present now?"     Comes and goes 4. ALCOHOL or DRUGS: "Has he been drinking alcohol or taking any drugs?"      no 5. NARCOTIC MEDICATIONS: "Has he been receiving any narcotic medications?" (e.g., morphine, Vicodin)     no 6. CAUSE: "What do you think is causing the confusion?"      Age related 7. OTHER SYMPTOMS: "Are there any other  symptoms?" (e.g., difficulty breathing, headache, fever, weakness)     no  Protocols used: Confusion - Delirium-A-AH

## 2021-12-08 NOTE — Telephone Encounter (Signed)
Summary: Patient having issues and daughter requesting medication   Patient daughter [name] called in stated that she think there is something wrong with the patient maybe her nerves because patient will complain that there are people near her or beating up on her among other issues she complain about. Asking for a call back from nurse at  [phone]

## 2021-12-09 NOTE — Telephone Encounter (Signed)
Called and spoke to daughter and offered her an appt today for her mom. [name] states her mom seems to be better as of last night and declined the appt due to being at work. Daughter would like a call back from a CMA

## 2021-12-10 NOTE — Telephone Encounter (Signed)
Daughter notified, declined med at this time due to side effect

## 2022-01-03 NOTE — Progress Notes (Signed)
Name: Lori Pacheco   MRN: [id number]    DOB: December 02, 1920   Date:01/04/2022 ? ?     Progress Note ? ?Subjective ? ?Chief Complaint ? ?Follow Up ? ?HPI ? ?Patient came in today with her daughter [name] -lives next door and brought her in today-  patient's youngest daughter - [name] - lives with her)  , Ms. Basinski and daughters are aware of her advanced age and multiple medical problems that are not managed such as CKI and also MGUS and increase in cognitive dysfunction. She is now having palliative care by [facility] visiting her once a month She is DNR She denies any pain today however fell yesterday in her yard, walked to the backyard for her wood heater that is in the basement. She did not have her cane and fell in the backyard, she denies getting hurt, she got up on her own and told her daughter when she saw her last night.  ?  ?She has been doing better since she comes to the office more often, no visits to the [facility]. She has been seeing family members more often. She loves having her great-grandchild -  about to turn [place] son. She is still raking leaves , sweeping floors , getting wood for the heater.  ?  ?She used to have a lot of pruritis but doing well lately. She has hydroxyzine at home but not using it lately  ?  ?CKI with secondary hyperparathyroidism: not willing to see nephrologist at this time, reviewed  last labs  ? ?CT abdomen done [facility] in 2015  ? ?1.  No evidence of bowel ischemia. ?2.  Prominent calcified atheromatous plaque at the origin of the celiac artery and SMA resulting in mild-moderate stenosis, as above, but the distal branches remain patent. ?3.  Small amount of free fluid in the pelvis of unknown etiology. ?  ? ?Repeat CT abdomen at [facility] 01/2020  ? ?1. Right lower lobe pulmonary embolism. This was discussed with the ordering ER physician. ?2. 3.3 x 1.1 cm hypoattenuating lesion within the pancreatic head, favored to represent a sidebranch IPMN. In the setting of advanced age, no  definite follow-up is necessary, however, this may be further evaluated with MRI/MRCP as clinically desired. ?3. Small-volume abdominal pelvic ascites with diffuse mesenteric edema. Findings are nonspecific without identifiable cause by CT. ?4. No evidence of bowel obstruction as clinically questioned. ?  ?DVT left leg: she had DVT on left leg, also history of PE. Marland Kitchen No calf tenderness or swelling.  She is off Eliquis  ?  ?MDD: daughter palliative care has been visiting her monthly. She was on sertraline but stopped taking on her own, she also only applies lidoderm prn when pain is present.  ?  ?Atherosclerosis of aorta/ she also has mesenteric artery stenosis: she is no longer taking eliquis and refuses statin therapy, she has intermittent abdominal pain after meals , eating small amounts, weight has been stable  ? ?Pancreatic mass: found on CT abdomen, daughter and patient are aware of the findings of pancreatic mass on CT done at [facility] showed a mass, she denies any recent abdominal pain. Family agrees she would not tolerate surgery due to advance age  ?  ?CT result :  PANCREAS: Lobulated hypoattenuating lesion within the pancreatic head measuring 3.3 x 1.1 cm (4:26). Top differential consideration would include a sidebranch IPMN. ?  ?Constipation: she is doing better now that she has been taking miralax daily , mixed with her  coffee  , doing well since she has been compliant with miralax It seems like the pain has decreased with normal bowel movements  ?  ?Hypothyroidism: she is on levothyroxine 75 mcg 5 days and last level was at goal. She still has dry skin but stable. We will recheck next visit  ? ?Malnutrition: she is is still under weight , she cannot eat much because of abdominal bloating , she has mesenteric stenosis, reviewed high calorie diet . Weight is stable ? ?Pancytopenia: used to go to cancer center , but not currently , last labs just showed mild thrombocythemia  ? ?Cognitive dysfunction: she wakes  up telling stories about things happening but no sundowning , obsessing  about her pain/discomfort at times. Unchanged  ? ?Right flank pain/neuropathy: responds to lidoderm patch but she only uses it prn  ? ?Patient Active Problem List  ? Diagnosis Date Noted  ? Pancytopenia ([place]) 12/15/2020  ? Superior mesenteric artery stenosis ([place]) 03/02/2020  ? Stage 3b chronic kidney disease ([place]) 03/02/2020  ? History of pulmonary embolism 02/07/2020  ? Chronic abdominal pain 02/07/2020  ? Depression with anxiety 02/07/2020  ? Cognitive dysfunction 12/25/2018  ? Varicose veins of leg with pain, bilateral 12/04/2018  ? Deep vein thrombosis (DVT) of femoral vein of left lower extremity ([place]) 10/17/2018  ? Other constipation 04/23/2018  ? Leg cramps 04/23/2018  ? Secondary renal hyperparathyroidism ([place]) 05/15/2017  ? Atherosclerosis of abdominal aorta ([place]) 11/10/2016  ? Perennial allergic rhinitis with seasonal variation 11/10/2016  ? Vitamin D deficiency 03/29/2016  ? Hypothyroidism 09/18/2015  ? Essential hypertension 05/19/2015  ? Hyperlipemia 05/19/2015  ? ? ?Past Surgical History:  ?Procedure Laterality Date  ? NO PAST SURGERIES    ? ? ?Family History  ?Problem Relation Age of Onset  ? Healthy Mother   ? Healthy Father   ? Heart disease Brother   ? Cancer Brother   ? Cancer Daughter   ? Alcohol abuse Son   ? Diabetes Son   ? Heart disease Son   ? ? ?Social History  ? ?Tobacco Use  ? Smoking status: Never  ? Smokeless tobacco: Former  ?  Types: Snuff  ?  Quit date: 09/27/1986  ? Tobacco comments:  ?  smoking cessation materials not required  ?Substance Use Topics  ? Alcohol use: No  ?  Alcohol/week: 0.0 standard drinks  ? ? ? ?Current Outpatient Medications:  ?  acetaminophen (TYLENOL) 500 MG tablet, Take 1 tablet (500 mg total) by mouth every 6 (six) hours as needed., Disp: 100 tablet, Rfl: 0 ?  amLODipine (NORVASC) 10 MG tablet, Take 1 tablet (10 mg total) by mouth daily., Disp: 90 tablet, Rfl: 1 ?  Blood Glucose-BP  Monitor (BLOOD GLUCOSE-WRIST BP MONITOR) DEVI, 1 each by Does not apply route daily., Disp: 1 each, Rfl: 0 ?  hydrOXYzine (ATARAX/VISTARIL) 10 MG tablet, Take 1 tablet (10 mg total) by mouth at bedtime., Disp: 90 tablet, Rfl: 1 ?  levothyroxine (SYNTHROID) 75 MCG tablet, TAKE 1 TABLET ONCE DAILY BEFORE BREAKFAST ON MONDAY THROUGH FRIDAY. SKIP DOSE ON WEEKENDS, Disp: 65 tablet, Rfl: 1 ?  lidocaine (LIDODERM) 5 %, Place 2 patches onto the skin daily. (remove after 12 hours), Disp: 180 patch, Rfl: 1 ?  lisinopril (ZESTRIL) 20 MG tablet, Take 1 tablet (20 mg total) by mouth daily., Disp: 90 tablet, Rfl: 1 ?  omeprazole (PRILOSEC) 20 MG capsule, Take 1 capsule (20 mg total) by mouth daily., Disp: 90 capsule, Rfl: 1 ?  polyethylene glycol powder (GLYCOLAX/MIRALAX) 17 GM/SCOOP powder, Take 17 g by mouth daily with breakfast., Disp: 3350 g, Rfl: 1 ?  sertraline (ZOLOFT) 25 MG tablet, Take 1 tablet (25 mg total) by mouth daily. Happy pill, Disp: 90 tablet, Rfl: 1 ?  Vitamin D, Ergocalciferol, (DRISDOL) 1.25 MG (50000 UNIT) CAPS capsule, TAKE 1 CAPSULE ONE TIME WEEKLY, Disp: 12 capsule, Rfl: 1 ?  levocetirizine (XYZAL) 5 MG tablet, Take 1 tablet (5 mg total) by mouth every evening. (Patient not taking: Reported on 01/04/2022), Disp: 90 tablet, Rfl: 1 ? ?Allergies  ?Allergen Reactions  ? Lyrica [Pregabalin]   ?  " I felt groggy"  ? ? ?I personally reviewed active problem list, medication list, allergies, family history, social history, health maintenance with the patient/caregiver today. ? ? ?ROS ? ?Ten systems reviewed and is negative except as mentioned in HPI  ? ?Objective ? ?Vitals:  ? 01/04/22 1525  ?BP: 122/70  ?Pulse: 88  ?Resp: 16  ?SpO2: 98%  ?Weight: 125 lb (56.7 kg)  ? ? ?Body mass index is 23.62 kg/m?. ? ?Physical Exam ? ?Constitutional: Patient appears well-developed and malnourished  No distress.  ?HEENT: head atraumatic, normocephalic, pupils equal and reactive to light, neck supple ?Cardiovascular: Normal rate,  regular rhythm and normal heart sounds.  No murmur heard. No BLE edema. ?Pulmonary/Chest: Effort normal and breath sounds normal. No respiratory distress. ?Abdominal: Soft.  There is no tenderness. ?Muscular ske

## 2022-01-04 ENCOUNTER — Encounter: Payer: Self-pay | Admitting: Family Medicine

## 2022-01-04 ENCOUNTER — Ambulatory Visit: Payer: Medicare PPO | Admitting: Family Medicine

## 2022-01-04 VITALS — BP 122/70 | HR 88 | Resp 16 | Wt 125.0 lb

## 2022-01-04 DIAGNOSIS — K551 Chronic vascular disorders of intestine: Secondary | ICD-10-CM | POA: Diagnosis not present

## 2022-01-04 DIAGNOSIS — I700 Atherosclerosis of aorta: Secondary | ICD-10-CM

## 2022-01-04 DIAGNOSIS — N1831 Chronic kidney disease, stage 3a: Secondary | ICD-10-CM | POA: Diagnosis not present

## 2022-01-04 DIAGNOSIS — D61818 Other pancytopenia: Secondary | ICD-10-CM

## 2022-01-04 DIAGNOSIS — Z515 Encounter for palliative care: Secondary | ICD-10-CM

## 2022-01-04 DIAGNOSIS — R109 Unspecified abdominal pain: Secondary | ICD-10-CM

## 2022-01-04 DIAGNOSIS — F320 Major depressive disorder, single episode, mild: Secondary | ICD-10-CM

## 2022-01-04 DIAGNOSIS — E441 Mild protein-calorie malnutrition: Secondary | ICD-10-CM

## 2022-01-04 DIAGNOSIS — Z9181 History of falling: Secondary | ICD-10-CM

## 2022-01-04 DIAGNOSIS — M792 Neuralgia and neuritis, unspecified: Secondary | ICD-10-CM

## 2022-01-04 DIAGNOSIS — E039 Hypothyroidism, unspecified: Secondary | ICD-10-CM

## 2022-01-04 DIAGNOSIS — G8929 Other chronic pain: Secondary | ICD-10-CM

## 2022-01-04 DIAGNOSIS — K8689 Other specified diseases of pancreas: Secondary | ICD-10-CM | POA: Diagnosis not present

## 2022-01-04 DIAGNOSIS — Z23 Encounter for immunization: Secondary | ICD-10-CM

## 2022-01-11 ENCOUNTER — Other Ambulatory Visit: Payer: Medicare PPO | Admitting: Nurse Practitioner

## 2022-01-11 ENCOUNTER — Other Ambulatory Visit: Payer: Self-pay

## 2022-01-11 ENCOUNTER — Encounter: Payer: Self-pay | Admitting: Nurse Practitioner

## 2022-01-11 VITALS — BP 124/80 | HR 66 | Temp 97.4°F | Resp 18 | Wt 125.0 lb

## 2022-01-11 DIAGNOSIS — F419 Anxiety disorder, unspecified: Secondary | ICD-10-CM

## 2022-01-11 DIAGNOSIS — R5381 Other malaise: Secondary | ICD-10-CM

## 2022-01-11 DIAGNOSIS — F02818 Dementia in other diseases classified elsewhere, unspecified severity, with other behavioral disturbance: Secondary | ICD-10-CM

## 2022-01-11 DIAGNOSIS — Z515 Encounter for palliative care: Secondary | ICD-10-CM

## 2022-01-11 NOTE — Progress Notes (Signed)
? ? ?Manufacturing engineer ?Community Palliative Care Consult Note ?Telephone: [phone]  ?Fax: [phone]  ? ? ?Date of encounter: 01/11/22 ?1:17 PM ?PATIENT NAME: Lori Pacheco ?[place] [address]   ?[phone] (home)  ?DOB: 23-May-1921 ?MRN: [id number] ?PRIMARY CARE PROVIDER:    ?Steele Sizer, MD,  ?[address] ?[place] [address] ?[phone] ? ?RESPONSIBLE PARTY:    ?Contact Information   ? ? Name Relation Home Work Mobile  ? [name] Daughter [phone]  [phone]  ? Gravel,Mable Daughter [phone]  [phone]  ? ?  ? ?Due to the COVID-19 crisis, this visit was done via telemedicine from my office and it was initiated and consent by this patient and or family. ? ?I connected with Vance Gather RN PC with [name] daughter and JADIA CAPERS OR PROXY on 01/11/22 by telephone and verified that I am speaking with the correct person using two identifiers. ?  ?I discussed the limitations of evaluation and management by telemedicine. The patient expressed understanding and agreed to proceed.  ?Therapist, sports. Palliative Care was asked to follow this patient by consultation request of  Steele Sizer, MD to address advance care planning and complex medical decision making. This is a follow up visit ?ASSESSMENT AND PLAN / RECOMMENDATIONS:  ? ?Advance Care Planning/Goals of Care: Goals include to maximize quality of life and symptom management. Patient/health care surrogate gave his/her permission to discuss. ?Our advance care planning conversation included a discussion about:    ?The value and importance of advance care planning   ?Identification of a healthcare agent-[name] ?CODE STATUS:  DNR ? ?Symptom Management/Plan: ?ACP: DNR ? ?2. Anxiety secondary to dementia; increase in symptoms in afternoon; continue to monitor mood. Discussed and will try celexa 10mg , [name] in agreement.  ? ?Rx: Celexa 10mg  qd; #30 No RF; escribed to [facility]  ?Will have PC RN f/u in 2  weeks, monitor closely then [facility] NP in 4 weeks. ? ?Ms. Santibanez and [name] endorse anxiety more noticeable in the early evenings.  Ms. Kaufman and daughter are requesting assistance in managing this symptom.  Currently has hydroxine for anxiety but daughter feels this is not effective.  Reviewed medication profile and [name] confirms patient is not taking Zoloft any longer.  Will try a low does of Celexa and follow up in 1 week on effectiveness of medication.  ? ?3.Debility R/T Dementia: Patient sustained 3 falls without injury last week.  Daughter [name] endorses patient is falling frequently.  Patient has cane but does not use this consistently.  Discussed use of walker but declines.  Re-enforced safety and use of cane with ambulation.  ? ?4. Constipation:   Now resolved.  [name]-daughter endorses daily use of miralax mixed with coffee and no further issues with constipation are reported.  ? ?Follow up Palliative Care Visit: Palliative care will continue to follow for complex medical decision making, advance care planning, and clarification of goals. Return 2 weeks for [facility] RN; then [facility] NP 4 weeks or prn. ? ?I spent 41 minutes providing this consultation. More than 50% of the time in this consultation was spent in counseling and care coordination. ?PPS: 50% ?Chief Complaint: Follow up palliative consult for complex medical decision making ? ?HISTORY OF PRESENT ILLNESS:  Lori Pacheco is a 88 y.o. year old female  with multiple medical problems including monoclonal gammopathy of undetermined significance (MGUS), Chronic back pain, Depression, HLD, HTN, CKD 3, Hypothyroidism, Hx of PE on Eliquis. I connected with Almyra Free  Owens Shark RN PC with [name], Ms. Stuck and Ms. Mcphie. We talked about how Ms. Killion has been feeling, functional and cognitive changes. We talked about ros, symptoms, appetite, weight, falls, behaviors with some confusion intermit likely sun-downers. We talked about option of trying celexa for anxiety. Ms. Styles  was no longer taking zoloft. [name] was in agreement for trying. We talked about medical goals, PC with f/u visits scheduled. Emotional support provided, Questions answered.  ? ?History obtained from review of EMR, discussion with primary team, and interview with family, facility staff/caregiver and/or Ms. Buttermore.  ?I reviewed available labs, medications, imaging, studies and related documents from the EMR.  Records reviewed and summarized above.  ? ?ROS ?General: NAD ?EYES: denies vision changes ?ENMT: denies dysphagia ?Cardiovascular: denies chest pain, denies DOE ?Pulmonary: denies cough, denies increased SOB ?Abdomen: endorses good appetite, denies constipation, endorses continence of bowel ?GU: denies dysuria, endorses continence of urine ?MSK:  denies increased weakness,  no falls reported ?Skin: denies rashes or wounds ?Neurological: +  pain to right lower back occasionally, denies insomnia ?Psych: Endorses positive mood ?Heme/lymph/immuno: denies bruises, abnormal bleeding ? ?Physical Exam: by Vance Gather RN PC ?Current: 125 lbs 01/04/22 ?Constitutional: NAD ?General: frail appearing, thin  ?EYES: anicteric sclera, lids intact, no discharge  ?ENMT: intact hearing, oral mucous membranes moist, dentition intact ?CV: S1S2, RRR, no LE edema ?Pulmonary: LCTA, no increased work of breathing, no cough, room air ?Abdomen: intake 50-75%, normo-active BS + 4 quadrants, soft and non tender, no ascites ?GU: deferred ?MSK: + sarcopenia, moves all extremities, ambulatory ?Skin: warm and dry, no rashes or wounds on visible skin ?Neuro:  no generalized weakness,  + cognitive impairment ?Psych: non-anxious affect, A and O x 2 ?Hem/lymph/immuno: no widespread bruising ? ?Thank you for the opportunity to participate in the care of Lori Pacheco.  The palliative care team will continue to follow. Please call our office at [phone] if we can be of additional assistance.  ? ?Lorenza Burton, RN  ? ?COVID-19 PATIENT SCREENING  TOOL ?Asked and negative response unless otherwise noted:  ? ?Have you had symptoms of covid, tested positive or been in contact with someone with symptoms/positive test in the past 5-10 days?  No ?

## 2022-01-18 ENCOUNTER — Other Ambulatory Visit: Payer: Medicare PPO

## 2022-01-18 VITALS — BP 142/78 | HR 67 | Temp 97.5°F

## 2022-01-18 DIAGNOSIS — Z515 Encounter for palliative care: Secondary | ICD-10-CM

## 2022-01-18 NOTE — Progress Notes (Signed)
PATIENT NAME: Lori Pacheco ?DOB: [birth year] ?MRN: [id number] ? ?PRIMARY CARE PROVIDER: Steele Sizer, MD ? ?RESPONSIBLE PARTY:  ?Acct ID - Guarantor Home Phone Work Phone Relationship Acct Type  ?[id number] - Lori Pacheco* [phone]  Self P/F  ?   [address], [place], [place] [address]  ? ? ?Follow up visit completed to assess for effectiveness and side effects of celexa that was ordered last week.  Patient and daughter Lori Pacheco are present.  Lori Pacheco states this was not started.  Daughter Lori Pacheco was concerned about the side effects and elected not to start.  Patient states she is doing well over all.  No further falls.  Ongoing education provided on safety and use of assistive devices for ambulation.  Patient feels her anxiety level has been low over the last week.  Has not required any PRN medications for anxiety.  Back pain has been better this week and continues with lidocaine patches.  Lori Pacheco and patient deny any new concerns at this time.  ? ?Upon leaving visit, daughter Lori Pacheco arrived.  She confirmed patient has not started the celexa.  She voiced her concerns over the side effects and specifically suicide that was noted on pharmacy literature.  Lori Pacheco advised patient has had hallucinations in the past and thought someone was going to kill her.  Lori Pacheco stated she felt it was best not to start this medication as she did not want to risk patient having any of the side effects.  Advised that I would update Lori Gusler, NP and would call back later this month to schedule our next visit.   No other concerns voiced at this time. ? ?Update provided to Lori Leatherwood, NP.  ? ?HISTORY OF PRESENT ILLNESS:  Lori Pacheco is a 88 y.o. year old female  with multiple medical problems including monoclonal gammopathy of undetermined significance (MGUS), Chronic back pain, Depression, HLD, HTN, CKD 3, Hypothyroidism, Hx of PE on Eliquis ? ?CODE STATUS: DNR ?ADVANCED DIRECTIVES: No ?MOST FORM: Yes ?PPS:  50% ? ? ?PHYSICAL EXAM:  ? ?VITALS: ?Today's Vitals  ? 01/18/22 1752  ?BP: (!) 142/78  ?Pulse: 67  ?Temp: (!) 97.5 ?F (36.4 ?C)  ?SpO2: 97%  ?  ?LUNGS: clear to auscultation  ?CARDIAC: Cor RRR}  ?EXTREMITIES: - for edema ?SKIN: Skin color, texture, turgor normal. No rashes or lesions or normal  ?NEURO: positive for memory problems ? ? ? ? ? ? ?Lorenza Burton, RN ? ?

## 2022-02-10 ENCOUNTER — Encounter: Payer: Self-pay | Admitting: Nurse Practitioner

## 2022-02-10 ENCOUNTER — Other Ambulatory Visit: Payer: Medicare PPO | Admitting: Nurse Practitioner

## 2022-02-10 DIAGNOSIS — F02818 Dementia in other diseases classified elsewhere, unspecified severity, with other behavioral disturbance: Secondary | ICD-10-CM

## 2022-02-10 DIAGNOSIS — F419 Anxiety disorder, unspecified: Secondary | ICD-10-CM

## 2022-02-10 DIAGNOSIS — Z515 Encounter for palliative care: Secondary | ICD-10-CM | POA: Diagnosis not present

## 2022-02-10 DIAGNOSIS — R5381 Other malaise: Secondary | ICD-10-CM

## 2022-02-10 NOTE — Progress Notes (Signed)
? ? ?Manufacturing engineer ?Community Palliative Care Consult Note ?Telephone: [phone]  ?Fax: [phone]  ? ? ?Date of encounter: 02/10/22 ?2:43 PM ?PATIENT NAME: Lori Pacheco ?[place] [id number]   ?[id number] (home)  ?DOB: [birth year] ?MRN: [id number] ?PRIMARY CARE PROVIDER:    ?Steele Sizer, MD,  ?[address] ?[place] [address] ?[phone] ? ?REFERRING PROVIDER:   ?Steele Sizer, MD ?[address] ?[address] ?[place],  [place] [address] ?[phone] ? ?RESPONSIBLE PARTY:    ?Contact Information   ? ? Name Relation Home Work Mobile  ? Lori Pacheco Pacheco [phone]  [phone]  ? Pacheco,Lori Pacheco [phone]  [phone]  ? ?  ? ? ?Due to the COVID-19 crisis, this visit was done via telemedicine from my office and it was initiated and consent by this patient and or family. ? ?I connected with  Lori Pacheco OR PROXY on 02/10/22 by a telephone as video not available enabled telemedicine application and verified that I am speaking with the correct person using two identifiers. ?  ?I discussed the limitations of evaluation and management by telemedicine. The patient expressed understanding and agreed to proceed. Palliative Care was asked to follow this patient by consultation request of  Steele Sizer, MD to address advance care planning and complex medical decision making. This is a follow up visit.                                  ?ASSESSMENT AND PLAN / RECOMMENDATIONS:  ?Symptom Management/Plan: ?ACP: DNR ?  ?2. Anxiety secondary to dementia; increase in symptoms in afternoon; continue to monitor mood. Improved, continued celexa. ?  ?3.Debility R/T Dementia: no recent falls reported;  Discussed use of walker but declines. Re-enforced safety and use of cane with ambulation.  ?  ?Follow up Palliative Care Visit: Palliative care will continue to follow for complex medical decision making, advance care planning, and clarification of goals.  Return 8 weeks or prn. ? ?I spent 23 minutes providing this consultation. More than 50% of the time in this consultation was spent in counseling and care coordination. ? ?PPS: 50% ? ?Chief Complaint: Follow up palliative consult for complex medical decision making ? ?HISTORY OF PRESENT ILLNESS:  Lori Pacheco is a 88 y.o. year old female  with multiple medical problems including monoclonal gammopathy of undetermined significance (MGUS), Chronic back pain, Depression, HLD, HTN, CKD 3, Hypothyroidism, Hx of PE on Eliquis. I called Lori Berenice Pacheco, Lori Pacheco with Lori Pacheco by phone as video not available for f/u telemedicine visit. We talked about how Lori Pacheco has been feeling, functional and cognitive changes. We talked about ros, symptoms, appetite, weight, no recent falls, wounds, hospitalizations, infections. Lori Pacheco is still going outside to chop wood.  We talked about anxiety has seemed to improve. We talked about chronic disease progression dementia, medical goals, role pc in poc.  PC with f/u visits scheduled. Emotional support provided, Questions answered.  .  ? ?History obtained from review of EMR, discussion with primary team, and interview with daughter, Lori Berenice Pacheco with Lori Pacheco.  ?I reviewed available labs, medications, imaging, studies and related documents from the EMR.  Records reviewed and summarized above.  ? ?ROS ?10 point system reviewed all negative except HPI with Lori Earle Pacheco dn Pacheco Lori Pacheco.  ? ?Physical Exam:deferred ?Thank you for the opportunity to participate in the care of Lori Pacheco.  The palliative care team will continue to follow. Please call our office at [phone] if we can be of additional assistance.  ? ?Dianne Whelchel Z Arick Mareno, NP  ?   ?

## 2022-03-23 ENCOUNTER — Emergency Department: Payer: Medicare PPO

## 2022-03-23 ENCOUNTER — Emergency Department
Admission: EM | Admit: 2022-03-23 | Discharge: 2022-03-23 | Disposition: A | Discharge status: Home / Self Care | Payer: Medicare PPO | Attending: Emergency Medicine | Admitting: Emergency Medicine

## 2022-03-23 ENCOUNTER — Encounter: Payer: Self-pay | Admitting: Emergency Medicine

## 2022-03-23 ENCOUNTER — Other Ambulatory Visit: Payer: Self-pay

## 2022-03-23 DIAGNOSIS — M25551 Pain in right hip: Secondary | ICD-10-CM

## 2022-03-23 DIAGNOSIS — G4489 Other headache syndrome: Secondary | ICD-10-CM | POA: Diagnosis not present

## 2022-03-23 DIAGNOSIS — S0990XA Unspecified injury of head, initial encounter: Secondary | ICD-10-CM

## 2022-03-23 DIAGNOSIS — I129 Hypertensive chronic kidney disease with stage 1 through stage 4 chronic kidney disease, or unspecified chronic kidney disease: Secondary | ICD-10-CM | POA: Insufficient documentation

## 2022-03-23 DIAGNOSIS — N1832 Chronic kidney disease, stage 3b: Secondary | ICD-10-CM | POA: Insufficient documentation

## 2022-03-23 DIAGNOSIS — M50322 Other cervical disc degeneration at C5-C6 level: Secondary | ICD-10-CM | POA: Diagnosis not present

## 2022-03-23 DIAGNOSIS — M79604 Pain in right leg: Secondary | ICD-10-CM | POA: Diagnosis not present

## 2022-03-23 DIAGNOSIS — E039 Hypothyroidism, unspecified: Secondary | ICD-10-CM | POA: Diagnosis not present

## 2022-03-23 DIAGNOSIS — R102 Pelvic and perineal pain: Secondary | ICD-10-CM | POA: Diagnosis not present

## 2022-03-23 DIAGNOSIS — M50323 Other cervical disc degeneration at C6-C7 level: Secondary | ICD-10-CM | POA: Diagnosis not present

## 2022-03-23 DIAGNOSIS — W19XXXA Unspecified fall, initial encounter: Secondary | ICD-10-CM

## 2022-03-23 DIAGNOSIS — Z043 Encounter for examination and observation following other accident: Secondary | ICD-10-CM | POA: Diagnosis not present

## 2022-03-23 DIAGNOSIS — R519 Headache, unspecified: Secondary | ICD-10-CM | POA: Diagnosis not present

## 2022-03-23 IMAGING — CT CT HEAD W/O CM
4 series · 16 of 47 positions shown, 18 images · non-contrast
Comparison: None Available.

CLINICAL DATA: Headache and found on floor.



[Series 2: head wo · axial · 0.42mm/px · z∈[-95,+20]mm · 7 of 31 slices shown, 9 images]
[im 4/31  brain]
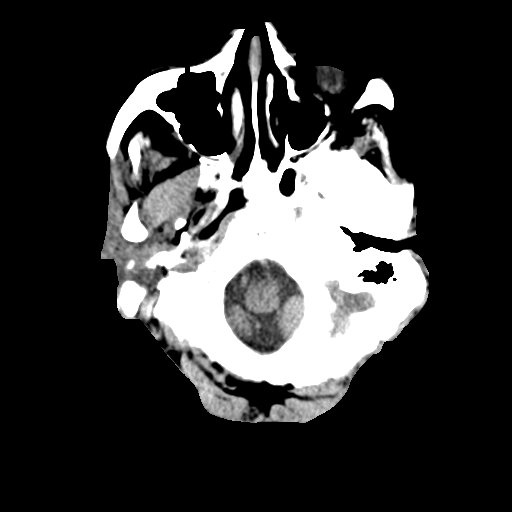
[im 4/31  bone]
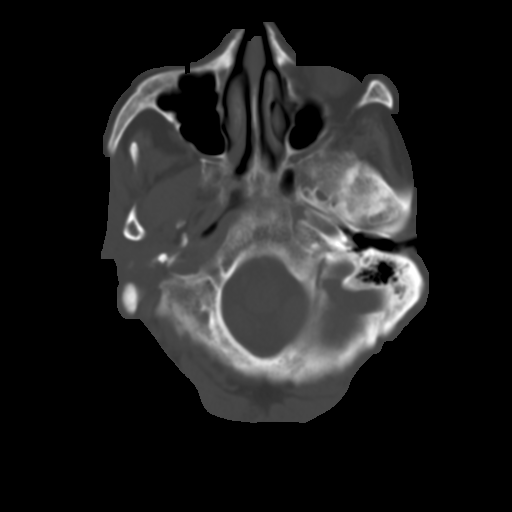
[im 8/31  brain]
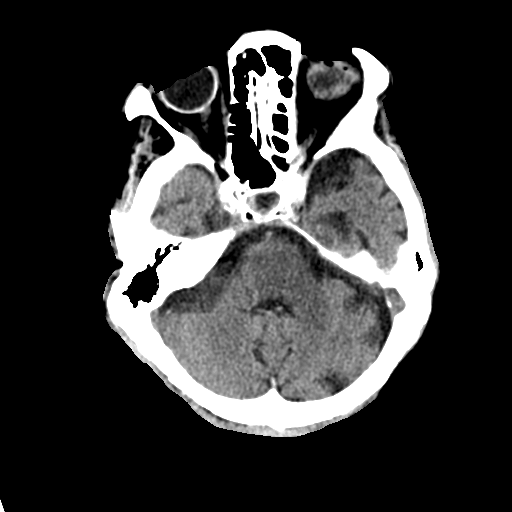
[im 12/31  brain]
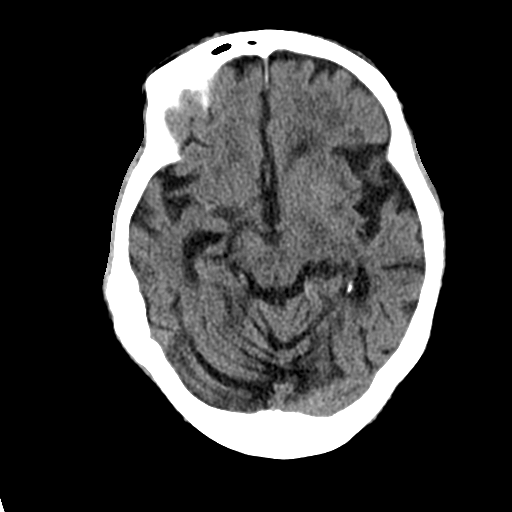
[im 16/31  brain]
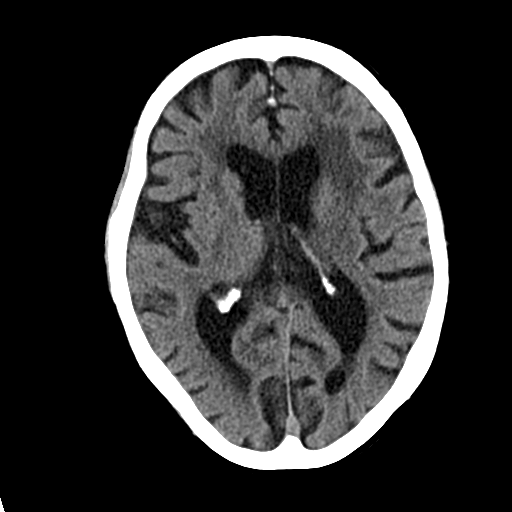
[im 19/31  brain]
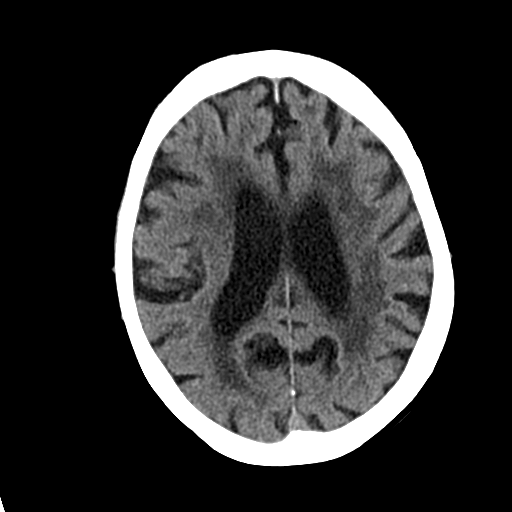
[im 19/31  bone]
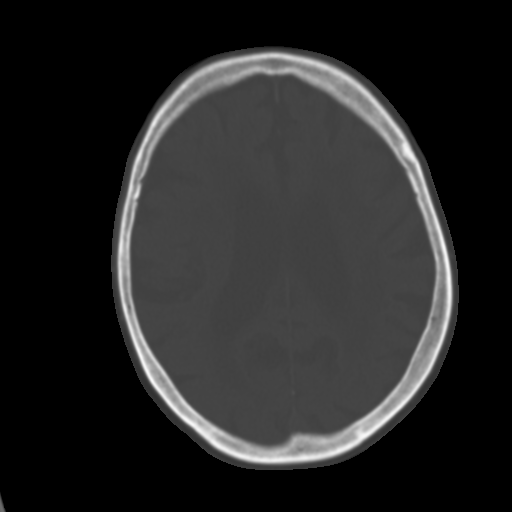
[im 23/31  brain]
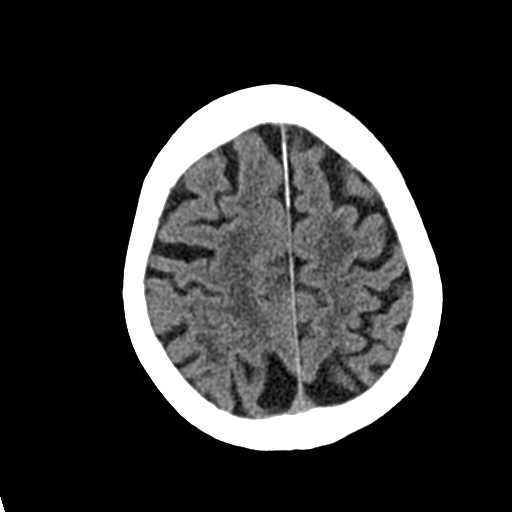
[im 27/31  brain]
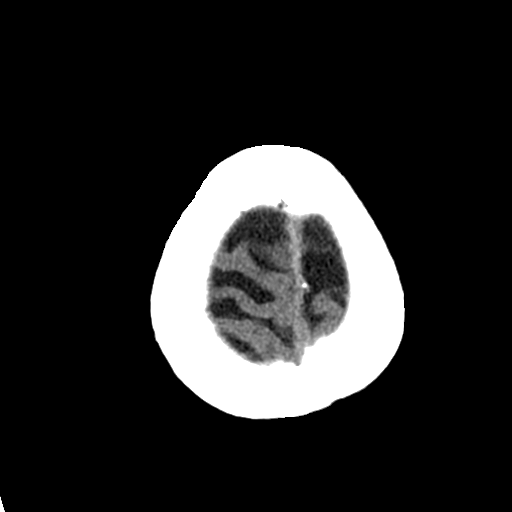

[Series 3: head bone · axial · 0.42mm/px · z∈[-96,-64]mm · 3 of 78 slices shown]
[im 8/78  bone]
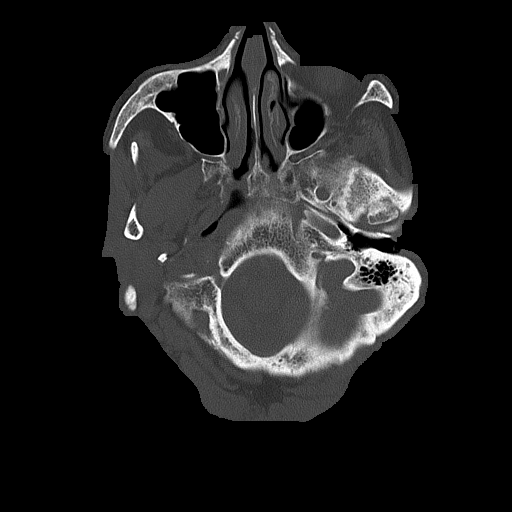
[im 16/78  bone]
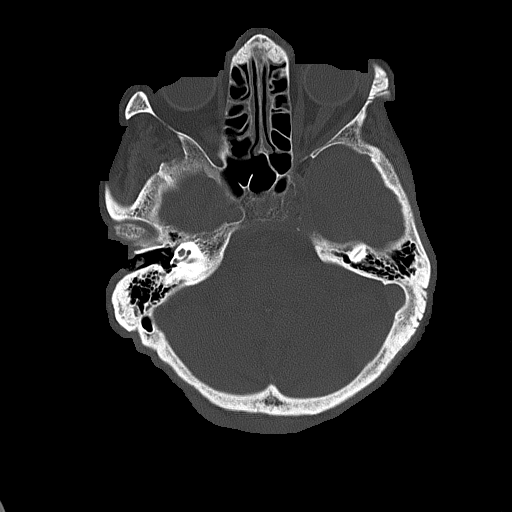
[im 24/78  bone]
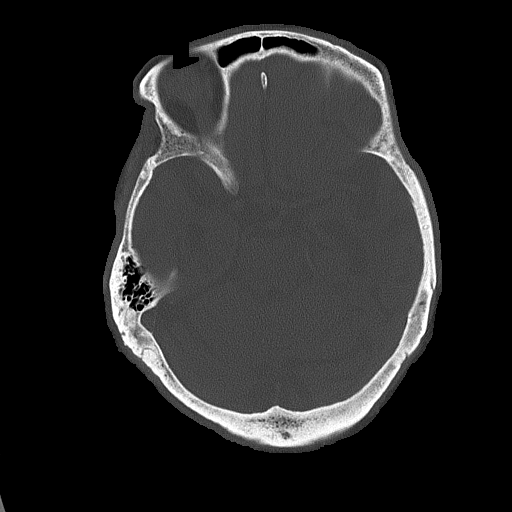

[Series 4: coronal soft tissue · coronal · 0.32mm/px · 3 of 63 slices shown]
[im 21/63  brain]
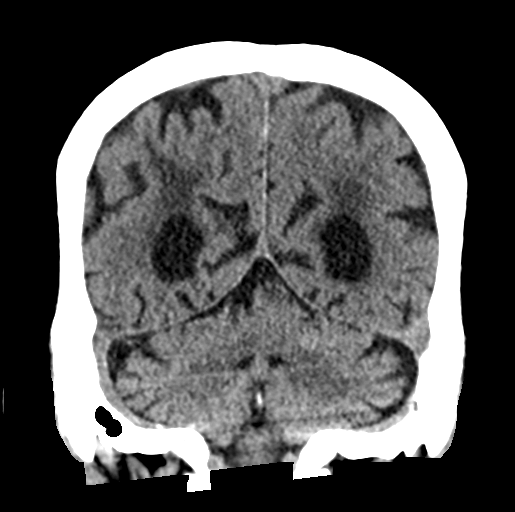
[im 28/63  brain]
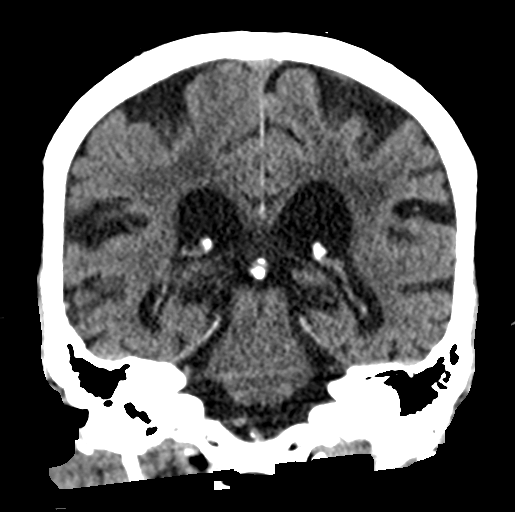
[im 35/63  brain]
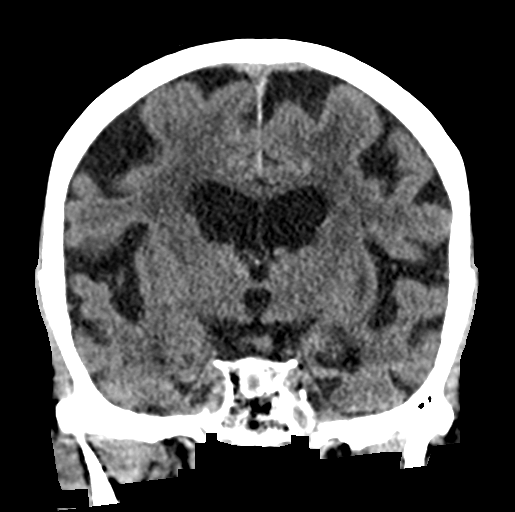

[Series 5: sagittal soft tissue · sagittal · 0.33mm/px · 3 of 50 slices shown]
[im 17/50  brain]
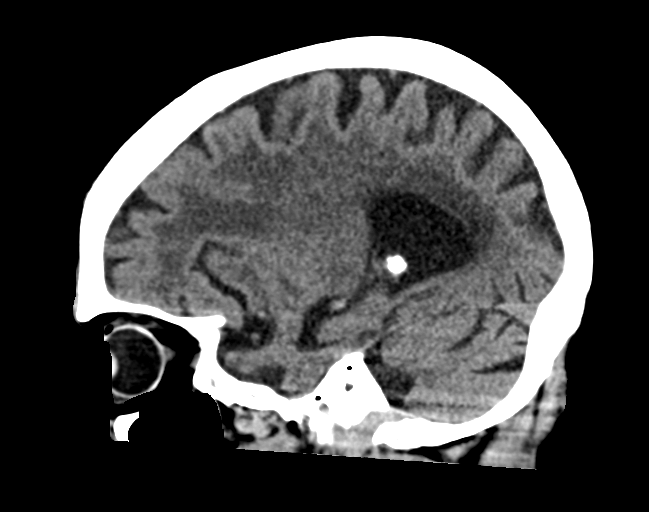
[im 25/50  brain]
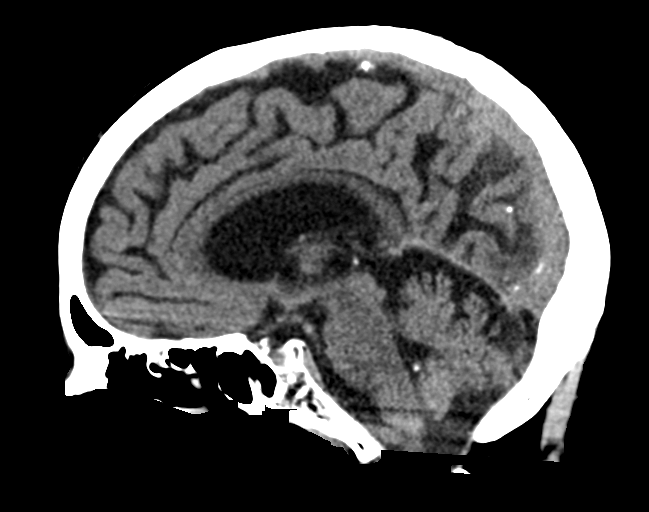
[im 33/50  brain]
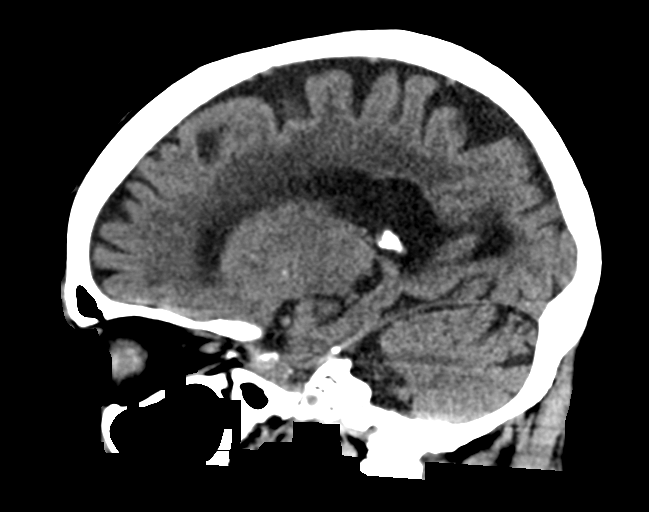

[16 of 47 positions shown; findings below may reference images not displayed]

FINDINGS: Brain: Severe diffuse cortical atrophy and advanced periventricular
small vessel disease. The brain demonstrates no evidence of
hemorrhage, acute infarction, edema, mass effect, extra-axial fluid
collection, hydrocephalus or mass lesion.

Vascular: No hyperdense vessel or unexpected calcification.

Skull: Normal. Negative for fracture or focal lesion.

Sinuses/Orbits: No acute finding.

Other: None.
IMPRESSION: Severe diffuse cortical atrophy and advanced periventricular small
vessel disease. No acute findings.

## 2022-03-23 NOTE — ED Notes (Signed)
Patient slipped out of chair earlier today. Family brought in for check. Patient denies any complaints at this time. Ambulatory with assistance of RN without difficulties.

## 2022-03-23 NOTE — ED Notes (Signed)
dc ppw provided. Followup and rx information reviewed as applicable. Pt provides verbal consent for dc and declines vs at time of dc. Pt to lobby alert and oriented.

## 2022-03-23 NOTE — Discharge Instructions (Signed)
Your CT scans and x-rays did not reveal any acute injuries.  Please return to the emergency department for any new, worsening, or change in symptoms or other concerns.  Please follow-up with your outpatient provider.  It was a pleasure caring for you today.

## 2022-03-23 NOTE — ED Notes (Addendum)
FIRST NURSE: Pt from home via [place], reports of BLE pain and headache.  179/85, 78, 96RA, CBG- 74

## 2022-03-23 NOTE — ED Provider Notes (Signed)
[facility] Provider Note    Event Date/Time   First MD Initiated Contact with Patient 03/23/22 1808     (approximate)   History   Fall   HPI  Lori Pacheco is a 88 y.o. female with a past medical history of pancytopenia, superior mesenteric artery stenosis, stage IIIb chronic kidney disease,  hypertension, hyperlipidemia, calmative hypertension, hyperlipidemia, cognitive dysfunction who presents today for evaluation after sliding out of her chair.  Patient presents with her 2 daughters.  Patient reportedly was sitting on a chair with a loose cushion when the cushion slid out from under her and she slid to the ground.  Patient's daughter reports that the chair was approximately 2 feet off the ground.  The patient's daughter reports that she was outside when this happened to water plants for 5 minutes, when she returned the patient was on the ground.  She has been acting her normal self ever since the event.  She denies any pain anywhere.  She has been able to ambulate.  Daughters are quite certain that she was not on the ground for more than 5 minutes.  Patient Active Problem List   Diagnosis Date Noted   Pancytopenia ([place]) 12/15/2020   Superior mesenteric artery stenosis ([facility]) 03/02/2020   Stage 3b chronic kidney disease ([place]) 03/02/2020   History of pulmonary embolism 02/07/2020   Chronic abdominal pain 02/07/2020   Depression with anxiety 02/07/2020   Cognitive dysfunction 12/25/2018   Varicose veins of leg with pain, bilateral 12/04/2018   Deep vein thrombosis (DVT) of femoral vein of left lower extremity ([place]) 10/17/2018   Other constipation 04/23/2018   Leg cramps 04/23/2018   Secondary renal hyperparathyroidism ([place]) 05/15/2017   Atherosclerosis of abdominal aorta ([place]) 11/10/2016   Perennial allergic rhinitis with seasonal variation 11/10/2016   Vitamin D deficiency 03/29/2016   Hypothyroidism 09/18/2015   Essential hypertension 05/19/2015    Hyperlipemia 05/19/2015          Physical Exam   Triage Vital Signs: ED Triage Vitals  Enc Vitals Group     BP 03/23/22 1737 (!) 156/90     Pulse Rate 03/23/22 1737 75     Resp 03/23/22 1737 16     Temp 03/23/22 1737 97.8 F (36.6 C)     Temp Source 03/23/22 1737 Oral     SpO2 03/23/22 1737 100 %     Weight 03/23/22 1736 125 lb (56.7 kg)     Height 03/23/22 1736 5\' 1"  (1.549 m)     Head Circumference --      Peak Flow --      Pain Score 03/23/22 1736 0     Pain Loc --      Pain Edu? --      Excl. in [place]? --     Most recent vital signs: Vitals:   03/23/22 1737  BP: (!) 156/90  Pulse: 75  Resp: 16  Temp: 97.8 F (36.6 C)  SpO2: 100%    Physical Exam Vitals and nursing note reviewed.  Constitutional:      General: Awake and alert. No acute distress.    Appearance: Normal appearance. She is well-developed and normal weight.  HENT:     Head: Normocephalic and atraumatic. No battle sign or raccoon eyes. No hematoma    Mouth: Mucous membranes are moist.  Neck: No midline cervical spine tenderness.  Full range of motion of neck.  Negative Spurling test.  Negative Lhermitte sign.  Normal strength and  sensation in bilateral upper extremities. Normal grip strength bilaterally.  Normal intrinsic muscle function of the hand bilaterally.  Normal radial pulses bilaterally. Eyes:     General: PERRL. Normal EOMs        Right eye: No discharge.        Left eye: No discharge.     Conjunctiva/sclera: Conjunctivae normal.  Cardiovascular:     Rate and Rhythm: Normal rate and regular rhythm.     Pulses: Normal pulses.     Heart sounds: Normal heart sounds Pulmonary:     Effort: Pulmonary effort is normal. No respiratory distress.     Breath sounds: Normal breath sounds.  No chest wall tenderness or ecchymosis Abdominal:     Abdomen is soft. There is no abdominal tenderness. No rebound or guarding. No distention. Musculoskeletal:        General: No swelling. Normal range of  motion.     Cervical back: Normal range of motion and neck supple.  Pelvis stable, negative logroll bilaterally.  Able to actively lift both legs off of the stretcher.  No ecchymosis, erythema, or hematoma noted.  Normal distal pulses in all 4 extremities.  Normal range of motion of bilateral upper extremities.  No bony tenderness along arms or legs.  No vertebral tenderness Skin:    General: Skin is warm and dry.     Capillary Refill: Capillary refill takes less than 2 seconds.     Findings: No rash.  Neurological:     Mental Status: She is alert.  Neurological: GCS 15 alert and oriented x3 Normal speech, no expressive or receptive aphasia or dysarthria Cranial nerves II through XII intact Normal visual fields 5 out of 5 strength in all 4 extremities with intact sensation throughout No extremity drift Normal finger-to-nose testing, no limb or truncal ataxia Ambulatory with a steady gait      ED Results / Procedures / Treatments   Labs (all labs ordered are listed, but only abnormal results are displayed) Labs Reviewed - No data to display   EKG     RADIOLOGY I independently reviewed and interpreted imaging and agree with radiologists findings.    PROCEDURES:  Critical Care performed:   Procedures   MEDICATIONS ORDERED IN ED: Medications - No data to display   IMPRESSION / MDM / [place] / ED COURSE  I reviewed the triage vital signs and the nursing notes.   Differential diagnosis includes, but is not limited to, fracture, dislocation, intracranial hemorrhage, vertebral injury.  Patient is awake and alert, hemodynamically stable and neurologically and neurovascular intact.  She is at her mental baseline per her daughters who are currently with her.  Patient had a mechanical fall, no preceding symptoms.  CT head and neck obtained per [place] criteria.  Right hip pain also obtained, the patient reports that her pain is not new from her fall.  Imaging is  all reassuring against any acute findings.  Patient is able to ambulate independently to the bathroom.  She is at her mental baseline per her family members.  She was on the ground for only a matter of minutes, therefore rhabdomyolysis is unlikely.  No preceding symptoms to suggest other etiology of fall, and patient and family feel certain that this was mechanical because of cushion slid off.  Patient and family feel comfortable with discharge home.  We discussed return precautions and the importance of close outpatient follow-up.  Patient and family numbers understand and agree with plan.  Discharged in  stable condition.   Patient's presentation is most consistent with acute complicated illness / injury requiring diagnostic workup.    Clinical Course as of 03/23/22 2234  Wed Mar 23, 2022  1920 Patient ambulated to the bathroom with a steady gait [JP]    Clinical Course User Index [JP] Jakyia Gaccione, Clarnce Flock, PA-C     FINAL CLINICAL IMPRESSION(S) / ED DIAGNOSES   Final diagnoses:  Fall, initial encounter  Minor head injury, initial encounter  Pain of right hip     Rx / DC Orders   ED Discharge Orders     None        Note:  This document was prepared using Dragon voice recognition software and may include unintentional dictation errors.   Emeline Gins 03/23/22 2234    Vanessa Lavonia, MD 03/24/22 1357

## 2022-03-23 NOTE — ED Triage Notes (Signed)
Patient's family member found patient on floor today.  Patietn states she slid out of her chair. Family states patient was on floor for no more than 5 minutes.  Family concerned about possible injury and concerned about blood pressure.

## 2022-03-31 ENCOUNTER — Other Ambulatory Visit: Payer: Medicare PPO

## 2022-03-31 VITALS — BP 120/78 | HR 66 | Temp 97.4°F | Resp 20

## 2022-03-31 DIAGNOSIS — Z515 Encounter for palliative care: Secondary | ICD-10-CM

## 2022-03-31 NOTE — Progress Notes (Signed)
PATIENT NAME: Lori Pacheco DOB: [birth year] MRN: [id number]  PRIMARY CARE PROVIDER: Steele Sizer, MD  RESPONSIBLE PARTY:  Acct ID - Guarantor Home Phone Work Phone Relationship Acct Type  [id number] - Mcniel,[name]  Self P/F     [address], [place], [place] [address]    PLAN OF CARE and INTERVENTIONS:               1.  GOALS OF CARE/ ADVANCE CARE PLANNING:  Remain home under the care of her daughters.               2.  PATIENT/CAREGIVER EDUCATION:  Safety               4. PERSONAL EMERGENCY PLAN:  Activate 911 for emergencies               5.  DISEASE STATUS:  Anxiety:  Patient tends to have some anxiety in the afternoon.  She often feels lonely if family is not close by and enjoys having company more often.  Celexa was discussed earlier this year but daughter [name] elected to not start this due to side effects.  Patient is more quiet and withdrawn at start of visit but engaged more towards the end of the visit.  Daughter [name] present to assist with updates.   Constipation: Continues with mirlax and coffee.  No issues reported at this time.  Fall:  Patient slid out of her chair on onto the floor last week.  Daughter [name] found patient laying in the floor and activated 911 for assistance.  Patient was evaluated in the ED and returned home.  She is using a walking stick on occasion but notes she often forgets and has to go back to get this.    Pain:  Continues with lower back discomfort and occasional upper thigh discomfort.  Using lidocaine patches routinely that are helpful.    HISTORY OF PRESENT ILLNESS:  88 year old female with Chronic back pain, Depression, HLD, HTN, CKD 3, Hypothyroidism, Hx of PE on Eliquis. Patient is followed by Palliative Care every 4-8 weeks and prn.   CODE STATUS: DNR ADVANCED DIRECTIVES: No MOST FORM: Yes PPS: 50%   PHYSICAL EXAM:   VITALS: Today's Vitals   03/31/22 1307  BP: 120/78  Pulse: 66  Temp: (!) 97.4 F (36.3 C)   SpO2: 99%  PainSc: 0-No pain    LUNGS: clear to auscultation  CARDIAC: Cor RRR}  EXTREMITIES: - for edema SKIN: Skin color, texture, turgor normal. No rashes or lesions or mobility and turgor normal  NEURO: positive for gait problems       Lorenza Burton, RN

## 2022-03-31 NOTE — Progress Notes (Signed)
COMMUNITY PALLIATIVE CARE SW NOTE  PATIENT NAME: Lori Pacheco DOB: 04-04-1921 MRN: [id number]  PRIMARY CARE PROVIDER: Steele Sizer, MD  RESPONSIBLE PARTY:  Acct ID - Guarantor Home Phone Work Phone Relationship Acct Type  [id number] - Kiner,[name]  Self P/F     [address], [place], [place] [address]     PLAN OF CARE and INTERVENTIONS:               GOALS OF CARE/ ADVANCE CARE PLANNING: Goals include to maximize quality of life and symptom management. Our advance care planning conversation included a discussion about:    The value and importance of advance care planning  Review and updating or creation of an advance directive document.  Code status: DNR and MOST form completed. SW provided daughter, Lori Pacheco with copies of AD documents to include: durable POA, healthcare POA, and five wishes, daughter shared she will work them as soon as possible.   2.        Palliative care encounter: Palliative medicine team will continue to support patient, patient's family, and medical team. Visit consisted of counseling and education dealing with the complex and emotionally intense issues of symptom management and palliative care in the setting of serious and potentially life-threatening illness. SW completed visit with PC RN J. Owens Shark, met with patient and patient daughter Lori Pacheco.  Functional changes/updates: Patient has not had any functional change and decline since previous PC visit. Patient had a fall/slip out of her chair last week that resulted in an ED visit. No issues or injuries reported.  ADL's: patient share that she is independent with needs such as toileting, bathing and dressing. Family cooks and provides meals.  Psychosocial assessment: completed.  Home support: daughter is primary caregiver.  Long term planning: keep patient at home as long as possible.  Transportation: No needs. Food: No needs. Behaviors/safety: no concerns. Ongoing support/resources will  continued to be offered if needed.    SW discussed goals, reviewed care plan, provided emotional support, used active and reflective listening in the form of reciprocity emotional response. Family wishes to keep patient in the home for as long as possible. Questions and concerns were addressed. The patient/family was encouraged to call with any additional questions and/or concerns. PC Provided general support and encouragement, no other unmet needs identified. Will continue to follow.  3.         PATIENT/CAREGIVER EDUCATION/ COPING:   Appearance: well groomed, appropriate given situation  Mental Status: alert and oriented  Eye Contact: fair Thought Process: rational  Thought Content: not assessed  Speech: Normal rate, volume, tone  Mood: Normal and calm Affect: Congruent to endorsed mood, full ranging Insight: good Judgement: good Interaction Style: Cooperative  Patient A&O x3 and able to make needs known. Patient engaged in fluent conversation during visit. Patient experiences some anxiety and sadness due to missing her family. SW reviewed coping mechanisms and holistic approaches to address anxiety and sadness to include reviewing family photo albums and OTC natural stress reducing vitamins such as St Johns worts or Ashwagandha   Caregiver: daughter, Lori Pacheco, still works for school system part time and visits patient daily. Patients youngest daughter lives with patient and is available should patient need anything . Family is supportive and provides physical and emotional support to patient.   4.         PERSONAL EMERGENCY PLAN:  Patient/family will call 9-1-1 for emergencies.    5.  COMMUNITY RESOURCES COORDINATION/ HEALTH CARE NAVIGATION:  Patients daughter manages her care.  6.         FINANCIAL CONCERNS/NEEDS: None.                        Primary Health Insurance: [employer] Secondary Health Insurance: none Prescription Coverage: Yes, no history of difficulty obtaining  or affording prescriptions reported     SOCIAL HX:  Social History   Tobacco Use   Smoking status: Never   Smokeless tobacco: Former    Types: Snuff    Quit date: 09/27/1986   Tobacco comments:    smoking cessation materials not required  Substance Use Topics   Alcohol use: No    Alcohol/week: 0.0 standard drinks of alcohol    CODE STATUS: DNR  ADVANCED DIRECTIVES: discussed. Documents provided MOST FORM COMPLETE:  Y HOSPICE EDUCATION PROVIDED: N  PPS: Patient is INDP with all ADL's at this time. Patient is A&O with fair-good insight and judgement.    Time spent: 45 min  [place], [place]

## 2022-04-01 ENCOUNTER — Other Ambulatory Visit: Payer: Self-pay | Admitting: Family Medicine

## 2022-04-01 DIAGNOSIS — N2581 Secondary hyperparathyroidism of renal origin: Secondary | ICD-10-CM

## 2022-04-14 ENCOUNTER — Other Ambulatory Visit: Payer: Medicare PPO | Admitting: Nurse Practitioner

## 2022-04-20 ENCOUNTER — Other Ambulatory Visit: Payer: Self-pay | Admitting: Family Medicine

## 2022-04-20 DIAGNOSIS — N2581 Secondary hyperparathyroidism of renal origin: Secondary | ICD-10-CM

## 2022-04-28 ENCOUNTER — Other Ambulatory Visit: Payer: Medicare PPO | Admitting: Nurse Practitioner

## 2022-04-28 ENCOUNTER — Encounter: Payer: Self-pay | Admitting: Nurse Practitioner

## 2022-04-28 DIAGNOSIS — F419 Anxiety disorder, unspecified: Secondary | ICD-10-CM

## 2022-04-28 DIAGNOSIS — R5381 Other malaise: Secondary | ICD-10-CM | POA: Diagnosis not present

## 2022-04-28 DIAGNOSIS — F02818 Dementia in other diseases classified elsewhere, unspecified severity, with other behavioral disturbance: Secondary | ICD-10-CM

## 2022-04-28 DIAGNOSIS — Z515 Encounter for palliative care: Secondary | ICD-10-CM | POA: Diagnosis not present

## 2022-04-28 NOTE — Progress Notes (Signed)
    [facility] Palliative Care Consult Note Telephone: [phone]  Fax: [phone]    Date of encounter: 04/28/22 2:18 PM PATIENT NAME: Lori Pacheco [place] [address]   [phone] (home)  DOB: 1921/10/01 MRN: [id number] PRIMARY CARE PROVIDER:    Steele Sizer, MD,  [address] [place] [address] [phone]  REFERRING PROVIDER:   Steele Sizer, MD [address] [place],  [place] [address] [phone]  RESPONSIBLE PARTY:    Contact Information     Name Relation Home Work Guinda Daughter [phone]  [phone]   Lori Pacheco Daughter [phone]  [phone]     Due to the COVID-19 crisis, this visit was done via telemedicine from my office and it was initiated and consent by this patient and or family.   I connected Lori Pacheco daughter and Lori Pacheco OR PROXY on 01/11/22 by telephone as video is not available verified that I am speaking with the correct person.  Palliative Care was asked to follow this patient by consultation request of  Lori Sizer, MD to address advance care planning and complex medical decision making. This is a follow up visit ASSESSMENT AND PLAN / RECOMMENDATIONS:  Symptom Management/Plan: ACP: DNR   2. Anxiety secondary to dementia; stable; continue to monitor mood. Discussed, continue celexa.    3.Debility secondary to Dementia: dementia progressive currently stable at present time. Continue to monitor weights, appetite, encourage [name] to use a walker or cane with ambulation, stress fall precautions.    Follow up Palliative Care Visit: Palliative care will continue to follow for complex medical decision making, advance care planning, and clarification of goals. Return 6 weeks prn   I spent 32 minutes providing this consultation. More than 50% of the time in this consultation was spent in counseling and care coordination. PPS:  50% Chief Complaint: Follow up palliative consult for complex medical decision making   HISTORY OF PRESENT ILLNESS:  Lori Pacheco is a 88 y.o. year old female  with multiple medical problems including monoclonal gammopathy of undetermined significance (MGUS), Chronic back pain, Depression, HLD, HTN, CKD 3, Hypothyroidism, Hx of PE on Eliquis. I connected with Lori Pacheco by telephone as video not available for f/u pc visit. Lori Pacheco put on conference so [name] could participate. We talked about how [name] has been feeling. [name] endorses no other changes, no problems. Lori Pacheco endorses [name] had a fall yesterday, no injury. We talked about the importance of using a can or walker which [name] declines to do. We talked about fall risk and concern for serious injuries. [name] verbalized understanding. No wounds, infections or hospitalizations. We talked about ros, continues to be ambulatory, cleans her home, slowly. We talked about appetite, no weight loss or changes in clothes size. We talked about overall [name] is stable. We talked about medical goals, PC with f/u visits scheduled. Emotional support provided, Questions answered.    History obtained from review of EMR, discussion with Daughter Lori Pacheco with [name].  I reviewed available labs, medications, imaging, studies and related documents from the EMR.  Records reviewed and summarized above.    ROS 10 point system reviewed all negative except +fall and HPI   Physical Exam:  deferred  [place] Ihor Gully, NP

## 2022-05-04 ENCOUNTER — Telehealth: Payer: Self-pay | Admitting: Family Medicine

## 2022-05-04 DIAGNOSIS — N2581 Secondary hyperparathyroidism of renal origin: Secondary | ICD-10-CM

## 2022-05-04 NOTE — Telephone Encounter (Signed)
Medication Refill - Medication: Vitamin D, Ergocalciferol, (DRISDOL) 1.25 MG (50000 UNIT) CAPS capsule  Has the patient contacted their pharmacy? Yes.   (Agent: If no, request that the patient contact the pharmacy for the refill. If patient does not wish to contact the pharmacy document the reason why and proceed with request.) (Agent: If yes, when and what did the pharmacy advise?)  Preferred Pharmacy (with phone number or street name):  [place], [place]  [place] [address]  Phone: [phone] Fax: [phone]   Has the patient been seen for an appointment in the last year OR does the patient have an upcoming appointment? Yes.    Agent: Please be advised that RX refills may take up to 3 business days. We ask that you follow-up with your pharmacy.

## 2022-05-05 ENCOUNTER — Other Ambulatory Visit: Payer: Self-pay

## 2022-05-05 DIAGNOSIS — N2581 Secondary hyperparathyroidism of renal origin: Secondary | ICD-10-CM

## 2022-05-05 NOTE — Telephone Encounter (Signed)
Refilled 04/01/22 # 12 with 1 refill. Requested Prescriptions  Refused Prescriptions Disp Refills  . Vitamin D, Ergocalciferol, (DRISDOL) 1.25 MG (50000 UNIT) CAPS capsule 12 capsule 1     Endocrinology:  Vitamins - Vitamin D Supplementation 2 Failed - 05/04/2022 11:22 AM      Failed - Manual Review: Route requests for 50,000 IU strength to the provider      Failed - Vitamin D in normal range and within 360 days    Vit D, 25-Hydroxy  Date Value Ref Range Status  05/22/2018 48 30 - 100 ng/mL Final    Comment:    Vitamin D Status         25-OH Vitamin D: . Deficiency:                    <20 ng/mL Insufficiency:             20 - 29 ng/mL Optimal:                 > or = 30 ng/mL . For 25-OH Vitamin D testing on patients on  D2-supplementation and patients for whom quantitation  of D2 and D3 fractions is required, the QuestAssureD(TM) 25-OH VIT D, (D2,D3), LC/MS/MS is recommended: order  code [id number] (patients >57yrs). . For more information on this test, go to: http://education.questdiagnostics.com/faq/FAQ163 (This link is being provided for  informational/educational purposes only.)          Passed - Ca in normal range and within 360 days    Calcium  Date Value Ref Range Status  07/02/2021 9.3 8.6 - 10.4 mg/dL Final   Calcium, Total  Date Value Ref Range Status  11/22/2014 8.9 8.5 - 10.1 mg/dL Final         Passed - Valid encounter within last 12 months    Recent Outpatient Visits          4 months ago Atherosclerosis of abdominal aorta [facility])   [facility] Steele Sizer, MD   7 months ago Stage 3a chronic kidney disease [facility])   [facility] Steele Sizer, MD   10 months ago Secondary renal hyperparathyroidism [facility])   [facility] Steele Sizer, MD   1 year ago Essential hypertension   [facility] [place], Drue Stager, MD   1 year ago Recurrent deep vein thrombosis (DVT) of lower extremity,  unspecified laterality [facility])   [facility] Steele Sizer, MD      Future Appointments            In 4 days Steele Sizer, MD [facility], [place]   In 1 month Steele Sizer, MD [place]   In 1 month [place], Christin Z, NP [facility]

## 2022-05-06 ENCOUNTER — Ambulatory Visit: Payer: Medicare PPO | Admitting: Family Medicine

## 2022-05-06 NOTE — Progress Notes (Unsigned)
Name: Lori Pacheco   MRN: [id number]    DOB: 10-08-1921   Date:05/09/2022       Progress Note  Subjective  Chief Complaint  Follow up   HPI  Patient came in today with her daughter [name] -lives next door and brought her in today-  patient's youngest daughter - Lori Pacheco - lives with her)  , [name] and daughters are aware of her advanced age and multiple medical problems that are not managed such as CKI and also MGUS and increase in cognitive dysfunction. She is now having palliative care by [facility] visiting her once a month She is DNR She fell again last week in her yard but did not have to go to emergency room, tripped in her yard    She has been doing better since she comes to the office more often, no visits to the [facility]. She has been seeing family members more often. She loves having her great-grandchild -  he is  Lori Pacheco son, he is spending a lot of time at her house during the Summer . She is still raking leaves , sweeping floors , getting wood for the heater.    She used to have a lot of pruritis but doing well lately. She has hydroxyzine at home but not using it lately    CKI with secondary hyperparathyroidism: not willing to see nephrologist at this time, daughter still wants to check labs today   CT abdomen done [facility] in 2015   1.  No evidence of bowel ischemia. 2.  Prominent calcified atheromatous plaque at the origin of the celiac artery and SMA resulting in mild-moderate stenosis, as above, but the distal branches remain patent. 3.  Small amount of free fluid in the pelvis of unknown etiology.    Repeat CT abdomen at [facility] 01/2020   1. Right lower lobe pulmonary embolism. This was discussed with the ordering ER physician. 2. 3.3 x 1.1 cm hypoattenuating lesion within the pancreatic head, favored to represent a sidebranch IPMN. In the setting of advanced age, no definite follow-up is necessary, however, this may be further evaluated with MRI/MRCP as clinically  desired. 3. Small-volume abdominal pelvic ascites with diffuse mesenteric edema. Findings are nonspecific without identifiable cause by CT. 4. No evidence of bowel obstruction as clinically questioned.   DVT left leg: she had DVT on left leg, also history of PE. Marland Kitchen No calf tenderness or swelling.  She is off Eliquis    MDD: daughter palliative care has been visiting her monthly. She was on sertraline but stopped taking on her own, but now she takes it prn  she also only applies lidoderm prn when pain is present.   Atherosclerosis of aorta/ she also has mesenteric artery stenosis: she is no longer taking eliquis and refuses statin therapy, she has intermittent abdominal pain after meals , eating small amounts, weight has been stable She states only has pain occasionally now   Pancreatic mass: found on CT abdomen, daughter and patient are aware of the findings of pancreatic mass on CT done at [facility] showed a mass, she denies any recent abdominal pain. Family agrees she would not tolerate surgery due to advance age Unchanged    CT result :  PANCREAS: Lobulated hypoattenuating lesion within the pancreatic head measuring 3.3 x 1.1 cm (4:26). Top differential consideration would include a sidebranch IPMN.   Constipation: she is doing better now that she has been taking miralax daily , mixed with her coffee  ,  doing well since she has been compliant with miralax It seems like the pain has decreased with normal bowel movements Doing well    Hypothyroidism: she is on levothyroxine 75 mcg 5 days and last level was at goal. She still has dry skin but stable. We will recheck labs today   Malnutrition: ***  Pancytopenia: used to go to cancer center , but not currently , last platelets was low   Cognitive dysfunction: she seems to be hallucinating intermittently , she talks to people that have already died, sometimes she says that people have been in the house, but not afraid, no harming herself or others and  not wondering   Right flank pain/neuropathy: responds to lidoderm patch but she only uses it prn Doing well at this time  Patient Active Problem List   Diagnosis Date Noted   Chronic pain syndrome 05/09/2022   Palliative care patient 05/09/2022   Neuropathic pain of flank, right 05/09/2022   Benign hypertension with chronic kidney disease, stage III ([name]) 05/09/2022   Mild major depression ([place]) 05/09/2022   Stage 3a chronic kidney disease ([name]) 05/09/2022   Pancytopenia ([name]) 12/15/2020   Superior mesenteric artery stenosis ([name]) 03/02/2020   Stage 3b chronic kidney disease ([name]) 03/02/2020   History of pulmonary embolism 02/07/2020   Chronic abdominal pain 02/07/2020   Depression with anxiety 02/07/2020   Cognitive dysfunction 12/25/2018   Varicose veins of leg with pain, bilateral 12/04/2018   Deep vein thrombosis (DVT) of femoral vein of left lower extremity ([name]) 10/17/2018   Other constipation 04/23/2018   Leg cramps 04/23/2018   Thrombocytopenia ([place]) 05/15/2017   Secondary renal hyperparathyroidism ([name]) 05/15/2017   Atherosclerosis of abdominal aorta ([place]) 11/10/2016   Perennial allergic rhinitis with seasonal variation 11/10/2016   Vitamin D deficiency 03/29/2016   Hypothyroidism 09/18/2015   Essential hypertension 05/19/2015   Hyperlipemia 05/19/2015    Past Surgical History:  Procedure Laterality Date   NO PAST SURGERIES      Family History  Problem Relation Age of Onset   Healthy Mother    Healthy Father    Heart disease Brother    Cancer Brother    Cancer Daughter    Alcohol abuse Son    Diabetes Son    Heart disease Son     Social History   Tobacco Use   Smoking status: Never   Smokeless tobacco: Former    Types: Snuff    Quit date: 09/27/1986   Tobacco comments:    smoking cessation materials not required  Substance Use Topics   Alcohol use: No    Alcohol/week: 0.0 standard drinks of alcohol     Current Outpatient Medications:     acetaminophen (TYLENOL) 500 MG tablet, Take 1 tablet (500 mg total) by mouth every 6 (six) hours as needed., Disp: 100 tablet, Rfl: 0   amLODipine (NORVASC) 10 MG tablet, Take 1 tablet (10 mg total) by mouth daily., Disp: 90 tablet, Rfl: 1   Blood Glucose-BP Monitor (BLOOD GLUCOSE-WRIST BP MONITOR) DEVI, 1 each by Does not apply route daily., Disp: 1 each, Rfl: 0   hydrOXYzine (ATARAX/VISTARIL) 10 MG tablet, Take 1 tablet (10 mg total) by mouth at bedtime., Disp: 90 tablet, Rfl: 1   levocetirizine (XYZAL) 5 MG tablet, Take 1 tablet (5 mg total) by mouth every evening., Disp: 90 tablet, Rfl: 1   lidocaine (LIDODERM) 5 %, Place 2 patches onto the skin daily. (remove after 12 hours), Disp: 180 patch, Rfl: 1   lisinopril (  ZESTRIL) 20 MG tablet, Take 1 tablet (20 mg total) by mouth daily., Disp: 90 tablet, Rfl: 1   polyethylene glycol powder (GLYCOLAX/MIRALAX) 17 GM/SCOOP powder, Take 17 g by mouth daily with breakfast., Disp: 3350 g, Rfl: 1   Vitamin D, Ergocalciferol, (DRISDOL) 1.25 MG (50000 UNIT) CAPS capsule, TAKE 1 CAPSULE ONE TIME WEEKLY, Disp: 12 capsule, Rfl: 1   levothyroxine (SYNTHROID) 75 MCG tablet, TAKE 1 TABLET ONCE DAILY BEFORE BREAKFAST ON MONDAY THROUGH FRIDAY. SKIP DOSE ON WEEKENDS, Disp: 65 tablet, Rfl: 1   omeprazole (PRILOSEC) 20 MG capsule, Take 1 capsule (20 mg total) by mouth daily., Disp: 90 capsule, Rfl: 1   sertraline (ZOLOFT) 25 MG tablet, Take 1 tablet (25 mg total) by mouth daily. Happy pill, Disp: 90 tablet, Rfl: 1  Allergies  Allergen Reactions   Lyrica [Pregabalin]     " I felt groggy"    I personally reviewed active problem list, medication list, allergies, family history, social history, health maintenance with the patient/caregiver today.   ROS  Constitutional: Negative for fever or weight change.  Respiratory: Negative for cough and shortness of breath.   Cardiovascular: Negative for chest pain or palpitations.  Gastrointestinal: Negative for abdominal  pain, no bowel changes.  Musculoskeletal: Negative for gait problem or joint swelling.  Skin: Negative for rash.  Neurological: Negative for dizziness or headache.  No other specific complaints in a complete review of systems (except as listed in HPI above).   Objective  Vitals:   05/09/22 1504  BP: 116/72  Pulse: 73  Resp: 14  Temp: 97.9 F (36.6 C)  TempSrc: Oral  SpO2: 96%  Weight: 126 lb 12.8 oz (57.5 kg)  Height: 5\' 1"  (1.549 m)    Body mass index is 23.96 kg/m.  Physical Exam  Constitutional: Patient appears well-developed and well-nourished. Obese *** No distress.  HEENT: head atraumatic, normocephalic, pupils equal and reactive to light, ears ***, neck supple, throat within normal limits Cardiovascular: Normal rate, regular rhythm and normal heart sounds.  No murmur heard. No BLE edema. Pulmonary/Chest: Effort normal and breath sounds normal. No respiratory distress. Abdominal: Soft.  There is no tenderness. Psychiatric: Patient has a normal mood and affect. behavior is normal. Judgment and thought content normal.   PHQ2/9:    05/09/2022    3:07 PM 01/04/2022    3:06 PM 10/01/2021    2:08 PM 09/28/2021    2:28 PM 07/02/2021    9:10 AM  Depression screen PHQ 2/9  Decreased Interest 1 1 0 0 0  Down, Depressed, Hopeless 1 3 1 1 1   PHQ - 2 Score 2 4 1 1 1   Altered sleeping 1 0 0 0 1  Tired, decreased energy 1 0 0 0 0  Change in appetite 0 0 0 0 0  Feeling bad or failure about yourself  0 1 1 1 1   Trouble concentrating 3 0 0 0 0  Moving slowly or fidgety/restless 0 0 0 0 0  Suicidal thoughts 0 0 0 0 0  PHQ-9 Score 7 5 2 2 3   Difficult doing work/chores Not difficult at all   Not difficult at all     phq 9 is {gen pos [id number]}   Fall Risk:    05/09/2022    3:06 PM 01/04/2022    3:06 PM 10/01/2021    2:08 PM 09/28/2021    2:30 PM 07/02/2021    9:10 AM  Fall Risk   Falls in the past year? 0 1 1  1 1  Number falls in past yr:  1 1 1  0  Injury with Fall?   0 0 0 0  Risk for fall due to : Impaired balance/gait Impaired balance/gait;Impaired mobility Impaired balance/gait History of fall(s);Impaired balance/gait Impaired mobility;Impaired balance/gait  Follow up Falls prevention discussed;Education provided;Falls evaluation completed Falls prevention discussed Falls prevention discussed Falls prevention discussed Falls prevention discussed      Functional Status Survey: Is the patient deaf or have difficulty hearing?: Yes Does the patient have difficulty seeing, even when wearing glasses/contacts?: Yes Does the patient have difficulty concentrating, remembering, or making decisions?: Yes Does the patient have difficulty walking or climbing stairs?: No Does the patient have difficulty dressing or bathing?: No Does the patient have difficulty doing errands alone such as visiting a doctor's office or shopping?: Yes    Assessment & Plan  *** There are no diagnoses linked to this encounter.

## 2022-05-09 ENCOUNTER — Ambulatory Visit: Payer: Medicare PPO | Admitting: Family Medicine

## 2022-05-09 ENCOUNTER — Encounter: Payer: Self-pay | Admitting: Family Medicine

## 2022-05-09 VITALS — BP 116/72 | HR 73 | Temp 97.9°F | Resp 14 | Ht 61.0 in | Wt 126.8 lb

## 2022-05-09 DIAGNOSIS — Z86711 Personal history of pulmonary embolism: Secondary | ICD-10-CM

## 2022-05-09 DIAGNOSIS — N183 Chronic kidney disease, stage 3 unspecified: Secondary | ICD-10-CM | POA: Diagnosis not present

## 2022-05-09 DIAGNOSIS — K219 Gastro-esophageal reflux disease without esophagitis: Secondary | ICD-10-CM

## 2022-05-09 DIAGNOSIS — F320 Major depressive disorder, single episode, mild: Secondary | ICD-10-CM | POA: Diagnosis not present

## 2022-05-09 DIAGNOSIS — G8929 Other chronic pain: Secondary | ICD-10-CM

## 2022-05-09 DIAGNOSIS — M792 Neuralgia and neuritis, unspecified: Secondary | ICD-10-CM

## 2022-05-09 DIAGNOSIS — Z9181 History of falling: Secondary | ICD-10-CM

## 2022-05-09 DIAGNOSIS — Z515 Encounter for palliative care: Secondary | ICD-10-CM | POA: Insufficient documentation

## 2022-05-09 DIAGNOSIS — E039 Hypothyroidism, unspecified: Secondary | ICD-10-CM

## 2022-05-09 DIAGNOSIS — N1831 Chronic kidney disease, stage 3a: Secondary | ICD-10-CM | POA: Diagnosis not present

## 2022-05-09 DIAGNOSIS — I700 Atherosclerosis of aorta: Secondary | ICD-10-CM

## 2022-05-09 DIAGNOSIS — D61818 Other pancytopenia: Secondary | ICD-10-CM

## 2022-05-09 DIAGNOSIS — F419 Anxiety disorder, unspecified: Secondary | ICD-10-CM

## 2022-05-09 DIAGNOSIS — I10 Essential (primary) hypertension: Secondary | ICD-10-CM

## 2022-05-09 DIAGNOSIS — F09 Unspecified mental disorder due to known physiological condition: Secondary | ICD-10-CM

## 2022-05-09 DIAGNOSIS — K551 Chronic vascular disorders of intestine: Secondary | ICD-10-CM

## 2022-05-09 DIAGNOSIS — N2581 Secondary hyperparathyroidism of renal origin: Secondary | ICD-10-CM | POA: Diagnosis not present

## 2022-05-09 DIAGNOSIS — I129 Hypertensive chronic kidney disease with stage 1 through stage 4 chronic kidney disease, or unspecified chronic kidney disease: Secondary | ICD-10-CM | POA: Diagnosis not present

## 2022-05-09 DIAGNOSIS — G894 Chronic pain syndrome: Secondary | ICD-10-CM | POA: Insufficient documentation

## 2022-05-09 DIAGNOSIS — D696 Thrombocytopenia, unspecified: Secondary | ICD-10-CM

## 2022-05-09 DIAGNOSIS — K8689 Other specified diseases of pancreas: Secondary | ICD-10-CM

## 2022-05-09 MED ORDER — OMEPRAZOLE 20 MG PO CPDR: 20.0000 mg | DELAYED_RELEASE_CAPSULE | Freq: Every day | ORAL | 1 refills | Status: DC

## 2022-05-09 MED ORDER — SERTRALINE HCL 25 MG PO TABS: 25.0000 mg | ORAL_TABLET | Freq: Every day | ORAL | 1 refills | Status: DC

## 2022-05-09 MED ORDER — LEVOTHYROXINE SODIUM 75 MCG PO TABS: ORAL_TABLET | ORAL | 1 refills | Status: DC

## 2022-05-10 ENCOUNTER — Other Ambulatory Visit: Payer: Self-pay | Admitting: Family Medicine

## 2022-05-10 DIAGNOSIS — Z9181 History of falling: Secondary | ICD-10-CM | POA: Insufficient documentation

## 2022-05-10 DIAGNOSIS — K219 Gastro-esophageal reflux disease without esophagitis: Secondary | ICD-10-CM | POA: Insufficient documentation

## 2022-05-10 LAB — CBC WITH DIFFERENTIAL/PLATELET
Absolute Monocytes: 210 cells/uL (ref 200–950)
Basophils Absolute: 11 cells/uL (ref 0–200)
Basophils Relative: 0.4 %
Eosinophils Absolute: 20 cells/uL (ref 15–500)
Eosinophils Relative: 0.7 %
HCT: 34.3 % — ABNORMAL LOW (ref 35.0–45.0)
Hemoglobin: 11.3 g/dL — ABNORMAL LOW (ref 11.7–15.5)
Lymphs Abs: 994 cells/uL (ref 850–3900)
MCH: 29.5 pg (ref 27.0–33.0)
MCHC: 32.9 g/dL (ref 32.0–36.0)
MCV: 89.6 fL (ref 80.0–100.0)
MPV: 11 fL (ref 7.5–12.5)
Monocytes Relative: 7.5 %
Neutro Abs: 1565 cells/uL (ref 1500–7800)
Neutrophils Relative %: 55.9 %
Platelets: 135 10*3/uL — ABNORMAL LOW (ref 140–400)
RBC: 3.83 10*6/uL (ref 3.80–5.10)
RDW: 13.2 % (ref 11.0–15.0)
Total Lymphocyte: 35.5 %
WBC: 2.8 10*3/uL — ABNORMAL LOW (ref 3.8–10.8)

## 2022-05-10 LAB — COMPLETE METABOLIC PANEL WITH GFR
AG Ratio: 1.3 (calc) (ref 1.0–2.5)
ALT: 7 U/L (ref 6–29)
AST: 15 U/L (ref 10–35)
Albumin: 4 g/dL (ref 3.6–5.1)
Alkaline phosphatase (APISO): 74 U/L (ref 37–153)
BUN/Creatinine Ratio: 17 (calc) (ref 6–22)
BUN: 24 mg/dL (ref 7–25)
CO2: 26 mmol/L (ref 20–32)
Calcium: 9.2 mg/dL (ref 8.6–10.4)
Chloride: 104 mmol/L (ref 98–110)
Creat: 1.4 mg/dL — ABNORMAL HIGH (ref 0.60–0.95)
Globulin: 3 g/dL (calc) (ref 1.9–3.7)
Glucose, Bld: 91 mg/dL (ref 65–99)
Potassium: 5.3 mmol/L (ref 3.5–5.3)
Sodium: 140 mmol/L (ref 135–146)
Total Bilirubin: 0.5 mg/dL (ref 0.2–1.2)
Total Protein: 7 g/dL (ref 6.1–8.1)
eGFR: 34 mL/min/{1.73_m2} — ABNORMAL LOW (ref >=60)

## 2022-05-10 LAB — TSH: TSH: 21.04 mIU/L — ABNORMAL HIGH (ref 0.40–4.50)

## 2022-05-10 LAB — PTH, INTACT AND CALCIUM
Calcium: 9.2 mg/dL (ref 8.6–10.4)
PTH: 93 pg/mL — ABNORMAL HIGH (ref 16–77)

## 2022-05-10 LAB — VITAMIN D 25 HYDROXY (VIT D DEFICIENCY, FRACTURES): Vit D, 25-Hydroxy: 122 ng/mL — ABNORMAL HIGH (ref 30–100)

## 2022-05-16 ENCOUNTER — Ambulatory Visit: Payer: Self-pay

## 2022-05-16 NOTE — Telephone Encounter (Signed)
  Chief Complaint: BP concerns Symptoms: none Frequency: ongoing Pertinent Negatives: Patient denies Weakness dizziness Disposition: [] ED /[] Urgent Care (no appt availability in office) / [] Appointment(In office/virtual)/ []  [facility]/ [] Home Care/ [] Refused Recommended Disposition /[] [place] [facility]/ [x]  Follow-up with PCP Additional Notes: Daughter has been monitoring pt's BP. Friday bp was 168/77, today it was 113/68, and a few minutes ago 117/63. Discussed normal readings and proper procedure for taking BP. Also discussed if pt was weak or dizzy she should call back. Reason for Disposition  Health Information question, no triage required and triager able to answer question  Answer Assessment - Initial Assessment Questions 1. REASON FOR CALL or QUESTION: "What is your reason for calling today?" or "How can I best help you?" or "What question do you have that I can help answer?"  Daughter was taking pt's BP and needed more information regarding normal.  Protocols used: Information Only Call - No Triage-A-AH

## 2022-06-08 ENCOUNTER — Ambulatory Visit: Payer: Medicare PPO | Admitting: Family Medicine

## 2022-06-16 ENCOUNTER — Telehealth: Payer: Medicare PPO | Admitting: Nurse Practitioner

## 2022-07-14 ENCOUNTER — Encounter (HOSPITAL_COMMUNITY): Payer: Self-pay

## 2022-07-14 ENCOUNTER — Other Ambulatory Visit: Payer: Self-pay

## 2022-07-14 ENCOUNTER — Emergency Department (HOSPITAL_COMMUNITY)
Admission: EM | Admit: 2022-07-14 | Discharge: 2022-07-14 | Disposition: A | Discharge status: Home / Self Care | Payer: Medicare PPO | Attending: Student | Admitting: Student

## 2022-07-14 ENCOUNTER — Emergency Department (HOSPITAL_COMMUNITY): Payer: Medicare PPO

## 2022-07-14 DIAGNOSIS — M545 Low back pain, unspecified: Secondary | ICD-10-CM | POA: Diagnosis not present

## 2022-07-14 DIAGNOSIS — E663 Overweight: Secondary | ICD-10-CM | POA: Diagnosis not present

## 2022-07-14 DIAGNOSIS — M5459 Other low back pain: Secondary | ICD-10-CM | POA: Diagnosis not present

## 2022-07-14 DIAGNOSIS — W228XXA Striking against or struck by other objects, initial encounter: Secondary | ICD-10-CM | POA: Diagnosis not present

## 2022-07-14 DIAGNOSIS — Z6825 Body mass index (BMI) 25.0-25.9, adult: Secondary | ICD-10-CM | POA: Diagnosis not present

## 2022-07-14 DIAGNOSIS — W19XXXA Unspecified fall, initial encounter: Secondary | ICD-10-CM

## 2022-07-14 NOTE — ED Triage Notes (Signed)
Pt presents to ED following fall on Monday. Pt missed seat and hit back of step, has bruise on back from step.Denies LOC or hitting head. Pt not on blood thinners

## 2022-07-14 NOTE — Discharge Instructions (Addendum)
No fractures in your back.  This is good news.  The pain is likely coming from the bruising.  Continue taking Tylenol or ibuprofen for pain. I would like for you to follow up with your primary care doctor for further evaluation. Please return to the ED for worsening symptoms.

## 2022-07-14 NOTE — ED Provider Notes (Signed)
[facility] EMERGENCY DEPARTMENT Provider Note   CSN: [id number] Arrival date & time: 07/14/22  1444     History Chief Complaint  Patient presents with   Lori Pacheco is a 88 y.o. female patient who presents to the emergency department with lower back pain after a fall that occurred 4 days ago.  Patient states that she was going to sit down and she missed the seat and fell on her bottom.  She has been ambulatory and able to perform her ADLs since then but her back is painful.  She denies any bowel or bladder incontinence, focal weakness or numbness, or saddle anesthesia.    Fall       Home Medications Prior to Admission medications   Medication Sig Start Date End Date Taking? Authorizing Provider  acetaminophen (TYLENOL) 500 MG tablet Take 1 tablet (500 mg total) by mouth every 6 (six) hours as needed. 07/24/17   Steele Sizer, MD  amLODipine (NORVASC) 10 MG tablet Take 1 tablet (10 mg total) by mouth daily. 10/01/21   Steele Sizer, MD  Blood Glucose-BP Monitor (BLOOD GLUCOSE-WRIST BP MONITOR) DEVI 1 each by Does not apply route daily. 04/15/19   Steele Sizer, MD  hydrOXYzine (ATARAX/VISTARIL) 10 MG tablet Take 1 tablet (10 mg total) by mouth at bedtime. 06/08/20   Steele Sizer, MD  levocetirizine (XYZAL) 5 MG tablet Take 1 tablet (5 mg total) by mouth every evening. 06/08/20   Steele Sizer, MD  levothyroxine (SYNTHROID) 75 MCG tablet TAKE 1 TABLET ONCE DAILY BEFORE BREAKFAST ON MONDAY THROUGH FRIDAY. SKIP DOSE ON WEEKENDS 05/09/22   Steele Sizer, MD  lidocaine (LIDODERM) 5 % Place 2 patches onto the skin daily. (remove after 12 hours) 10/01/21   Ancil Boozer, Drue Stager, MD  lisinopril (ZESTRIL) 20 MG tablet Take 1 tablet (20 mg total) by mouth daily. 10/01/21   Steele Sizer, MD  omeprazole (PRILOSEC) 20 MG capsule Take 1 capsule (20 mg total) by mouth daily. 05/09/22   Steele Sizer, MD  polyethylene glycol powder (GLYCOLAX/MIRALAX) 17 GM/SCOOP powder Take 17 g  by mouth daily with breakfast. 04/16/21   Steele Sizer, MD  sertraline (ZOLOFT) 25 MG tablet Take 1 tablet (25 mg total) by mouth daily. Happy pill 05/09/22   Steele Sizer, MD      Allergies    Lyrica [pregabalin]    Review of Systems   Review of Systems  All other systems reviewed and are negative.   Physical Exam Updated Vital Signs BP (!) 161/88 (BP Location: Right Arm)   Pulse 80   Temp 98.5 F (36.9 C) (Oral)   Resp 18   Ht 5\' 3"  (1.6 m)   Wt 58.1 kg   SpO2 100%   BMI 22.67 kg/m  Physical Exam Vitals and nursing note reviewed.  Constitutional:      Appearance: Normal appearance.  HENT:     Head: Normocephalic and atraumatic.  Eyes:     General:        Right eye: No discharge.        Left eye: No discharge.     Conjunctiva/sclera: Conjunctivae normal.  Pulmonary:     Effort: Pulmonary effort is normal.  Musculoskeletal:     Comments: There is old healing ecchymosis over the lumbar spine with some tenderness. Equal strength.   Skin:    General: Skin is warm and dry.     Findings: No rash.  Neurological:     General: No focal deficit present.  Mental Status: She is alert.  Psychiatric:        Mood and Affect: Mood normal.        Behavior: Behavior normal.     ED Results / Procedures / Treatments   Labs (all labs ordered are listed, but only abnormal results are displayed) Labs Reviewed - No data to display  EKG None  Radiology DG Lumbar Spine Complete  Result Date: 07/14/2022 CLINICAL DATA:  Right-sided lower back pain after fall. EXAM: LUMBAR SPINE - COMPLETE 4+ VIEW COMPARISON:  None Available. FINDINGS: No fracture or spondylolisthesis is noted. Moderate dextroscoliosis of lower thoracic and lumbar spine is noted. Severe degenerative disc disease is seen at all levels of the lumbar spine. IMPRESSION: Severe multilevel degenerative disc disease. No acute abnormality seen. Electronically Signed   By: Marijo Conception M.D.   On: 07/14/2022 17:55     Procedures Procedures    Medications Ordered in ED Medications - No data to display  ED Course/ Medical Decision Making/ A&P                           Medical Decision Making Lori Pacheco is a 88 y.o. female patient who presents to the emergency department today for further evaluation of the mechanism of fall and the patient's age I will get an x-ray to further evaluate.  I have a low suspicion for any fracture or dislocation.  I also have a low suspicion for cauda equina.  Imaging was normal.  I personally ordered and interpreted this study.  She is safe for discharge at this time.  Tylenol or ibuprofen for pain.  Strict return precautions were discussed.  I will have her follow-up with her primary care doctor for further evaluation.   Amount and/or Complexity of Data Reviewed Radiology: ordered.   Final Clinical Impression(s) / ED Diagnoses Final diagnoses:  Fall, initial encounter  Acute midline low back pain, unspecified whether sciatica present    Rx / DC Orders ED Discharge Orders     None         Cherrie Gauze 07/14/22 [place], Ankit, MD 07/14/22 2055

## 2022-07-15 ENCOUNTER — Other Ambulatory Visit: Payer: Self-pay | Admitting: Family Medicine

## 2022-07-15 DIAGNOSIS — F419 Anxiety disorder, unspecified: Secondary | ICD-10-CM

## 2022-07-15 DIAGNOSIS — L299 Pruritus, unspecified: Secondary | ICD-10-CM

## 2022-07-15 MED ORDER — HYDROXYZINE HCL 10 MG PO TABS: 10.0000 mg | ORAL_TABLET | Freq: Every evening | ORAL | 0 refills | Status: DC

## 2022-07-15 NOTE — Telephone Encounter (Signed)
Requested medication (s) are due for refill today: yes  Requested medication (s) are on the active medication list: yes  Last refill:  06/08/20 #90 1 RF  Future visit scheduled: yes  Notes to clinic:  pt has not taken drug in 2 years and Cr high (Kidney dx) sent back unsure if ok to refill   Requested Prescriptions  Pending Prescriptions Disp Refills   hydrOXYzine (ATARAX) 10 MG tablet 90 tablet 1    Sig: Take 1 tablet (10 mg total) by mouth at bedtime.     Ear, Nose, and Throat:  Antihistamines 2 Failed - 07/15/2022 10:11 AM      Failed - Cr in normal range and within 360 days    Creat  Date Value Ref Range Status  05/09/2022 1.40 (H) 0.60 - 0.95 mg/dL Final   Creatinine, Urine  Date Value Ref Range Status  02/21/2018 125 20 - 275 mg/dL Final         Passed - Valid encounter within last 12 months    Recent Outpatient Visits           2 months ago Secondary renal hyperparathyroidism [facility])   [facility] Steele Sizer, MD   6 months ago Atherosclerosis of abdominal aorta [facility])   [facility] Steele Sizer, MD   9 months ago Stage 3a chronic kidney disease [facility])   [facility] Steele Sizer, MD   1 year ago Secondary renal hyperparathyroidism [facility])   [facility] Steele Sizer, MD   1 year ago Essential hypertension   [facility] Steele Sizer, MD       Future Appointments             In 3 weeks Steele Sizer, MD [facility] [place]   In 1 month [place], Christin Z, NP [facility]

## 2022-07-15 NOTE — Telephone Encounter (Signed)
Medication Refill - Medication: hydrOXYzine (ATARAX/VISTARIL) 10 MG tablet [[id number]]  Has the patient contacted their pharmacy? No. (Agent: If no, request that the patient contact the pharmacy for the refill. If patient does not wish to contact the pharmacy document the reason why and proceed with request.) (Agent: If yes, when and what did the pharmacy advise?)  Preferred Pharmacy (with phone number or street name):  [place], [place] - [place]  [address] [place] [address]  Phone: [phone] Fax: [phone]  Hours: Not open 24 hours   Has the patient been seen for an appointment in the last year OR does the patient have an upcoming appointment? Yes.    Agent: Please be advised that RX refills may take up to 3 business days. We ask that you follow-up with your pharmacy.

## 2022-07-28 ENCOUNTER — Encounter: Payer: Self-pay | Admitting: Family Medicine

## 2022-08-09 NOTE — Progress Notes (Unsigned)
Name: Lori Pacheco   MRN: [id number]    DOB: 1921/07/10   Date:08/10/2022       Progress Note  Subjective  Chief Complaint  Follow Up  HPI  Patient came in today with her daughter Lori Pacheco -lives next door and brought her in today-  patient's youngest daughter - Lori Pacheco - lives with her)  , Ms. Arvie and daughters are aware of her advanced age and multiple medical problems that are not managed such as CKI and also MGUS and increase in cognitive dysfunction. She is now having palliative care by [facility] visiting her once a month She is DNR    She used to have a lot of pruritis but doing well lately. She has hydroxyzine at home but not using it lately    CKI with secondary hyperparathyroidism: not willing to see nephrologist at this time, last GFR was still low and high parathyroid level, advised again to stop giving her vitamin D due to high level Stable function   CT abdomen done [facility] in 2015   1.  No evidence of bowel ischemia. 2.  Prominent calcified atheromatous plaque at the origin of the celiac artery and SMA resulting in mild-moderate stenosis, as above, but the distal branches remain patent. 3.  Small amount of free fluid in the pelvis of unknown etiology.    Repeat CT abdomen at [facility] 01/2020   1. Right lower lobe pulmonary embolism. This was discussed with the ordering ER physician. 2. 3.3 x 1.1 cm hypoattenuating lesion within the pancreatic head, favored to represent a sidebranch IPMN. In the setting of advanced age, no definite follow-up is necessary, however, this may be further evaluated with MRI/MRCP as clinically desired. 3. Small-volume abdominal pelvic ascites with diffuse mesenteric edema. Findings are nonspecific without identifiable cause by CT. 4. No evidence of bowel obstruction as clinically questioned.   DVT left leg: she had DVT on left leg, also history of PE. Marland Kitchen No calf tenderness or swelling.  She is off Eliquis due to her age, recurrent falls and risk of bleeding     MDD: daughter palliative care has been visiting her monthly. She has not been taking zoloft anymore    Atherosclerosis of aorta/ she also has mesenteric artery stenosis: she is no longer taking eliquis and refuses statin therapy, she has intermittent abdominal pain after meals , eating small amounts, weight has been stable She states only has pain occasionally now   Pancreatic mass: found on CT abdomen, daughter and patient are aware of the findings of pancreatic mass on CT done at [facility] showed a mass, she denies any recent abdominal pain. Family agrees she would not tolerate surgery due to advance age Unchanged    CT result :  PANCREAS: Lobulated hypoattenuating lesion within the pancreatic head measuring 3.3 x 1.1 cm (4:26). Top differential consideration would include a side branch IPMN.   Constipation: she is doing better now that she has been taking miralax daily , mixed with her coffee  , doing well since she has been compliant with miralax It seems like the pain has decreased with normal bowel movements Unchanged    Hypothyroidism: she is on levothyroxine 75 mcg 5 days  She still has dry skin but stable. We will recheck labs today Last TSH was elevated   Pancytopenia She used to go to the cancer center but no longer due to not wanting treatment or monitoring. We will recheck level  Cognitive dysfunction: she seems to be hallucinating intermittently ,  she talks to people that have already died, sometimes she says that people have been in the house, but not afraid, no harming herself or others and not wandering   Right flank pain/neuropathy: responds to lidoderm patch but she only uses it prn Doing well at this time. Occasionally still complains of pain but no longer going to [facility] for that     Patient Active Problem List   Diagnosis Date Noted   History of recent fall 05/10/2022   Gastroesophageal reflux disease without esophagitis 05/10/2022   Chronic pain syndrome 05/09/2022    Palliative care patient 05/09/2022   Neuropathic pain of flank, right 05/09/2022   Benign hypertension with chronic kidney disease, stage III ([place]) 05/09/2022   Mild major depression ([place]) 05/09/2022   Stage 3a chronic kidney disease ([place]) 05/09/2022   Pancytopenia ([place]) 12/15/2020   Superior mesenteric artery stenosis ([place]) 03/02/2020   Stage 3b chronic kidney disease ([place]) 03/02/2020   History of pulmonary embolism 02/07/2020   Chronic abdominal pain 02/07/2020   Depression with anxiety 02/07/2020   Cognitive dysfunction 12/25/2018   Varicose veins of leg with pain, bilateral 12/04/2018   History of DVT (deep vein thrombosis) 10/17/2018   Other constipation 04/23/2018   Leg cramps 04/23/2018   Thrombocytopenia ([place]) 05/15/2017   Secondary renal hyperparathyroidism ([place]) 05/15/2017   Atherosclerosis of abdominal aorta ([place]) 11/10/2016   Perennial allergic rhinitis with seasonal variation 11/10/2016   Vitamin D deficiency 03/29/2016   Hypothyroidism 09/18/2015   Essential hypertension 05/19/2015   Hyperlipemia 05/19/2015    Past Surgical History:  Procedure Laterality Date   NO PAST SURGERIES      Family History  Problem Relation Age of Onset   Healthy Mother    Healthy Father    Heart disease Brother    Cancer Brother    Cancer Daughter    Alcohol abuse Son    Diabetes Son    Heart disease Son     Social History   Tobacco Use   Smoking status: Never   Smokeless tobacco: Former    Types: Snuff    Quit date: 09/27/1986   Tobacco comments:    smoking cessation materials not required  Substance Use Topics   Alcohol use: No    Alcohol/week: 0.0 standard drinks of alcohol     Current Outpatient Medications:    acetaminophen (TYLENOL) 500 MG tablet, Take 1 tablet (500 mg total) by mouth every 6 (six) hours as needed., Disp: 100 tablet, Rfl: 0   amLODipine (NORVASC) 10 MG tablet, Take 1 tablet (10 mg total) by mouth daily., Disp: 90 tablet, Rfl: 1   Blood  Glucose-BP Monitor (BLOOD GLUCOSE-WRIST BP MONITOR) DEVI, 1 each by Does not apply route daily., Disp: 1 each, Rfl: 0   hydrOXYzine (ATARAX) 10 MG tablet, Take 1 tablet (10 mg total) by mouth at bedtime., Disp: 90 tablet, Rfl: 0   levocetirizine (XYZAL) 5 MG tablet, Take 1 tablet (5 mg total) by mouth every evening., Disp: 90 tablet, Rfl: 1   levothyroxine (SYNTHROID) 75 MCG tablet, TAKE 1 TABLET ONCE DAILY BEFORE BREAKFAST ON MONDAY THROUGH FRIDAY. SKIP DOSE ON WEEKENDS, Disp: 65 tablet, Rfl: 1   lidocaine (LIDODERM) 5 %, Place 2 patches onto the skin daily. (remove after 12 hours), Disp: 180 patch, Rfl: 1   lisinopril (ZESTRIL) 20 MG tablet, Take 1 tablet (20 mg total) by mouth daily., Disp: 90 tablet, Rfl: 1   omeprazole (PRILOSEC) 20 MG capsule, Take 1 capsule (20 mg total) by  mouth daily., Disp: 90 capsule, Rfl: 1   polyethylene glycol powder (GLYCOLAX/MIRALAX) 17 GM/SCOOP powder, Take 17 g by mouth daily with breakfast., Disp: 3350 g, Rfl: 1  Allergies  Allergen Reactions   Lyrica [Pregabalin]     " I felt groggy"    I personally reviewed active problem list, medication list, allergies, family history, social history, health maintenance with the patient/caregiver today.   ROS  Ten systems reviewed and is negative except as mentioned in HPI   Objective  Vitals:   08/10/22 1450  BP: 114/74  Pulse: 76  Resp: 14  Temp: 97.9 F (36.6 C)  TempSrc: Oral  SpO2: 100%  Weight: 126 lb 3.2 oz (57.2 kg)  Height: 5\' 3"  (1.6 m)    Body mass index is 22.36 kg/m.  Physical Exam  Constitutional: Patient appear petite  No distress.  HEENT: head atraumatic, normocephalic, pupils equal and reactive to light, neck supple Cardiovascular: Normal rate, regular rhythm and normal heart sounds.  No murmur heard. No BLE edema. Pulmonary/Chest: Effort normal and breath sounds normal. No respiratory distress. Abdominal: Soft.  There is no tenderness. Psychiatric: cooperative but unable to give  history, smiling    PHQ2/9:    08/10/2022    2:53 PM 05/09/2022    3:07 PM 01/04/2022    3:06 PM 10/01/2021    2:08 PM 09/28/2021    2:28 PM  Depression screen PHQ 2/9  Decreased Interest 2 1 1  0 0  Down, Depressed, Hopeless 2 1 3 1 1   PHQ - 2 Score 4 2 4 1 1   Altered sleeping 2 1 0 0 0  Tired, decreased energy 1 1 0 0 0  Change in appetite 0 0 0 0 0  Feeling bad or failure about yourself  1 0 1 1 1   Trouble concentrating 3 3 0 0 0  Moving slowly or fidgety/restless 0 0 0 0 0  Suicidal thoughts 1 0 0 0 0  PHQ-9 Score 12 7 5 2 2   Difficult doing work/chores Somewhat difficult Not difficult at all   Not difficult at all    phq 9 is filled out by daughter    Fall Risk:    08/10/2022    2:51 PM 05/09/2022    3:06 PM 01/04/2022    3:06 PM 10/01/2021    2:08 PM 09/28/2021    2:30 PM  Fall Risk   Falls in the past year? 1 0 1 1 1   Number falls in past yr: 0  1 1 1   Injury with Fall? 0  0 0 0  Risk for fall due to : History of fall(s);Impaired balance/gait Impaired balance/gait Impaired balance/gait;Impaired mobility Impaired balance/gait History of fall(s);Impaired balance/gait  Follow up Falls prevention discussed;Education provided;Falls evaluation completed Falls prevention discussed;Education provided;Falls evaluation completed Falls prevention discussed Falls prevention discussed Falls prevention discussed      Functional Status Survey: Is the patient deaf or have difficulty hearing?: Yes Does the patient have difficulty seeing, even when wearing glasses/contacts?: Yes Does the patient have difficulty concentrating, remembering, or making decisions?: Yes Does the patient have difficulty walking or climbing stairs?: No Does the patient have difficulty dressing or bathing?: No Does the patient have difficulty doing errands alone such as visiting a doctor's office or shopping?: No    Assessment & Plan  1. Need for immunization against influenza  - Flu Vaccine QUAD  High Dose(Fluad)  2. Pancytopenia ([place])  - CBC with Differential/Platelet  3. Stage 3a chronic kidney  disease ([place])  Stable   4. Atherosclerosis of abdominal aorta ([facility])  Refuses statin therapy  5. Secondary renal hyperparathyroidism ([place])  Refuses seeing nephrologist   6. Superior mesenteric artery stenosis ([facility])  Not on medication, too frail for eliquis and refuses statin therapy  7. Mild major depression ([place])  Per daughter she is stable, has some hallucinations, some days are good and others bad, but does not like taking zoloft   8. Benign hypertension with chronic kidney disease, stage III (HCC)  Bp towards low end of normal, but elevated during EC visit and at home, advised daughter to bring her bp cuff to calibrate next visit, also advised to bring all medications, seems like not taking daily since last rx was sent earlier this year with one one refill   9. Acquired hypothyroidism  - TSH

## 2022-08-10 ENCOUNTER — Ambulatory Visit: Payer: Medicare PPO | Admitting: Family Medicine

## 2022-08-10 ENCOUNTER — Encounter: Payer: Self-pay | Admitting: Family Medicine

## 2022-08-10 VITALS — BP 114/74 | HR 76 | Temp 97.9°F | Resp 14 | Ht 63.0 in | Wt 126.2 lb

## 2022-08-10 DIAGNOSIS — F09 Unspecified mental disorder due to known physiological condition: Secondary | ICD-10-CM

## 2022-08-10 DIAGNOSIS — I129 Hypertensive chronic kidney disease with stage 1 through stage 4 chronic kidney disease, or unspecified chronic kidney disease: Secondary | ICD-10-CM | POA: Diagnosis not present

## 2022-08-10 DIAGNOSIS — N1831 Chronic kidney disease, stage 3a: Secondary | ICD-10-CM | POA: Diagnosis not present

## 2022-08-10 DIAGNOSIS — N2581 Secondary hyperparathyroidism of renal origin: Secondary | ICD-10-CM

## 2022-08-10 DIAGNOSIS — N183 Chronic kidney disease, stage 3 unspecified: Secondary | ICD-10-CM

## 2022-08-10 DIAGNOSIS — F320 Major depressive disorder, single episode, mild: Secondary | ICD-10-CM

## 2022-08-10 DIAGNOSIS — K8689 Other specified diseases of pancreas: Secondary | ICD-10-CM

## 2022-08-10 DIAGNOSIS — I700 Atherosclerosis of aorta: Secondary | ICD-10-CM | POA: Diagnosis not present

## 2022-08-10 DIAGNOSIS — K551 Chronic vascular disorders of intestine: Secondary | ICD-10-CM | POA: Diagnosis not present

## 2022-08-10 DIAGNOSIS — E039 Hypothyroidism, unspecified: Secondary | ICD-10-CM | POA: Diagnosis not present

## 2022-08-10 DIAGNOSIS — D61818 Other pancytopenia: Secondary | ICD-10-CM

## 2022-08-10 DIAGNOSIS — Z23 Encounter for immunization: Secondary | ICD-10-CM

## 2022-08-11 LAB — CBC WITH DIFFERENTIAL/PLATELET
Absolute Monocytes: 198 cells/uL — ABNORMAL LOW (ref 200–950)
Basophils Absolute: 21 cells/uL (ref 0–200)
Basophils Relative: 0.9 %
Eosinophils Absolute: 21 cells/uL (ref 15–500)
Eosinophils Relative: 0.9 %
HCT: 31.6 % — ABNORMAL LOW (ref 35.0–45.0)
Hemoglobin: 10.1 g/dL — ABNORMAL LOW (ref 11.7–15.5)
Lymphs Abs: 731 cells/uL — ABNORMAL LOW (ref 850–3900)
MCH: 29.5 pg (ref 27.0–33.0)
MCHC: 32 g/dL (ref 32.0–36.0)
MCV: 92.4 fL (ref 80.0–100.0)
MPV: 10.6 fL (ref 7.5–12.5)
Monocytes Relative: 8.6 %
Neutro Abs: 1329 cells/uL — ABNORMAL LOW (ref 1500–7800)
Neutrophils Relative %: 57.8 %
Platelets: 139 10*3/uL — ABNORMAL LOW (ref 140–400)
RBC: 3.42 10*6/uL — ABNORMAL LOW (ref 3.80–5.10)
RDW: 13.1 % (ref 11.0–15.0)
Total Lymphocyte: 31.8 %
WBC: 2.3 10*3/uL — ABNORMAL LOW (ref 3.8–10.8)

## 2022-08-11 LAB — TSH: TSH: 15.85 mIU/L — ABNORMAL HIGH (ref 0.40–4.50)

## 2022-08-12 ENCOUNTER — Other Ambulatory Visit: Payer: Self-pay | Admitting: Family Medicine

## 2022-08-12 ENCOUNTER — Telehealth: Payer: Self-pay | Admitting: Family Medicine

## 2022-08-12 MED ORDER — LEVOTHYROXINE SODIUM 88 MCG PO TABS: 88.0000 ug | ORAL_TABLET | Freq: Every day | ORAL | 0 refills | Status: DC

## 2022-08-12 NOTE — Telephone Encounter (Signed)
Daughter) [name] [phone] is calling to report that she is taking the medication in the morning with water.

## 2022-08-22 ENCOUNTER — Telehealth: Payer: Medicare PPO | Admitting: Nurse Practitioner

## 2022-08-22 ENCOUNTER — Encounter: Payer: Self-pay | Admitting: Nurse Practitioner

## 2022-08-22 DIAGNOSIS — R5381 Other malaise: Secondary | ICD-10-CM

## 2022-08-22 DIAGNOSIS — Z515 Encounter for palliative care: Secondary | ICD-10-CM

## 2022-08-22 DIAGNOSIS — F02818 Dementia in other diseases classified elsewhere, unspecified severity, with other behavioral disturbance: Secondary | ICD-10-CM

## 2022-08-22 DIAGNOSIS — F419 Anxiety disorder, unspecified: Secondary | ICD-10-CM | POA: Diagnosis not present

## 2022-08-22 NOTE — Progress Notes (Signed)
    [facility] Consult Note Telephone: [phone]  Fax: [phone]    Date of encounter: 08/22/22 10:06 PM PATIENT NAME: Lori Pacheco [id number]   [phone] (home)  DOB: 16-Jul-1921 MRN: [id number] PRIMARY CARE PROVIDER:    Steele Sizer, MD,  [address] [place] [address] [phone]  REFERRING PROVIDER:   Steele Sizer, MD [address] [place],  [place] [address] [phone]  RESPONSIBLE PARTY:    Contact Information     Name Relation Home Work [place] Daughter [phone]  [phone]   Kirstie Mirza Daughter [phone]  [phone]      Due to the COVID-19 crisis, this visit was done via telephone as video is available for telemedicine from my office and it was initiated and consent by this patient and or family.   I connected [name] daughter and KRISANNE LICH OR PROXY on 01/11/22 by telephone as video is not available verified that I am speaking with the correct person.  Palliative Care was asked to follow this patient by consultation request of  Steele Sizer, MD to address advance care planning and complex medical decision making. This is a follow up visit ASSESSMENT AND PLAN / RECOMMENDATIONS:  Symptom Management/Plan: ACP: DNR   2. Anxiety secondary to dementia; stable; continue to monitor mood. Discussed, continue celexa.    3.Debility secondary to Dementia: dementia progressive currently stable at present time. Continue to monitor weights, appetite, encourage Lori Pacheco to use a walker or cane with ambulation, stress fall precautions.   I spent 31 minutes providing this consultation. More than 50% of the time in this consultation was spent in counseling and care coordination. PPS: 50% Chief Complaint: Follow up palliative consult for complex medical decision making   HISTORY OF PRESENT ILLNESS:  Lori Pacheco is a 88  y.o. year old female  with multiple medical problems including monoclonal gammopathy of undetermined significance (MGUS), Chronic back pain, Depression, HLD, HTN, CKD 3, Hypothyroidism, Hx of PE on Eliquis. I connected with [name] by telephone as video not available for f/u pc visit. [name] put on conference so Ms. Six could participate. We talked about how Lori Pacheco has been feeling. Lori Pacheco endorses she is feeling good. ROS, no symptoms or complaints. Lori Pacheco talked about her appetite being good. No noticed weight loss or change in clothes. We talked about her daily routine. No recent wounds, falls, infections, hospitalizations. We talked about sleeping well. Lori Pacheco repeated multiple times, no place like home, she lives with her daughter. We talked about appetite, no new changes, continues to be stable. Will sign off as PC NP with PC RN/SW to continue to follow due to program change. [name] with Lori Pacheco in agreement with poc. Medical goals reviewed.   We talked about medical goals, PC with f/u visits scheduled. Emotional support provided, Questions answered.    History obtained from review of EMR, discussion with Daughter [name] with Ms. Eifert.  I reviewed available labs, medications, imaging, studies and related documents from the EMR.  Records reviewed and summarized above.   ROS 10 point system reviewed all negative except HPI  Physical Exam: deferred Thank you for the opportunity to participate in the care of Lori Pacheco. Please call our office at [phone] if we can be of additional assistance.   Cythina Mickelsen Ihor Gully, NP

## 2022-08-24 ENCOUNTER — Telehealth: Payer: Medicare PPO | Admitting: Nurse Practitioner

## 2022-10-30 ENCOUNTER — Other Ambulatory Visit: Payer: Self-pay | Admitting: Family Medicine

## 2022-10-30 DIAGNOSIS — I10 Essential (primary) hypertension: Secondary | ICD-10-CM

## 2022-10-30 DIAGNOSIS — N1831 Chronic kidney disease, stage 3a: Secondary | ICD-10-CM

## 2022-10-30 DIAGNOSIS — I129 Hypertensive chronic kidney disease with stage 1 through stage 4 chronic kidney disease, or unspecified chronic kidney disease: Secondary | ICD-10-CM

## 2022-10-31 ENCOUNTER — Other Ambulatory Visit: Payer: Self-pay

## 2022-10-31 DIAGNOSIS — I10 Essential (primary) hypertension: Secondary | ICD-10-CM

## 2022-10-31 DIAGNOSIS — N1831 Chronic kidney disease, stage 3a: Secondary | ICD-10-CM

## 2022-10-31 DIAGNOSIS — I129 Hypertensive chronic kidney disease with stage 1 through stage 4 chronic kidney disease, or unspecified chronic kidney disease: Secondary | ICD-10-CM

## 2022-10-31 MED ORDER — AMLODIPINE BESYLATE 10 MG PO TABS: 10.0000 mg | ORAL_TABLET | Freq: Every day | ORAL | 0 refills | Status: DC

## 2022-10-31 MED ORDER — LISINOPRIL 20 MG PO TABS: 20.0000 mg | ORAL_TABLET | Freq: Every day | ORAL | 0 refills | Status: DC

## 2022-11-06 ENCOUNTER — Other Ambulatory Visit: Payer: Self-pay | Admitting: Family Medicine

## 2022-11-10 ENCOUNTER — Ambulatory Visit: Payer: Medicare PPO | Admitting: Nurse Practitioner

## 2022-11-10 NOTE — Progress Notes (Signed)
Name: Lori Pacheco   MRN: [id number]    DOB: 12/16/1920   Date:11/11/2022       Progress Note  Subjective  Chief Complaint  Follow Up  HPI  Patient came in today with her daughter Lori Pacheco -lives next door and brought her in today-  patient's youngest daughter - Lori Pacheco - lives with the patient )  , Ms. Biddinger and daughters are aware of her advanced age and multiple medical problems that are not managed such as CKI and also MGUS and increase in cognitive dysfunction. She is now having palliative care by [facility] visiting her once a month She is DNR    She used to have a lot of pruritis but doing well lately. She has hydroxyzine at home and daughter has been giving it to her at night due to agitation, we will switch that to Seroquel, may use hydroxizine for itching or prn agitation during the day    CKI stage III with secondary hyperparathyroidism: not willing to see nephrologist at this time, last GFR was still low and high parathyroid level, they finally stopped giving her vitamin D ( it was high ) and we will recheck it today   CT abdomen done [facility] in 2015   1.  No evidence of bowel ischemia. 2.  Prominent calcified atheromatous plaque at the origin of the celiac artery and SMA resulting in mild-moderate stenosis, as above, but the distal branches remain patent. 3.  Small amount of free fluid in the pelvis of unknown etiology.    Repeat CT abdomen at [facility] 01/2020   1. Right lower lobe pulmonary embolism. This was discussed with the ordering ER physician. 2. 3.3 x 1.1 cm hypoattenuating lesion within the pancreatic head, favored to represent a sidebranch IPMN. In the setting of advanced age, no definite follow-up is necessary, however, this may be further evaluated with MRI/MRCP as clinically desired. 3. Small-volume abdominal pelvic ascites with diffuse mesenteric edema. Findings are nonspecific without identifiable cause by CT. 4. No evidence of bowel obstruction as clinically questioned.    DVT left leg: she had DVT on left leg, also history of PE. Marland Kitchen No calf tenderness or swelling.  She is off Eliquis due to her age, recurrent falls and risk of bleeding , no problems with pain or swelling at this time     Atherosclerosis of aorta/ she also has mesenteric artery stenosis: she is no longer taking eliquis and refuses statin therapy, she has intermittent abdominal pain after meals , eating small amounts, weight has been stable. She is under palliative care   Pancreatic mass: found on CT abdomen, daughter and patient are aware of the findings of pancreatic mass on CT done at [facility] showed a mass, she denies any recent abdominal pain. Family agrees she would not tolerate surgery due to advance age They continue to agree with current regiment and palliative care    CT result :  PANCREAS: Lobulated hypoattenuating lesion within the pancreatic head measuring 3.3 x 1.1 cm (4:26). Top differential consideration would include a side branch IPMN.   Constipation: she is doing better now that she has been taking miralax daily , mixed with her coffee  , doing well since she has been compliant with miralax It seems like the pain has decreased with normal bowel movements -  continue current regiment    Hypothyroidism: she is on levothyroxine 88 mcg 5 days a week. We will recheck TSH today and adjust medication as needed. She has chronic constipation  that is stable.   Pancytopenia She used to go to the cancer center but no longer due to not wanting treatment or monitoring.Reviewed last labs with daughter and patient   Malnutrition: her weight is stable now, she eats very little and has temporal waisting   Vascular Dementia with anxiety and sundowning: daughter states she is having more agitation at the end of the day, : she seems to be hallucinating intermittently , she talks to people that have already died, sometimes she says that people have been in the house, but not afraid, she does not harm self or  others  Daughter agrees on adding seroquel at night   Right flank pain/neuropathy: responds to lidoderm patch but she only uses it prn Doing well at this time. Occasionally still complains of pain but no longer going to [facility], unchanged   Patient Active Problem List   Diagnosis Date Noted   Mild vascular dementia with anxiety ([place]) 11/11/2022   Pancreatic mass 08/10/2022   History of recent fall 05/10/2022   Gastroesophageal reflux disease without esophagitis 05/10/2022   Chronic pain syndrome 05/09/2022   Palliative care patient 05/09/2022   Neuropathic pain of flank, right 05/09/2022   Benign hypertension with chronic kidney disease, stage III ([place]) 05/09/2022   Mild major depression ([place]) 05/09/2022   Stage 3a chronic kidney disease ([place]) 05/09/2022   Pancytopenia ([place]) 12/15/2020   Superior mesenteric artery stenosis ([place]) 03/02/2020   Stage 3b chronic kidney disease ([place]) 03/02/2020   History of pulmonary embolism 02/07/2020   Chronic abdominal pain 02/07/2020   Depression with anxiety 02/07/2020   Cognitive dysfunction 12/25/2018   Varicose veins of leg with pain, bilateral 12/04/2018   History of DVT (deep vein thrombosis) 10/17/2018   Other constipation 04/23/2018   Leg cramps 04/23/2018   Thrombocytopenia ([place]) 05/15/2017   Secondary renal hyperparathyroidism ([place]) 05/15/2017   Atherosclerosis of abdominal aorta ([place]) 11/10/2016   Perennial allergic rhinitis with seasonal variation 11/10/2016   Vitamin D deficiency 03/29/2016   Hypothyroidism 09/18/2015   Essential hypertension 05/19/2015   Hyperlipemia 05/19/2015    Past Surgical History:  Procedure Laterality Date   NO PAST SURGERIES      Family History  Problem Relation Age of Onset   Healthy Mother    Healthy Father    Heart disease Brother    Cancer Brother    Cancer Daughter    Alcohol abuse Son    Diabetes Son    Heart disease Son     Social History   Tobacco Use   Smoking status: Never    Smokeless tobacco: Former    Types: Snuff    Quit date: 09/27/1986   Tobacco comments:    smoking cessation materials not required  Substance Use Topics   Alcohol use: No    Alcohol/week: 0.0 standard drinks of alcohol     Current Outpatient Medications:    acetaminophen (TYLENOL) 500 MG tablet, Take 1 tablet (500 mg total) by mouth every 6 (six) hours as needed., Disp: 100 tablet, Rfl: 0   Blood Glucose-BP Monitor (BLOOD GLUCOSE-WRIST BP MONITOR) DEVI, 1 each by Does not apply route daily., Disp: 1 each, Rfl: 0   hydrOXYzine (ATARAX) 10 MG tablet, Take 1 tablet (10 mg total) by mouth at bedtime., Disp: 90 tablet, Rfl: 0   levothyroxine (SYNTHROID) 88 MCG tablet, TAKE 1 TABLET BY MOUTH ONCE DAILY MONDAYS  THROUGH  FRIDAYS  ONLY, Disp: 84 tablet, Rfl: 0   lidocaine (LIDODERM) 5 %, Place 2  patches onto the skin daily. (remove after 12 hours), Disp: 180 patch, Rfl: 1   polyethylene glycol powder (GLYCOLAX/MIRALAX) 17 GM/SCOOP powder, Take 17 g by mouth daily with breakfast., Disp: 3350 g, Rfl: 1   QUEtiapine (SEROQUEL) 25 MG tablet, Take 1 tablet (25 mg total) by mouth at bedtime., Disp: 90 tablet, Rfl: 1   amLODipine (NORVASC) 10 MG tablet, Take 1 tablet (10 mg total) by mouth daily., Disp: 90 tablet, Rfl: 1   lisinopril (ZESTRIL) 20 MG tablet, Take 1 tablet (20 mg total) by mouth daily., Disp: 90 tablet, Rfl: 1  Allergies  Allergen Reactions   Lyrica [Pregabalin]     " I felt groggy"    I personally reviewed active problem list, medication list, allergies, family history, social history, health maintenance with the patient/caregiver today.   ROS  Ten systems reviewed and is negative except as mentioned in HPI   Objective  Vitals:   11/11/22 1508 11/11/22 1515 11/11/22 1527  BP: (!) 166/76 (!) 162/74 (!) 156/70  Pulse: 80    Resp: 16    SpO2: 98%    Weight: 125 lb (56.7 kg)    Height: 5\' 3"  (1.6 m)      Body mass index is 22.14 kg/m.  Physical Exam  Constitutional:  Patient appears well-developed with temporal waisting  No distress HEENT: head atraumatic, normocephalic, pupils equal and reactive to light, neck supple throat within normal limits Cardiovascular: Normal rate, regular rhythm and normal heart sounds.  No murmur heard. No BLE edema. Pulmonary/Chest: Effort normal and breath sounds normal. No respiratory distress. Abdominal: Soft.  There is no tenderness. Psychiatric: Patient has a normal mood and affect. Cooperative, smiling  PHQ2/9:    11/11/2022    3:14 PM 08/10/2022    2:53 PM 05/09/2022    3:07 PM 01/04/2022    3:06 PM 10/01/2021    2:08 PM  Depression screen PHQ 2/9  Decreased Interest 0 2 1 1  0  Down, Depressed, Hopeless 0 2 1 3 1   PHQ - 2 Score 0 4 2 4 1   Altered sleeping 3 2 1  0 0  Tired, decreased energy 0 1 1 0 0  Change in appetite 0 0 0 0 0  Feeling bad or failure about yourself  0 1 0 1 1  Trouble concentrating 0 3 3 0 0  Moving slowly or fidgety/restless 0 0 0 0 0  Suicidal thoughts 0 1 0 0 0  PHQ-9 Score 3 12 7 5 2   Difficult doing work/chores  Somewhat difficult Not difficult at all      phq 9 is negative   Fall Risk:    11/11/2022    3:14 PM 08/10/2022    2:51 PM 05/09/2022    3:06 PM 01/04/2022    3:06 PM 10/01/2021    2:08 PM  Fall Risk   Falls in the past year? 0 1 0 1 1  Number falls in past yr: 0 0  1 1  Injury with Fall? 0 0  0 0  Risk for fall due to : Impaired balance/gait History of fall(s);Impaired balance/gait Impaired balance/gait Impaired balance/gait;Impaired mobility Impaired balance/gait  Follow up Falls prevention discussed Falls prevention discussed;Education provided;Falls evaluation completed Falls prevention discussed;Education provided;Falls evaluation completed Falls prevention discussed Falls prevention discussed      Functional Status Survey: Is the patient deaf or have difficulty hearing?: Yes Does the patient have difficulty seeing, even when wearing glasses/contacts?: Yes Does  the patient have difficulty concentrating,  remembering, or making decisions?: Yes Does the patient have difficulty walking or climbing stairs?: No Does the patient have difficulty dressing or bathing?: No Does the patient have difficulty doing errands alone such as visiting a doctor's office or shopping?: Yes    Assessment & Plan  1. Stage 3a chronic kidney disease (HCC)  - CBC with Differential/Platelet - COMPLETE METABOLIC PANEL WITH GFR - lisinopril (ZESTRIL) 20 MG tablet; Take 1 tablet (20 mg total) by mouth daily.  Dispense: 90 tablet; Refill: 1  2. Pancytopenia ([place])  - CBC with Differential/Platelet  3. Atherosclerosis of abdominal aorta (HCC)  Not on statin due to age  - refuses  4. Secondary renal hyperparathyroidism ([place])   5. Superior mesenteric artery stenosis (HCC)  Stable, she has intermittent abdominal pain, no longer on statin or aspirin due to her age and risk of falls  6. Benign hypertension with chronic kidney disease, stage III (HCC)  - amLODipine (NORVASC) 10 MG tablet; Take 1 tablet (10 mg total) by mouth daily.  Dispense: 90 tablet; Refill: 1 - lisinopril (ZESTRIL) 20 MG tablet; Take 1 tablet (20 mg total) by mouth daily.  Dispense: 90 tablet; Refill: 1  7. Mild vascular dementia with anxiety (HCC)  Adding seroquel   8. Mild protein-calorie malnutrition ([place])  Discussed protein supplementation   9. Pancreatic mass   10. Gastroesophageal reflux disease without esophagitis  Stopped PPI on her own and seems to be doing okay  11. Neuropathic pain of flank, right  Using lidoderm patch prn   12. Palliative care patient   13. Chronic pain syndrome   14. High vitamin D level  - VITAMIN D 25 Hydroxy (Vit-D Deficiency, Fractures)  15. Acquired hypothyroidism  - TSH  16. Sundowning  - QUEtiapine (SEROQUEL) 25 MG tablet; Take 1 tablet (25 mg total) by mouth at bedtime.  Dispense: 90 tablet; Refill: 1  17. Essential hypertension  -  amLODipine (NORVASC) 10 MG tablet; Take 1 tablet (10 mg total) by mouth daily.  Dispense: 90 tablet; Refill: 1 - lisinopril (ZESTRIL) 20 MG tablet; Take 1 tablet (20 mg total) by mouth daily.  Dispense: 90 tablet; Refill: 1

## 2022-11-11 ENCOUNTER — Encounter: Payer: Self-pay | Admitting: Family Medicine

## 2022-11-11 ENCOUNTER — Ambulatory Visit: Payer: Medicare PPO | Admitting: Family Medicine

## 2022-11-11 VITALS — BP 156/70 | HR 80 | Resp 16 | Ht 63.0 in | Wt 125.0 lb

## 2022-11-11 DIAGNOSIS — D61818 Other pancytopenia: Secondary | ICD-10-CM | POA: Diagnosis not present

## 2022-11-11 DIAGNOSIS — N1831 Chronic kidney disease, stage 3a: Secondary | ICD-10-CM | POA: Diagnosis not present

## 2022-11-11 DIAGNOSIS — M792 Neuralgia and neuritis, unspecified: Secondary | ICD-10-CM

## 2022-11-11 DIAGNOSIS — I129 Hypertensive chronic kidney disease with stage 1 through stage 4 chronic kidney disease, or unspecified chronic kidney disease: Secondary | ICD-10-CM

## 2022-11-11 DIAGNOSIS — E673 Hypervitaminosis D: Secondary | ICD-10-CM | POA: Diagnosis not present

## 2022-11-11 DIAGNOSIS — F01A4 Vascular dementia, mild, with anxiety: Secondary | ICD-10-CM | POA: Diagnosis not present

## 2022-11-11 DIAGNOSIS — K551 Chronic vascular disorders of intestine: Secondary | ICD-10-CM

## 2022-11-11 DIAGNOSIS — I10 Essential (primary) hypertension: Secondary | ICD-10-CM

## 2022-11-11 DIAGNOSIS — I700 Atherosclerosis of aorta: Secondary | ICD-10-CM

## 2022-11-11 DIAGNOSIS — G894 Chronic pain syndrome: Secondary | ICD-10-CM

## 2022-11-11 DIAGNOSIS — F05 Delirium due to known physiological condition: Secondary | ICD-10-CM

## 2022-11-11 DIAGNOSIS — E441 Mild protein-calorie malnutrition: Secondary | ICD-10-CM | POA: Diagnosis not present

## 2022-11-11 DIAGNOSIS — Z515 Encounter for palliative care: Secondary | ICD-10-CM

## 2022-11-11 DIAGNOSIS — N2581 Secondary hyperparathyroidism of renal origin: Secondary | ICD-10-CM | POA: Diagnosis not present

## 2022-11-11 DIAGNOSIS — N183 Chronic kidney disease, stage 3 unspecified: Secondary | ICD-10-CM

## 2022-11-11 DIAGNOSIS — K8689 Other specified diseases of pancreas: Secondary | ICD-10-CM

## 2022-11-11 DIAGNOSIS — E039 Hypothyroidism, unspecified: Secondary | ICD-10-CM

## 2022-11-11 DIAGNOSIS — K219 Gastro-esophageal reflux disease without esophagitis: Secondary | ICD-10-CM

## 2022-11-11 MED ORDER — QUETIAPINE FUMARATE 25 MG PO TABS: 25.0000 mg | ORAL_TABLET | Freq: Every day | ORAL | 1 refills | Status: DC

## 2022-11-11 MED ORDER — AMLODIPINE BESYLATE 10 MG PO TABS: 10.0000 mg | ORAL_TABLET | Freq: Every day | ORAL | 1 refills | Status: DC

## 2022-11-11 MED ORDER — LISINOPRIL 20 MG PO TABS: 20.0000 mg | ORAL_TABLET | Freq: Every day | ORAL | 1 refills | Status: DC

## 2022-11-12 LAB — COMPLETE METABOLIC PANEL WITH GFR
AG Ratio: 1.5 (calc) (ref 1.0–2.5)
ALT: 8 U/L (ref 6–29)
AST: 15 U/L (ref 10–35)
Albumin: 4 g/dL (ref 3.6–5.1)
Alkaline phosphatase (APISO): 62 U/L (ref 37–153)
BUN/Creatinine Ratio: 19 (calc) (ref 6–22)
BUN: 31 mg/dL — ABNORMAL HIGH (ref 7–25)
CO2: 26 mmol/L (ref 20–32)
Calcium: 9.2 mg/dL (ref 8.6–10.4)
Chloride: 106 mmol/L (ref 98–110)
Creat: 1.59 mg/dL — ABNORMAL HIGH (ref 0.60–0.95)
Globulin: 2.7 g/dL (calc) (ref 1.9–3.7)
Glucose, Bld: 89 mg/dL (ref 65–99)
Potassium: 4.5 mmol/L (ref 3.5–5.3)
Sodium: 143 mmol/L (ref 135–146)
Total Bilirubin: 0.4 mg/dL (ref 0.2–1.2)
Total Protein: 6.7 g/dL (ref 6.1–8.1)
eGFR: 29 mL/min/{1.73_m2} — ABNORMAL LOW (ref >=60)

## 2022-11-12 LAB — CBC WITH DIFFERENTIAL/PLATELET
Absolute Monocytes: 232 cells/uL (ref 200–950)
Basophils Absolute: 22 cells/uL (ref 0–200)
Basophils Relative: 0.8 %
Eosinophils Absolute: 30 cells/uL (ref 15–500)
Eosinophils Relative: 1.1 %
HCT: 32.5 % — ABNORMAL LOW (ref 35.0–45.0)
Hemoglobin: 10.7 g/dL — ABNORMAL LOW (ref 11.7–15.5)
Lymphs Abs: 1126 cells/uL (ref 850–3900)
MCH: 29.5 pg (ref 27.0–33.0)
MCHC: 32.9 g/dL (ref 32.0–36.0)
MCV: 89.5 fL (ref 80.0–100.0)
MPV: 11.2 fL (ref 7.5–12.5)
Monocytes Relative: 8.6 %
Neutro Abs: 1291 cells/uL — ABNORMAL LOW (ref 1500–7800)
Neutrophils Relative %: 47.8 %
Platelets: 145 10*3/uL (ref 140–400)
RBC: 3.63 10*6/uL — ABNORMAL LOW (ref 3.80–5.10)
RDW: 12.6 % (ref 11.0–15.0)
Total Lymphocyte: 41.7 %
WBC: 2.7 10*3/uL — ABNORMAL LOW (ref 3.8–10.8)

## 2022-11-12 LAB — VITAMIN D 25 HYDROXY (VIT D DEFICIENCY, FRACTURES): Vit D, 25-Hydroxy: 66 ng/mL (ref 30–100)

## 2022-11-12 LAB — TSH: TSH: 2.5 mIU/L (ref 0.40–4.50)

## 2022-11-14 ENCOUNTER — Other Ambulatory Visit: Payer: Self-pay | Admitting: Family Medicine

## 2023-02-10 NOTE — Progress Notes (Unsigned)
Name: Lori Pacheco   MRN: [id number]    DOB: 29-Jan-1921   Date:02/13/2023       Progress Note  Subjective  Chief Complaint  Follow Up  HPI  Patient came in today with her daughter [name] -lives next door and usually is the one that brings her to our office -  patient's youngest daughter - [name] - lives with the patient ). Ms. Giacobbe and daughters are aware of her advanced age and multiple medical problems that are not managed such as CKI and also MGUS and increase in cognitive dysfunction. She is now having palliative care by [facility] visiting her once a month She is DNR . She is now hiding the food that she does not want to eat    She used to have a lot of pruritis but doing well lately. She has hydroxyzine at home but not using it lately    CKI stage III with secondary hyperparathyroidism: not willing to see nephrologist at this time, last GFR was still low and high parathyroid level, they finally stopped giving her vitamin D ( it was high ) and we will recheck it today   CT abdomen done [facility] in 2015   1.  No evidence of bowel ischemia. 2.  Prominent calcified atheromatous plaque at the origin of the celiac artery and SMA resulting in mild-moderate stenosis, as above, but the distal branches remain patent. 3.  Small amount of free fluid in the pelvis of unknown etiology.    Repeat CT abdomen at [facility] 01/2020   1. Right lower lobe pulmonary embolism. This was discussed with the ordering ER physician. 2. 3.3 x 1.1 cm hypoattenuating lesion within the pancreatic head, favored to represent a sidebranch IPMN. In the setting of advanced age, no definite follow-up is necessary, however, this may be further evaluated with MRI/MRCP as clinically desired. 3. Small-volume abdominal pelvic ascites with diffuse mesenteric edema. Findings are nonspecific without identifiable cause by CT. 4. No evidence of bowel obstruction as clinically questioned.   DVT left leg: she had DVT on left leg, also history  of PE. Marland Kitchen No calf tenderness or swelling.  She is off Eliquis due to her age, recurrent falls and risk of bleeding , no problems with pain or swelling at this time Unchanged     Atherosclerosis of aorta/ she also has mesenteric artery stenosis: she is no longer taking eliquis and refuses statin therapy, she has intermittent abdominal pain after meals , eating small amounts, weight is down. She is under palliative   Pancreatic mass: found on CT abdomen, daughter and patient are aware of the findings of pancreatic mass on CT done at [facility] showed a mass, she denies any recent abdominal pain. Family agrees she would not tolerate surgery due to advance age They continue to agree with current regiment and palliative care    CT result :  PANCREAS: Lobulated hypoattenuating lesion within the pancreatic head measuring 3.3 x 1.1 cm (4:26). Top differential consideration would include a side branch IPMN.   Constipation: she is doing better now that she has been taking miralax daily , mixed with her coffee  , but now she is no longer drinking coffee because she thinks her daughter is putting alcohol in her drink. Explained it can be mixed with juice without her seeing it    Hypothyroidism: she is on levothyroxine 88 mcg 5 days a week. She is doing well, last level was at goal   Pancytopenia She used to go  to the cancer center but no longer due to not wanting treatment or monitoring. Unchanged   Malnutrition: her weight is going down again. She is wrapping the food she is not eating and hiding around the house.   Vascular Dementia with anxiety and sundowning: daughter states she is having more agitation at the end of the day, : she seems to be hallucinating intermittently , she talks to people that have already died, now hiding dirty underwear and food , sometimes she says that people have been in the house, but not afraid, she does not harm self or others  She is still pacing at night time  , I gave her Quetiapine  on her last visit but daughter said she did not pick it up yet   Right flank pain/neuropathy: responds to lidoderm patch but she only uses it prn Doing well at this time. Occasionally still complains of pain but no longer going to [facility]. Stable   Patient Active Problem List   Diagnosis Date Noted   Mild vascular dementia with anxiety (HCC) 11/11/2022   Pancreatic mass 08/10/2022   History of recent fall 05/10/2022   Gastroesophageal reflux disease without esophagitis 05/10/2022   Chronic pain syndrome 05/09/2022   Palliative care patient 05/09/2022   Neuropathic pain of flank, right 05/09/2022   Benign hypertension with chronic kidney disease, stage III (HCC) 05/09/2022   Mild major depression (HCC) 05/09/2022   Stage 3a chronic kidney disease (HCC) 05/09/2022   Pancytopenia (HCC) 12/15/2020   Superior mesenteric artery stenosis (HCC) 03/02/2020   Stage 3b chronic kidney disease (HCC) 03/02/2020   History of pulmonary embolism 02/07/2020   Chronic abdominal pain 02/07/2020   Depression with anxiety 02/07/2020   Cognitive dysfunction 12/25/2018   Varicose veins of leg with pain, bilateral 12/04/2018   History of DVT (deep vein thrombosis) 10/17/2018   Other constipation 04/23/2018   Leg cramps 04/23/2018   Thrombocytopenia (HCC) 05/15/2017   Secondary renal hyperparathyroidism (HCC) 05/15/2017   Atherosclerosis of abdominal aorta (HCC) 11/10/2016   Perennial allergic rhinitis with seasonal variation 11/10/2016   Vitamin D deficiency 03/29/2016   Hypothyroidism 09/18/2015   Essential hypertension 05/19/2015   Hyperlipemia 05/19/2015    Past Surgical History:  Procedure Laterality Date   NO PAST SURGERIES      Family History  Problem Relation Age of Onset   Healthy Mother    Healthy Father    Heart disease Brother    Cancer Brother    Cancer Daughter    Alcohol abuse Son    Diabetes Son    Heart disease Son     Social History   Tobacco Use   Smoking status: Never    Smokeless tobacco: Former    Types: Snuff    Quit date: 09/27/1986   Tobacco comments:    smoking cessation materials not required  Substance Use Topics   Alcohol use: No    Alcohol/week: 0.0 standard drinks of alcohol     Current Outpatient Medications:    acetaminophen (TYLENOL) 500 MG tablet, Take 1 tablet (500 mg total) by mouth every 6 (six) hours as needed., Disp: 100 tablet, Rfl: 0   amLODipine (NORVASC) 10 MG tablet, Take 1 tablet (10 mg total) by mouth daily., Disp: 90 tablet, Rfl: 1   Blood Glucose-BP Monitor (BLOOD GLUCOSE-WRIST BP MONITOR) DEVI, 1 each by Does not apply route daily., Disp: 1 each, Rfl: 0   hydrOXYzine (ATARAX) 10 MG tablet, Take 1 tablet (10 mg total) by mouth at  bedtime., Disp: 90 tablet, Rfl: 0   levothyroxine (SYNTHROID) 88 MCG tablet, TAKE 1 TABLET BY MOUTH ONCE DAILY MONDAYS  THROUGH  FRIDAYS  ONLY, Disp: 84 tablet, Rfl: 0   lidocaine (LIDODERM) 5 %, Place 2 patches onto the skin daily. (remove after 12 hours), Disp: 180 patch, Rfl: 1   lisinopril (ZESTRIL) 20 MG tablet, Take 1 tablet (20 mg total) by mouth daily., Disp: 90 tablet, Rfl: 1   polyethylene glycol powder (GLYCOLAX/MIRALAX) 17 GM/SCOOP powder, Take 17 g by mouth daily with breakfast., Disp: 3350 g, Rfl: 1   QUEtiapine (SEROQUEL) 25 MG tablet, Take 1 tablet (25 mg total) by mouth at bedtime., Disp: 90 tablet, Rfl: 1  Allergies  Allergen Reactions   Lyrica [Pregabalin]     " I felt groggy"    I personally reviewed active problem list, medication list, allergies, family history, social history, health maintenance with the patient/caregiver today.   ROS  Ten systems reviewed and is negative except as mentioned in HPI   Objective  Vitals:   02/13/23 1453  BP: (!) 146/72  Pulse: 88  Resp: 16  SpO2: 98%  Weight: 119 lb (54 kg)  Height: 5\' 3"  (1.6 m)    Body mass index is 21.08 kg/m.  Physical Exam  Constitutional: Patient appears well-developed and well-nourished.  No  distress.  HEENT: head atraumatic, normocephalic, pupils equal and reactive to light, neck supple Cardiovascular: Normal rate, regular rhythm and normal heart sounds.  No murmur heard. No BLE edema. Pulmonary/Chest: Effort normal and breath sounds normal. No respiratory distress. Abdominal: Soft.  There is no tenderness. Psychiatric: Cooperative and smiling   PHQ2/9:    02/13/2023    2:52 PM 11/11/2022    3:14 PM 08/10/2022    2:53 PM 05/09/2022    3:07 PM 01/04/2022    3:06 PM  Depression screen PHQ 2/9  Decreased Interest 0 0 2 1 1   Down, Depressed, Hopeless 0 0 2 1 3   PHQ - 2 Score 0 0 4 2 4   Altered sleeping  3 2 1  0  Tired, decreased energy  0 1 1 0  Change in appetite  0 0 0 0  Feeling bad or failure about yourself   0 1 0 1  Trouble concentrating  0 3 3 0  Moving slowly or fidgety/restless  0 0 0 0  Suicidal thoughts  0 1 0 0  PHQ-9 Score  3 12 7 5   Difficult doing work/chores   Somewhat difficult Not difficult at all     phq 9 is unable to check due to dementia   Fall Risk:    02/13/2023    3:00 PM 11/11/2022    3:14 PM 08/10/2022    2:51 PM 05/09/2022    3:06 PM 01/04/2022    3:06 PM  Fall Risk   Falls in the past year? 1 0 1 0 1  Number falls in past yr: 1 0 0  1  Injury with Fall? 0 0 0  0  Risk for fall due to : Impaired balance/gait;Impaired mobility Impaired balance/gait History of fall(s);Impaired balance/gait Impaired balance/gait Impaired balance/gait;Impaired mobility  Follow up Falls prevention discussed Falls prevention discussed Falls prevention discussed;Education provided;Falls evaluation completed Falls prevention discussed;Education provided;Falls evaluation completed Falls prevention discussed      Functional Status Survey: Is the patient deaf or have difficulty hearing?: Yes Does the patient have difficulty seeing, even when wearing glasses/contacts?: Yes Does the patient have difficulty concentrating, remembering, or making  decisions?:  Yes Does the patient have difficulty walking or climbing stairs?: Yes Does the patient have difficulty dressing or bathing?: Yes Does the patient have difficulty doing errands alone such as visiting a doctor's office or shopping?: Yes    Assessment & Plan  1. Stage 3a chronic kidney disease (HCC)  - lisinopril (ZESTRIL) 20 MG tablet; Take 1 tablet (20 mg total) by mouth daily.  Dispense: 90 tablet; Refill: 1  2. Pancytopenia (HCC)  Dose not want to see hematologist   3. Atherosclerosis of abdominal aorta (HCC)  Not on statins due to co morbidities and age  38. Benign hypertension with chronic kidney disease, stage III (HCC)  - lisinopril (ZESTRIL) 20 MG tablet; Take 1 tablet (20 mg total) by mouth daily.  Dispense: 90 tablet; Refill: 1  5. Superior mesenteric artery stenosis (HCC)  Stable at this time  6. Secondary renal hyperparathyroidism (HCC)  Does not want to see nephrologist due to age  85. Mild protein-calorie malnutrition (HCC)  Lost more weight since last visit, hides food, advised caregiver to sit down to eat with her   8. Mild vascular dementia with anxiety (HCC)  Getting progressively worse  9. Gastroesophageal reflux disease without esophagitis  No problems lately   10. Neuropathic pain of flank, right  Doing better on lidoderm patches  11. Pancreatic mass  Not a candidate for intervention   12. Palliative care patient   13. Chronic pain syndrome  Stable on current regiment   14. Acquired hypothyroidism  - levothyroxine (SYNTHROID) 88 MCG tablet; Take 1 tablet (88 mcg total) by mouth daily before breakfast.  Dispense: 84 tablet; Refill: 1 TSH today   15. Essential hypertension  - lisinopril (ZESTRIL) 20 MG tablet; Take 1 tablet (20 mg total) by mouth daily.  Dispense: 90 tablet; Refill: 1 - TSH

## 2023-02-13 ENCOUNTER — Encounter: Payer: Self-pay | Admitting: Family Medicine

## 2023-02-13 ENCOUNTER — Telehealth: Payer: Self-pay | Admitting: Family Medicine

## 2023-02-13 ENCOUNTER — Ambulatory Visit: Payer: Medicare PPO | Admitting: Family Medicine

## 2023-02-13 VITALS — BP 146/72 | HR 88 | Resp 16 | Ht 63.0 in | Wt 119.0 lb

## 2023-02-13 DIAGNOSIS — I700 Atherosclerosis of aorta: Secondary | ICD-10-CM

## 2023-02-13 DIAGNOSIS — N1831 Chronic kidney disease, stage 3a: Secondary | ICD-10-CM | POA: Diagnosis not present

## 2023-02-13 DIAGNOSIS — F01A4 Vascular dementia, mild, with anxiety: Secondary | ICD-10-CM | POA: Diagnosis not present

## 2023-02-13 DIAGNOSIS — K219 Gastro-esophageal reflux disease without esophagitis: Secondary | ICD-10-CM

## 2023-02-13 DIAGNOSIS — E441 Mild protein-calorie malnutrition: Secondary | ICD-10-CM | POA: Diagnosis not present

## 2023-02-13 DIAGNOSIS — M792 Neuralgia and neuritis, unspecified: Secondary | ICD-10-CM

## 2023-02-13 DIAGNOSIS — D61818 Other pancytopenia: Secondary | ICD-10-CM | POA: Diagnosis not present

## 2023-02-13 DIAGNOSIS — K551 Chronic vascular disorders of intestine: Secondary | ICD-10-CM

## 2023-02-13 DIAGNOSIS — Z515 Encounter for palliative care: Secondary | ICD-10-CM

## 2023-02-13 DIAGNOSIS — G894 Chronic pain syndrome: Secondary | ICD-10-CM

## 2023-02-13 DIAGNOSIS — I10 Essential (primary) hypertension: Secondary | ICD-10-CM

## 2023-02-13 DIAGNOSIS — I129 Hypertensive chronic kidney disease with stage 1 through stage 4 chronic kidney disease, or unspecified chronic kidney disease: Secondary | ICD-10-CM

## 2023-02-13 DIAGNOSIS — E039 Hypothyroidism, unspecified: Secondary | ICD-10-CM

## 2023-02-13 DIAGNOSIS — K8689 Other specified diseases of pancreas: Secondary | ICD-10-CM

## 2023-02-13 DIAGNOSIS — N2581 Secondary hyperparathyroidism of renal origin: Secondary | ICD-10-CM | POA: Diagnosis not present

## 2023-02-13 DIAGNOSIS — N183 Chronic kidney disease, stage 3 unspecified: Secondary | ICD-10-CM

## 2023-02-13 MED ORDER — LISINOPRIL 20 MG PO TABS: 20.0000 mg | ORAL_TABLET | Freq: Every day | ORAL | 1 refills | Status: DC

## 2023-02-13 MED ORDER — LEVOTHYROXINE SODIUM 88 MCG PO TABS: 88.0000 ug | ORAL_TABLET | Freq: Every day | ORAL | 1 refills | Status: DC

## 2023-02-13 NOTE — Telephone Encounter (Signed)
Pt daughter stated a medication other than levothyroxine (SYNTHROID) 88 MCG tablet, lisinopril (ZESTRIL) 20 MG tablet that was supposed to be sent in but was not sent in. It is not at the pharmacy.  Preferred Pharmacy  [facility] [address], [place] - [address]  [address] [place] [address]  Phone: [phone] Fax: [phone]  Hours: Not open 24 hours    Please advise.

## 2023-02-14 LAB — TSH: TSH: 2.95 mIU/L (ref 0.40–4.50)

## 2023-03-20 ENCOUNTER — Other Ambulatory Visit: Payer: Self-pay | Admitting: Family Medicine

## 2023-03-20 ENCOUNTER — Telehealth: Payer: Self-pay | Admitting: Family Medicine

## 2023-03-20 DIAGNOSIS — F05 Delirium due to known physiological condition: Secondary | ICD-10-CM

## 2023-03-20 MED ORDER — QUETIAPINE FUMARATE 25 MG PO TABS: 25.0000 mg | ORAL_TABLET | Freq: Every day | ORAL | 2 refills | Status: DC

## 2023-03-20 NOTE — Telephone Encounter (Signed)
Copied from CRM [id number]. Topic: General - Other >> Mar 20, 2023  1:31 PM Carrielelia G wrote: Pt. Daughter [name] is calling because doctor discussed a sleep aid. Is it possible that she can have it. She stays up all night, walking.  Daughter ask if you can call her as soon as possible.

## 2023-03-28 ENCOUNTER — Ambulatory Visit: Payer: Self-pay | Admitting: *Deleted

## 2023-03-28 NOTE — Telephone Encounter (Signed)
  Chief Complaint: Seroquel not helping her sleep. Symptoms: not sleeping, up all night, agitated and getting worse. Frequency: nightly Pertinent Negatives: Patient denies N/A Disposition: [] ED /[] Urgent Care (no appt availability in office) / [] Appointment(In office/virtual)/ []  [facility]/ [] Home Care/ [] Refused Recommended Disposition /[] [facility]/ [x]  Follow-up with PCP Additional Notes: Daughter requesting something else to help pt sleep.   The Seroquel is not helping at all.   She seems to be getting worse and agitated.    Message sent to Dr. Carlynn Purl.    Daughter, Woodroe Chen (on [place]) agreeable to someone calling her back.

## 2023-03-28 NOTE — Telephone Encounter (Signed)
Message from Allen Kell sent at 03/28/2023  2:52 PM EDT  Summary: medication making pt worse   Pt daughter states that pt cannot sleep, up all night, pt is getting worse and she is wanting to know if she need to be on different medication.  QUEtiapine (SEROQUEL) 25 MG tablet  Please advise.          Call History   Type Contact Phone/Fax User  03/28/2023 02:51 PM EDT Phone (Incoming) Woodroe Chen (Emergency Contact) [phone] Allred, Dondra Prader    Reason for Disposition  Insomnia is an ongoing problem (> 2 weeks)  Answer Assessment - Initial Assessment Questions 1. DESCRIPTION: "Tell me about your sleeping problem."      I returned call to daughter (on [place]) [name] Graves. Dr. Carlynn Purl prescribed the Seroquel 25 mg to help her sleep.   She has been having trouble sleeping for a long time.   Dr. Carlynn Purl is trying different medications.   This one is not working.   It made her agitated, and she was up all night.   She seems to be getting worse. Is there a different medication Dr. Carlynn Purl would recommend we try with her?     2. ONSET: "How long have you been having trouble sleeping?" (e.g., days, weeks, months)     Her mother has been having this problem for a long time.   Dr. Carlynn Purl is trying different medications. 3. RECURRENT: "Have you had sleeping problems before?"  If Yes, ask: "What happened that time?" "What helped your sleeping problem go away in the past?"      Not asked 4. STRESS: "Is there anything in your life that is making you feel stressed or tense?"     Not asked 5. PAIN: "Do you have any pain that is keeping you awake?" (e.g., back pain, headache, abdomen pain)     Not asked 6. CAFFEINE ABUSE: "Do you drink caffeinated beverages, and how much each day?" (e.g., coffee, tea, colas)     Not asked 7. ALCOHOL USE OR SUBSTANCE USE (DRUG USE): "Do you drink alcohol or use any illegal drugs?"     Not asked 8. OTHER SYMPTOMS: "Do you have any other symptoms?"  (e.g.,  difficulty breathing)     Seems to be getting worse with not sleeping and being up all night.   Now the pt is getting agitated.  Protocols used: Insomnia-A-AH

## 2023-03-29 NOTE — Telephone Encounter (Signed)
Daughter aware.

## 2023-04-10 NOTE — Progress Notes (Deleted)
Name: Lori Pacheco   MRN: [id number]    DOB: [birth year]   Date:04/10/2023       Progress Note  Subjective  Chief Complaint  Follow Up  HPI  Patient came in today with her daughter [name] -lives next door and usually is the one that brings her to our office -  patient's youngest daughter - [name] - lives with the patient ). Ms. Franckowiak and daughters are aware of her advanced age and multiple medical problems that are not managed such as CKI and also MGUS and increase in cognitive dysfunction. She is now having palliative care by [facility] visiting her once a month She is DNR . She is now hiding the food that she does not want to eat    She used to have a lot of pruritis but doing well lately. She has hydroxyzine at home but not using it lately    CKI stage III with secondary hyperparathyroidism: not willing to see nephrologist at this time, last GFR was still low and high parathyroid level, they finally stopped giving her vitamin D ( it was high ) and we will recheck it today   CT abdomen done [facility] in 2015   1.  No evidence of bowel ischemia. 2.  Prominent calcified atheromatous plaque at the origin of the celiac artery and SMA resulting in mild-moderate stenosis, as above, but the distal branches remain patent. 3.  Small amount of free fluid in the pelvis of unknown etiology.    Repeat CT abdomen at [facility] 01/2020   1. Right lower lobe pulmonary embolism. This was discussed with the ordering ER physician. 2. 3.3 x 1.1 cm hypoattenuating lesion within the pancreatic head, favored to represent a sidebranch IPMN. In the setting of advanced age, no definite follow-up is necessary, however, this may be further evaluated with MRI/MRCP as clinically desired. 3. Small-volume abdominal pelvic ascites with diffuse mesenteric edema. Findings are nonspecific without identifiable cause by CT. 4. No evidence of bowel obstruction as clinically questioned.   DVT left leg: she had DVT on left leg, also history  of PE. Marland Kitchen No calf tenderness or swelling.  She is off Eliquis due to her age, recurrent falls and risk of bleeding , no problems with pain or swelling at this time Unchanged     Atherosclerosis of aorta/ she also has mesenteric artery stenosis: she is no longer taking eliquis and refuses statin therapy, she has intermittent abdominal pain after meals , eating small amounts, weight is down. She is under palliative   Pancreatic mass: found on CT abdomen, daughter and patient are aware of the findings of pancreatic mass on CT done at [facility] showed a mass, she denies any recent abdominal pain. Family agrees she would not tolerate surgery due to advance age They continue to agree with current regiment and palliative care    CT result :  PANCREAS: Lobulated hypoattenuating lesion within the pancreatic head measuring 3.3 x 1.1 cm (4:26). Top differential consideration would include a side branch IPMN.   Constipation: she is doing better now that she has been taking miralax daily , mixed with her coffee  , but now she is no longer drinking coffee because she thinks her daughter is putting alcohol in her drink. Explained it can be mixed with juice without her seeing it    Hypothyroidism: she is on levothyroxine 88 mcg 5 days a week. She is doing well, last level was at goal   Pancytopenia She used to go  to the cancer center but no longer due to not wanting treatment or monitoring. Unchanged   Malnutrition: her weight is going down again. She is wrapping the food she is not eating and hiding around the house.   Vascular Dementia with anxiety and sundowning: daughter states she is having more agitation at the end of the day, : she seems to be hallucinating intermittently , she talks to people that have already died, now hiding dirty underwear and food , sometimes she says that people have been in the house, but not afraid, she does not harm self or others  She is still pacing at night time  , I gave her Quetiapine  on her last visit but daughter said she did not pick it up yet   Right flank pain/neuropathy: responds to lidoderm patch but she only uses it prn Doing well at this time. Occasionally still complains of pain but no longer going to [facility] Stable   Patient Active Problem List   Diagnosis Date Noted   Mild vascular dementia with anxiety (HCC) 11/11/2022   Pancreatic mass 08/10/2022   History of recent fall 05/10/2022   Gastroesophageal reflux disease without esophagitis 05/10/2022   Chronic pain syndrome 05/09/2022   Palliative care patient 05/09/2022   Neuropathic pain of flank, right 05/09/2022   Benign hypertension with chronic kidney disease, stage III (HCC) 05/09/2022   Mild major depression (HCC) 05/09/2022   Stage 3a chronic kidney disease (HCC) 05/09/2022   Pancytopenia (HCC) 12/15/2020   Superior mesenteric artery stenosis (HCC) 03/02/2020   Stage 3b chronic kidney disease (HCC) 03/02/2020   History of pulmonary embolism 02/07/2020   Chronic abdominal pain 02/07/2020   Depression with anxiety 02/07/2020   Cognitive dysfunction 12/25/2018   Varicose veins of leg with pain, bilateral 12/04/2018   History of DVT (deep vein thrombosis) 10/17/2018   Other constipation 04/23/2018   Leg cramps 04/23/2018   Thrombocytopenia (HCC) 05/15/2017   Secondary renal hyperparathyroidism (HCC) 05/15/2017   Atherosclerosis of abdominal aorta (HCC) 11/10/2016   Perennial allergic rhinitis with seasonal variation 11/10/2016   Vitamin D deficiency 03/29/2016   Hypothyroidism 09/18/2015   Essential hypertension 05/19/2015   Hyperlipemia 05/19/2015    Past Surgical History:  Procedure Laterality Date   NO PAST SURGERIES      Family History  Problem Relation Age of Onset   Healthy Mother    Healthy Father    Heart disease Brother    Cancer Brother    Cancer Daughter    Alcohol abuse Son    Diabetes Son    Heart disease Son     Social History   Tobacco Use   Smoking status: Never    Smokeless tobacco: Former    Types: Snuff    Quit date: 09/27/1986   Tobacco comments:    smoking cessation materials not required  Substance Use Topics   Alcohol use: No    Alcohol/week: 0.0 standard drinks of alcohol     Current Outpatient Medications:    acetaminophen (TYLENOL) 500 MG tablet, Take 1 tablet (500 mg total) by mouth every 6 (six) hours as needed., Disp: 100 tablet, Rfl: 0   amLODipine (NORVASC) 10 MG tablet, Take 1 tablet (10 mg total) by mouth daily., Disp: 90 tablet, Rfl: 1   Blood Glucose-BP Monitor (BLOOD GLUCOSE-WRIST BP MONITOR) DEVI, 1 each by Does not apply route daily., Disp: 1 each, Rfl: 0   hydrOXYzine (ATARAX) 10 MG tablet, Take 1 tablet (10 mg total) by mouth at  bedtime., Disp: 90 tablet, Rfl: 0   levothyroxine (SYNTHROID) 88 MCG tablet, Take 1 tablet (88 mcg total) by mouth daily before breakfast., Disp: 84 tablet, Rfl: 1   lidocaine (LIDODERM) 5 %, Place 2 patches onto the skin daily. (remove after 12 hours), Disp: 180 patch, Rfl: 1   lisinopril (ZESTRIL) 20 MG tablet, Take 1 tablet (20 mg total) by mouth daily., Disp: 90 tablet, Rfl: 1   polyethylene glycol powder (GLYCOLAX/MIRALAX) 17 GM/SCOOP powder, Take 17 g by mouth daily with breakfast., Disp: 3350 g, Rfl: 1   QUEtiapine (SEROQUEL) 25 MG tablet, Take 1 tablet (25 mg total) by mouth at bedtime., Disp: 30 tablet, Rfl: 2  Allergies  Allergen Reactions   Lyrica [Pregabalin]     " I felt groggy"    I personally reviewed active problem list, medication list, allergies, family history, social history, health maintenance with the patient/caregiver today.   ROS  ***  Objective  There were no vitals filed for this visit.  There is no height or weight on file to calculate BMI.  Physical Exam ***  Recent Results (from the past 2160 hour(s))  TSH     Status: None   Collection Time: 02/13/23  3:25 PM  Result Value Ref Range   TSH 2.95 0.40 - 4.50 mIU/L    PHQ2/9:    02/13/2023    2:52 PM  11/11/2022    3:14 PM 08/10/2022    2:53 PM 05/09/2022    3:07 PM 01/04/2022    3:06 PM  Depression screen PHQ 2/9  Decreased Interest 0 0 2 1 1   Down, Depressed, Hopeless 0 0 2 1 3   PHQ - 2 Score 0 0 4 2 4   Altered sleeping  3 2 1  0  Tired, decreased energy  0 1 1 0  Change in appetite  0 0 0 0  Feeling bad or failure about yourself   0 1 0 1  Trouble concentrating  0 3 3 0  Moving slowly or fidgety/restless  0 0 0 0  Suicidal thoughts  0 1 0 0  PHQ-9 Score  3 12 7 5   Difficult doing work/chores   Somewhat difficult Not difficult at all     phq 9 is {gen pos [id number]}   Fall Risk:    02/13/2023    3:00 PM 11/11/2022    3:14 PM 08/10/2022    2:51 PM 05/09/2022    3:06 PM 01/04/2022    3:06 PM  Fall Risk   Falls in the past year? 1 0 1 0 1  Number falls in past yr: 1 0 0  1  Injury with Fall? 0 0 0  0  Risk for fall due to : Impaired balance/gait;Impaired mobility Impaired balance/gait History of fall(s);Impaired balance/gait Impaired balance/gait Impaired balance/gait;Impaired mobility  Follow up Falls prevention discussed Falls prevention discussed Falls prevention discussed;Education provided;Falls evaluation completed Falls prevention discussed;Education provided;Falls evaluation completed Falls prevention discussed      Functional Status Survey:      Assessment & Plan  *** There are no diagnoses linked to this encounter.

## 2023-04-11 ENCOUNTER — Ambulatory Visit: Payer: Medicare PPO | Admitting: Family Medicine

## 2023-04-13 NOTE — Progress Notes (Signed)
Name: Lori Pacheco   MRN: [id number]    DOB: 1921/03/31   Date:04/14/2023       Progress Note  Subjective  Chief Complaint  Follow Up  HPI  Patient came in today with her daughter [name] -lives next door and usually is the one that brings her to our office -  patient's youngest daughter - [name] - lives with the patient ). Ms. Lanius and daughters are aware of her advanced age and multiple medical problems that are not managed such as CKI and also MGUS and increase in cognitive dysfunction. She was discharged from palliative care since she has been so stable.  She is DNR . =She still hides her food but eats small portions sometimes    She used to have a lot of pruritis but doing well lately. She has hydroxyzine at home but has not used it lately    CKI stage III with secondary hyperparathyroidism: not willing to see nephrologist at this time, last GFR was still low and high parathyroid level, last vitamin D was back to normal range   CT abdomen done [facility] in 2015   1.  No evidence of bowel ischemia. 2.  Prominent calcified atheromatous plaque at the origin of the celiac artery and SMA resulting in mild-moderate stenosis, as above, but the distal branches remain patent. 3.  Small amount of free fluid in the pelvis of unknown etiology.    Repeat CT abdomen at [facility] 01/2020   1. Right lower lobe pulmonary embolism. This was discussed with the ordering ER physician. 2. 3.3 x 1.1 cm hypoattenuating lesion within the pancreatic head, favored to represent a sidebranch IPMN. In the setting of advanced age, no definite follow-up is necessary, however, this may be further evaluated with MRI/MRCP as clinically desired. 3. Small-volume abdominal pelvic ascites with diffuse mesenteric edema. Findings are nonspecific without identifiable cause by CT. 4. No evidence of bowel obstruction as clinically questioned.   DVT left leg: she had DVT on left leg, also history of PE. No calf tenderness or swelling.   She is off Eliquis due to her age, recurrent falls and risk of bleeding , no problems with pain or swelling at this time We will continue to monitor    Atherosclerosis of aorta/ she also has mesenteric artery stenosis: she is no longer taking eliquis and refuses statin therapy, she has intermittent abdominal pain after meals , eating small amounts, weight is stable since last visit at 119 lbs   Pancreatic mass: found on CT abdomen, daughter and patient are aware of the findings of pancreatic mass on CT done at [facility] showed a mass, she denies any recent abdominal pain. Family agrees she would not tolerate surgery due to advance age She is now 89 . Stable    CT result :  PANCREAS: Lobulated hypoattenuating lesion within the pancreatic head measuring 3.3 x 1.1 cm (4:26). Top differential consideration would include a side branch IPMN.   Constipation: she is doing better now that she has been taking miralax daily, they used to mix in her coffee, but  she stopped  drinking coffee because she thinks her daughter is putting alcohol in her drink. They are now mixing it with hot cocoa and sometimes takes warm apple juice.    Hypothyroidism: she is on levothyroxine 88 mcg 5 days a week. She is doing well, last level was at goal , continue current dose   Pancytopenia She used to go to the cancer center but no  longer due to not wanting treatment or monitoring. Unchanged   Malnutrition: her weight is stable since last visit .She continues to hide her food but family is trying to make sure she eats some of her food in front of them   Vascular Dementia with anxiety and sundowning: daughter states she is having more agitation at the end of the day, : she seems to be hallucinating intermittently , she talks to people that have already died, now hiding dirty underwear and food , sometimes she says that people have been in the house, but not afraid, she does not harm self or others  She was pacing all night, we sent  seroquel but family decided to give her melatonin and is working   Right flank pain/neuropathy: responds to lidoderm patch but she only uses it prn Doing well at this time. Occasionally still complains of pain but no longer going to [facility]. Unchanged  Recent fall: she forgets to use her cane and fell last week, hit her head, had a bruised right eye but resolving now, did not complain of much pain or headaches, behavior did not change   Patient Active Problem List   Diagnosis Date Noted   Mild vascular dementia with anxiety (HCC) 11/11/2022   Pancreatic mass 08/10/2022   History of recent fall 05/10/2022   Gastroesophageal reflux disease without esophagitis 05/10/2022   Chronic pain syndrome 05/09/2022   Palliative care patient 05/09/2022   Neuropathic pain of flank, right 05/09/2022   Benign hypertension with chronic kidney disease, stage III (HCC) 05/09/2022   Mild major depression (HCC) 05/09/2022   Stage 3a chronic kidney disease (HCC) 05/09/2022   Pancytopenia (HCC) 12/15/2020   Superior mesenteric artery stenosis (HCC) 03/02/2020   Stage 3b chronic kidney disease (HCC) 03/02/2020   History of pulmonary embolism 02/07/2020   Chronic abdominal pain 02/07/2020   Depression with anxiety 02/07/2020   Cognitive dysfunction 12/25/2018   Varicose veins of leg with pain, bilateral 12/04/2018   History of DVT (deep vein thrombosis) 10/17/2018   Other constipation 04/23/2018   Leg cramps 04/23/2018   Thrombocytopenia (HCC) 05/15/2017   Secondary renal hyperparathyroidism (HCC) 05/15/2017   Atherosclerosis of abdominal aorta (HCC) 11/10/2016   Perennial allergic rhinitis with seasonal variation 11/10/2016   Vitamin D deficiency 03/29/2016   Hypothyroidism 09/18/2015   Essential hypertension 05/19/2015   Hyperlipemia 05/19/2015    Past Surgical History:  Procedure Laterality Date   NO PAST SURGERIES      Family History  Problem Relation Age of Onset   Healthy Mother    Healthy  Father    Heart disease Brother    Cancer Brother    Cancer Daughter    Alcohol abuse Son    Diabetes Son    Heart disease Son     Social History   Tobacco Use   Smoking status: Never   Smokeless tobacco: Former    Types: Snuff    Quit date: 09/27/1986   Tobacco comments:    smoking cessation materials not required  Substance Use Topics   Alcohol use: No    Alcohol/week: 0.0 standard drinks of alcohol     Current Outpatient Medications:    acetaminophen (TYLENOL) 500 MG tablet, Take 1 tablet (500 mg total) by mouth every 6 (six) hours as needed., Disp: 100 tablet, Rfl: 0   amLODipine (NORVASC) 10 MG tablet, Take 1 tablet (10 mg total) by mouth daily., Disp: 90 tablet, Rfl: 1   Blood Glucose-BP Monitor (BLOOD GLUCOSE-WRIST BP  MONITOR) DEVI, 1 each by Does not apply route daily., Disp: 1 each, Rfl: 0   hydrOXYzine (ATARAX) 10 MG tablet, Take 1 tablet (10 mg total) by mouth at bedtime., Disp: 90 tablet, Rfl: 0   levothyroxine (SYNTHROID) 88 MCG tablet, Take 1 tablet (88 mcg total) by mouth daily before breakfast., Disp: 84 tablet, Rfl: 1   lidocaine (LIDODERM) 5 %, Place 2 patches onto the skin daily. (remove after 12 hours), Disp: 180 patch, Rfl: 1   lisinopril (ZESTRIL) 20 MG tablet, Take 1 tablet (20 mg total) by mouth daily., Disp: 90 tablet, Rfl: 1   polyethylene glycol powder (GLYCOLAX/MIRALAX) 17 GM/SCOOP powder, Take 17 g by mouth daily with breakfast., Disp: 3350 g, Rfl: 1   QUEtiapine (SEROQUEL) 25 MG tablet, Take 1 tablet (25 mg total) by mouth at bedtime., Disp: 30 tablet, Rfl: 2  Allergies  Allergen Reactions   Lyrica [Pregabalin]     " I felt groggy"    I personally reviewed active problem list, medication list, allergies, family history, social history, health maintenance with the patient/caregiver today.   ROS  Constitutional: Negative for fever or weight change.  Respiratory: Negative for cough and shortness of breath.   Cardiovascular: Negative for chest  pain or palpitations.  Gastrointestinal: Negative for abdominal pain, no bowel changes.  Musculoskeletal: Negative for gait problem or joint swelling.  Skin: Negative for rash.  Neurological: Negative for dizziness or headache.  No other specific complaints in a complete review of systems (except as listed in HPI above).   Objective  Vitals:   04/14/23 1424 04/14/23 1444  BP: (!) 158/68 (!) 150/66  Pulse: 73   Resp: 16   Weight: 119 lb (54 kg)   Height: 5\' 3"  (1.6 m)     Body mass index is 21.08 kg/m.  Physical Exam  Constitutional: Patient appears well-developed and malnutrition, temporal waisting  No distress.  HEENT: head atraumatic, normocephalic, pupils equal and reactive to light, neck supple, Cardiovascular: Normal rate, regular rhythm and normal heart sounds.  No murmur heard. No BLE edema. Pulmonary/Chest: Effort normal and breath sounds normal. No respiratory distress. Abdominal: Soft.  There is no tenderness. Psychiatric: Patient has a normal mood and affect. She could not remember names of grandchildren, and talked about some kids that she seems to hallucinate about   Recent Results (from the past 2160 hour(s))  TSH     Status: None   Collection Time: 02/13/23  3:25 PM  Result Value Ref Range   TSH 2.95 0.40 - 4.50 mIU/L    PHQ2/9:    04/14/2023    2:39 PM 02/13/2023    2:52 PM 11/11/2022    3:14 PM 08/10/2022    2:53 PM 05/09/2022    3:07 PM  Depression screen PHQ 2/9  Decreased Interest 0 0 0 2 1  Down, Depressed, Hopeless 0 0 0 2 1  PHQ - 2 Score 0 0 0 4 2  Altered sleeping 0  3 2 1   Tired, decreased energy 0  0 1 1  Change in appetite 0  0 0 0  Feeling bad or failure about yourself  0  0 1 0  Trouble concentrating 0  0 3 3  Moving slowly or fidgety/restless 0  0 0 0  Suicidal thoughts 0  0 1 0  PHQ-9 Score 0  3 12 7   Difficult doing work/chores    Somewhat difficult Not difficult at all    phq 9 is negative  Fall Risk:    04/14/2023    2:23  PM 02/13/2023    3:00 PM 11/11/2022    3:14 PM 08/10/2022    2:51 PM 05/09/2022    3:06 PM  Fall Risk   Falls in the past year? 1 1 0 1 0  Number falls in past yr: 1 1 0 0   Injury with Fall? 1 0 0 0   Risk for fall due to : Impaired mobility;Impaired balance/gait Impaired balance/gait;Impaired mobility Impaired balance/gait History of fall(s);Impaired balance/gait Impaired balance/gait  Follow up Falls prevention discussed Falls prevention discussed Falls prevention discussed Falls prevention discussed;Education provided;Falls evaluation completed Falls prevention discussed;Education provided;Falls evaluation completed      Functional Status Survey: Is the patient deaf or have difficulty hearing?: Yes Does the patient have difficulty seeing, even when wearing glasses/contacts?: Yes Does the patient have difficulty concentrating, remembering, or making decisions?: Yes Does the patient have difficulty walking or climbing stairs?: Yes Does the patient have difficulty dressing or bathing?: Yes Does the patient have difficulty doing errands alone such as visiting a doctor's office or shopping?: Yes    Assessment & Plan   1. Atherosclerosis of abdominal aorta (HCC)  Stable   2. Mild vascular dementia with anxiety (HCC)  Stable, seems less anxious today   3. Superior mesenteric artery stenosis [facility])  Not complaining of abdominal pain and no recent visits to [facility]  4. Benign hypertension with chronic kidney disease, stage III (HCC)  Does not want to be seen by nephrologist   5. Secondary renal hyperparathyroidism (HCC)  Stable  6. Mild protein-calorie malnutrition (HCC)  Discussed protein shakes   7. Pancytopenia (HCC)  Not interested in follow ups   8. Stage 3a chronic kidney disease (HCC)  Likely stage iv now we will recheck labs next visit   9. Neuropathic pain of flank, right  Stable  10. Pancreatic mass

## 2023-04-14 ENCOUNTER — Ambulatory Visit: Payer: Medicare PPO | Admitting: Family Medicine

## 2023-04-14 ENCOUNTER — Encounter: Payer: Self-pay | Admitting: Family Medicine

## 2023-04-14 VITALS — BP 150/66 | HR 73 | Resp 16 | Ht 63.0 in | Wt 119.0 lb

## 2023-04-14 DIAGNOSIS — K551 Chronic vascular disorders of intestine: Secondary | ICD-10-CM | POA: Diagnosis not present

## 2023-04-14 DIAGNOSIS — K8689 Other specified diseases of pancreas: Secondary | ICD-10-CM

## 2023-04-14 DIAGNOSIS — E441 Mild protein-calorie malnutrition: Secondary | ICD-10-CM

## 2023-04-14 DIAGNOSIS — N1831 Chronic kidney disease, stage 3a: Secondary | ICD-10-CM | POA: Diagnosis not present

## 2023-04-14 DIAGNOSIS — I129 Hypertensive chronic kidney disease with stage 1 through stage 4 chronic kidney disease, or unspecified chronic kidney disease: Secondary | ICD-10-CM

## 2023-04-14 DIAGNOSIS — I700 Atherosclerosis of aorta: Secondary | ICD-10-CM | POA: Diagnosis not present

## 2023-04-14 DIAGNOSIS — M792 Neuralgia and neuritis, unspecified: Secondary | ICD-10-CM

## 2023-04-14 DIAGNOSIS — D61818 Other pancytopenia: Secondary | ICD-10-CM

## 2023-04-14 DIAGNOSIS — N2581 Secondary hyperparathyroidism of renal origin: Secondary | ICD-10-CM

## 2023-04-14 DIAGNOSIS — N183 Chronic kidney disease, stage 3 unspecified: Secondary | ICD-10-CM

## 2023-04-14 DIAGNOSIS — F01A4 Vascular dementia, mild, with anxiety: Secondary | ICD-10-CM | POA: Diagnosis not present

## 2023-05-02 ENCOUNTER — Other Ambulatory Visit: Payer: Self-pay | Admitting: Family Medicine

## 2023-05-02 DIAGNOSIS — N1831 Chronic kidney disease, stage 3a: Secondary | ICD-10-CM

## 2023-05-02 DIAGNOSIS — I10 Essential (primary) hypertension: Secondary | ICD-10-CM

## 2023-05-02 DIAGNOSIS — N183 Chronic kidney disease, stage 3 unspecified: Secondary | ICD-10-CM

## 2023-05-25 ENCOUNTER — Ambulatory Visit (INDEPENDENT_AMBULATORY_CARE_PROVIDER_SITE_OTHER): Payer: Medicare PPO

## 2023-05-25 VITALS — Ht 62.0 in | Wt 119.0 lb

## 2023-05-25 DIAGNOSIS — Z0000 Encounter for general adult medical examination without abnormal findings: Secondary | ICD-10-CM | POA: Diagnosis not present

## 2023-05-25 NOTE — Patient Instructions (Signed)
Lori Pacheco , Thank you for taking time to come for your Medicare Wellness Visit. I appreciate your ongoing commitment to your health goals. Please review the following plan we discussed and let me know if I can assist you in the future.   Referrals/Orders/Follow-Ups/Clinician Recommendations: none  This is a list of the screening recommended for you and due dates:  Health Maintenance  Topic Date Due   Zoster (Shingles) Vaccine (1 of 2) 04/02/1973   COVID-19 Vaccine (4 - 2023-24 season) 06/17/2022   DTaP/Tdap/Td vaccine (2 - Td or Tdap) 07/06/2022   Flu Shot  05/18/2023   Pneumonia Vaccine  Completed   DEXA scan (bone density measurement)  Completed   HPV Vaccine  Aged Out    Advanced directives: (In Chart) A copy of your advanced directives are scanned into your chart should your provider ever need it.  Next Medicare Annual Wellness Visit scheduled for next year: Yes 05/30/2024 @3pm  telephone  Preventive Care 65 Years and Older, Female Preventive care refers to lifestyle choices and visits with your health care provider that can promote health and wellness. What does preventive care include? A yearly physical exam. This is also called an annual well check. Dental exams once or twice a year. Routine eye exams. Ask your health care provider how often you should have your eyes checked. Personal lifestyle choices, including: Daily care of your teeth and gums. Regular physical activity. Eating a healthy diet. Avoiding tobacco and drug use. Limiting alcohol use. Practicing safe sex. Taking low-dose aspirin every day. Taking vitamin and mineral supplements as recommended by your health care provider. What happens during an annual well check? The services and screenings done by your health care provider during your annual well check will depend on your age, overall health, lifestyle risk factors, and family history of disease. Counseling  Your health care provider may ask you questions  about your: Alcohol use. Tobacco use. Drug use. Emotional well-being. Home and relationship well-being. Sexual activity. Eating habits. History of falls. Memory and ability to understand (cognition). Work and work Astronomer. Reproductive health. Screening  You may have the following tests or measurements: Height, weight, and BMI. Blood pressure. Lipid and cholesterol levels. These may be checked every 5 years, or more frequently if you are over 65 years old. Skin check. Lung cancer screening. You may have this screening every year starting at age 18 if you have a 30-pack-year history of smoking and currently smoke or have quit within the past 15 years. Fecal occult blood test (FOBT) of the stool. You may have this test every year starting at age 68. Flexible sigmoidoscopy or colonoscopy. You may have a sigmoidoscopy every 5 years or a colonoscopy every 10 years starting at age 64. Hepatitis C blood test. Hepatitis B blood test. Sexually transmitted disease (STD) testing. Diabetes screening. This is done by checking your blood sugar (glucose) after you have not eaten for a while (fasting). You may have this done every 1-3 years. Bone density scan. This is done to screen for osteoporosis. You may have this done starting at age 19. Mammogram. This may be done every 1-2 years. Talk to your health care provider about how often you should have regular mammograms. Talk with your health care provider about your test results, treatment options, and if necessary, the need for more tests. Vaccines  Your health care provider may recommend certain vaccines, such as: Influenza vaccine. This is recommended every year. Tetanus, diphtheria, and acellular pertussis (Tdap, Td) vaccine. You  may need a Td booster every 10 years. Zoster vaccine. You may need this after age 38. Pneumococcal 13-valent conjugate (PCV13) vaccine. One dose is recommended after age 18. Pneumococcal polysaccharide (PPSV23)  vaccine. One dose is recommended after age 29. Talk to your health care provider about which screenings and vaccines you need and how often you need them. This information is not intended to replace advice given to you by your health care provider. Make sure you discuss any questions you have with your health care provider. Document Released: 10/30/2015 Document Revised: 06/22/2016 Document Reviewed: 08/04/2015 Elsevier Interactive Patient Education  2017 [employer].  Fall Prevention in the Home Falls can cause injuries. They can happen to people of all ages. There are many things you can do to make your home safe and to help prevent falls. What can I do on the outside of my home? Regularly fix the edges of walkways and driveways and fix any cracks. Remove anything that might make you trip as you walk through a door, such as a raised step or threshold. Trim any bushes or trees on the path to your home. Use bright outdoor lighting. Clear any walking paths of anything that might make someone trip, such as rocks or tools. Regularly check to see if handrails are loose or broken. Make sure that both sides of any steps have handrails. Any raised decks and porches should have guardrails on the edges. Have any leaves, snow, or ice cleared regularly. Use sand or salt on walking paths during winter. Clean up any spills in your garage right away. This includes oil or grease spills. What can I do in the bathroom? Use night lights. Install grab bars by the toilet and in the tub and shower. Do not use towel bars as grab bars. Use non-skid mats or decals in the tub or shower. If you need to sit down in the shower, use a plastic, non-slip stool. Keep the floor dry. Clean up any water that spills on the floor as soon as it happens. Remove soap buildup in the tub or shower regularly. Attach bath mats securely with double-sided non-slip rug tape. Do not have throw rugs and other things on the floor that  can make you trip. What can I do in the bedroom? Use night lights. Make sure that you have a light by your bed that is easy to reach. Do not use any sheets or blankets that are too big for your bed. They should not hang down onto the floor. Have a firm chair that has side arms. You can use this for support while you get dressed. Do not have throw rugs and other things on the floor that can make you trip. What can I do in the kitchen? Clean up any spills right away. Avoid walking on wet floors. Keep items that you use a lot in easy-to-reach places. If you need to reach something above you, use a strong step stool that has a grab bar. Keep electrical cords out of the way. Do not use floor polish or wax that makes floors slippery. If you must use wax, use non-skid floor wax. Do not have throw rugs and other things on the floor that can make you trip. What can I do with my stairs? Do not leave any items on the stairs. Make sure that there are handrails on both sides of the stairs and use them. Fix handrails that are broken or loose. Make sure that handrails are as long as the  stairways. Check any carpeting to make sure that it is firmly attached to the stairs. Fix any carpet that is loose or worn. Avoid having throw rugs at the top or bottom of the stairs. If you do have throw rugs, attach them to the floor with carpet tape. Make sure that you have a light switch at the top of the stairs and the bottom of the stairs. If you do not have them, ask someone to add them for you. What else can I do to help prevent falls? Wear shoes that: Do not have high heels. Have rubber bottoms. Are comfortable and fit you well. Are closed at the toe. Do not wear sandals. If you use a stepladder: Make sure that it is fully opened. Do not climb a closed stepladder. Make sure that both sides of the stepladder are locked into place. Ask someone to hold it for you, if possible. Clearly mark and make sure that you  can see: Any grab bars or handrails. First and last steps. Where the edge of each step is. Use tools that help you move around (mobility aids) if they are needed. These include: Canes. Walkers. Scooters. Crutches. Turn on the lights when you go into a dark area. Replace any light bulbs as soon as they burn out. Set up your furniture so you have a clear path. Avoid moving your furniture around. If any of your floors are uneven, fix them. If there are any pets around you, be aware of where they are. Review your medicines with your doctor. Some medicines can make you feel dizzy. This can increase your chance of falling. Ask your doctor what other things that you can do to help prevent falls. This information is not intended to replace advice given to you by your health care provider. Make sure you discuss any questions you have with your health care provider. Document Released: 07/30/2009 Document Revised: 03/10/2016 Document Reviewed: 11/07/2014 Elsevier Interactive Patient Education  2017 [employer].

## 2023-05-25 NOTE — Progress Notes (Signed)
Subjective:   Lori Pacheco is a 89 y.o. female who presents for Medicare Annual (Subsequent) preventive examination.  Visit Complete: Virtual  I connected with  Lori Pacheco and her daughter Lori Pacheco on 05/25/23 by a audio enabled telemedicine application and verified that I am speaking with the correct person using two identifiers.  Patient Location: Home  Provider Location: Home Office  I discussed the limitations of evaluation and management by telemedicine. The patient expressed understanding and agreed to proceed.  Vital Signs: Unable to obtain new vitals due to this being a telehealth visit.  Patient Medicare AWV questionnaire was completed by the patient on (not done); I have confirmed that all information answered by patient is correct and no changes since this date.  Review of Systems    Cardiac Risk Factors include: advanced age (>107men, >30 women);hypertension    Objective:    Today's Vitals   05/25/23 1438  Weight: 119 lb (54 kg)  Height: 5\' 2"  (1.575 m)   Body mass index is 21.77 kg/m.     05/25/2023    2:48 PM 07/14/2022    3:17 PM 03/23/2022    5:36 PM 09/28/2021    2:30 PM 07/21/2020    3:26 PM 04/17/2020    2:51 PM 05/18/2019    8:47 AM  Advanced Directives  Does Patient Have a Medical Advance Directive? Yes No No Yes No No No  Type of Estate agent of [place];Living will   Healthcare Power of [place];Living will     Copy of Healthcare Power of Attorney in Chart?    Yes - validated most recent copy scanned in chart (See row information)     Would patient like information on creating a medical advance directive?   No - Patient declined  Yes (MAU/Ambulatory/Procedural Areas - Information given)  No - Patient declined    Current Medications (verified) Outpatient Encounter Medications as of 05/25/2023  Medication Sig   acetaminophen (TYLENOL) 500 MG tablet Take 1 tablet (500 mg total) by mouth every 6 (six) hours as needed.   amLODipine  (NORVASC) 10 MG tablet TAKE 1 TABLET EVERY DAY   Blood Glucose-BP Monitor (BLOOD GLUCOSE-WRIST BP MONITOR) DEVI 1 each by Does not apply route daily.   hydrOXYzine (ATARAX) 10 MG tablet Take 1 tablet (10 mg total) by mouth at bedtime.   levothyroxine (SYNTHROID) 88 MCG tablet Take 1 tablet (88 mcg total) by mouth daily before breakfast.   lidocaine (LIDODERM) 5 % Place 2 patches onto the skin daily. (remove after 12 hours)   lisinopril (ZESTRIL) 20 MG tablet TAKE 1 TABLET EVERY DAY   polyethylene glycol powder (GLYCOLAX/MIRALAX) 17 GM/SCOOP powder Take 17 g by mouth daily with breakfast.   No facility-administered encounter medications on file as of 05/25/2023.    Allergies (verified) Lyrica [pregabalin]   History: Past Medical History:  Diagnosis Date   Hyperlipidemia    Hypertension    Thyroid disease    Past Surgical History:  Procedure Laterality Date   NO PAST SURGERIES     Family History  Problem Relation Age of Onset   Healthy Mother    Healthy Father    Heart disease Brother    Cancer Brother    Cancer Daughter    Alcohol abuse Son    Diabetes Son    Heart disease Son    Social History   Socioeconomic History   Marital status: Widowed    Spouse name: Fayrene Fearing   Number of children:  9   Years of education: Not on file   Highest education level: 3rd grade  Occupational History   Occupation: Retired  Tobacco Use   Smoking status: Never   Smokeless tobacco: Former    Types: Snuff    Quit date: 09/27/1986   Tobacco comments:    smoking cessation materials not required  Vaping Use   Vaping status: Never Used  Substance and Sexual Activity   Alcohol use: No    Alcohol/week: 0.0 standard drinks of alcohol   Drug use: No   Sexual activity: Not Currently  Other Topics Concern   Not on file  Social History Narrative    Pt's daughter lives with her and another daughter lives next door   Social Determinants of Health   Financial Resource Strain: Low Risk   (05/25/2023)   Overall Financial Resource Strain (CARDIA)    Difficulty of Paying Living Expenses: Not hard at all  Food Insecurity: No Food Insecurity (05/25/2023)   Hunger Vital Sign    Worried About Running Out of Food in the Last Year: Never true    Ran Out of Food in the Last Year: Never true  Transportation Needs: No Transportation Needs (05/25/2023)   PRAPARE - Administrator, Civil Service (Medical): No    Lack of Transportation (Non-Medical): No  Physical Activity: Sufficiently Active (05/25/2023)   Exercise Vital Sign    Days of Exercise per Week: 7 days    Minutes of Exercise per Session: 30 min  Stress: Stress Concern Present (05/25/2023)   [employer] of Occupational Health - Occupational Stress Questionnaire    Feeling of Stress : To some extent  Social Connections: Moderately Isolated (05/25/2023)   Social Connection and Isolation Panel [NHANES]    Frequency of Communication with Friends and Family: More than three times a week    Frequency of Social Gatherings with Friends and Family: More than three times a week    Attends Religious Services: 1 to 4 times per year    Active Member of [employer] or Organizations: No    Attends Banker Meetings: Never    Marital Status: Widowed    Tobacco Counseling Counseling given: Not Answered Tobacco comments: smoking cessation materials not required   Clinical Intake:  Pre-visit preparation completed: Yes  Pain : No/denies pain    BMI - recorded: 21.77 Nutritional Status: BMI 25 -29 Overweight Nutritional Risks: None Diabetes: No  How often do you need to have someone help you when you read instructions, pamphlets, or other written materials from your doctor or pharmacy?: 1 - Never  Interpreter Needed?: No  Comments: lives with daughter Information entered by :: B.,LPN   Activities of Daily Living    05/25/2023    2:49 PM 04/14/2023    2:23 PM  In your present state of health, do you have  any difficulty performing the following activities:  Hearing? 1 1  Vision? 1 1  Difficulty concentrating or making decisions? 1 1  Walking or climbing stairs? 1 1  Dressing or bathing? 1 1  Doing errands, shopping? 1 1  Preparing Food and eating ? N   Using the Toilet? N   In the past six months, have you accidently leaked urine? Y   Do you have problems with loss of bowel control? N   Managing your Medications? Y   Managing your Finances? Y   Housekeeping or managing your Housekeeping? Y     Patient Care Team: Alba Cory,  MD as PCP - General (Family Medicine)  Indicate any recent Medical Services you may have received from other than [facility] providers in the past year (date may be approximate).     Assessment:   This is a routine wellness examination for [place].  Hearing/Vision screen Hearing Screening - Comments:: Hearing adequate Vision Screening - Comments:: Adequate vision  [facility]  Dietary issues and exercise activities discussed:     Goals Addressed             This Visit's Progress    Patient requires assistance with medicaiton management and adherence   On track     Clinical Goals:   Interventions:       Patient Stated   On track    Maintain healthy blood pressure        Depression Screen    05/25/2023    2:46 PM 04/14/2023    2:39 PM 02/13/2023    2:52 PM 11/11/2022    3:14 PM 08/10/2022    2:53 PM 05/09/2022    3:07 PM 01/04/2022    3:06 PM  PHQ 2/9 Scores  PHQ - 2 Score 0 0 0 0 4 2 4   PHQ- 9 Score  0  3 12 7 5     Fall Risk    05/25/2023    2:42 PM 04/14/2023    2:23 PM 02/13/2023    3:00 PM 11/11/2022    3:14 PM 08/10/2022    2:51 PM  Fall Risk   Falls in the past year? 1 1 1  0 1  Number falls in past yr: 1 1 1  0 0  Injury with Fall? 0 1 0 0 0  Risk for fall due to : Impaired balance/gait Impaired mobility;Impaired balance/gait Impaired balance/gait;Impaired mobility Impaired balance/gait History of fall(s);Impaired balance/gait   Follow up Education provided;Falls prevention discussed Falls prevention discussed Falls prevention discussed Falls prevention discussed Falls prevention discussed;Education provided;Falls evaluation completed    MEDICARE RISK AT HOME:  Medicare Risk at Home - 05/25/23 1442     Any stairs in or around the home? Yes    If so, are there any without handrails? Yes    Home free of loose throw rugs in walkways, pet beds, electrical cords, etc? Yes    Adequate lighting in your home to reduce risk of falls? Yes    Life alert? No    Use of a cane, walker or w/c? Yes    Grab bars in the bathroom? No    Shower chair or bench in shower? No    Elevated toilet seat or a handicapped toilet? No             TIMED UP AND GO:  Was the test performed?  No    Cognitive Function:        12/25/2018    3:11 PM 12/22/2017    3:27 PM 12/19/2016    9:04 AM  6CIT Screen  What Year? 4 points 4 points 4 points  What month? 0 points 0 points 0 points  What time? 0 points 0 points 3 points  Count back from 20 4 points 4 points 4 points  Months in reverse 4 points 4 points 4 points  Repeat phrase 10 points 10 points 8 points  Total Score 22 points 22 points 23 points    Immunizations Immunization History  Administered Date(s) Administered   Fluad Quad(high Dose 65+) 07/01/2019, 08/12/2020, 07/02/2021, 08/10/2022   Influenza, High Dose Seasonal PF 09/17/2015, 08/22/2016, 06/26/2017,  07/23/2018   Influenza-Unspecified 09/02/2014   PFIZER(Purple Top)SARS-COV-2 Vaccination 12/10/2019, 01/07/2020   Pfizer Covid-19 Vaccine Bivalent Booster 67yrs & up 09/28/2020   Pneumococcal Conjugate-13 11/14/2013, 09/27/2016   Pneumococcal Polysaccharide-23 11/29/2011   Tdap 07/06/2012   Zoster, Live 05/29/2008    TDAP status: Up to date  Flu Vaccine status: Up to date  Pneumococcal vaccine status: Up to date  Covid-19 vaccine status: Completed vaccines  Qualifies for Shingles Vaccine? Yes   Zostavax  completed No   Shingrix Completed?: No.    Education has been provided regarding the importance of this vaccine. Patient has been advised to call insurance company to determine out of pocket expense if they have not yet received this vaccine. Advised may also receive vaccine at local pharmacy or Health Dept. Verbalized acceptance and understanding.  Screening Tests Health Maintenance  Topic Date Due   Zoster Vaccines- Shingrix (1 of 2) 04/02/1973   COVID-19 Vaccine (4 - 2023-24 season) 06/17/2022   DTaP/Tdap/Td (2 - Td or Tdap) 07/06/2022   INFLUENZA VACCINE  05/18/2023   Pneumonia Vaccine 34+ Years old  Completed   DEXA SCAN  Completed   HPV VACCINES  Aged Out    Health Maintenance  Health Maintenance Due  Topic Date Due   Zoster Vaccines- Shingrix (1 of 2) 04/02/1973   COVID-19 Vaccine (4 - 2023-24 season) 06/17/2022   DTaP/Tdap/Td (2 - Td or Tdap) 07/06/2022   INFLUENZA VACCINE  05/18/2023    Colorectal cancer screening: No longer required.   Mammogram status: No longer required due to age.   Lung Cancer Screening: (Low Dose CT Chest recommended if Age 49-80 years, 20 pack-year currently smoking OR have quit w/in 15years.) does not qualify.   Lung Cancer Screening Referral: yes  Additional Screening:  Hepatitis C Screening: does not qualify; Completed yes  Vision Screening: Recommended annual ophthalmology exams for early detection of glaucoma and other disorders of the eye. Is the patient up to date with their annual eye exam?  Yes  Who is the provider or what is the name of the office in which the patient attends annual eye exams? [facility] If pt is not established with a provider, would they like to be referred to a provider to establish care? No .   Dental Screening: Recommended annual dental exams for proper oral hygiene  Diabetic Foot Exam: n/a  Community Resource Referral / Chronic Care Management: CRR required this visit?  No   CCM required this visit?   No     Plan:     I have personally reviewed and noted the following in the patient's chart:   Medical and social history Use of alcohol, tobacco or illicit drugs  Current medications and supplements including opioid prescriptions. Patient is not currently taking opioid prescriptions. Functional ability and status Nutritional status Physical activity Advanced directives List of other physicians Hospitalizations, surgeries, and ER visits in previous 12 months Vitals Screenings to include cognitive, depression, and falls Referrals and appointments  In addition, I have reviewed and discussed with patient certain preventive protocols, quality metrics, and best practice recommendations. A written personalized care plan for preventive services as well as general preventive health recommendations were provided to patient.     Sue Lush, LPN   10/22/1094   After Visit Summary: (Declined) Due to this being a telephonic visit, with patients personalized plan was offered to patient but patient Declined AVS at this time   Nurse Notes: I spoke with pt and her daughter Lori Pacheco.  She relays she still does everything for herself other than some of the house management. Her daughter does the home maintenance and finances. They have no questions or concerns at this time.

## 2023-06-14 ENCOUNTER — Other Ambulatory Visit: Payer: Self-pay | Admitting: Family Medicine

## 2023-06-14 DIAGNOSIS — E039 Hypothyroidism, unspecified: Secondary | ICD-10-CM

## 2023-06-23 NOTE — Telephone Encounter (Signed)
Copied from CRM [id number]. Topic: General - Other >> Jun 23, 2023  1:24 PM [place] B wrote: Reason for CRM: pt;s daughter called in for refill of levothyroxine (SYNTHROID) 88 MCG tablet but was denied, saying she needs labs, first she siad labs were done the last she was there and then said she thought she was told she dindt need anynore labs. Please call back

## 2023-06-26 NOTE — Telephone Encounter (Signed)
Copied from CRM [id number]. Topic: General - Other >> Jun 23, 2023  3:31 PM Phill Myron wrote: Reason for CRM: Please call Ms Luiz Blare, she would like to discuss getting her mother some relaxing meds. Please advise

## 2023-06-27 ENCOUNTER — Other Ambulatory Visit: Payer: Self-pay

## 2023-06-27 DIAGNOSIS — E039 Hypothyroidism, unspecified: Secondary | ICD-10-CM | POA: Diagnosis not present

## 2023-06-28 LAB — TSH: TSH: 5.63 m[IU]/L — ABNORMAL HIGH (ref 0.40–4.50)

## 2023-06-30 ENCOUNTER — Other Ambulatory Visit: Payer: Self-pay | Admitting: Family Medicine

## 2023-06-30 DIAGNOSIS — E039 Hypothyroidism, unspecified: Secondary | ICD-10-CM

## 2023-06-30 MED ORDER — LEVOTHYROXINE SODIUM 88 MCG PO TABS: 88.0000 ug | ORAL_TABLET | Freq: Every day | ORAL | 0 refills | Status: DC

## 2023-07-19 ENCOUNTER — Telehealth: Payer: Self-pay

## 2023-07-19 NOTE — Telephone Encounter (Signed)
Patient's daughter states she is constantly walking around agitated, trying to climb and not sleeping.  Wants to see about her getting on something? Also she will need refill on Thyroid med, do you need to adjust?

## 2023-07-24 NOTE — Telephone Encounter (Signed)
Notified. 

## 2023-07-24 NOTE — Telephone Encounter (Signed)
They have seroquel but made her worse, need something to help her sleep at night.  Also please review TSH and adjust or send in refill

## 2023-09-21 ENCOUNTER — Encounter: Payer: Self-pay | Admitting: Physician Assistant

## 2023-09-21 ENCOUNTER — Ambulatory Visit: Payer: Medicare PPO | Admitting: Physician Assistant

## 2023-09-21 VITALS — BP 120/68 | HR 74 | Resp 16 | Ht 63.0 in | Wt 116.0 lb

## 2023-09-21 DIAGNOSIS — F09 Unspecified mental disorder due to known physiological condition: Secondary | ICD-10-CM | POA: Diagnosis not present

## 2023-09-21 DIAGNOSIS — I10 Essential (primary) hypertension: Secondary | ICD-10-CM | POA: Diagnosis not present

## 2023-09-21 DIAGNOSIS — E039 Hypothyroidism, unspecified: Secondary | ICD-10-CM | POA: Diagnosis not present

## 2023-09-21 DIAGNOSIS — Z23 Encounter for immunization: Secondary | ICD-10-CM

## 2023-09-21 DIAGNOSIS — E782 Mixed hyperlipidemia: Secondary | ICD-10-CM

## 2023-09-21 DIAGNOSIS — F01B4 Vascular dementia, moderate, with anxiety: Secondary | ICD-10-CM | POA: Diagnosis not present

## 2023-09-21 MED ORDER — TRAZODONE HCL 50 MG PO TABS: 25.0000 mg | ORAL_TABLET | Freq: Every evening | ORAL | 1 refills | Status: DC | PRN

## 2023-09-21 NOTE — Progress Notes (Signed)
Established Patient Office Visit  Name: Lori Pacheco   MRN: [id number]    DOB: 04-14-1921   Date:09/21/2023  Today's Provider: Jacquelin Hawking, MHS, PA-C Introduced myself to the patient as a PA-C and provided education on APPs in clinical practice.         Subjective  Chief Complaint  Chief Complaint  Patient presents with   Follow-up    6 months. Discuss Alzheimers    HPI  Patient is here with several members of her family who are providing most of the HPI today   Dementia Her daughter reports she walks around a lot and doesn't sleep well They are concerned for constant fidgeting and restlessness  They report she is frequently confused and it is difficult to redirect at times  They are using Melatonin 5-10 mg  to assist with sleep- mixed results so far   HYPERTENSION / HYPERLIPIDEMIA Satisfied with current treatment? yes Duration of hypertension: years BP monitoring frequency: not checking BP range:  BP medication side effects: no  BP meds: amlodipine and lisinopril Duration of hyperlipidemia: years Cholesterol medication side effects: no Cholesterol supplements: none  cholesterol medications: none Medication compliance: good compliance Aspirin: no Recent stressors: no Recurrent headaches: no Visual changes: no Palpitations: no Dyspnea: no Chest pain: no Lower extremity edema: yes Dizzy/lightheaded: no   HYPOTHYROIDISM Thyroid control status:controlled Satisfied with current treatment? yes Medication side effects: no Medication compliance: good compliance Etiology of hypothyroidism:  Recent dose adjustment:no Fatigue: no Cold intolerance: yes Heat intolerance: no Weight gain: no Weight loss: no Constipation: yes- relieved with Miralax  Diarrhea/loose stools: no Palpitations: no  Lower extremity edema: yes, intermittently Anxiety/depressed mood: no   Patient Active Problem List   Diagnosis Date Noted   Mild vascular dementia with  anxiety (HCC) 11/11/2022   Pancreatic mass 08/10/2022   History of recent fall 05/10/2022   Gastroesophageal reflux disease without esophagitis 05/10/2022   Chronic pain syndrome 05/09/2022   Palliative care patient 05/09/2022   Neuropathic pain of flank, right 05/09/2022   Benign hypertension with chronic kidney disease, stage III (HCC) 05/09/2022   Mild major depression (HCC) 05/09/2022   Stage 3a chronic kidney disease (HCC) 05/09/2022   Pancytopenia (HCC) 12/15/2020   Superior mesenteric artery stenosis (HCC) 03/02/2020   Stage 3b chronic kidney disease (HCC) 03/02/2020   History of pulmonary embolism 02/07/2020   Chronic abdominal pain 02/07/2020   Depression with anxiety 02/07/2020   Cognitive dysfunction 12/25/2018   Varicose veins of leg with pain, bilateral 12/04/2018   History of DVT (deep vein thrombosis) 10/17/2018   Other constipation 04/23/2018   Leg cramps 04/23/2018   Thrombocytopenia (HCC) 05/15/2017   Secondary renal hyperparathyroidism (HCC) 05/15/2017   Atherosclerosis of abdominal aorta (HCC) 11/10/2016   Perennial allergic rhinitis with seasonal variation 11/10/2016   Vitamin D deficiency 03/29/2016   Hypothyroidism 09/18/2015   Essential hypertension 05/19/2015   Hyperlipemia 05/19/2015    Past Surgical History:  Procedure Laterality Date   NO PAST SURGERIES      Family History  Problem Relation Age of Onset   Healthy Mother    Healthy Father    Heart disease Brother    Cancer Brother    Cancer Daughter    Alcohol abuse Son    Diabetes Son    Heart disease Son     Social History   Tobacco Use   Smoking status: Never   Smokeless tobacco: Former    Types:  Snuff    Quit date: 09/27/1986   Tobacco comments:    smoking cessation materials not required  Substance Use Topics   Alcohol use: No    Alcohol/week: 0.0 standard drinks of alcohol     Current Outpatient Medications:    acetaminophen (TYLENOL) 500 MG tablet, Take 1 tablet (500 mg  total) by mouth every 6 (six) hours as needed., Disp: 100 tablet, Rfl: 0   amLODipine (NORVASC) 10 MG tablet, TAKE 1 TABLET EVERY DAY, Disp: 90 tablet, Rfl: 1   Blood Glucose-BP Monitor (BLOOD GLUCOSE-WRIST BP MONITOR) DEVI, 1 each by Does not apply route daily., Disp: 1 each, Rfl: 0   hydrOXYzine (ATARAX) 10 MG tablet, Take 1 tablet (10 mg total) by mouth at bedtime., Disp: 90 tablet, Rfl: 0   levothyroxine (SYNTHROID) 88 MCG tablet, Take 1 tablet (88 mcg total) by mouth daily before breakfast. And extra half on Sundays, Disp: 100 tablet, Rfl: 0   lidocaine (LIDODERM) 5 %, Place 2 patches onto the skin daily. (remove after 12 hours), Disp: 180 patch, Rfl: 1   lisinopril (ZESTRIL) 20 MG tablet, TAKE 1 TABLET EVERY DAY, Disp: 90 tablet, Rfl: 1   polyethylene glycol powder (GLYCOLAX/MIRALAX) 17 GM/SCOOP powder, Take 17 g by mouth daily with breakfast., Disp: 3350 g, Rfl: 1   traZODone (DESYREL) 50 MG tablet, Take 0.5-1 tablets (25-50 mg total) by mouth at bedtime as needed for sleep., Disp: 30 tablet, Rfl: 1  Allergies  Allergen Reactions   Lyrica [Pregabalin]     " I felt groggy"    I personally reviewed active problem list, medication list, allergies, notes from last encounter, lab results with the patient/caregiver today.   Review of Systems  Psychiatric/Behavioral:  Positive for memory loss. Negative for depression. The patient has insomnia.       Objective  Vitals:   09/21/23 1511  BP: 120/68  Pulse: 74  Resp: 16  SpO2: 94%  Weight: 116 lb (52.6 kg)  Height: 5\' 3"  (1.6 m)    Body mass index is 20.55 kg/m.  Physical Exam Vitals reviewed.  Constitutional:      General: She is awake.     Appearance: Normal appearance. She is well-developed and well-groomed.  HENT:     Head: Normocephalic and atraumatic.  Cardiovascular:     Rate and Rhythm: Normal rate and regular rhythm.     Pulses:          Radial pulses are 2+ on the right side and 2+ on the left side.     Heart  sounds: Normal heart sounds.  Pulmonary:     Effort: Pulmonary effort is normal.     Breath sounds: Normal breath sounds.  Musculoskeletal:     Right lower leg: 1+ Pitting Edema present.     Left lower leg: 1+ Pitting Edema present.  Neurological:     General: No focal deficit present.     Mental Status: She is alert. Mental status is at baseline.  Psychiatric:        Attention and Perception: She is inattentive.        Mood and Affect: Mood is elated.        Speech: Speech is delayed.        Behavior: Behavior is cooperative.        Cognition and Memory: Cognition is impaired. Memory is impaired.      Recent Results (from the past 2160 hour(s))  TSH     Status: Abnormal  Collection Time: 06/27/23  2:11 PM  Result Value Ref Range   TSH 5.63 (H) 0.40 - 4.50 mIU/L  TSH     Status: None   Collection Time: 09/21/23  4:01 PM  Result Value Ref Range   TSH 1.89 0.40 - 4.50 mIU/L  T4     Status: None   Collection Time: 09/21/23  4:01 PM  Result Value Ref Range   T4, Total 9.7 5.1 - 11.9 mcg/dL  U44     Status: None   Collection Time: 09/21/23  4:01 PM  Result Value Ref Range   Vitamin B-12 260 200 - 1,100 pg/mL    Comment: . Please Note: Although the reference range for vitamin B12 is [phone] pg/mL, it has been reported that between 5 and 10% of patients with values between 200 and 400 pg/mL may experience neuropsychiatric and hematologic abnormalities due to occult B12 deficiency; less than 1% of patients with values above 400 pg/mL will have symptoms. .   COMPLETE METABOLIC PANEL WITH GFR     Status: Abnormal   Collection Time: 09/21/23  4:01 PM  Result Value Ref Range   Glucose, Bld 105 (H) 65 - 99 mg/dL    Comment: .            Fasting reference interval . For someone without known diabetes, a glucose value between 100 and 125 mg/dL is consistent with prediabetes and should be confirmed with a follow-up test. .    BUN 30 (H) 7 - 25 mg/dL   Creat 0.34 (H) 7.42  - 0.95 mg/dL   eGFR 30 (L) > OR = 60 mL/min/1.26m2   BUN/Creatinine Ratio 19 6 - 22 (calc)   Sodium 143 135 - 146 mmol/L   Potassium 4.9 3.5 - 5.3 mmol/L   Chloride 107 98 - 110 mmol/L   CO2 31 20 - 32 mmol/L   Calcium 9.2 8.6 - 10.4 mg/dL   Total Protein 6.7 6.1 - 8.1 g/dL   Albumin 4.0 3.6 - 5.1 g/dL   Globulin 2.7 1.9 - 3.7 g/dL (calc)   AG Ratio 1.5 1.0 - 2.5 (calc)   Total Bilirubin 0.4 0.2 - 1.2 mg/dL   Alkaline phosphatase (APISO) 87 37 - 153 U/L   AST 14 10 - 35 U/L   ALT 8 6 - 29 U/L  CBC w/Diff/Platelet     Status: Abnormal   Collection Time: 09/21/23  4:01 PM  Result Value Ref Range   WBC 3.2 (L) 3.8 - 10.8 Thousand/uL   RBC 3.85 3.80 - 5.10 Million/uL   Hemoglobin 11.2 (L) 11.7 - 15.5 g/dL   HCT 59.5 63.8 - 75.6 %   MCV 91.7 80.0 - 100.0 fL   MCH 29.1 27.0 - 33.0 pg   MCHC 31.7 (L) 32.0 - 36.0 g/dL    Comment: For adults, a slight decrease in the calculated MCHC value (in the range of 30 to 32 g/dL) is most likely not clinically significant; however, it should be interpreted with caution in correlation with other red cell parameters and the patient's clinical condition.    RDW 12.9 11.0 - 15.0 %   Platelets 153 140 - 400 Thousand/uL   MPV 10.4 7.5 - 12.5 fL   Neutro Abs 1,853 1,500 - 7,800 cells/uL   Absolute Lymphocytes 1,040 850 - 3,900 cells/uL   Absolute Monocytes 259 200 - 950 cells/uL   Eosinophils Absolute 29 15 - 500 cells/uL   Basophils Absolute 19 0 - 200 cells/uL  Neutrophils Relative % 57.9 %   Total Lymphocyte 32.5 %   Monocytes Relative 8.1 %   Eosinophils Relative 0.9 %   Basophils Relative 0.6 %     PHQ2/9:    09/21/2023    3:11 PM 05/25/2023    2:46 PM 04/14/2023    2:39 PM 02/13/2023    2:52 PM 11/11/2022    3:14 PM  Depression screen PHQ 2/9  Decreased Interest 0 0 0 0 0  Down, Depressed, Hopeless 0 0 0 0 0  PHQ - 2 Score 0 0 0 0 0  Altered sleeping   0  3  Tired, decreased energy   0  0  Change in appetite   0  0  Feeling bad  or failure about yourself    0  0  Trouble concentrating   0  0  Moving slowly or fidgety/restless   0  0  Suicidal thoughts   0  0  PHQ-9 Score   0  3      Fall Risk:    09/21/2023    3:10 PM 05/25/2023    2:42 PM 04/14/2023    2:23 PM 02/13/2023    3:00 PM 11/11/2022    3:14 PM  Fall Risk   Falls in the past year? 0 1 1 1  0  Number falls in past yr: 0 1 1 1  0  Injury with Fall? 0 0 1 0 0  Risk for fall due to : Impaired balance/gait;Impaired mobility Impaired balance/gait Impaired mobility;Impaired balance/gait Impaired balance/gait;Impaired mobility Impaired balance/gait  Follow up Falls prevention discussed Education provided;Falls prevention discussed Falls prevention discussed Falls prevention discussed Falls prevention discussed      Functional Status Survey: Is the patient deaf or have difficulty hearing?: Yes Does the patient have difficulty seeing, even when wearing glasses/contacts?: Yes Does the patient have difficulty concentrating, remembering, or making decisions?: Yes Does the patient have difficulty walking or climbing stairs?: Yes Does the patient have difficulty dressing or bathing?: Yes Does the patient have difficulty doing errands alone such as visiting a doctor's office or shopping?: Yes    Assessment & Plan  Problem List Items Addressed This Visit       Cardiovascular and Mediastinum   Essential hypertension - Primary    Chronic, historic condition Blood pressure appears to be in goal today Patient is currently taking lisinopril 20 mg p.o. daily, amlodipine 10 mg p.o. daily and appears to be tolerating well Continue current regimen Follow-up in 3 months or sooner if concerns arise      Relevant Orders   COMPLETE METABOLIC PANEL WITH GFR (Completed)   CBC w/Diff/Platelet (Completed)     Endocrine   Hypothyroidism    Chronic, historic condition Appears well-managed on current regimen comprised of levothyroxine 88 mcg p.o. daily Recheck TSH,  T4 Results to dictate further management Follow-up in 6 months or sooner if concerns arise      Relevant Orders   TSH (Completed)   T4 (Completed)     Nervous and Auditory   Cognitive dysfunction    CC vascular dementia with anxiety A&P      Relevant Medications   traZODone (DESYREL) 50 MG tablet   Other Relevant Orders   B12 (Completed)   Mild vascular dementia with anxiety (HCC)    Chronic, ongoing Patient is here with her family who reports that she is predominantly pleasantly confused but they are concerned for her restlessness and constant fidgeting.  They also report lack  of sleep at night.  They have tried melatonin 5 to 10 mg per night to assist with this.  They report that Seroquel seem to make it worse when they tried this in the past We had a discussion regarding cognitive enhancers.  Discussed that patient is likely at a more advanced stage where these would likely not provide much added benefit to current cognitive levels. We also reviewed Rexulti and other mood management medications.  Patient's family states that they do not think this is necessary at this time other than providing help with sleep Will try trazodone 25 to 50 mg p.o. nightly as needed to assist with sleep.  Reviewed dosing and administration with patient's family Follow-up in 3 months or sooner if concerns arise      Relevant Medications   traZODone (DESYREL) 50 MG tablet     Other   Hyperlipemia    Chronic, historic condition Patient is not currently taking medications for lipid management Recommend lipid panel to be checked at next follow-up appointment for ongoing monitoring. Follow-up in 6 months or sooner if concerns arise      Other Visit Diagnoses     Need for immunization against influenza       Relevant Orders   Flu Vaccine Trivalent High Dose (Fluad) (Completed)        No follow-ups on file.   I, Luceil Herrin E Tavius Turgeon, PA-C, have reviewed all documentation for this visit. The  documentation on 09/25/23 for the exam, diagnosis, procedures, and orders are all accurate and complete.   Jacquelin Hawking, MHS, PA-C [facility]

## 2023-09-22 LAB — CBC WITH DIFFERENTIAL/PLATELET
Absolute Lymphocytes: 1040 {cells}/uL (ref 850–3900)
Absolute Monocytes: 259 {cells}/uL (ref 200–950)
Basophils Absolute: 19 {cells}/uL (ref 0–200)
Basophils Relative: 0.6 %
Eosinophils Absolute: 29 {cells}/uL (ref 15–500)
Eosinophils Relative: 0.9 %
HCT: 35.3 % (ref 35.0–45.0)
Hemoglobin: 11.2 g/dL — ABNORMAL LOW (ref 11.7–15.5)
MCH: 29.1 pg (ref 27.0–33.0)
MCHC: 31.7 g/dL — ABNORMAL LOW (ref 32.0–36.0)
MCV: 91.7 fL (ref 80.0–100.0)
MPV: 10.4 fL (ref 7.5–12.5)
Monocytes Relative: 8.1 %
Neutro Abs: 1853 {cells}/uL (ref 1500–7800)
Neutrophils Relative %: 57.9 %
Platelets: 153 10*3/uL (ref 140–400)
RBC: 3.85 10*6/uL (ref 3.80–5.10)
RDW: 12.9 % (ref 11.0–15.0)
Total Lymphocyte: 32.5 %
WBC: 3.2 10*3/uL — ABNORMAL LOW (ref 3.8–10.8)

## 2023-09-22 LAB — COMPLETE METABOLIC PANEL WITH GFR
AG Ratio: 1.5 (calc) (ref 1.0–2.5)
ALT: 8 U/L (ref 6–29)
AST: 14 U/L (ref 10–35)
Albumin: 4 g/dL (ref 3.6–5.1)
Alkaline phosphatase (APISO): 87 U/L (ref 37–153)
BUN/Creatinine Ratio: 19 (calc) (ref 6–22)
BUN: 30 mg/dL — ABNORMAL HIGH (ref 7–25)
CO2: 31 mmol/L (ref 20–32)
Calcium: 9.2 mg/dL (ref 8.6–10.4)
Chloride: 107 mmol/L (ref 98–110)
Creat: 1.54 mg/dL — ABNORMAL HIGH (ref 0.60–0.95)
Globulin: 2.7 g/dL (ref 1.9–3.7)
Glucose, Bld: 105 mg/dL — ABNORMAL HIGH (ref 65–99)
Potassium: 4.9 mmol/L (ref 3.5–5.3)
Sodium: 143 mmol/L (ref 135–146)
Total Bilirubin: 0.4 mg/dL (ref 0.2–1.2)
Total Protein: 6.7 g/dL (ref 6.1–8.1)
eGFR: 30 mL/min/{1.73_m2} — ABNORMAL LOW (ref >=60)

## 2023-09-22 LAB — TSH: TSH: 1.89 m[IU]/L (ref 0.40–4.50)

## 2023-09-22 LAB — T4: T4, Total: 9.7 ug/dL (ref 5.1–11.9)

## 2023-09-22 LAB — VITAMIN B12: Vitamin B-12: 260 pg/mL (ref 200–1100)

## 2023-09-25 NOTE — Progress Notes (Signed)
Your labs are back Your electrolytes, liver function testing appears to be in normal limits.  Your kidney function does appear decreased but comparing to previous lab results it is stable and appears to be close to your normal.  Please make sure you are staying well-hydrated Your CBC appears stable compared to previous testing results.  There is a mild ongoing anemia but this appears to be stable and close to your normal. Your thyroid testing is normal Your B12 is in normal range but I would recommend supplementing as it is on the lower side of normal. Please let us know if you have further questions or concerns

## 2023-09-25 NOTE — Assessment & Plan Note (Signed)
Chronic, historic condition Patient is not currently taking medications for lipid management Recommend lipid panel to be checked at next follow-up appointment for ongoing monitoring. Follow-up in 6 months or sooner if concerns arise

## 2023-09-25 NOTE — Assessment & Plan Note (Signed)
CC vascular dementia with anxiety A&P

## 2023-09-25 NOTE — Assessment & Plan Note (Signed)
Chronic, ongoing Patient is here with her family who reports that she is predominantly pleasantly confused but they are concerned for her restlessness and constant fidgeting.  They also report lack of sleep at night.  They have tried melatonin 5 to 10 mg per night to assist with this.  They report that Seroquel seem to make it worse when they tried this in the past We had a discussion regarding cognitive enhancers.  Discussed that patient is likely at a more advanced stage where these would likely not provide much added benefit to current cognitive levels. We also reviewed Rexulti and other mood management medications.  Patient's family states that they do not think this is necessary at this time other than providing help with sleep Will try trazodone 25 to 50 mg p.o. nightly as needed to assist with sleep.  Reviewed dosing and administration with patient's family Follow-up in 3 months or sooner if concerns arise

## 2023-09-25 NOTE — Assessment & Plan Note (Signed)
Chronic, historic condition Blood pressure appears to be in goal today Patient is currently taking lisinopril 20 mg p.o. daily, amlodipine 10 mg p.o. daily and appears to be tolerating well Continue current regimen Follow-up in 3 months or sooner if concerns arise

## 2023-09-25 NOTE — Assessment & Plan Note (Signed)
Chronic, historic condition Appears well-managed on current regimen comprised of levothyroxine 88 mcg p.o. daily Recheck TSH, T4 Results to dictate further management Follow-up in 6 months or sooner if concerns arise

## 2023-10-09 DIAGNOSIS — Z8249 Family history of ischemic heart disease and other diseases of the circulatory system: Secondary | ICD-10-CM | POA: Diagnosis not present

## 2023-10-09 DIAGNOSIS — M1990 Unspecified osteoarthritis, unspecified site: Secondary | ICD-10-CM | POA: Diagnosis not present

## 2023-10-09 DIAGNOSIS — I700 Atherosclerosis of aorta: Secondary | ICD-10-CM | POA: Diagnosis not present

## 2023-10-09 DIAGNOSIS — E039 Hypothyroidism, unspecified: Secondary | ICD-10-CM | POA: Diagnosis not present

## 2023-10-09 DIAGNOSIS — I129 Hypertensive chronic kidney disease with stage 1 through stage 4 chronic kidney disease, or unspecified chronic kidney disease: Secondary | ICD-10-CM | POA: Diagnosis not present

## 2023-10-09 DIAGNOSIS — E785 Hyperlipidemia, unspecified: Secondary | ICD-10-CM | POA: Diagnosis not present

## 2023-10-09 DIAGNOSIS — I4891 Unspecified atrial fibrillation: Secondary | ICD-10-CM | POA: Diagnosis not present

## 2023-10-09 DIAGNOSIS — R32 Unspecified urinary incontinence: Secondary | ICD-10-CM | POA: Diagnosis not present

## 2023-10-09 DIAGNOSIS — K5900 Constipation, unspecified: Secondary | ICD-10-CM | POA: Diagnosis not present

## 2023-10-16 ENCOUNTER — Ambulatory Visit: Payer: Medicare PPO | Admitting: Family Medicine

## 2023-10-16 ENCOUNTER — Encounter: Payer: Self-pay | Admitting: Family Medicine

## 2023-10-16 VITALS — BP 140/82 | HR 78 | Resp 16 | Ht 63.0 in | Wt 118.9 lb

## 2023-10-16 DIAGNOSIS — I700 Atherosclerosis of aorta: Secondary | ICD-10-CM

## 2023-10-16 DIAGNOSIS — F01B4 Vascular dementia, moderate, with anxiety: Secondary | ICD-10-CM

## 2023-10-16 DIAGNOSIS — N1831 Chronic kidney disease, stage 3a: Secondary | ICD-10-CM

## 2023-10-16 DIAGNOSIS — I129 Hypertensive chronic kidney disease with stage 1 through stage 4 chronic kidney disease, or unspecified chronic kidney disease: Secondary | ICD-10-CM

## 2023-10-16 DIAGNOSIS — E039 Hypothyroidism, unspecified: Secondary | ICD-10-CM | POA: Diagnosis not present

## 2023-10-16 DIAGNOSIS — K551 Chronic vascular disorders of intestine: Secondary | ICD-10-CM

## 2023-10-16 DIAGNOSIS — M792 Neuralgia and neuritis, unspecified: Secondary | ICD-10-CM

## 2023-10-16 DIAGNOSIS — D61818 Other pancytopenia: Secondary | ICD-10-CM

## 2023-10-16 DIAGNOSIS — E441 Mild protein-calorie malnutrition: Secondary | ICD-10-CM

## 2023-10-16 DIAGNOSIS — E538 Deficiency of other specified B group vitamins: Secondary | ICD-10-CM

## 2023-10-16 DIAGNOSIS — N2581 Secondary hyperparathyroidism of renal origin: Secondary | ICD-10-CM

## 2023-10-16 DIAGNOSIS — N183 Chronic kidney disease, stage 3 unspecified: Secondary | ICD-10-CM

## 2023-10-16 MED ORDER — LEVOTHYROXINE SODIUM 88 MCG PO TABS: 88.0000 ug | ORAL_TABLET | Freq: Every day | ORAL | 1 refills | Status: DC

## 2023-10-16 MED ORDER — B-12 500 MCG SL SUBL: 1.0000 | SUBLINGUAL_TABLET | Freq: Every day | SUBLINGUAL | 1 refills | Status: DC

## 2023-10-16 MED ORDER — LISINOPRIL 20 MG PO TABS: 20.0000 mg | ORAL_TABLET | Freq: Every day | ORAL | 1 refills | Status: DC

## 2023-10-16 MED ORDER — TRAZODONE HCL 50 MG PO TABS: 25.0000 mg | ORAL_TABLET | Freq: Every evening | ORAL | 0 refills | Status: DC | PRN

## 2023-10-16 MED ORDER — CYANOCOBALAMIN 1000 MCG/ML IJ SOLN: 1000.0000 ug | Freq: Once | INTRAMUSCULAR | Status: AC

## 2023-10-16 MED ORDER — AMLODIPINE BESYLATE 10 MG PO TABS: 10.0000 mg | ORAL_TABLET | Freq: Every day | ORAL | 1 refills | Status: DC

## 2023-10-16 NOTE — Progress Notes (Deleted)
Name: Lori Pacheco   MRN: [id number]    DOB: 09-30-1921   Date:10/16/2023       Progress Note  Subjective  Chief Complaint  Chief Complaint  Patient presents with   Medical Management of Chronic Issues    HPI           *** Patient Active Problem List   Diagnosis Date Noted   Mild vascular dementia with anxiety (HCC) 11/11/2022   Pancreatic mass 08/10/2022   History of recent fall 05/10/2022   Gastroesophageal reflux disease without esophagitis 05/10/2022   Chronic pain syndrome 05/09/2022   Palliative care patient 05/09/2022   Neuropathic pain of flank, right 05/09/2022   Benign hypertension with chronic kidney disease, stage III (HCC) 05/09/2022   Mild major depression (HCC) 05/09/2022   Stage 3a chronic kidney disease (HCC) 05/09/2022   Pancytopenia (HCC) 12/15/2020   Superior mesenteric artery stenosis (HCC) 03/02/2020   Stage 3b chronic kidney disease (HCC) 03/02/2020   History of pulmonary embolism 02/07/2020   Chronic abdominal pain 02/07/2020   Depression with anxiety 02/07/2020   Cognitive dysfunction 12/25/2018   Varicose veins of leg with pain, bilateral 12/04/2018   History of DVT (deep vein thrombosis) 10/17/2018   Other constipation 04/23/2018   Leg cramps 04/23/2018   Thrombocytopenia (HCC) 05/15/2017   Secondary renal hyperparathyroidism (HCC) 05/15/2017   Atherosclerosis of abdominal aorta (HCC) 11/10/2016   Perennial allergic rhinitis with seasonal variation 11/10/2016   Vitamin D deficiency 03/29/2016   Hypothyroidism 09/18/2015   Essential hypertension 05/19/2015   Hyperlipemia 05/19/2015    Past Surgical History:  Procedure Laterality Date   NO PAST SURGERIES      Family History  Problem Relation Age of Onset   Healthy Mother    Healthy Father    Heart disease Brother    Cancer Brother    Cancer Daughter    Alcohol abuse Son    Diabetes Son    Heart disease Son     Social History   Tobacco Use   Smoking status: Never    Smokeless tobacco: Former    Types: Snuff    Quit date: 09/27/1986   Tobacco comments:    smoking cessation materials not required  Substance Use Topics   Alcohol use: No    Alcohol/week: 0.0 standard drinks of alcohol     Current Outpatient Medications:    acetaminophen (TYLENOL) 500 MG tablet, Take 1 tablet (500 mg total) by mouth every 6 (six) hours as needed., Disp: 100 tablet, Rfl: 0   amLODipine (NORVASC) 10 MG tablet, TAKE 1 TABLET EVERY DAY, Disp: 90 tablet, Rfl: 1   Blood Glucose-BP Monitor (BLOOD GLUCOSE-WRIST BP MONITOR) DEVI, 1 each by Does not apply route daily., Disp: 1 each, Rfl: 0   hydrOXYzine (ATARAX) 10 MG tablet, Take 1 tablet (10 mg total) by mouth at bedtime., Disp: 90 tablet, Rfl: 0   levothyroxine (SYNTHROID) 88 MCG tablet, Take 1 tablet (88 mcg total) by mouth daily before breakfast. And extra half on Sundays, Disp: 100 tablet, Rfl: 0   lidocaine (LIDODERM) 5 %, Place 2 patches onto the skin daily. (remove after 12 hours), Disp: 180 patch, Rfl: 1   lisinopril (ZESTRIL) 20 MG tablet, TAKE 1 TABLET EVERY DAY, Disp: 90 tablet, Rfl: 1   polyethylene glycol powder (GLYCOLAX/MIRALAX) 17 GM/SCOOP powder, Take 17 g by mouth daily with breakfast., Disp: 3350 g, Rfl: 1   traZODone (DESYREL) 50 MG tablet, Take 0.5-1 tablets (25-50 mg total) by mouth at  bedtime as needed for sleep., Disp: 30 tablet, Rfl: 1  Allergies  Allergen Reactions   Lyrica [Pregabalin]     " I felt groggy"    I personally reviewed active problem list, medication list, allergies, family history with the patient/caregiver today.   ROS  Ten systems reviewed and is negative except as mentioned in HPI    Objective  Vitals:   10/16/23 1453  BP: (!) 140/82  Pulse: 78  Resp: 16  SpO2: 96%  Weight: 118 lb 14.4 oz (53.9 kg)  Height: 5\' 3"  (1.6 m)    Body mass index is 21.06 kg/m.  Physical Exam  Constitutional: Patient appears well-developed and malnourished with temporal waisting   No  distress.  HEENT: head atraumatic, normocephalic, pupils equal and reactive to light, neck supple, throat within normal limits Cardiovascular: Normal rate, regular rhythm and normal heart sounds.  No murmur heard. No BLE edema. Pulmonary/Chest: Effort normal and breath sounds normal. No respiratory distress. Abdominal: Soft.  There is no tenderness. Psychiatric: Patient has a normal mood and affect. behavior is normal. Judgment and thought content normal.   Recent Results (from the past 2160 hours)  TSH     Status: None   Collection Time: 09/21/23  4:01 PM  Result Value Ref Range   TSH 1.89 0.40 - 4.50 mIU/L  T4     Status: None   Collection Time: 09/21/23  4:01 PM  Result Value Ref Range   T4, Total 9.7 5.1 - 11.9 mcg/dL  E45     Status: None   Collection Time: 09/21/23  4:01 PM  Result Value Ref Range   Vitamin B-12 260 200 - 1,100 pg/mL    Comment: . Please Note: Although the reference range for vitamin B12 is [phone] pg/mL, it has been reported that between 5 and 10% of patients with values between 200 and 400 pg/mL may experience neuropsychiatric and hematologic abnormalities due to occult B12 deficiency; less than 1% of patients with values above 400 pg/mL will have symptoms. .   COMPLETE METABOLIC PANEL WITH GFR     Status: Abnormal   Collection Time: 09/21/23  4:01 PM  Result Value Ref Range   Glucose, Bld 105 (H) 65 - 99 mg/dL    Comment: .            Fasting reference interval . For someone without known diabetes, a glucose value between 100 and 125 mg/dL is consistent with prediabetes and should be confirmed with a follow-up test. .    BUN 30 (H) 7 - 25 mg/dL   Creat 4.09 (H) 8.11 - 0.95 mg/dL   eGFR 30 (L) > OR = 60 mL/min/1.67m2   BUN/Creatinine Ratio 19 6 - 22 (calc)   Sodium 143 135 - 146 mmol/L   Potassium 4.9 3.5 - 5.3 mmol/L   Chloride 107 98 - 110 mmol/L   CO2 31 20 - 32 mmol/L   Calcium 9.2 8.6 - 10.4 mg/dL   Total Protein 6.7 6.1 - 8.1 g/dL    Albumin 4.0 3.6 - 5.1 g/dL   Globulin 2.7 1.9 - 3.7 g/dL (calc)   AG Ratio 1.5 1.0 - 2.5 (calc)   Total Bilirubin 0.4 0.2 - 1.2 mg/dL   Alkaline phosphatase (APISO) 87 37 - 153 U/L   AST 14 10 - 35 U/L   ALT 8 6 - 29 U/L  CBC w/Diff/Platelet     Status: Abnormal   Collection Time: 09/21/23  4:01 PM  Result Value Ref  Range   WBC 3.2 (L) 3.8 - 10.8 Thousand/uL   RBC 3.85 3.80 - 5.10 Million/uL   Hemoglobin 11.2 (L) 11.7 - 15.5 g/dL   HCT 16.1 09.6 - 04.5 %   MCV 91.7 80.0 - 100.0 fL   MCH 29.1 27.0 - 33.0 pg   MCHC 31.7 (L) 32.0 - 36.0 g/dL    Comment: For adults, a slight decrease in the calculated MCHC value (in the range of 30 to 32 g/dL) is most likely not clinically significant; however, it should be interpreted with caution in correlation with other red cell parameters and the patient's clinical condition.    RDW 12.9 11.0 - 15.0 %   Platelets 153 140 - 400 Thousand/uL   MPV 10.4 7.5 - 12.5 fL   Neutro Abs 1,853 1,500 - 7,800 cells/uL   Absolute Lymphocytes 1,040 850 - 3,900 cells/uL   Absolute Monocytes 259 200 - 950 cells/uL   Eosinophils Absolute 29 15 - 500 cells/uL   Basophils Absolute 19 0 - 200 cells/uL   Neutrophils Relative % 57.9 %   Total Lymphocyte 32.5 %   Monocytes Relative 8.1 %   Eosinophils Relative 0.9 %   Basophils Relative 0.6 %    Diabetic Foot Exam: {Perform Simple Foot Exam  Perform Detailed exam:1} {Insert foot Exam (Optional):30965}   PHQ2/9:    09/21/2023    3:11 PM 05/25/2023    2:46 PM 04/14/2023    2:39 PM 02/13/2023    2:52 PM 11/11/2022    3:14 PM  Depression screen PHQ 2/9  Decreased Interest 0 0 0 0 0  Down, Depressed, Hopeless 0 0 0 0 0  PHQ - 2 Score 0 0 0 0 0  Altered sleeping   0  3  Tired, decreased energy   0  0  Change in appetite   0  0  Feeling bad or failure about yourself    0  0  Trouble concentrating   0  0  Moving slowly or fidgety/restless   0  0  Suicidal thoughts   0  0  PHQ-9 Score   0  3    phq 9 is  negative   Fall Risk:    09/21/2023    3:10 PM 05/25/2023    2:42 PM 04/14/2023    2:23 PM 02/13/2023    3:00 PM 11/11/2022    3:14 PM  Fall Risk   Falls in the past year? 0 1 1 1  0  Number falls in past yr: 0 1 1 1  0  Injury with Fall? 0 0 1 0 0  Risk for fall due to : Impaired balance/gait;Impaired mobility Impaired balance/gait Impaired mobility;Impaired balance/gait Impaired balance/gait;Impaired mobility Impaired balance/gait  Follow up Falls prevention discussed Education provided;Falls prevention discussed Falls prevention discussed Falls prevention discussed Falls prevention discussed     Assessment & Plan  1. Moderate vascular dementia with anxiety (HCC) (Primary)  - traZODone (DESYREL) 50 MG tablet; Take 0.5-1 tablets (25-50 mg total) by mouth at bedtime as needed for sleep.  Dispense: 90 tablet; Refill: 0  2. Atherosclerosis of abdominal aorta (HCC)  Not on statin therapy due to age  37. Superior mesenteric artery stenosis (HCC)  Family states she gets agitated when taking too many pills, not on statin therapy , abdominal pain has improved   4. Mild protein-calorie malnutrition (HCC)  Still losing weight, discussed high calorie diet   5. Secondary renal hyperparathyroidism (HCC)  stable  6. Stage 3a chronic  kidney disease (HCC)  - lisinopril (ZESTRIL) 20 MG tablet; Take 1 tablet (20 mg total) by mouth daily.  Dispense: 90 tablet; Refill: 1  7. Pancytopenia (HCC)  Stable  8. Benign hypertension with chronic kidney disease, stage III (HCC)  - amLODipine (NORVASC) 10 MG tablet; Take 1 tablet (10 mg total) by mouth daily.  Dispense: 90 tablet; Refill: 1 - lisinopril (ZESTRIL) 20 MG tablet; Take 1 tablet (20 mg total) by mouth daily.  Dispense: 90 tablet; Refill: 1  9. Acquired hypothyroidism  - levothyroxine (SYNTHROID) 88 MCG tablet; Take 1 tablet (88 mcg total) by mouth daily before breakfast. And extra half on Sundays  Dispense: 100 tablet; Refill: 1  10.  Neuropathic pain of flank, right  Continue lidoderm patch  11. B12 deficiency  - cyanocobalamin (VITAMIN B12) injection 1,000 mcg - Cyanocobalamin (B-12) 500 MCG SUBL; Place 1 tablet under the tongue daily.  Dispense: 100 tablet; Refill: 1

## 2023-10-16 NOTE — Progress Notes (Signed)
Name: Lori Pacheco   MRN: [id number]    DOB: 19-Oct-1920   Date:10/16/2023       Progress Note  Subjective  Chief Complaint  Chief Complaint  Patient presents with   Medical Management of Chronic Issues    HPI  Patient came in today with her daughter [name] -lives next door and usually is the one that brings her to our office -  patient's youngest daughter - [name] - lives with the patient ). Ms. Perelman and daughters are aware of her advanced age and multiple medical problems that are not managed such as CKI and also MGUS and increase in cognitive dysfunction. She was discharged from palliative care due to her medical condition remaining stable. She is DNR . =She still hides her food but eats small portions sometimes    She used to have a lot of pruritus and has hydroxizine to take prn at home.    CKI stage III with secondary hyperparathyroidism: not willing to see nephrologist at this time, labs have been stable    CT abdomen done [facility] in 2015   1.  No evidence of bowel ischemia. 2.  Prominent calcified atheromatous plaque at the origin of the celiac artery and SMA resulting in mild-moderate stenosis, as above, but the distal branches remain patent. 3.  Small amount of free fluid in the pelvis of unknown etiology.    Re[facility] CT abdomen at [facility] 01/2020   1. Right lower lobe pulmonary embolism. This was discussed with the ordering ER physician. 2. 3.3 x 1.1 cm hypoattenuating lesion within the pancreatic head, favored to represent a sidebranch IPMN. In the setting of advanced age, no definite follow-up is necessary, however, this may be further evaluated with MRI/MRCP as clinically desired. 3. Small-volume abdominal pelvic ascites with diffuse mesenteric edema. Findings are nonspecific without identifiable cause by CT. 4. No evidence of bowel obstruction as clinically questioned.   DVT left leg: she had DVT on left leg, also history of PE. No calf tenderness or swelling.  She is off  Eliquis due to her age, recurrent falls and risk of bleeding, she denies calf pain, only has intermittent bilateral ankle edema   Atherosclerosis of aorta/ she also has mesenteric artery stenosis: she is no longer taking eliquis and refuses statin therapy, she has intermittent abdominal pain after meals , eating small amounts, weight is down 8 lbs from one year ago.    Pancreatic mass: found on CT abdomen, daughter and patient are aware of the findings of pancreatic mass on CT done at [facility] showed a mass, she denies any recent abdominal pain. Family agrees she would not tolerate surgery due to advance age She is now 89 . Stable    CT result :  PANCREAS: Lobulated hypoattenuating lesion within the pancreatic head measuring 3.3 x 1.1 cm (4:26). Top differential consideration would include a side branch IPMN.   Hypothyroidism: she is on levothyroxine 88 mcg 7 days a week and extra half on Sundays now, last TSH at goal.    Pancytopenia She used to go to the cancer center but no longer due to not wanting treatment or monitoring. Unchanged    Malnutrition: weight is down 8 lbs since last year, she eats a small meal once a day and has ice cream at night, it seems like abdominal pain improved after she started eating smaller portions    Vascular Dementia with anxiety and sundowning: daughter states she is having more agitation at the end of the  day, : she seems to be hallucinating intermittently , she talks to people that have already died, now hiding dirty underwear and food , sometimes she says that people have been in the house, but not afraid, she does not harm self or others   She was recently seen by Erin Mucin for sundowning and seems to be responding well to low dose Trazodone . She is unable to tolerate seroquel    Right flank pain/neuropathy: responds to lidoderm patch but she only uses it prn Doing well at this time. Occasionally still complains of pain but no longer going to [facility]. Stable     Patient Active Problem List   Diagnosis Date Noted   Mild vascular dementia with anxiety (HCC) 11/11/2022   Pancreatic mass 08/10/2022   History of recent fall 05/10/2022   Gastroesophageal reflux disease without esophagitis 05/10/2022   Chronic pain syndrome 05/09/2022   Palliative care patient 05/09/2022   Neuropathic pain of flank, right 05/09/2022   Benign hypertension with chronic kidney disease, stage III (HCC) 05/09/2022   Mild major depression (HCC) 05/09/2022   Stage 3a chronic kidney disease (HCC) 05/09/2022   Pancytopenia (HCC) 12/15/2020   Superior mesenteric artery stenosis (HCC) 03/02/2020   Stage 3b chronic kidney disease (HCC) 03/02/2020   History of pulmonary embolism 02/07/2020   Chronic abdominal pain 02/07/2020   Depression with anxiety 02/07/2020   Cognitive dysfunction 12/25/2018   Varicose veins of leg with pain, bilateral 12/04/2018   History of DVT (deep vein thrombosis) 10/17/2018   Other constipation 04/23/2018   Leg cramps 04/23/2018   Thrombocytopenia (HCC) 05/15/2017   Secondary renal hyperparathyroidism (HCC) 05/15/2017   Atherosclerosis of abdominal aorta (HCC) 11/10/2016   Perennial allergic rhinitis with seasonal variation 11/10/2016   Vitamin D deficiency 03/29/2016   Hypothyroidism 09/18/2015   Essential hypertension 05/19/2015   Hyperlipemia 05/19/2015    Past Surgical History:  Procedure Laterality Date   NO PAST SURGERIES      Family History  Problem Relation Age of Onset   Healthy Mother    Healthy Father    Heart disease Brother    Cancer Brother    Cancer Daughter    Alcohol abuse Son    Diabetes Son    Heart disease Son     Social History   Tobacco Use   Smoking status: Never   Smokeless tobacco: Former    Types: Snuff    Quit date: 09/27/1986   Tobacco comments:    smoking cessation materials not required  Substance Use Topics   Alcohol use: No    Alcohol/week: 0.0 standard drinks of alcohol     Current  Outpatient Medications:    acetaminophen (TYLENOL) 500 MG tablet, Take 1 tablet (500 mg total) by mouth every 6 (six) hours as needed., Disp: 100 tablet, Rfl: 0   amLODipine (NORVASC) 10 MG tablet, TAKE 1 TABLET EVERY DAY, Disp: 90 tablet, Rfl: 1   Blood Glucose-BP Monitor (BLOOD GLUCOSE-WRIST BP MONITOR) DEVI, 1 each by Does not apply route daily., Disp: 1 each, Rfl: 0   hydrOXYzine (ATARAX) 10 MG tablet, Take 1 tablet (10 mg total) by mouth at bedtime., Disp: 90 tablet, Rfl: 0   levothyroxine (SYNTHROID) 88 MCG tablet, Take 1 tablet (88 mcg total) by mouth daily before breakfast. And extra half on Sundays, Disp: 100 tablet, Rfl: 0   lidocaine (LIDODERM) 5 %, Place 2 patches onto the skin daily. (remove after 12 hours), Disp: 180 patch, Rfl: 1   lisinopril (ZESTRIL)  20 MG tablet, TAKE 1 TABLET EVERY DAY, Disp: 90 tablet, Rfl: 1   polyethylene glycol powder (GLYCOLAX/MIRALAX) 17 GM/SCOOP powder, Take 17 g by mouth daily with breakfast., Disp: 3350 g, Rfl: 1   traZODone (DESYREL) 50 MG tablet, Take 0.5-1 tablets (25-50 mg total) by mouth at bedtime as needed for sleep., Disp: 30 tablet, Rfl: 1  Allergies  Allergen Reactions   Lyrica [Pregabalin]     " I felt groggy"    I personally reviewed active problem list, medication list, allergies with the patient/caregiver today.   ROS Ten systems reviewed and is negative except as mentioned in HPI   Objective  Vitals:   10/16/23 1453  BP: (!) 140/82  Pulse: 78  Resp: 16  SpO2: 96%  Weight: 118 lb 14.4 oz (53.9 kg)  Height: 5\' 3"  (1.6 m)    Body mass index is 21.06 kg/m.  Physical Exam  Constitutional: Patient appears frail with temporal waisting  HEENT: head atraumatic, normocephalic, pupils equal and reactive to light, neck supple Cardiovascular: Normal rate, regular rhythm and normal heart sounds.  No murmur heard. No BLE edema. Pulmonary/Chest: Effort normal and breath sounds normal. No respiratory distress. Abdominal: Soft.   There is no tenderness. Psychiatric: Patient has a normal mood and affect. behavior is normal. Judgment and thought content normal.   Recent Results (from the past 2160 hours)  TSH     Status: None   Collection Time: 09/21/23  4:01 PM  Result Value Ref Range   TSH 1.89 0.40 - 4.50 mIU/L  T4     Status: None   Collection Time: 09/21/23  4:01 PM  Result Value Ref Range   T4, Total 9.7 5.1 - 11.9 mcg/dL  W11     Status: None   Collection Time: 09/21/23  4:01 PM  Result Value Ref Range   Vitamin B-12 260 200 - 1,100 pg/mL    Comment: . Please Note: Although the reference range for vitamin B12 is [phone] pg/mL, it has been reported that between 5 and 10% of patients with values between 200 and 400 pg/mL may experience neuropsychiatric and hematologic abnormalities due to occult B12 deficiency; less than 1% of patients with values above 400 pg/mL will have symptoms. .   COMPLETE METABOLIC PANEL WITH GFR     Status: Abnormal   Collection Time: 09/21/23  4:01 PM  Result Value Ref Range   Glucose, Bld 105 (H) 65 - 99 mg/dL    Comment: .            Fasting reference interval . For someone without known diabetes, a glucose value between 100 and 125 mg/dL is consistent with prediabetes and should be confirmed with a follow-up test. .    BUN 30 (H) 7 - 25 mg/dL   Creat 9.14 (H) 7.82 - 0.95 mg/dL   eGFR 30 (L) > OR = 60 mL/min/1.60m2   BUN/Creatinine Ratio 19 6 - 22 (calc)   Sodium 143 135 - 146 mmol/L   Potassium 4.9 3.5 - 5.3 mmol/L   Chloride 107 98 - 110 mmol/L   CO2 31 20 - 32 mmol/L   Calcium 9.2 8.6 - 10.4 mg/dL   Total Protein 6.7 6.1 - 8.1 g/dL   Albumin 4.0 3.6 - 5.1 g/dL   Globulin 2.7 1.9 - 3.7 g/dL (calc)   AG Ratio 1.5 1.0 - 2.5 (calc)   Total Bilirubin 0.4 0.2 - 1.2 mg/dL   Alkaline phosphatase (APISO) 87 37 - 153 U/L  AST 14 10 - 35 U/L   ALT 8 6 - 29 U/L  CBC w/Diff/Platelet     Status: Abnormal   Collection Time: 09/21/23  4:01 PM  Result Value Ref Range    WBC 3.2 (L) 3.8 - 10.8 Thousand/uL   RBC 3.85 3.80 - 5.10 Million/uL   Hemoglobin 11.2 (L) 11.7 - 15.5 g/dL   HCT 16.1 09.6 - 04.5 %   MCV 91.7 80.0 - 100.0 fL   MCH 29.1 27.0 - 33.0 pg   MCHC 31.7 (L) 32.0 - 36.0 g/dL    Comment: For adults, a slight decrease in the calculated MCHC value (in the range of 30 to 32 g/dL) is most likely not clinically significant; however, it should be interpreted with caution in correlation with other red cell parameters and the patient's clinical condition.    RDW 12.9 11.0 - 15.0 %   Platelets 153 140 - 400 Thousand/uL   MPV 10.4 7.5 - 12.5 fL   Neutro Abs 1,853 1,500 - 7,800 cells/uL   Absolute Lymphocytes 1,040 850 - 3,900 cells/uL   Absolute Monocytes 259 200 - 950 cells/uL   Eosinophils Absolute 29 15 - 500 cells/uL   Basophils Absolute 19 0 - 200 cells/uL   Neutrophils Relative % 57.9 %   Total Lymphocyte 32.5 %   Monocytes Relative 8.1 %   Eosinophils Relative 0.9 %   Basophils Relative 0.6 %    Diabetic Foot Exam:     PHQ2/9:    09/21/2023    3:11 PM 05/25/2023    2:46 PM 04/14/2023    2:39 PM 02/13/2023    2:52 PM 11/11/2022    3:14 PM  Depression screen PHQ 2/9  Decreased Interest 0 0 0 0 0  Down, Depressed, Hopeless 0 0 0 0 0  PHQ - 2 Score 0 0 0 0 0  Altered sleeping   0  3  Tired, decreased energy   0  0  Change in appetite   0  0  Feeling bad or failure about yourself    0  0  Trouble concentrating   0  0  Moving slowly or fidgety/restless   0  0  Suicidal thoughts   0  0  PHQ-9 Score   0  3    phq 9 is negative   Fall Risk:    09/21/2023    3:10 PM 05/25/2023    2:42 PM 04/14/2023    2:23 PM 02/13/2023    3:00 PM 11/11/2022    3:14 PM  Fall Risk   Falls in the past year? 0 1 1 1  0  Number falls in past yr: 0 1 1 1  0  Injury with Fall? 0 0 1 0 0  Risk for fall due to : Impaired balance/gait;Impaired mobility Impaired balance/gait Impaired mobility;Impaired balance/gait Impaired balance/gait;Impaired mobility  Impaired balance/gait  Follow up Falls prevention discussed Education provided;Falls prevention discussed Falls prevention discussed Falls prevention discussed Falls prevention discussed     Assessment & Plan  1. Moderate vascular dementia with anxiety (HCC) (Primary)  - traZODone (DESYREL) 50 MG tablet; Take 0.5-1 tablets (25-50 mg total) by mouth at bedtime as needed for sleep.  Dispense: 90 tablet; Refill: 0  2. Atherosclerosis of abdominal aorta (HCC)  Refuses statin therapy   3. Superior mesenteric artery stenosis (HCC)  No recent abdominal pain post prandially   4. Mild protein-calorie malnutrition (HCC)  Try to add high calorie diet  5. Secondary renal hyperparathyroidism (  HCC)  unchanged  6. Stage 3a chronic kidney disease (HCC)  - lisinopril (ZESTRIL) 20 MG tablet; Take 1 tablet (20 mg total) by mouth daily.  Dispense: 90 tablet; Refill: 1  7. Pancytopenia (HCC)  No longer seeing hematologist  8. Benign hypertension with chronic kidney disease, stage III (HCC)  - amLODipine (NORVASC) 10 MG tablet; Take 1 tablet (10 mg total) by mouth daily.  Dispense: 90 tablet; Refill: 1 - lisinopril (ZESTRIL) 20 MG tablet; Take 1 tablet (20 mg total) by mouth daily.  Dispense: 90 tablet; Refill: 1  9. Acquired hypothyroidism  - levothyroxine (SYNTHROID) 88 MCG tablet; Take 1 tablet (88 mcg total) by mouth daily before breakfast. And extra half on Sundays  Dispense: 100 tablet; Refill: 1  10. Neuropathic pain of flank, right  stable  11. B12 deficiency  - cyanocobalamin (VITAMIN B12) injection 1,000 mcg - Cyanocobalamin (B-12) 500 MCG SUBL; Place 1 tablet under the tongue daily.  Dispense: 100 tablet; Refill: 1

## 2023-11-10 ENCOUNTER — Emergency Department: Payer: Medicare PPO

## 2023-11-10 ENCOUNTER — Other Ambulatory Visit: Payer: Self-pay

## 2023-11-10 ENCOUNTER — Emergency Department
Admission: EM | Admit: 2023-11-10 | Discharge: 2023-11-10 | Disposition: A | Discharge status: Home / Self Care | Payer: Medicare PPO | Attending: Emergency Medicine | Admitting: Emergency Medicine

## 2023-11-10 DIAGNOSIS — E039 Hypothyroidism, unspecified: Secondary | ICD-10-CM | POA: Insufficient documentation

## 2023-11-10 DIAGNOSIS — S2242XA Multiple fractures of ribs, left side, initial encounter for closed fracture: Secondary | ICD-10-CM | POA: Diagnosis not present

## 2023-11-10 DIAGNOSIS — W19XXXA Unspecified fall, initial encounter: Secondary | ICD-10-CM | POA: Diagnosis not present

## 2023-11-10 DIAGNOSIS — I517 Cardiomegaly: Secondary | ICD-10-CM | POA: Diagnosis not present

## 2023-11-10 DIAGNOSIS — R0781 Pleurodynia: Secondary | ICD-10-CM | POA: Diagnosis not present

## 2023-11-10 DIAGNOSIS — I10 Essential (primary) hypertension: Secondary | ICD-10-CM | POA: Diagnosis not present

## 2023-11-10 DIAGNOSIS — S299XXA Unspecified injury of thorax, initial encounter: Secondary | ICD-10-CM | POA: Diagnosis present

## 2023-11-10 MED ORDER — TRAMADOL HCL 50 MG PO TABS: 50.0000 mg | ORAL_TABLET | Freq: Two times a day (BID) | ORAL | 0 refills | Status: AC

## 2023-11-10 MED ORDER — TRAMADOL HCL 50 MG PO TABS
50.0000 mg | ORAL_TABLET | Freq: Once | ORAL | Status: AC
  Administered 2023-11-10: 50 mg via ORAL
  Filled 2023-11-10: qty 1

## 2023-11-10 MED ORDER — LIDOCAINE 5 % EX PTCH
1.0000 | MEDICATED_PATCH | Freq: Once | CUTANEOUS | Status: DC
Start: 2023-11-10 — End: 2023-11-10
  Administered 2023-11-10: 1 via TRANSDERMAL
  Filled 2023-11-10: qty 1

## 2023-11-10 NOTE — Discharge Instructions (Addendum)
The rib fractures should heal in a few weeks. Until then, you will experience pain with movement, changing position, coughing, sneezing and bending. Take OTC Tylenol and the prescription pain medicine as needed. Apply warm compreses to the ribs when the Lidocaine patch is off. Use the Lidocaine patch every 12 hours, as directed. Use a pillow to hug while sneezing and coughing.

## 2023-11-10 NOTE — ED Triage Notes (Signed)
Pt comes with c/o left rib pain since Wed. Pt did have fall. Pt did not hit head or past out.

## 2023-11-10 NOTE — ED Provider Triage Note (Signed)
Emergency Medicine Provider Triage Evaluation Note  Lori Pacheco , a 89 y.o. female  was evaluated in triage.  Pt complains of fall on Wednesday and has rib pain. Did not hit her head.  Review of Systems  Positive: Rib pain Negative: sob  Physical Exam  Ht 5\' 3"  (1.6 m)   Wt 59 kg   BMI 23.03 kg/m  Gen:   Awake, no distress   Resp:  Normal effort  MSK:   Moves extremities without difficulty  Other:    Medical Decision Making  Medically screening exam initiated at 12:42 PM.  Appropriate orders placed.  Lori Pacheco was informed that the remainder of the evaluation will be completed by another provider, this initial triage assessment does not replace that evaluation, and the importance of remaining in the ED until their evaluation is complete.    Cameron Ali, PA-C 11/10/23 1242

## 2023-11-10 NOTE — ED Provider Notes (Signed)
[facility] Emergency Department Provider Note     Event Date/Time   First MD Initiated Contact with Patient 11/10/23 1524     (approximate)   History   Rib Injury   HPI  Lori Pacheco is a 89 y.o. female with a history of HTN, HLD, hypothyroidism, thrombocytopenia, and thyroid disease, presents to the ED via POV from home.  Patient presents after a mechanical fall 2 days prior.  She landed injuring her left chest wall.  She would endorse some left-sided rib pain since Wednesday.  No reports of any head injury or LOC.  No cough, congestion, or hemoptysis.  No other injury reported at this time.  Physical Exam   Triage Vital Signs: ED Triage Vitals  Encounter Vitals Group     BP 11/10/23 1241 (!) 143/82     Systolic BP Percentile --      Diastolic BP Percentile --      Pulse Rate 11/10/23 1241 71     Resp 11/10/23 1241 18     Temp 11/10/23 1241 97.8 F (36.6 C)     Temp src --      SpO2 11/10/23 1241 99 %     Weight 11/10/23 1240 130 lb (59 kg)     Height 11/10/23 1240 5\' 3"  (1.6 m)     Head Circumference --      Peak Flow --      Pain Score 11/10/23 1240 5     Pain Loc --      Pain Education --      Exclude from Growth Chart --     Most recent vital signs: Vitals:   11/10/23 1241  BP: (!) 143/82  Pulse: 71  Resp: 18  Temp: 97.8 F (36.6 C)  SpO2: 99%    General Awake, no distress. NAD HEENT NCAT. PERRL. EOMI. No rhinorrhea. Mucous membranes are moist.  CV:  Good peripheral perfusion. RRR RESP:  Normal effort. CTA.  No ecchymosis, bruising, or dyskinetic chest wall movement. ABD:  No distention. soft MSK:  Tender palpation over the left lateral chest wall.   ED Results / Procedures / Treatments   Labs (all labs ordered are listed, but only abnormal results are displayed) Labs Reviewed - No data to display   EKG   RADIOLOGY  I personally viewed and evaluated these images as part of my medical decision making, as  well as reviewing the written report by the radiologist.  ED Provider Interpretation: acute, nondisplaced 7-9 rib fractures  DG Ribs Unilateral W/Chest Left Result Date: 11/10/2023 CLINICAL DATA:  Status post fall 2 days ago with left rib pain EXAM: LEFT RIBS AND CHEST - 3 VIEW COMPARISON:  Chest radiograph dated 10/03/2018 FINDINGS: Minimally displaced left anterolateral seventh and eighth and nondisplaced ninth rib fractures. There is no evidence of pneumothorax or pleural effusion. Both lungs are clear. Enlarged cardiomediastinal silhouette. IMPRESSION: 1. Minimally displaced left anterolateral seventh and eighth and nondisplaced ninth rib fractures. No pneumothorax. 2. Cardiomegaly. Electronically Signed   By: Agustin Cree M.D.   On: 11/10/2023 13:59     PROCEDURES:  Critical Care performed: No  Procedures   MEDICATIONS ORDERED IN ED: Medications - No data to display   IMPRESSION / MDM / ASSESSMENT AND PLAN / ED COURSE  I reviewed the triage vital signs and the nursing notes.  Differential diagnosis includes, but is not limited to, rib fracture, chest contusion, pneumothorax, CAP  Patient's presentation is most consistent with acute complicated illness / injury requiring diagnostic workup.  Patient's diagnosis is consistent with mechanical fall resulting in multiple rib fractures on the left.  Patient with reassuring exam and workup at this time for no evidence of pneumothorax or CAP.  2-3 rib fractures are confirmed on exam.  Patient was stable vital signs without hypoxia on presentation.  Patient will be discharged home with prescriptions for Ultram. Patient is to follow up with the primary provider as discussed, as needed or otherwise directed. Patient is given ED precautions to return to the ED for any worsening or new symptoms    FINAL CLINICAL IMPRESSION(S) / ED DIAGNOSES   Final diagnoses:  Fall, initial encounter  Closed fracture of multiple  ribs of left side, initial encounter     Rx / DC Orders   ED Discharge Orders     None        Note:  This document was prepared using Dragon voice recognition software and may include unintentional dictation errors.    Lissa Hoard, PA-C 11/15/23 0000    Jene Every, MD 11/18/23 [phone]

## 2023-11-13 ENCOUNTER — Telehealth: Payer: Self-pay

## 2023-11-13 NOTE — Transitions of Care (Post Inpatient/ED Visit) (Signed)
11/13/2023  Name: Lori Pacheco MRN: [id number] DOB: 12-30-1920  Today's TOC FU Call Status: Today's TOC FU Call Status:: Successful TOC FU Call Completed TOC FU Call Complete Date: 11/13/23 Patient's Name and Date of Birth confirmed.  Transition Care Management Follow-up Telephone Call Date of Discharge: 11/10/23 Discharge Facility: [facility]) Type of Discharge: Emergency Department Reason for ED Visit: Other: (fall) How have you been since you were released from the hospital?: Better Any questions or concerns?: No  Items Reviewed: Did you receive and understand the discharge instructions provided?: Yes Medications obtained,verified, and reconciled?: Yes (Medications Reviewed) Any new allergies since your discharge?: No Dietary orders reviewed?: Yes Do you have support at home?: Yes People in Home: child(ren), adult  Medications Reviewed Today: Medications Reviewed Today     Reviewed by Karena Addison, LPN (Licensed Practical Nurse) on 11/13/23 at 1526  Med List Status: <None>   Medication Order Taking? Sig Documenting Provider Last Dose Status Informant  acetaminophen (TYLENOL) 500 MG tablet [id number] No Take 1 tablet (500 mg total) by mouth every 6 (six) hours as needed. Alba Cory, MD Taking Active Family Member  amLODipine (NORVASC) 10 MG tablet [id number]  Take 1 tablet (10 mg total) by mouth daily. Alba Cory, MD  Active   Blood Glucose-BP Monitor (BLOOD GLUCOSE-WRIST BP MONITOR) DEVI [id number] No 1 each by Does not apply route daily. Alba Cory, MD Taking Active   Cyanocobalamin (B-12) 500 MCG SUBL [id number]  Place 1 tablet under the tongue daily. Alba Cory, MD  Active   cyanocobalamin (VITAMIN B12) injection 1,000 mcg [id number]   Alba Cory, MD  Active   hydrOXYzine (ATARAX) 10 MG tablet [id number] No Take 1 tablet (10 mg total) by mouth at bedtime. Alba Cory, MD Taking Active   levothyroxine (SYNTHROID) 88  MCG tablet [id number]  Take 1 tablet (88 mcg total) by mouth daily before breakfast. And extra half on Sundays Alba Cory, MD  Active   lidocaine (LIDODERM) 5 % [id number] No Place 2 patches onto the skin daily. (remove after 12 hours) Alba Cory, MD Taking Active   lisinopril (ZESTRIL) 20 MG tablet [id number]  Take 1 tablet (20 mg total) by mouth daily. Alba Cory, MD  Active   polyethylene glycol powder [facility]) 17 GM/SCOOP powder [id number] No Take 17 g by mouth daily with breakfast. Alba Cory, MD Taking Active   traMADol (ULTRAM) 50 MG tablet [id number]  Take 1 tablet (50 mg total) by mouth 2 (two) times daily for 5 days. Menshew, Charlesetta Ivory, PA-C  Active   traZODone (DESYREL) 50 MG tablet [id number]  Take 0.5-1 tablets (25-50 mg total) by mouth at bedtime as needed for sleep. Alba Cory, MD  Active             Home Care and Equipment/Supplies: Were Home Health Services Ordered?: NA Any new equipment or medical supplies ordered?: NA  Functional Questionnaire: Do you need assistance with bathing/showering or dressing?: No Do you need assistance with meal preparation?: No Do you need assistance with eating?: No Do you have difficulty maintaining continence: No Do you need assistance with getting out of bed/getting out of a chair/moving?: No Do you have difficulty managing or taking your medications?: No  Follow up appointments reviewed: PCP Follow-up appointment confirmed?: Yes Date of PCP follow-up appointment?: 11/17/23 Follow-up Provider: [facility] Follow-up appointment confirmed?: NA Do you need transportation to your follow-up appointment?: No Do you understand care options if your condition(s) worsen?: Yes-patient verbalized  understanding    SIGNATURE Karena Addison, LPN [facility] Nurse Health Advisor Direct Dial [phone]

## 2023-11-17 ENCOUNTER — Ambulatory Visit: Payer: Medicare PPO | Admitting: Family Medicine

## 2023-11-17 ENCOUNTER — Encounter: Payer: Self-pay | Admitting: Family Medicine

## 2023-11-17 VITALS — BP 136/80 | HR 62 | Resp 16 | Ht 63.0 in | Wt 118.9 lb

## 2023-11-17 DIAGNOSIS — Z9181 History of falling: Secondary | ICD-10-CM | POA: Diagnosis not present

## 2023-11-17 DIAGNOSIS — R269 Unspecified abnormalities of gait and mobility: Secondary | ICD-10-CM | POA: Diagnosis not present

## 2023-11-17 DIAGNOSIS — M792 Neuralgia and neuritis, unspecified: Secondary | ICD-10-CM | POA: Diagnosis not present

## 2023-11-17 DIAGNOSIS — S2242XD Multiple fractures of ribs, left side, subsequent encounter for fracture with routine healing: Secondary | ICD-10-CM | POA: Diagnosis not present

## 2023-11-17 MED ORDER — LIDOCAINE 5 % EX PTCH: 2.0000 | MEDICATED_PATCH | CUTANEOUS | 1 refills | Status: DC

## 2023-11-17 NOTE — Progress Notes (Signed)
Name: Lori Pacheco   MRN: [id number]    DOB: August 10, 1921   Date:11/17/2023       Progress Note  Subjective  Chief Complaint  Chief Complaint  Patient presents with   Hospitalization Follow-up    Feeling better just pain bothers    HPI   Recent fall: she has a cane but usually does not use it while inside the house, she walks slowly and uses both hands on her cane to assist with gait. She got up from her chair wearing slippers and fell forward on 01/24 and was taken to [facility]. She was found to have left anterior rib fractures 7th 8 th mildly displaced and 9 th non displaced. She was in a lot of pain, she is doing better now. Using old lidoderm patches. Pain is intermittent now.   She uses the lidoderm patch for neuropathic pain and needs a refill  Discussed fall precaution, no open back shoes   Patient Active Problem List   Diagnosis Date Noted   Mild vascular dementia with anxiety (HCC) 11/11/2022   Pancreatic mass 08/10/2022   History of recent fall 05/10/2022   Gastroesophageal reflux disease without esophagitis 05/10/2022   Chronic pain syndrome 05/09/2022   Palliative care patient 05/09/2022   Neuropathic pain of flank, right 05/09/2022   Benign hypertension with chronic kidney disease, stage III (HCC) 05/09/2022   Mild major depression (HCC) 05/09/2022   Stage 3a chronic kidney disease (HCC) 05/09/2022   Pancytopenia (HCC) 12/15/2020   Superior mesenteric artery stenosis (HCC) 03/02/2020   Stage 3b chronic kidney disease (HCC) 03/02/2020   History of pulmonary embolism 02/07/2020   Chronic abdominal pain 02/07/2020   Depression with anxiety 02/07/2020   Cognitive dysfunction 12/25/2018   Varicose veins of leg with pain, bilateral 12/04/2018   History of DVT (deep vein thrombosis) 10/17/2018   Other constipation 04/23/2018   Leg cramps 04/23/2018   Thrombocytopenia (HCC) 05/15/2017   Secondary renal hyperparathyroidism (HCC) 05/15/2017   Atherosclerosis of abdominal aorta  (HCC) 11/10/2016   Perennial allergic rhinitis with seasonal variation 11/10/2016   Vitamin D deficiency 03/29/2016   Hypothyroidism 09/18/2015   Essential hypertension 05/19/2015   Hyperlipemia 05/19/2015    Past Surgical History:  Procedure Laterality Date   NO PAST SURGERIES      Family History  Problem Relation Age of Onset   Healthy Mother    Healthy Father    Heart disease Brother    Cancer Brother    Cancer Daughter    Alcohol abuse Son    Diabetes Son    Heart disease Son     Social History   Tobacco Use   Smoking status: Never   Smokeless tobacco: Former    Types: Snuff    Quit date: 09/27/1986   Tobacco comments:    smoking cessation materials not required  Substance Use Topics   Alcohol use: No    Alcohol/week: 0.0 standard drinks of alcohol     Current Outpatient Medications:    acetaminophen (TYLENOL) 500 MG tablet, Take 1 tablet (500 mg total) by mouth every 6 (six) hours as needed., Disp: 100 tablet, Rfl: 0   amLODipine (NORVASC) 10 MG tablet, Take 1 tablet (10 mg total) by mouth daily., Disp: 90 tablet, Rfl: 1   Blood Glucose-BP Monitor (BLOOD GLUCOSE-WRIST BP MONITOR) DEVI, 1 each by Does not apply route daily., Disp: 1 each, Rfl: 0   Cyanocobalamin (B-12) 500 MCG SUBL, Place 1 tablet under the tongue daily., Disp: 100  tablet, Rfl: 1   hydrOXYzine (ATARAX) 10 MG tablet, Take 1 tablet (10 mg total) by mouth at bedtime., Disp: 90 tablet, Rfl: 0   levothyroxine (SYNTHROID) 88 MCG tablet, Take 1 tablet (88 mcg total) by mouth daily before breakfast. And extra half on Sundays, Disp: 100 tablet, Rfl: 1   lidocaine (LIDODERM) 5 %, Place 2 patches onto the skin daily. (remove after 12 hours), Disp: 180 patch, Rfl: 1   lisinopril (ZESTRIL) 20 MG tablet, Take 1 tablet (20 mg total) by mouth daily., Disp: 90 tablet, Rfl: 1   polyethylene glycol powder (GLYCOLAX/MIRALAX) 17 GM/SCOOP powder, Take 17 g by mouth daily with breakfast., Disp: 3350 g, Rfl: 1    traZODone (DESYREL) 50 MG tablet, Take 0.5-1 tablets (25-50 mg total) by mouth at bedtime as needed for sleep., Disp: 90 tablet, Rfl: 0  Current Facility-Administered Medications:    cyanocobalamin (VITAMIN B12) injection 1,000 mcg, 1,000 mcg, Intramuscular, Once,   Allergies  Allergen Reactions   Lyrica [Pregabalin]     " I felt groggy"    I personally reviewed active problem list, medication list, allergies with the patient/caregiver today.   ROS  Ten systems reviewed and is negative except as mentioned in HPI   Objective  Vitals:   11/17/23 1041  BP: 136/80  Pulse: 62  Resp: 16  SpO2: 94%  Weight: 118 lb 14.4 oz (53.9 kg)  Height: 5\' 3"  (1.6 m)    Body mass index is 21.06 kg/m.  Physical Exam  Constitutional: Patient appears well-developed and well-nourished.  No distress.  HEENT: head atraumatic, normocephalic, pupils equal and reactive to light, , neck supple Cardiovascular: Normal rate, regular rhythm and normal heart sounds.  No murmur heard. No BLE edema. She did not have pain during palpation of left anterior chest  Pulmonary/Chest: Effort normal and breath sounds normal. No respiratory distress. Abdominal: Soft.  There is no tenderness. Psychiatric: Patient has a normal mood and affect. behavior is normal. Judgment and thought content normal.   Recent Results (from the past 2160 hours)  TSH     Status: None   Collection Time: 09/21/23  4:01 PM  Result Value Ref Range   TSH 1.89 0.40 - 4.50 mIU/L  T4     Status: None   Collection Time: 09/21/23  4:01 PM  Result Value Ref Range   T4, Total 9.7 5.1 - 11.9 mcg/dL  W09     Status: None   Collection Time: 09/21/23  4:01 PM  Result Value Ref Range   Vitamin B-12 260 200 - 1,100 pg/mL    Comment: . Please Note: Although the reference range for vitamin B12 is [phone] pg/mL, it has been reported that between 5 and 10% of patients with values between 200 and 400 pg/mL may experience neuropsychiatric and  hematologic abnormalities due to occult B12 deficiency; less than 1% of patients with values above 400 pg/mL will have symptoms. .   COMPLETE METABOLIC PANEL WITH GFR     Status: Abnormal   Collection Time: 09/21/23  4:01 PM  Result Value Ref Range   Glucose, Bld 105 (H) 65 - 99 mg/dL    Comment: .            Fasting reference interval . For someone without known diabetes, a glucose value between 100 and 125 mg/dL is consistent with prediabetes and should be confirmed with a follow-up test. .    BUN 30 (H) 7 - 25 mg/dL   Creat 8.11 (H) 9.14 -  0.95 mg/dL   eGFR 30 (L) > OR = 60 mL/min/1.65m2   BUN/Creatinine Ratio 19 6 - 22 (calc)   Sodium 143 135 - 146 mmol/L   Potassium 4.9 3.5 - 5.3 mmol/L   Chloride 107 98 - 110 mmol/L   CO2 31 20 - 32 mmol/L   Calcium 9.2 8.6 - 10.4 mg/dL   Total Protein 6.7 6.1 - 8.1 g/dL   Albumin 4.0 3.6 - 5.1 g/dL   Globulin 2.7 1.9 - 3.7 g/dL (calc)   AG Ratio 1.5 1.0 - 2.5 (calc)   Total Bilirubin 0.4 0.2 - 1.2 mg/dL   Alkaline phosphatase (APISO) 87 37 - 153 U/L   AST 14 10 - 35 U/L   ALT 8 6 - 29 U/L  CBC w/Diff/Platelet     Status: Abnormal   Collection Time: 09/21/23  4:01 PM  Result Value Ref Range   WBC 3.2 (L) 3.8 - 10.8 Thousand/uL   RBC 3.85 3.80 - 5.10 Million/uL   Hemoglobin 11.2 (L) 11.7 - 15.5 g/dL   HCT 40.9 81.1 - 91.4 %   MCV 91.7 80.0 - 100.0 fL   MCH 29.1 27.0 - 33.0 pg   MCHC 31.7 (L) 32.0 - 36.0 g/dL    Comment: For adults, a slight decrease in the calculated MCHC value (in the range of 30 to 32 g/dL) is most likely not clinically significant; however, it should be interpreted with caution in correlation with other red cell parameters and the patient's clinical condition.    RDW 12.9 11.0 - 15.0 %   Platelets 153 140 - 400 Thousand/uL   MPV 10.4 7.5 - 12.5 fL   Neutro Abs 1,853 1,500 - 7,800 cells/uL   Absolute Lymphocytes 1,040 850 - 3,900 cells/uL   Absolute Monocytes 259 200 - 950 cells/uL   Eosinophils  Absolute 29 15 - 500 cells/uL   Basophils Absolute 19 0 - 200 cells/uL   Neutrophils Relative % 57.9 %   Total Lymphocyte 32.5 %   Monocytes Relative 8.1 %   Eosinophils Relative 0.9 %   Basophils Relative 0.6 %    Diabetic Foot Exam:     PHQ2/9:    11/17/2023   10:40 AM 09/21/2023    3:11 PM 05/25/2023    2:46 PM 04/14/2023    2:39 PM 02/13/2023    2:52 PM  Depression screen PHQ 2/9  Decreased Interest 0 0 0 0 0  Down, Depressed, Hopeless 0 0 0 0 0  PHQ - 2 Score 0 0 0 0 0  Altered sleeping 0   0   Tired, decreased energy 0   0   Change in appetite 0   0   Feeling bad or failure about yourself  0   0   Trouble concentrating 0   0   Moving slowly or fidgety/restless 0   0   Suicidal thoughts 0   0   PHQ-9 Score 0   0   Difficult doing work/chores Not difficult at all        phq 9 is negative  Fall Risk:    11/17/2023   10:35 AM 09/21/2023    3:10 PM 05/25/2023    2:42 PM 04/14/2023    2:23 PM 02/13/2023    3:00 PM  Fall Risk   Falls in the past year? 1 0 1 1 1   Number falls in past yr: 0 0 1 1 1   Injury with Fall? 0 0 0 1 0  Risk  for fall due to : Impaired balance/gait Impaired balance/gait;Impaired mobility Impaired balance/gait Impaired mobility;Impaired balance/gait Impaired balance/gait;Impaired mobility  Follow up Falls prevention discussed;Education provided;Falls evaluation completed Falls prevention discussed Education provided;Falls prevention discussed Falls prevention discussed Falls prevention discussed     Assessment & Plan  1. History of recent fall (Primary)  Discussed fall precaution  2. Closed fracture of multiple ribs of left side with routine healing, subsequent encounter  Take vitamin D 2000 units daily, use lidoderm patch, avoid further falls  3. Gait difficulty  Advised rollator  with a chair to use at least when out of the house  4. Neuropathic pain of flank, right  - lidocaine (LIDODERM) 5 %; Place 2 patches onto the skin daily.  (remove after 12 hours)  Dispense: 180 patch; Refill: 1

## 2023-11-20 ENCOUNTER — Other Ambulatory Visit: Payer: Self-pay

## 2023-11-20 DIAGNOSIS — M792 Neuralgia and neuritis, unspecified: Secondary | ICD-10-CM

## 2023-11-20 MED ORDER — LIDOCAINE 5 % EX PTCH: 1.0000 | MEDICATED_PATCH | Freq: Two times a day (BID) | CUTANEOUS | 1 refills | Status: AC

## 2023-11-24 DIAGNOSIS — R269 Unspecified abnormalities of gait and mobility: Secondary | ICD-10-CM | POA: Diagnosis not present

## 2023-12-06 ENCOUNTER — Ambulatory Visit: Payer: Medicare PPO | Admitting: Family Medicine

## 2023-12-06 ENCOUNTER — Encounter: Payer: Self-pay | Admitting: Family Medicine

## 2023-12-06 VITALS — BP 142/70 | HR 64 | Resp 16 | Ht 63.0 in | Wt 118.4 lb

## 2023-12-06 DIAGNOSIS — R32 Unspecified urinary incontinence: Secondary | ICD-10-CM

## 2023-12-06 DIAGNOSIS — F01B4 Vascular dementia, moderate, with anxiety: Secondary | ICD-10-CM | POA: Diagnosis not present

## 2023-12-06 DIAGNOSIS — I10 Essential (primary) hypertension: Secondary | ICD-10-CM

## 2023-12-06 DIAGNOSIS — Z9181 History of falling: Secondary | ICD-10-CM | POA: Diagnosis not present

## 2023-12-06 DIAGNOSIS — E039 Hypothyroidism, unspecified: Secondary | ICD-10-CM | POA: Diagnosis not present

## 2023-12-06 MED ORDER — OLANZAPINE 2.5 MG PO TABS: 2.5000 mg | ORAL_TABLET | Freq: Every day | ORAL | 0 refills | Status: DC

## 2023-12-06 NOTE — Progress Notes (Signed)
 Name: Lori Pacheco   MRN: [id number]    DOB: 1921-08-04   Date:12/06/2023       Progress Note  Subjective  Chief Complaint  Chief Complaint  Patient presents with   Altered Mental Status    Unable to sit still, ongoing since previous visit but has got worst    Discussed the use of AI scribe software for clinical note transcription with the patient, who gave verbal consent to proceed.  History of Present Illness   Lori Pacheco is a 89 year old female with moderate vascular dementia who presents with increased agitation and confusion.  She has been experiencing increased confusion and agitation, characterized by an inability to stay still, becoming rowdy, and pulling at things. These symptoms occur at any time of the day and have been ongoing since her last visit a month ago. Previously prescribed medications for agitation, such as hydroxyzine, seroquel, and Trazodone for sleep, have been inconsistently effective. She does not seem to have any pain or discomfort, no change in urinary frequency or bowel movements, appetite is unchanged  Her blood pressure has been elevated recently, which may be related to her agitation and inconsistent medication adherence. She is currently on lisinopril and amlodipine for hypertension, and levothyroxine for thyroid management. Her thyroid levels were stable in December, and her kidney function has been consistently low with a GFR of 30. She is not refusing her medications outright but sometimes does not take them, which may contribute to her elevated blood pressure.  She has a history of falls, with the most recent occurring early January, leading to emergency room visits. She sustained two misplaced fractures and three broken ribs. She uses a Lidoderm patch for pain management, which she reports is not causing her current agitation.  She experiences bladder incontinence, particularly at night, requiring the use of two pull-ups per day. This has been a  longstanding issue, attributed to her slow mobility and inability to reach the bathroom in time.        Patient Active Problem List   Diagnosis Date Noted   Mild vascular dementia with anxiety (HCC) 11/11/2022   Pancreatic mass 08/10/2022   History of recent fall 05/10/2022   Gastroesophageal reflux disease without esophagitis 05/10/2022   Chronic pain syndrome 05/09/2022   Palliative care patient 05/09/2022   Neuropathic pain of flank, right 05/09/2022   Benign hypertension with chronic kidney disease, stage III (HCC) 05/09/2022   Mild major depression (HCC) 05/09/2022   Stage 3a chronic kidney disease (HCC) 05/09/2022   Pancytopenia (HCC) 12/15/2020   Superior mesenteric artery stenosis (HCC) 03/02/2020   Stage 3b chronic kidney disease (HCC) 03/02/2020   History of pulmonary embolism 02/07/2020   Chronic abdominal pain 02/07/2020   Depression with anxiety 02/07/2020   Cognitive dysfunction 12/25/2018   Varicose veins of leg with pain, bilateral 12/04/2018   History of DVT (deep vein thrombosis) 10/17/2018   Other constipation 04/23/2018   Leg cramps 04/23/2018   Thrombocytopenia (HCC) 05/15/2017   Secondary renal hyperparathyroidism (HCC) 05/15/2017   Atherosclerosis of abdominal aorta (HCC) 11/10/2016   Perennial allergic rhinitis with seasonal variation 11/10/2016   Vitamin D deficiency 03/29/2016   Hypothyroidism 09/18/2015   Essential hypertension 05/19/2015   Hyperlipemia 05/19/2015    Social History   Tobacco Use   Smoking status: Never   Smokeless tobacco: Former    Types: Snuff    Quit date: 09/27/1986   Tobacco comments:    smoking cessation materials not required  Substance Use Topics   Alcohol use: No    Alcohol/week: 0.0 standard drinks of alcohol     Current Outpatient Medications:    acetaminophen (TYLENOL) 500 MG tablet, Take 1 tablet (500 mg total) by mouth every 6 (six) hours as needed., Disp: 100 tablet, Rfl: 0   amLODipine (NORVASC) 10 MG  tablet, Take 1 tablet (10 mg total) by mouth daily., Disp: 90 tablet, Rfl: 1   Blood Glucose-BP Monitor (BLOOD GLUCOSE-WRIST BP MONITOR) DEVI, 1 each by Does not apply route daily., Disp: 1 each, Rfl: 0   Cyanocobalamin (B-12) 500 MCG SUBL, Place 1 tablet under the tongue daily., Disp: 100 tablet, Rfl: 1   levothyroxine (SYNTHROID) 88 MCG tablet, Take 1 tablet (88 mcg total) by mouth daily before breakfast. And extra half on Sundays, Disp: 100 tablet, Rfl: 1   lidocaine (LIDODERM) 5 %, Place 1 patch onto the skin every 12 (twelve) hours. (remove after 12 hours), Disp: 90 patch, Rfl: 1   lisinopril (ZESTRIL) 20 MG tablet, Take 1 tablet (20 mg total) by mouth daily., Disp: 90 tablet, Rfl: 1   OLANZapine (ZYPREXA) 2.5 MG tablet, Take 1 tablet (2.5 mg total) by mouth at bedtime., Disp: 30 tablet, Rfl: 0   polyethylene glycol powder (GLYCOLAX/MIRALAX) 17 GM/SCOOP powder, Take 17 g by mouth daily with breakfast., Disp: 3350 g, Rfl: 1   traZODone (DESYREL) 50 MG tablet, Take 0.5-1 tablets (25-50 mg total) by mouth at bedtime as needed for sleep., Disp: 90 tablet, Rfl: 0  Current Facility-Administered Medications:    cyanocobalamin (VITAMIN B12) injection 1,000 mcg, 1,000 mcg, Intramuscular, Once,   Allergies  Allergen Reactions   Lyrica [Pregabalin]     " I felt groggy"    ROS  Ten systems reviewed and is negative except as mentioned in HPI    Objective  Vitals:   12/06/23 0902  BP: (!) 168/74  Pulse: 64  Resp: 16  Weight: 118 lb 6.4 oz (53.7 kg)  Height: 5\' 3"  (1.6 m)    Body mass index is 20.97 kg/m.    Physical Exam  Constitutional: Patient appears frail, sitting and in no distress HEENT: head atraumatic, normocephalic Cardiovascular: Normal rate, regular rhythm and normal heart sounds.  3/6 harsh Systolic  murmur heard. No BLE edema. Pulmonary/Chest: Effort normal and breath sounds normal. No respiratory distress. Abdominal: Soft.  There is no tenderness. Psychiatric:  cooperative, calm   Recent Results (from the past 2160 hours)  TSH     Status: None   Collection Time: 09/21/23  4:01 PM  Result Value Ref Range   TSH 1.89 0.40 - 4.50 mIU/L  T4     Status: None   Collection Time: 09/21/23  4:01 PM  Result Value Ref Range   T4, Total 9.7 5.1 - 11.9 mcg/dL  F62     Status: None   Collection Time: 09/21/23  4:01 PM  Result Value Ref Range   Vitamin B-12 260 200 - 1,100 pg/mL    Comment: . Please Note: Although the reference range for vitamin B12 is [phone] pg/mL, it has been reported that between 5 and 10% of patients with values between 200 and 400 pg/mL may experience neuropsychiatric and hematologic abnormalities due to occult B12 deficiency; less than 1% of patients with values above 400 pg/mL will have symptoms. .   COMPLETE METABOLIC PANEL WITH GFR     Status: Abnormal   Collection Time: 09/21/23  4:01 PM  Result Value Ref Range   Glucose, Bld  105 (H) 65 - 99 mg/dL    Comment: .            Fasting reference interval . For someone without known diabetes, a glucose value between 100 and 125 mg/dL is consistent with prediabetes and should be confirmed with a follow-up test. .    BUN 30 (H) 7 - 25 mg/dL   Creat 5.78 (H) 4.69 - 0.95 mg/dL   eGFR 30 (L) > OR = 60 mL/min/1.74m2   BUN/Creatinine Ratio 19 6 - 22 (calc)   Sodium 143 135 - 146 mmol/L   Potassium 4.9 3.5 - 5.3 mmol/L   Chloride 107 98 - 110 mmol/L   CO2 31 20 - 32 mmol/L   Calcium 9.2 8.6 - 10.4 mg/dL   Total Protein 6.7 6.1 - 8.1 g/dL   Albumin 4.0 3.6 - 5.1 g/dL   Globulin 2.7 1.9 - 3.7 g/dL (calc)   AG Ratio 1.5 1.0 - 2.5 (calc)   Total Bilirubin 0.4 0.2 - 1.2 mg/dL   Alkaline phosphatase (APISO) 87 37 - 153 U/L   AST 14 10 - 35 U/L   ALT 8 6 - 29 U/L  CBC w/Diff/Platelet     Status: Abnormal   Collection Time: 09/21/23  4:01 PM  Result Value Ref Range   WBC 3.2 (L) 3.8 - 10.8 Thousand/uL   RBC 3.85 3.80 - 5.10 Million/uL   Hemoglobin 11.2 (L) 11.7 - 15.5 g/dL    HCT 62.9 52.8 - 41.3 %   MCV 91.7 80.0 - 100.0 fL   MCH 29.1 27.0 - 33.0 pg   MCHC 31.7 (L) 32.0 - 36.0 g/dL    Comment: For adults, a slight decrease in the calculated MCHC value (in the range of 30 to 32 g/dL) is most likely not clinically significant; however, it should be interpreted with caution in correlation with other red cell parameters and the patient's clinical condition.    RDW 12.9 11.0 - 15.0 %   Platelets 153 140 - 400 Thousand/uL   MPV 10.4 7.5 - 12.5 fL   Neutro Abs 1,853 1,500 - 7,800 cells/uL   Absolute Lymphocytes 1,040 850 - 3,900 cells/uL   Absolute Monocytes 259 200 - 950 cells/uL   Eosinophils Absolute 29 15 - 500 cells/uL   Basophils Absolute 19 0 - 200 cells/uL   Neutrophils Relative % 57.9 %   Total Lymphocyte 32.5 %   Monocytes Relative 8.1 %   Eosinophils Relative 0.9 %   Basophils Relative 0.6 %     Assessment and Plan    Behavioral changes in moderate vascular dementia Increased agitation and restlessness. No clear temporal pattern. No associated pain or discomfort. Trazodone inconsistently effective. Hydroxyzine discontinued due to fall risk. daughter agrees on treating symptoms and not check any labs or do further tests at this time, she is 89 years old and they are focusing on comfort measures -Start Olanzapine 2.5mg  in the evening for behavioral changes associated with dementia.  Rib fractures No current pain reported. Using Lidoderm patch for pain management. -Continue Lidoderm patch as needed.  Urinary incontinence Chronic issue, worse at night. Using approximately 2-3 undergarments per day. -Attempt to order undergarments through insurance.  Hypertension Blood pressure elevated, possibly due to agitation and inconsistent medication use. -Continue Lisinopril and Amlodipine as tolerated.  Hypothyroidism Stable on Levothyroxine. -Continue Levothyroxine.  Follow-up Monitor response to Olanzapine. Reevaluate if agitation persists  or worsens.

## 2023-12-11 ENCOUNTER — Telehealth: Payer: Self-pay

## 2023-12-11 DIAGNOSIS — F01B4 Vascular dementia, moderate, with anxiety: Secondary | ICD-10-CM

## 2023-12-11 NOTE — Telephone Encounter (Signed)
 Called pt daughter to inform her insurance will not cover the adult pull up/diapers for patient.   Pt daughter wanted me to inform PCP OLANZapine (ZYPREXA) 2.5 MG medication is not helping patient, no difference.

## 2023-12-11 NOTE — Telephone Encounter (Signed)
 Pt daughter [name] informed verbalized understanding

## 2023-12-15 ENCOUNTER — Ambulatory Visit: Payer: Self-pay | Admitting: Family Medicine

## 2023-12-15 NOTE — Telephone Encounter (Signed)
 Copied From CRM [id number]. Reason for Triage: unstable, medication OLANZapine (ZYPREXA) 2.5 MG tablet not working. She is all over the place. They have to restrain her some time per [facility] the daughter. Please advise.  Chief Complaint: Medication not working Symptoms: Aggressive behavior, difficulty sleeping Frequency: Ongoing Pertinent Negatives: Patient denies falls/injury Disposition: [] ED /[] Urgent Care (no appt availability in office) / [] Appointment(In office/virtual)/ []  [facility]/ [] Home Care/ [] Refused Recommended Disposition /[] [facility]/ [x]  Follow-up with PCP Additional Notes: This RN spoke to patient's daughter, [name], on behalf of the patient. Patient was seen in the office on 12/06/23 and treated for behavioral changes associated with dementia. Patient has been taking two tablets of Olanzapine, as advised by her PCP. Daughter states increasing the dosage to two pills a day is still not helping. Daughter reports her mother is aggressive and not sleeping. Daughter is afraid that patient is going to hurt herself and has been having to restrain her to a chair. Daughter denied recent falls and injury. Daughter is requesting advice from PCP on how to proceed. Patient is requesting a call back.  Reason for Disposition  [1] Caller has NON-URGENT medicine question about med that PCP prescribed AND [2] triager unable to answer question  Answer Assessment - Initial Assessment Questions 1. NAME of MEDICINE: "What medicine(s) are you calling about?"     Olanzapine 2. QUESTION: "What is your question?" (e.g., double dose of medicine, side effect)     Daughter states she has doubled the medication, as advised, and does not notice a change in behavior of the patient 3. PRESCRIBER: "Who prescribed the medicine?" Reason: if prescribed by specialist, call should be referred to that group.     Dr. Carlynn Purl 4. SYMPTOMS: "Do you have any symptoms?" If Yes, ask: "What symptoms are  you having?"  "How bad are the symptoms (e.g., mild, moderate, severe)     Daughter reports that the patient is still exhibiting aggressive behavior, having outbursts and not sleeping  Protocols used: Medication Question Call-A-AH

## 2023-12-30 ENCOUNTER — Other Ambulatory Visit: Payer: Self-pay | Admitting: Family Medicine

## 2023-12-30 DIAGNOSIS — F01B4 Vascular dementia, moderate, with anxiety: Secondary | ICD-10-CM

## 2024-01-15 ENCOUNTER — Ambulatory Visit: Payer: Self-pay | Admitting: Family Medicine

## 2024-01-15 ENCOUNTER — Encounter: Payer: Self-pay | Admitting: Family Medicine

## 2024-01-15 VITALS — BP 134/64 | HR 63 | Resp 16 | Ht 63.0 in | Wt 118.8 lb

## 2024-01-15 DIAGNOSIS — F01B4 Vascular dementia, moderate, with anxiety: Secondary | ICD-10-CM

## 2024-01-15 DIAGNOSIS — R54 Age-related physical debility: Secondary | ICD-10-CM

## 2024-01-15 DIAGNOSIS — I10 Essential (primary) hypertension: Secondary | ICD-10-CM

## 2024-01-15 MED ORDER — OLANZAPINE 5 MG PO TABS
5.0000 mg | ORAL_TABLET | Freq: Every day | ORAL | 0 refills | Status: DC
Start: 2024-01-15 — End: 2024-04-09

## 2024-01-15 NOTE — Progress Notes (Signed)
 Name: Lori Pacheco   MRN: [id number]    DOB: 1920-10-29   Date:01/15/2024       Progress Note  Subjective  Chief Complaint  Chief Complaint  Patient presents with   Medical Management of Chronic Issues   HPI   HTN: she has been taking her medication, norvasc 10 mg daily and lisinopril 20 mg , bp is still elevated today but improved when compared to last visit. She is not agitated during her visit. Denies chest pain or palpitation.   Dementia with agitation: today I found out that the daughter that stays with her during the day works night shifts. Explained that patient needs to have someone that can be awake so she can be monitored. We cannot make her sleep all day and is it no safe for either of them. We discussed getting other sibling to rotate during the week ( may need FMLA to take one day off weekly to care for their mother ) or pay for a sitter so the sister that works 3 rd shift can sleep for at least 6 hours without interruption. Patient does not qualify for CAP services and family refuses nursing home placement . Patient has visual hallucinations, picks on her clothe and moves around the house but is not aggressive and does not wander. She has not been in pain. Family asked to increase Zyprexa to 5 mg to help her stay asleep a little longer and we will try  Patient Active Problem List   Diagnosis Date Noted   Mild vascular dementia with anxiety (HCC) 11/11/2022   Pancreatic mass 08/10/2022   History of recent fall 05/10/2022   Gastroesophageal reflux disease without esophagitis 05/10/2022   Chronic pain syndrome 05/09/2022   Palliative care patient 05/09/2022   Neuropathic pain of flank, right 05/09/2022   Benign hypertension with chronic kidney disease, stage III (HCC) 05/09/2022   Mild major depression (HCC) 05/09/2022   Stage 3a chronic kidney disease (HCC) 05/09/2022   Pancytopenia (HCC) 12/15/2020   Superior mesenteric artery stenosis (HCC) 03/02/2020   Stage 3b chronic  kidney disease (HCC) 03/02/2020   History of pulmonary embolism 02/07/2020   Chronic abdominal pain 02/07/2020   Depression with anxiety 02/07/2020   Cognitive dysfunction 12/25/2018   Varicose veins of leg with pain, bilateral 12/04/2018   History of DVT (deep vein thrombosis) 10/17/2018   Other constipation 04/23/2018   Leg cramps 04/23/2018   Thrombocytopenia (HCC) 05/15/2017   Secondary renal hyperparathyroidism (HCC) 05/15/2017   Atherosclerosis of abdominal aorta (HCC) 11/10/2016   Perennial allergic rhinitis with seasonal variation 11/10/2016   Vitamin D deficiency 03/29/2016   Hypothyroidism 09/18/2015   Essential hypertension 05/19/2015   Hyperlipemia 05/19/2015    Past Surgical History:  Procedure Laterality Date   NO PAST SURGERIES      Family History  Problem Relation Age of Onset   Healthy Mother    Healthy Father    Heart disease Brother    Cancer Brother    Cancer Daughter    Alcohol abuse Son    Diabetes Son    Heart disease Son     Social History   Tobacco Use   Smoking status: Never   Smokeless tobacco: Former    Types: Snuff    Quit date: 09/27/1986   Tobacco comments:    smoking cessation materials not required  Substance Use Topics   Alcohol use: No    Alcohol/week: 0.0 standard drinks of alcohol     Current Outpatient Medications:  acetaminophen (TYLENOL) 500 MG tablet, Take 1 tablet (500 mg total) by mouth every 6 (six) hours as needed., Disp: 100 tablet, Rfl: 0   amLODipine (NORVASC) 10 MG tablet, Take 1 tablet (10 mg total) by mouth daily., Disp: 90 tablet, Rfl: 1   Blood Glucose-BP Monitor (BLOOD GLUCOSE-WRIST BP MONITOR) DEVI, 1 each by Does not apply route daily., Disp: 1 each, Rfl: 0   Cyanocobalamin (B-12) 500 MCG SUBL, Place 1 tablet under the tongue daily., Disp: 100 tablet, Rfl: 1   levothyroxine (SYNTHROID) 88 MCG tablet, Take 1 tablet (88 mcg total) by mouth daily before breakfast. And extra half on Sundays, Disp: 100  tablet, Rfl: 1   lidocaine (LIDODERM) 5 %, Place 1 patch onto the skin every 12 (twelve) hours. (remove after 12 hours), Disp: 90 patch, Rfl: 1   lisinopril (ZESTRIL) 20 MG tablet, Take 1 tablet (20 mg total) by mouth daily., Disp: 90 tablet, Rfl: 1   polyethylene glycol powder (GLYCOLAX/MIRALAX) 17 GM/SCOOP powder, Take 17 g by mouth daily with breakfast., Disp: 3350 g, Rfl: 1   OLANZapine (ZYPREXA) 5 MG tablet, Take 1 tablet (5 mg total) by mouth at bedtime., Disp: 90 tablet, Rfl: 0  Current Facility-Administered Medications:    cyanocobalamin (VITAMIN B12) injection 1,000 mcg, 1,000 mcg, Intramuscular, Once,   Allergies  Allergen Reactions   Lyrica [Pregabalin]     " I felt groggy"    I personally reviewed active problem list, medication list, allergies with the patient/caregiver today.   ROS  Ten systems reviewed and is negative except as mentioned in HPI    Objective Physical Exam Constitutional: Patient appears frail  HEENT: head atraumatic, normocephalic Cardiovascular: Normal rate, regular rhythm and normal heart sounds.  No murmur heard. No BLE edema. Pulmonary/Chest: Effort normal and breath sounds normal. No respiratory distress. Abdominal: Soft.  There is no tenderness. Psychiatric: smiling and cooperative   Vitals:   01/15/24 1539 01/15/24 1604  BP: (!) 154/82 134/64  Pulse: 63   Resp: 16   SpO2: 100%   Weight: 118 lb 12.8 oz (53.9 kg)   Height: 5\' 3"  (1.6 m)     Body mass index is 21.04 kg/m.  No results found for this or any previous visit (from the past 2160 hours).  Diabetic Foot Exam:     PHQ2/9:    01/15/2024    3:40 PM 12/06/2023    9:05 AM 11/17/2023   10:40 AM 09/21/2023    3:11 PM 05/25/2023    2:46 PM  Depression screen PHQ 2/9  Decreased Interest 0 0 0 0 0  Down, Depressed, Hopeless 0 0 0 0 0  PHQ - 2 Score 0 0 0 0 0  Altered sleeping 0 0 0    Tired, decreased energy 0 0 0    Change in appetite 0 0 0    Feeling bad or failure about  yourself  0 0 0    Trouble concentrating 0 0 0    Moving slowly or fidgety/restless 0 0 0    Suicidal thoughts 0 0 0    PHQ-9 Score 0 0 0    Difficult doing work/chores Not difficult at all Not difficult at all Not difficult at all      phq 9 is negative  Fall Risk:    11/17/2023   10:35 AM 09/21/2023    3:10 PM 05/25/2023    2:42 PM 04/14/2023    2:23 PM 02/13/2023    3:00 PM  Fall Risk  Falls in the past year? 1 0 1 1 1   Number falls in past yr: 0 0 1 1 1   Injury with Fall? 0 0 0 1 0  Risk for fall due to : Impaired balance/gait Impaired balance/gait;Impaired mobility Impaired balance/gait Impaired mobility;Impaired balance/gait Impaired balance/gait;Impaired mobility  Follow up Falls prevention discussed;Education provided;Falls evaluation completed Falls prevention discussed Education provided;Falls prevention discussed Falls prevention discussed Falls prevention discussed     Assessment & Plan  1. Moderate vascular dementia with anxiety (HCC) (Primary)  - OLANZapine (ZYPREXA) 5 MG tablet; Take 1 tablet (5 mg total) by mouth at bedtime.  Dispense: 90 tablet; Refill: 0  2. Essential hypertension  BP was elevated but we will continue current dose for now , she has OA and uses a cane to walk in and BP improved with rest  3. Frail elderly  Discussed importance of 24 hour care

## 2024-03-01 ENCOUNTER — Other Ambulatory Visit: Payer: Self-pay | Admitting: Family Medicine

## 2024-03-01 DIAGNOSIS — I129 Hypertensive chronic kidney disease with stage 1 through stage 4 chronic kidney disease, or unspecified chronic kidney disease: Secondary | ICD-10-CM

## 2024-03-01 DIAGNOSIS — N1831 Chronic kidney disease, stage 3a: Secondary | ICD-10-CM

## 2024-03-09 ENCOUNTER — Other Ambulatory Visit: Payer: Self-pay | Admitting: Family Medicine

## 2024-03-09 DIAGNOSIS — N183 Chronic kidney disease, stage 3 unspecified: Secondary | ICD-10-CM

## 2024-03-09 DIAGNOSIS — F01B4 Vascular dementia, moderate, with anxiety: Secondary | ICD-10-CM

## 2024-03-09 DIAGNOSIS — N1831 Chronic kidney disease, stage 3a: Secondary | ICD-10-CM

## 2024-03-09 DIAGNOSIS — E039 Hypothyroidism, unspecified: Secondary | ICD-10-CM

## 2024-03-18 ENCOUNTER — Other Ambulatory Visit: Payer: Self-pay | Admitting: Family Medicine

## 2024-03-18 DIAGNOSIS — I129 Hypertensive chronic kidney disease with stage 1 through stage 4 chronic kidney disease, or unspecified chronic kidney disease: Secondary | ICD-10-CM

## 2024-03-18 DIAGNOSIS — N1831 Chronic kidney disease, stage 3a: Secondary | ICD-10-CM

## 2024-03-24 ENCOUNTER — Other Ambulatory Visit: Payer: Self-pay | Admitting: Family Medicine

## 2024-03-24 DIAGNOSIS — F01B4 Vascular dementia, moderate, with anxiety: Secondary | ICD-10-CM

## 2024-04-09 ENCOUNTER — Encounter: Payer: Self-pay | Admitting: Family Medicine

## 2024-04-09 ENCOUNTER — Ambulatory Visit: Admitting: Family Medicine

## 2024-04-09 VITALS — BP 154/72 | HR 64 | Resp 16 | Ht 63.0 in | Wt 116.4 lb

## 2024-04-09 DIAGNOSIS — D61818 Other pancytopenia: Secondary | ICD-10-CM

## 2024-04-09 DIAGNOSIS — F01B11 Vascular dementia, moderate, with agitation: Secondary | ICD-10-CM | POA: Diagnosis not present

## 2024-04-09 DIAGNOSIS — N1832 Chronic kidney disease, stage 3b: Secondary | ICD-10-CM | POA: Diagnosis not present

## 2024-04-09 DIAGNOSIS — E039 Hypothyroidism, unspecified: Secondary | ICD-10-CM

## 2024-04-09 DIAGNOSIS — E538 Deficiency of other specified B group vitamins: Secondary | ICD-10-CM | POA: Diagnosis not present

## 2024-04-09 DIAGNOSIS — I700 Atherosclerosis of aorta: Secondary | ICD-10-CM | POA: Diagnosis not present

## 2024-04-09 DIAGNOSIS — N183 Chronic kidney disease, stage 3 unspecified: Secondary | ICD-10-CM

## 2024-04-09 DIAGNOSIS — N2581 Secondary hyperparathyroidism of renal origin: Secondary | ICD-10-CM

## 2024-04-09 DIAGNOSIS — K5909 Other constipation: Secondary | ICD-10-CM

## 2024-04-09 DIAGNOSIS — I129 Hypertensive chronic kidney disease with stage 1 through stage 4 chronic kidney disease, or unspecified chronic kidney disease: Secondary | ICD-10-CM

## 2024-04-09 DIAGNOSIS — K8689 Other specified diseases of pancreas: Secondary | ICD-10-CM

## 2024-04-09 DIAGNOSIS — M792 Neuralgia and neuritis, unspecified: Secondary | ICD-10-CM

## 2024-04-09 DIAGNOSIS — E441 Mild protein-calorie malnutrition: Secondary | ICD-10-CM

## 2024-04-09 MED ORDER — AMLODIPINE BESYLATE 10 MG PO TABS
10.0000 mg | ORAL_TABLET | Freq: Every day | ORAL | 1 refills | Status: DC
Start: 2024-04-09 — End: 2024-09-06

## 2024-04-09 MED ORDER — CYANOCOBALAMIN 1000 MCG/ML IJ SOLN
1000.0000 ug | Freq: Once | INTRAMUSCULAR | Status: AC
  Administered 2024-04-09: 1000 ug via INTRAMUSCULAR

## 2024-04-09 MED ORDER — LEVOTHYROXINE SODIUM 88 MCG PO TABS
88.0000 ug | ORAL_TABLET | Freq: Every day | ORAL | 1 refills | Status: DC
Start: 2024-04-09 — End: 2024-05-28

## 2024-04-09 MED ORDER — LISINOPRIL 20 MG PO TABS
20.0000 mg | ORAL_TABLET | Freq: Every day | ORAL | 1 refills | Status: DC
Start: 2024-04-09 — End: 2024-09-06

## 2024-04-09 MED ORDER — POLYETHYLENE GLYCOL 3350 17 GM/SCOOP PO POWD
17.0000 g | Freq: Every day | ORAL | 1 refills | Status: AC
Start: 2024-04-09 — End: ?

## 2024-04-09 MED ORDER — OLANZAPINE 2.5 MG PO TABS: 2.5000 mg | ORAL_TABLET | ORAL | 0 refills | Status: AC

## 2024-04-09 NOTE — Progress Notes (Signed)
 Name: Lori Pacheco   MRN: [id number]    DOB: 09-16-1921   Date:04/09/2024       Progress Note  Subjective  Chief Complaint  Chief Complaint  Patient presents with   Medical Management of Chronic Issues   HPI    Discussed the use of AI scribe software for clinical note transcription with the patient, who gave verbal consent to proceed.  History of Present Illness Lori Pacheco is a 89 year old female with moderate vascular dementia who presents for a six-month follow-up due to ongoing agitation and sundowning symptoms. She is accompanied by her caregiver.  She experiences ongoing agitation and sundowning symptoms, which have been worsening. Agitation increases as the day progresses and now occurs throughout the day. She takes olanzapine  5 mg before bed, but it has not been effective in alleviating her symptoms. The medication does not induce sleepiness, and her caregiver notes no sedation from it.  She has a history of pancytopenia with previous low hemoglobin and low platelet counts, although her platelet count has normalized. She also has a history of B12 deficiency and has not been taking otc supplements   She has chronic kidney disease stage 3B and secondary renal hyperparathyroidism. She also has benign hypertension, for which she takes amlodipine  10 mg and lisinopril  20 mg. Her blood pressure tends to be elevated during medical visits but is reportedly stable at home. She experiences occasional ankle swelling, but no shortness of breath is noted. Her kidney function has declined, with a GFR of 30.  She is experiencing myoprotein calorie malnutrition, with a weight loss of more than 10% over the past year. Her current BMI is 20.62. Despite taking Zyprexa , which is known to increase appetite, she continues to lose weight.  She has a history of atherosclerosis of the aorta and is not currently on cholesterol medication. Her thyroid  function was normal in December, and she takes  levothyroxine  88 mcg daily. She also has a pancreatic mass but no further studies per family request  She experiences constipation and uses Miralax  daily in her coffee to control consipation .    Patient Active Problem List   Diagnosis Date Noted   Mild vascular dementia with anxiety (HCC) 11/11/2022   Pancreatic mass 08/10/2022   History of recent fall 05/10/2022   Gastroesophageal reflux disease without esophagitis 05/10/2022   Chronic pain syndrome 05/09/2022   Palliative care patient 05/09/2022   Neuropathic pain of flank, right 05/09/2022   Benign hypertension with chronic kidney disease, stage III (HCC) 05/09/2022   Mild major depression (HCC) 05/09/2022   Stage 3a chronic kidney disease (HCC) 05/09/2022   Pancytopenia (HCC) 12/15/2020   Superior mesenteric artery stenosis (HCC) 03/02/2020   Stage 3b chronic kidney disease (HCC) 03/02/2020   History of pulmonary embolism 02/07/2020   Chronic abdominal pain 02/07/2020   Depression with anxiety 02/07/2020   Cognitive dysfunction 12/25/2018   Varicose veins of leg with pain, bilateral 12/04/2018   History of DVT (deep vein thrombosis) 10/17/2018   Other constipation 04/23/2018   Leg cramps 04/23/2018   Thrombocytopenia (HCC) 05/15/2017   Secondary renal hyperparathyroidism (HCC) 05/15/2017   Atherosclerosis of abdominal aorta (HCC) 11/10/2016   Perennial allergic rhinitis with seasonal variation 11/10/2016   Vitamin D  deficiency 03/29/2016   Hypothyroidism 09/18/2015   Essential hypertension 05/19/2015   Hyperlipemia 05/19/2015    Past Surgical History:  Procedure Laterality Date   NO PAST SURGERIES      Family History  Problem Relation Age  of Onset   Healthy Mother    Healthy Father    Heart disease Brother    Cancer Brother    Cancer Daughter    Alcohol abuse Son    Diabetes Son    Heart disease Son     Social History   Tobacco Use   Smoking status: Never   Smokeless tobacco: Former    Types: Snuff     Quit date: 09/27/1986   Tobacco comments:    smoking cessation materials not required  Substance Use Topics   Alcohol use: No    Alcohol/week: 0.0 standard drinks of alcohol     Current Outpatient Medications:    acetaminophen  (TYLENOL ) 500 MG tablet, Take 1 tablet (500 mg total) by mouth every 6 (six) hours as needed., Disp: 100 tablet, Rfl: 0   Blood Glucose-BP Monitor (BLOOD GLUCOSE-WRIST BP MONITOR) DEVI, 1 each by Does not apply route daily., Disp: 1 each, Rfl: 0   lidocaine  (LIDODERM ) 5 %, Place 1 patch onto the skin every 12 (twelve) hours. (remove after 12 hours), Disp: 90 patch, Rfl: 1   amLODipine  (NORVASC ) 10 MG tablet, Take 1 tablet (10 mg total) by mouth daily., Disp: 90 tablet, Rfl: 1   levothyroxine  (SYNTHROID ) 88 MCG tablet, Take 1 tablet (88 mcg total) by mouth daily before breakfast. And extra half on Sundays, Disp: 100 tablet, Rfl: 1   lisinopril  (ZESTRIL ) 20 MG tablet, Take 1 tablet (20 mg total) by mouth daily., Disp: 90 tablet, Rfl: 1   OLANZapine  (ZYPREXA ) 2.5 MG tablet, Take 1-2 tablets (2.5-5 mg total) by mouth as directed. 1 in am and 2 in pm, Disp: 90 tablet, Rfl: 0   polyethylene glycol powder (GLYCOLAX /MIRALAX ) 17 GM/SCOOP powder, Take 17 g by mouth daily with breakfast., Disp: 3350 g, Rfl: 1  Current Facility-Administered Medications:    cyanocobalamin  (VITAMIN B12) injection 1,000 mcg, 1,000 mcg, Intramuscular, Once,    cyanocobalamin  (VITAMIN B12) injection 1,000 mcg, 1,000 mcg, Intramuscular, Once,   Allergies  Allergen Reactions   Lyrica  [Pregabalin ]      I felt groggy    I personally reviewed active problem list, medication list, allergies, family history with the patient/caregiver today.   ROS  Ten systems reviewed and is negative except as mentioned in HPI    Objective Physical Exam Constitutional: Patient appears well-developed and malnourished with temporal waisting, palpable ribs No distress.  HEENT: head atraumatic, normocephalic,  pupils equal and reactive to light, neck supple Cardiovascular: Normal rate, regular rhythm and normal heart sounds.  No murmur heard. 2 plus pitting edema  on ankles   Pulmonary/Chest: Effort normal and breath sounds normal. No respiratory distress. Abdominal: Soft.  There is no tenderness. Psychiatric: Patient has a normal mood , cooperative     Vitals:   04/09/24 1330 04/09/24 1351  BP: (!) 162/84 (!) 154/72  Pulse: 64   Resp: 16   SpO2: 97%   Weight: 116 lb 6.4 oz (52.8 kg)   Height: 5' 3 (1.6 m)     Body mass index is 20.62 kg/m.   PHQ2/9:    04/09/2024    1:22 PM 01/15/2024    3:40 PM 12/06/2023    9:05 AM 11/17/2023   10:40 AM 09/21/2023    3:11 PM  Depression screen PHQ 2/9  Decreased Interest 0 0 0 0 0  Down, Depressed, Hopeless 0 0 0 0 0  PHQ - 2 Score 0 0 0 0 0  Altered sleeping  0 0 0  Tired, decreased energy  0 0 0   Change in appetite  0 0 0   Feeling bad or failure about yourself   0 0 0   Trouble concentrating  0 0 0   Moving slowly or fidgety/restless  0 0 0   Suicidal thoughts  0 0 0   PHQ-9 Score  0 0 0   Difficult doing work/chores  Not difficult at all Not difficult at all Not difficult at all     phq 9 is negative  Fall Risk:    04/09/2024    1:22 PM 11/17/2023   10:35 AM 09/21/2023    3:10 PM 05/25/2023    2:42 PM 04/14/2023    2:23 PM  Fall Risk   Falls in the past year? 1 1 0 1 1  Number falls in past yr: 0 0 0 1 1  Injury with Fall? 0 0 0 0 1  Risk for fall due to : Impaired balance/gait Impaired balance/gait Impaired balance/gait;Impaired mobility Impaired balance/gait Impaired mobility;Impaired balance/gait  Follow up Falls prevention discussed;Education provided;Falls evaluation completed Falls prevention discussed;Education provided;Falls evaluation completed Falls prevention discussed Education provided;Falls prevention discussed Falls prevention discussed      Assessment & Plan Moderate vascular dementia with agitation Agitation  persists despite olanzapine  5 mg at bedtime. Current regimen ineffective. - Adjust olanzapine  to 2.5 mg morning and 5 mg night, or 2.5 mg morning, 2.5 mg afternoon, 2.5 mg bedtime. -  assess new dosing effectiveness.  Chronic kidney disease stage 3B with renal hyperparathyroidism and hypertension CKD progressed to stage 3B, GFR 30. Occasional ankle swelling likely due to kidney function and hypertension. BP elevated in office, controlled at home. Prefers conservative management, no dialysis. - Monitor kidney function and blood pressure. - Consider diuretic if swelling persists. - Continue amlodipine  10 mg and lisinopril  20 mg.  Protein-calorie malnutrition Temporal wasting and low appetite possibly related to dementia and medication. - Continue olanzapine  to potentially improve appetite. - Encourage small, high-calorie meals and snacks, including protein shakes, fruit, cookies, and desserts. - Avoid salt due to hypertension and kidney disease.  B12 deficiency History of B12 deficiency, and not compliant with B12 orally  Injection may improve levels and reduce agitation. - Administer B12 injection monthly. - Perform blood work to assess B12 levels before injection.  Pancytopenia Pancytopenia with low WBC, low hemoglobin, previously low platelets now normalized. Further evaluation needed. - Order CBC to evaluate current blood counts.  Atherosclerosis of the aorta Atherosclerosis present. No active treatment due to age and health status. - Continue monitoring without specific treatment.  Pancreatic mass Pancreatic mass managed conservatively due to age and health status. No surgical intervention planned. - Continue conservative management.

## 2024-04-10 LAB — CBC WITH DIFFERENTIAL/PLATELET
Absolute Lymphocytes: 1265 {cells}/uL (ref 850–3900)
Absolute Monocytes: 183 {cells}/uL — ABNORMAL LOW (ref 200–950)
Basophils Absolute: 31 {cells}/uL (ref 0–200)
Basophils Relative: 1 %
Eosinophils Absolute: 31 {cells}/uL (ref 15–500)
Eosinophils Relative: 1 %
HCT: 36.7 % (ref 35.0–45.0)
Hemoglobin: 11.6 g/dL — ABNORMAL LOW (ref 11.7–15.5)
MCH: 29.4 pg (ref 27.0–33.0)
MCHC: 31.6 g/dL — ABNORMAL LOW (ref 32.0–36.0)
MCV: 92.9 fL (ref 80.0–100.0)
MPV: 10.5 fL (ref 7.5–12.5)
Monocytes Relative: 5.9 %
Neutro Abs: 1590 {cells}/uL (ref 1500–7800)
Neutrophils Relative %: 51.3 %
Platelets: 159 10*3/uL (ref 140–400)
RBC: 3.95 10*6/uL (ref 3.80–5.10)
RDW: 14 % (ref 11.0–15.0)
Total Lymphocyte: 40.8 %
WBC: 3.1 10*3/uL — ABNORMAL LOW (ref 3.8–10.8)

## 2024-04-10 LAB — COMPREHENSIVE METABOLIC PANEL WITH GFR
AG Ratio: 1.6 (calc) (ref 1.0–2.5)
ALT: 10 U/L (ref 6–29)
AST: 15 U/L (ref 10–35)
Albumin: 4.6 g/dL (ref 3.6–5.1)
Alkaline phosphatase (APISO): 71 U/L (ref 37–153)
BUN/Creatinine Ratio: 21 (calc) (ref 6–22)
BUN: 31 mg/dL — ABNORMAL HIGH (ref 7–25)
CO2: 28 mmol/L (ref 20–32)
Calcium: 9.6 mg/dL (ref 8.6–10.4)
Chloride: 104 mmol/L (ref 98–110)
Creat: 1.48 mg/dL — ABNORMAL HIGH (ref 0.60–0.95)
Globulin: 2.9 g/dL (ref 1.9–3.7)
Glucose, Bld: 91 mg/dL (ref 65–99)
Potassium: 4.1 mmol/L (ref 3.5–5.3)
Sodium: 141 mmol/L (ref 135–146)
Total Bilirubin: 0.5 mg/dL (ref 0.2–1.2)
Total Protein: 7.5 g/dL (ref 6.1–8.1)
eGFR: 31 mL/min/{1.73_m2} — ABNORMAL LOW (ref >=60)

## 2024-04-10 LAB — B12 AND FOLATE PANEL
Folate: 14.2 ng/mL
Vitamin B-12: 322 pg/mL (ref 200–1100)

## 2024-04-10 LAB — LIPID PANEL
Cholesterol: 355 mg/dL — ABNORMAL HIGH (ref <200)
HDL: 100 mg/dL (ref >=50)
LDL Cholesterol (Calc): 232 mg/dL — ABNORMAL HIGH
Non-HDL Cholesterol (Calc): 255 mg/dL — ABNORMAL HIGH (ref <130)
Total CHOL/HDL Ratio: 3.6 (calc) (ref <5.0)
Triglycerides: 98 mg/dL (ref <150)

## 2024-04-10 LAB — TSH: TSH: 102.94 m[IU]/L — ABNORMAL HIGH (ref 0.40–4.50)

## 2024-04-10 LAB — VITAMIN D 25 HYDROXY (VIT D DEFICIENCY, FRACTURES): Vit D, 25-Hydroxy: 41 ng/mL (ref 30–100)

## 2024-04-10 LAB — PARATHYROID HORMONE, INTACT (NO CA): PTH: 80 pg/mL — ABNORMAL HIGH (ref 16–77)

## 2024-04-11 ENCOUNTER — Ambulatory Visit: Payer: Self-pay | Admitting: Family Medicine

## 2024-04-12 ENCOUNTER — Other Ambulatory Visit: Payer: Self-pay

## 2024-04-12 DIAGNOSIS — E039 Hypothyroidism, unspecified: Secondary | ICD-10-CM

## 2024-04-22 ENCOUNTER — Telehealth: Payer: Self-pay | Admitting: Family Medicine

## 2024-04-22 NOTE — Telephone Encounter (Unsigned)
 Copied from CRM [id number]. Topic: Clinical - Medical Advice >> Apr 22, 2024  2:43 PM Tiffini S wrote: Reason for CRM: Patient daughter Arrie is asking for a call back for medical advice. Said her mother is up walking all night and sleeps about one hour. Asking for recommendation. Please call back at [phone].

## 2024-04-22 NOTE — Telephone Encounter (Signed)
**Note De-identified  [place] Obfuscation** Please advise 

## 2024-04-22 NOTE — Telephone Encounter (Signed)
 Will she need to come see you or neurologist?

## 2024-04-23 NOTE — Telephone Encounter (Signed)
 Daughter notified

## 2024-05-01 ENCOUNTER — Other Ambulatory Visit: Payer: Self-pay | Admitting: Family Medicine

## 2024-05-01 DIAGNOSIS — F01B11 Vascular dementia, moderate, with agitation: Secondary | ICD-10-CM

## 2024-05-09 ENCOUNTER — Ambulatory Visit

## 2024-05-15 NOTE — Telephone Encounter (Unsigned)
 Copied from CRM [id number]. Topic: Appointments - Scheduling Inquiry for Clinic >> May 15, 2024  1:49 PM Lori Pacheco wrote: Reason for CRM: Patient daughter Lori Pacheco) wants to know if Dr. Glenard can sign a form from [place]? The form is stating that the patient can no longer care for her personal affairs. Such as paying her bills. I did let Lori Pacheco know that she would need to make an appt in order for Dr. Glenard to sign off on the for. Lori Pacheco is requesting a callback [phone] or [phone]

## 2024-05-17 DIAGNOSIS — M1990 Unspecified osteoarthritis, unspecified site: Secondary | ICD-10-CM | POA: Diagnosis not present

## 2024-05-17 DIAGNOSIS — K219 Gastro-esophageal reflux disease without esophagitis: Secondary | ICD-10-CM | POA: Diagnosis not present

## 2024-05-17 DIAGNOSIS — D6869 Other thrombophilia: Secondary | ICD-10-CM | POA: Diagnosis not present

## 2024-05-17 DIAGNOSIS — G309 Alzheimer's disease, unspecified: Secondary | ICD-10-CM | POA: Diagnosis not present

## 2024-05-17 DIAGNOSIS — I4891 Unspecified atrial fibrillation: Secondary | ICD-10-CM | POA: Diagnosis not present

## 2024-05-17 DIAGNOSIS — N1832 Chronic kidney disease, stage 3b: Secondary | ICD-10-CM | POA: Diagnosis not present

## 2024-05-17 DIAGNOSIS — E46 Unspecified protein-calorie malnutrition: Secondary | ICD-10-CM | POA: Diagnosis not present

## 2024-05-17 DIAGNOSIS — F325 Major depressive disorder, single episode, in full remission: Secondary | ICD-10-CM | POA: Diagnosis not present

## 2024-05-17 DIAGNOSIS — I700 Atherosclerosis of aorta: Secondary | ICD-10-CM | POA: Diagnosis not present

## 2024-05-27 DIAGNOSIS — E039 Hypothyroidism, unspecified: Secondary | ICD-10-CM | POA: Diagnosis not present

## 2024-05-27 DIAGNOSIS — F320 Major depressive disorder, single episode, mild: Secondary | ICD-10-CM | POA: Diagnosis not present

## 2024-05-27 DIAGNOSIS — Z1339 Encounter for screening examination for other mental health and behavioral disorders: Secondary | ICD-10-CM | POA: Diagnosis not present

## 2024-05-27 DIAGNOSIS — Z0000 Encounter for general adult medical examination without abnormal findings: Secondary | ICD-10-CM | POA: Diagnosis not present

## 2024-05-27 DIAGNOSIS — Z1383 Encounter for screening for respiratory disorder NEC: Secondary | ICD-10-CM | POA: Diagnosis not present

## 2024-05-27 DIAGNOSIS — Z789 Other specified health status: Secondary | ICD-10-CM | POA: Diagnosis not present

## 2024-05-27 DIAGNOSIS — F01A4 Vascular dementia, mild, with anxiety: Secondary | ICD-10-CM | POA: Diagnosis not present

## 2024-05-28 ENCOUNTER — Emergency Department

## 2024-05-28 ENCOUNTER — Other Ambulatory Visit: Payer: Self-pay

## 2024-05-28 ENCOUNTER — Observation Stay
Admission: EM | Admit: 2024-05-28 | Discharge: 2024-05-30 | Disposition: A | Discharge status: Home Health | Attending: Student in an Organized Health Care Education/Training Program | Admitting: Student in an Organized Health Care Education/Training Program

## 2024-05-28 DIAGNOSIS — E039 Hypothyroidism, unspecified: Secondary | ICD-10-CM | POA: Diagnosis not present

## 2024-05-28 DIAGNOSIS — R4182 Altered mental status, unspecified: Principal | ICD-10-CM | POA: Diagnosis present

## 2024-05-28 DIAGNOSIS — F05 Delirium due to known physiological condition: Secondary | ICD-10-CM | POA: Diagnosis not present

## 2024-05-28 DIAGNOSIS — Z79890 Hormone replacement therapy (postmenopausal): Secondary | ICD-10-CM | POA: Insufficient documentation

## 2024-05-28 DIAGNOSIS — R400 Somnolence: Secondary | ICD-10-CM | POA: Diagnosis not present

## 2024-05-28 DIAGNOSIS — R410 Disorientation, unspecified: Secondary | ICD-10-CM | POA: Diagnosis not present

## 2024-05-28 DIAGNOSIS — D72819 Decreased white blood cell count, unspecified: Secondary | ICD-10-CM | POA: Diagnosis not present

## 2024-05-28 DIAGNOSIS — N1832 Chronic kidney disease, stage 3b: Secondary | ICD-10-CM | POA: Diagnosis not present

## 2024-05-28 DIAGNOSIS — I129 Hypertensive chronic kidney disease with stage 1 through stage 4 chronic kidney disease, or unspecified chronic kidney disease: Secondary | ICD-10-CM | POA: Insufficient documentation

## 2024-05-28 DIAGNOSIS — D696 Thrombocytopenia, unspecified: Secondary | ICD-10-CM | POA: Diagnosis not present

## 2024-05-28 DIAGNOSIS — F0390 Unspecified dementia without behavioral disturbance: Secondary | ICD-10-CM | POA: Insufficient documentation

## 2024-05-28 DIAGNOSIS — Z79899 Other long term (current) drug therapy: Secondary | ICD-10-CM | POA: Diagnosis not present

## 2024-05-28 DIAGNOSIS — I10 Essential (primary) hypertension: Secondary | ICD-10-CM | POA: Diagnosis present

## 2024-05-28 DIAGNOSIS — G894 Chronic pain syndrome: Secondary | ICD-10-CM | POA: Diagnosis not present

## 2024-05-28 DIAGNOSIS — Z86718 Personal history of other venous thrombosis and embolism: Secondary | ICD-10-CM | POA: Diagnosis not present

## 2024-05-28 DIAGNOSIS — I6523 Occlusion and stenosis of bilateral carotid arteries: Secondary | ICD-10-CM | POA: Diagnosis not present

## 2024-05-28 DIAGNOSIS — R5381 Other malaise: Secondary | ICD-10-CM | POA: Insufficient documentation

## 2024-05-28 DIAGNOSIS — R918 Other nonspecific abnormal finding of lung field: Secondary | ICD-10-CM | POA: Diagnosis not present

## 2024-05-28 LAB — CBC WITH DIFFERENTIAL/PLATELET
Abs Granulocyte: 2.1 K/uL (ref 1.5–6.5)
Abs Immature Granulocytes: 0.03 K/uL (ref 0.00–0.07)
Basophils Absolute: 0 K/uL (ref 0.0–0.1)
Basophils Relative: 1 %
Eosinophils Absolute: 0 K/uL (ref 0.0–0.5)
Eosinophils Relative: 1 %
HCT: 34.5 % — ABNORMAL LOW (ref 36.0–46.0)
Hemoglobin: 11.1 g/dL — ABNORMAL LOW (ref 12.0–15.0)
Immature Granulocytes: 1 %
Lymphocytes Relative: 18 %
Lymphs Abs: 0.5 K/uL — ABNORMAL LOW (ref 0.7–4.0)
MCH: 29.6 pg (ref 26.0–34.0)
MCHC: 32.2 g/dL (ref 30.0–36.0)
MCV: 92 fL (ref 80.0–100.0)
Monocytes Absolute: 0.2 K/uL (ref 0.1–1.0)
Monocytes Relative: 7 %
Neutro Abs: 2.1 K/uL (ref 1.7–7.7)
Neutrophils Relative %: 72 %
Platelets: 141 K/uL — ABNORMAL LOW (ref 150–400)
RBC: 3.75 MIL/uL — ABNORMAL LOW (ref 3.87–5.11)
RDW: 13.8 % (ref 11.5–15.5)
WBC: 2.9 K/uL — ABNORMAL LOW (ref 4.0–10.5)
nRBC: 0 % (ref 0.0–0.2)

## 2024-05-28 LAB — URINALYSIS, W/ REFLEX TO CULTURE (INFECTION SUSPECTED)
Bilirubin Urine: NEGATIVE
Glucose, UA: NEGATIVE mg/dL
Ketones, ur: NEGATIVE mg/dL
Leukocytes,Ua: NEGATIVE
Nitrite: NEGATIVE
Protein, ur: 30 mg/dL — AB
Specific Gravity, Urine: 1.008 (ref 1.005–1.030)
pH: 8 (ref 5.0–8.0)

## 2024-05-28 LAB — LACTIC ACID, PLASMA
Lactic Acid, Venous: 1.1 mmol/L (ref 0.5–1.9)
Lactic Acid, Venous: 1.5 mmol/L (ref 0.5–1.9)

## 2024-05-28 LAB — COMPREHENSIVE METABOLIC PANEL WITH GFR
ALT: 13 U/L (ref 0–44)
AST: 25 U/L (ref 15–41)
Albumin: 3.7 g/dL (ref 3.5–5.0)
Alkaline Phosphatase: 76 U/L (ref 38–126)
Anion gap: 9 (ref 5–15)
BUN: 21 mg/dL (ref 8–23)
CO2: 24 mmol/L (ref 22–32)
Calcium: 9.1 mg/dL (ref 8.9–10.3)
Chloride: 105 mmol/L (ref 98–111)
Creatinine, Ser: 1.37 mg/dL — ABNORMAL HIGH (ref 0.44–1.00)
GFR, Estimated: 34 mL/min — ABNORMAL LOW (ref >60)
Glucose, Bld: 150 mg/dL — ABNORMAL HIGH (ref 70–99)
Potassium: 4.4 mmol/L (ref 3.5–5.1)
Sodium: 138 mmol/L (ref 135–145)
Total Bilirubin: 1 mg/dL (ref 0.0–1.2)
Total Protein: 6.8 g/dL (ref 6.5–8.1)

## 2024-05-28 LAB — TROPONIN I (HIGH SENSITIVITY)
Troponin I (High Sensitivity): 8 ng/L (ref <18)
Troponin I (High Sensitivity): 10 ng/L (ref <18)

## 2024-05-28 LAB — PROTIME-INR
INR: 1 (ref 0.8–1.2)
Prothrombin Time: 13.6 s (ref 11.4–15.2)

## 2024-05-28 MED ORDER — ENOXAPARIN SODIUM 30 MG/0.3ML IJ SOSY
30.0000 mg | PREFILLED_SYRINGE | INTRAMUSCULAR | Status: DC
  Administered 2024-05-28 – 2024-05-29 (×4): 30 mg via SUBCUTANEOUS
  Filled 2024-05-28 (×2): qty 0.3

## 2024-05-28 MED ORDER — ACETAMINOPHEN 500 MG PO TABS: 500.0000 mg | ORAL_TABLET | Freq: Four times a day (QID) | ORAL | Status: DC | PRN

## 2024-05-28 MED ORDER — QUETIAPINE FUMARATE 25 MG PO TABS
12.5000 mg | ORAL_TABLET | Freq: Every day | ORAL | Status: DC
  Administered 2024-05-28 (×2): 12.5 mg via ORAL
  Filled 2024-05-28: qty 1

## 2024-05-28 MED ORDER — LEVOTHYROXINE SODIUM 100 MCG PO TABS
100.0000 ug | ORAL_TABLET | Freq: Every day | ORAL | Status: DC
  Administered 2024-05-29 – 2024-05-30 (×3): 100 ug via ORAL
  Filled 2024-05-28 (×2): qty 1

## 2024-05-28 MED ORDER — LISINOPRIL 20 MG PO TABS
20.0000 mg | ORAL_TABLET | Freq: Every day | ORAL | Status: DC
  Administered 2024-05-28 – 2024-05-30 (×5): 20 mg via ORAL
  Filled 2024-05-28 (×3): qty 1

## 2024-05-28 MED ORDER — SODIUM CHLORIDE 0.9 % IV BOLUS
500.0000 mL | Freq: Once | INTRAVENOUS | Status: AC
  Administered 2024-05-28 (×2): 500 mL via INTRAVENOUS

## 2024-05-28 MED ORDER — OLANZAPINE 5 MG PO TABS
5.0000 mg | ORAL_TABLET | Freq: Every day | ORAL | Status: DC
  Administered 2024-05-28 (×2): 5 mg via ORAL
  Filled 2024-05-28 (×2): qty 1

## 2024-05-28 MED ORDER — AMLODIPINE BESYLATE 10 MG PO TABS
10.0000 mg | ORAL_TABLET | Freq: Every day | ORAL | Status: DC
  Administered 2024-05-28 – 2024-05-30 (×5): 10 mg via ORAL
  Filled 2024-05-28 (×3): qty 1

## 2024-05-28 MED ORDER — OLANZAPINE 2.5 MG PO TABS: 2.5000 mg | ORAL_TABLET | ORAL | Status: DC

## 2024-05-28 MED ORDER — OLANZAPINE 2.5 MG PO TABS
2.5000 mg | ORAL_TABLET | Freq: Every day | ORAL | Status: DC
  Administered 2024-05-29 (×2): 2.5 mg via ORAL
  Filled 2024-05-28: qty 1

## 2024-05-28 NOTE — H&P (Signed)
 History and Physical    Patient: Lori Pacheco FMW:[id number] DOB: 21-Dec-1920 DOA: 05/28/2024 DOS: the patient was seen and examined on 05/28/2024 PCP: Glenard Mire, MD  Patient coming from: Home  Chief Complaint:  Chief Complaint  Patient presents with   Altered Mental Status   HPI: Lori Pacheco is a 89 y.o. female with medical history significant of essential hypertension and dementia who presents to the hospital with altered mental status. Patient went to the family doctor's office yesterday, was started on donepezil, she only took 1 dose, that she started have confusion.  She was also nauseated, vomited 1 time.  She has not been eating today. At baseline, she walks with walker, she has some baseline confusion, does not know the date.  Upon arrival in the hospital, she has a mild leukopenia and thrombocytopenia.  Creatinine 1.37, appeared to baseline.  Patient received a 500 mL of fluid bolus.  Requested admit to the hospital for observation overnight. Review of Systems: As mentioned in the history of present illness. All other systems reviewed and are negative. Past Medical History:  Diagnosis Date   Hyperlipidemia    Hypertension    Thyroid  disease    Past Surgical History:  Procedure Laterality Date   NO PAST SURGERIES     Social History:  reports that she has never smoked. She quit smokeless tobacco use about 37 years ago.  Her smokeless tobacco use included snuff. She reports that she does not drink alcohol and does not use drugs.  Allergies  Allergen Reactions   Lyrica  [Pregabalin ]      I felt groggy    Family History  Problem Relation Age of Onset   Healthy Mother    Healthy Father    Heart disease Brother    Cancer Brother    Cancer Daughter    Alcohol abuse Son    Diabetes Son    Heart disease Son     Prior to Admission medications   Medication Sig Start Date End Date Taking? Authorizing Provider  acetaminophen  (TYLENOL ) 500 MG tablet Take 1  tablet (500 mg total) by mouth every 6 (six) hours as needed. 07/24/17   Sowles, Krichna, MD  amLODipine  (NORVASC ) 10 MG tablet Take 1 tablet (10 mg total) by mouth daily. 04/09/24   Sowles, Krichna, MD  Blood Glucose-BP Monitor (BLOOD GLUCOSE-WRIST BP MONITOR) DEVI 1 each by Does not apply route daily. 04/15/19   Sowles, Krichna, MD  levothyroxine  (SYNTHROID ) 88 MCG tablet Take 1 tablet (88 mcg total) by mouth daily before breakfast. And extra half on Sundays 04/09/24   Sowles, Krichna, MD  lidocaine  (LIDODERM ) 5 % Place 1 patch onto the skin every 12 (twelve) hours. (remove after 12 hours) 11/20/23   Sowles, Krichna, MD  lisinopril  (ZESTRIL ) 20 MG tablet Take 1 tablet (20 mg total) by mouth daily. 04/09/24   Sowles, Krichna, MD  OLANZapine  (ZYPREXA ) 2.5 MG tablet Take 1-2 tablets (2.5-5 mg total) by mouth as directed. 1 in am and 2 in pm 04/09/24   Sowles, Krichna, MD  polyethylene glycol powder (GLYCOLAX /MIRALAX ) 17 GM/SCOOP powder Take 17 g by mouth daily with breakfast. 04/09/24   Glenard Mire, MD    Physical Exam: Vitals:   05/28/24 1111 05/28/24 1117 05/28/24 1200  BP:  (!) 164/62 (!) 165/63  Pulse:  61 66  Resp:  15 19  Temp:  (!) 96.7 F (35.9 C)   TempSrc:  Oral   SpO2:  99% 99%  Weight: 51.7 kg  Height: 5' 3 (1.6 m)     Physical Exam Constitutional:      General: She is not in acute distress.    Appearance: She is not toxic-appearing.     Comments: Sleepy,  HENT:     Head: Normocephalic and atraumatic.     Nose: Nose normal. No congestion or rhinorrhea.     Mouth/Throat:     Mouth: Mucous membranes are moist.     Pharynx: Oropharynx is clear.  Eyes:     Conjunctiva/sclera: Conjunctivae normal.     Pupils: Pupils are equal, round, and reactive to light.  Cardiovascular:     Rate and Rhythm: Normal rate and regular rhythm.     Heart sounds: No murmur heard.    No gallop.  Pulmonary:     Effort: Pulmonary effort is normal. No respiratory distress.     Breath sounds:  Normal breath sounds. No wheezing.  Abdominal:     General: Abdomen is flat. Bowel sounds are normal. There is no distension.     Tenderness: There is no abdominal tenderness.  Musculoskeletal:        General: No swelling or tenderness.     Cervical back: Normal range of motion and neck supple. No rigidity.  Lymphadenopathy:     Cervical: No cervical adenopathy.  Skin:    General: Skin is warm and dry.     Coloration: Skin is not jaundiced.  Neurological:     General: No focal deficit present.     Mental Status: She is disoriented.     Cranial Nerves: No cranial nerve deficit.  Psychiatric:     Comments: Flat affect.     Data Reviewed:  CT head no acute changes. Chest x-ray no pneumonia. UA not consistent with UTI. WBC 2.9, platelets 141, creatinine 1.37.   Assessment and Plan: Altered mental status. Dementia with delirium. Patient appeared to have some baseline dementia, started have significant altered mental status after new medicine with donepezil.  Most likely this is medication effect.  Patient has received a bolus of fluids, no additional fluids needed.  I will hold off additional donepezil.  Patient will be observed overnight.  Most likely will be discharged home tomorrow.  Due to high risk of delirium in the hospital, she will be given Seroquel  tonight for help with sleep.  Leukopenia. Mild thrombocytopenia. Not sure about etiology, will recheck a level tomorrow.  Chronic kidney disease stage IIIb. Renal function appears to be stable,  Essential hypertension. Continues on home medicines    Advance Care Planning:   Code Status: Full Code status with patient daughter, patient has not made a decision about CODE STATUS.  I advised her to talk with the family, she should be a DNR status.  For now, she is a full code.  Consults: None  Family Communication: Daughter updated at bedside.  Severity of Illness: The appropriate patient status for this patient is  OBSERVATION. Observation status is judged to be reasonable and necessary in order to provide the required intensity of service to ensure the patient's safety. The patient's presenting symptoms, physical exam findings, and initial radiographic and laboratory data in the context of their medical condition is felt to place them at decreased risk for further clinical deterioration. Furthermore, it is anticipated that the patient will be medically stable for discharge from the hospital within 2 midnights of admission.   Author: Murvin Mana, MD 05/28/2024 2:50 PM  For on call review [email or web address].

## 2024-05-28 NOTE — ED Notes (Signed)
 Pt ambulated to toilet with family and RN. Linens changed and new brief applied. Pt resting in bed with call light in reach and bed alarm on.

## 2024-05-28 NOTE — ED Provider Notes (Signed)
 CT Head Wo Contrast Result Date: 05/28/2024 CLINICAL DATA:  Delirium EXAM: CT HEAD WITHOUT CONTRAST TECHNIQUE: Contiguous axial images were obtained from the base of the skull through the vertex without intravenous contrast. RADIATION DOSE REDUCTION: This exam was performed according to the departmental dose-optimization program which includes automated exposure control, adjustment of the mA and/or kV according to patient size and/or use of iterative reconstruction technique. COMPARISON:  CT head 03/23/2022 FINDINGS: Brain: Cerebral ventricle sizes are concordant with the degree of cerebral volume loss. Patchy and confluent areas of decreased attenuation are noted throughout the deep and periventricular white matter of the cerebral hemispheres bilaterally, compatible with chronic microvascular ischemic disease. No evidence of large-territorial acute infarction. No parenchymal hemorrhage. No mass lesion. No extra-axial collection. No mass effect or midline shift. No hydrocephalus. Basilar cisterns are patent. Vascular: No hyperdense vessel. Atherosclerotic calcifications are present within the cavernous internal carotid arteries. Skull: No acute fracture or focal lesion. Left greater than right temporomandibular joint degenerative changes. Sinuses/Orbits: Paranasal sinuses and mastoid air cells are clear. The orbits are unremarkable. Other: None. IMPRESSION: No acute intracranial abnormality. Electronically Signed   By: Morgane  Naveau M.D.   On: 05/28/2024 14:53    Dicky Anes, MD 05/28/24 1551

## 2024-05-28 NOTE — ED Notes (Signed)
 CCMD called at this time.

## 2024-05-28 NOTE — ED Provider Notes (Signed)
 [facility] Provider Note    Event Date/Time   First MD Initiated Contact with Patient 05/28/24 1109     (approximate)   History   Altered Mental Status   HPI  Lori Pacheco is a 89 y.o. female who according to PCP note by Dr. Glenard has a history of vascular dementia as well as a history of pancytopenia and anemia also chronic kidney disease hyperparathyroidism and hypertension as well as aortic atherosclerosis.  Additional history reports of previous pancreatic mass, chronic pain syndrome, palliative care as well as history of DVT.  Current medications do not appear to include an anticoagulant  Patient's daughter who is at the bedside reports that she has had a hard time waking up this morning.  At her sitting up at the side of the bed and were giving her a couple coffee but she was too weak to even drink it and laid back down into the bed.  She has been difficult to wake up.   She saw Dr. At [facility] well yesterday.  They do report that her blood pressure got checked everything was okay but she did get a new prescription that she used last night for new sleep medicine  Last night she was last normal about 8 PM.  They put her to bed at that time.  She is normally up walking conversational, this morning she was not able to be easily woken and only briefly would wake up.  Family did not observe any sort of facial droop they did not observe any weakness on any 1 side.  The patient did not complain of any pain.  They have not noticed any fevers or recent illness and reported doctor visit yesterday everything checked out well except she does suffer some restlessness at night, so they prescribed her new sleep medicine.  She is not known to have awoken at all last night  EMS reports vital signs appropriate.  Blood glucose was normal     Physical Exam   Triage Vital Signs: ED Triage Vitals  Encounter Vitals Group     BP      Girls Systolic BP Percentile       Girls Diastolic BP Percentile      Boys Systolic BP Percentile      Boys Diastolic BP Percentile      Pulse      Resp      Temp      Temp src      SpO2      Weight      Height      Head Circumference      Peak Flow      Pain Score      Pain Loc      Pain Education      Exclude from Growth Chart     Most recent vital signs: Vitals:   05/28/24 1117 05/28/24 1200  BP: (!) 164/62 (!) 165/63  Pulse: 61 66  Resp: 15 19  Temp: (!) 96.7 F (35.9 C)   SpO2: 99% 99%     General: Somnolent.  Somewhat difficult to arouse but to touch and stimulation will open her eyes look around for a few seconds, says her name and then seems to fall fast back asleep.  She is very much in no distress though, resting flaccidly.  Awakens briefly when stimulated but then quickly goes back to resting.  Difficult to assess for focal symptoms as she has a hard time  maintaining alertness for more than a second or 2. CV:  Good peripheral perfusion.  Normal tones and rate Resp:  Normal effort.  Clear lungs with even unlabored respirations.  Managing secretions without difficulty Abd:  No distention.  Soft nontender nondistended Other:  Able to demonstrate grip strength equal bilaterally and wiggles toes equally bilaterally but participation in detailed neurologic Sam is difficult.  No obvious facial droop.  Edentulous.  Mucous membranes appear normal.  Extraocular movements are conjugate and normal when she is awake for a second or 2 but then she falls fastly back to somnolence or sleep   ED Results / Procedures / Treatments   Labs (all labs ordered are listed, but only abnormal results are displayed) Labs Reviewed  COMPREHENSIVE METABOLIC PANEL WITH GFR - Abnormal; Notable for the following components:      Result Value   Glucose, Bld 150 (*)    Creatinine, Ser 1.37 (*)    GFR, Estimated 34 (*)    All other components within normal limits  CBC WITH DIFFERENTIAL/PLATELET - Abnormal; Notable for the  following components:   WBC 2.9 (*)    RBC 3.75 (*)    Hemoglobin 11.1 (*)    HCT 34.5 (*)    Platelets 141 (*)    Lymphs Abs 0.5 (*)    All other components within normal limits  URINALYSIS, W/ REFLEX TO CULTURE (INFECTION SUSPECTED) - Abnormal; Notable for the following components:   Color, Urine STRAW (*)    APPearance CLEAR (*)    Hgb urine dipstick SMALL (*)    Protein, ur 30 (*)    Bacteria, UA RARE (*)    All other components within normal limits  CULTURE, BLOOD (ROUTINE X 2)  CULTURE, BLOOD (ROUTINE X 2)  URINE CULTURE  LACTIC ACID, PLASMA  PROTIME-INR  LACTIC ACID, PLASMA  CBG MONITORING, ED  TROPONIN I (HIGH SENSITIVITY)  TROPONIN I (HIGH SENSITIVITY)     EKG  And interpreted by me at 1115 heart rate 60 QRS 120 QTc 460 Sinus rhythm, left bundle branch block type morphology.  No evidence of acute ischemia denoted   RADIOLOGY  DG Chest Port 1 View Result Date: 05/28/2024 CLINICAL DATA:  Altered mental status. EXAM: PORTABLE CHEST 1 VIEW COMPARISON:  November 10, 2023. FINDINGS: Stable cardiomediastinal silhouette. Right lung is clear. Minimal left basilar subsegmental atelectasis or scarring is noted. Bony thorax is unremarkable. IMPRESSION: Minimal left basilar subsegmental atelectasis or scarring. Electronically Signed   By: Lynwood Landy Raddle M.D.   On: 05/28/2024 12:14    Chest x-ray interpreted as negative for acute finding by me.  CT head read is pending, but grossly I do not see evidence of acute abnormality on my inspection but await final read.  Hospitalist to follow-up   PROCEDURES:  Critical Care performed: No  Procedures   MEDICATIONS ORDERED IN ED: Medications  sodium chloride  0.9 % bolus 500 mL (500 mLs Intravenous New Bag/Given 05/28/24 1146)     IMPRESSION / MDM / ASSESSMENT AND PLAN / ED COURSE  I reviewed the triage vital signs and the nursing notes.                              Differential diagnosis includes, but is not limited to,  possible metabolic encephalopathic, infection cardiac central neurologic though no obvious focal findings the limitations in exam, medication side effect especially strongly considered in light of the use of a  new sleep medicine which family is currently working to obtain the name of last night for the first time.  She is resting plastically does not appear acutely toxic but mental status is somnolent.  Further workup is pending.  Patient's presentation is most consistent with acute complicated illness / injury requiring diagnostic workup.   The patient is on the cardiac monitor to evaluate for evidence of arrhythmia and/or significant heart rate changes.  Labs demonstrate chronic leukopenia and very mild thrombocytopenia.   Clinical Course as of 05/28/24 1445  Tue May 28, 2024  1140 Family able to identify that new prescription started last night was donepezil 5 mg [MQ]  1442 Hospitalist Dr. Laurita at bedside for admission.  To this juncture no acute causation for altered mental status to noted except for a clinical history highly suggestive at this point that this could be a side effect of her newly initiated donepezil.  Counseled daughter at the bedside to discontinue this medication, we will continue to observe her.  The patient's mentation is improving she maintains alertness for at least more than a few seconds now, opens eyes, but is still somnolent.  Overall improving mental status slow to improve but showing improvement [MQ]  1443 CT head grossly inter by me is negative for acute intracranial pathology.  Final read pending. [MQ]    Clinical Course User Index [MQ] Dicky Anes, MD   Urinalysis with rare bacteria though no compelling evidence of UTI.  Sent for culture.  FINAL CLINICAL IMPRESSION(S) / ED DIAGNOSES   Final diagnoses:  Somnolence     Rx / DC Orders   ED Discharge Orders     None        Note:  This document was prepared using Dragon voice recognition software  and may include unintentional dictation errors.   Dicky Anes, MD 05/28/24 [phone]

## 2024-05-28 NOTE — ED Triage Notes (Addendum)
 Pt to ED [facility] coming from home. Daughter called out due to not being able to wake her mother up around 0930 this morning. When EMS arrived she was alert to self only. Pt is A&O x 4 and ambulatory at [facility]. Hx of dementia and HTN. Pt was seen yesterday at PCP for HTN. Family reports that Pt was prescribed 5mg  Donepezil yesterday for dementia.   97.9 151 CBG 166/88 60 HR 98% RA RR 14-16

## 2024-05-28 NOTE — ED Notes (Signed)
 Blood cultures and lactic sent to the lab

## 2024-05-28 NOTE — Progress Notes (Signed)
 Shift report:   Patient is very confused and anxious at end of shift. She has progressively started to sun down and has tried to get out of the bed multiple times. I definitely think she will need a sitter overnight. Zyprexa  given early per MD order.   Daughter at bedside.

## 2024-05-29 DIAGNOSIS — R400 Somnolence: Secondary | ICD-10-CM | POA: Diagnosis not present

## 2024-05-29 LAB — CBC
HCT: 35.3 % — ABNORMAL LOW (ref 36.0–46.0)
Hemoglobin: 11.4 g/dL — ABNORMAL LOW (ref 12.0–15.0)
MCH: 29.4 pg (ref 26.0–34.0)
MCHC: 32.3 g/dL (ref 30.0–36.0)
MCV: 91 fL (ref 80.0–100.0)
Platelets: 162 K/uL (ref 150–400)
RBC: 3.88 MIL/uL (ref 3.87–5.11)
RDW: 13.5 % (ref 11.5–15.5)
WBC: 3.1 K/uL — ABNORMAL LOW (ref 4.0–10.5)
nRBC: 0 % (ref 0.0–0.2)

## 2024-05-29 MED ORDER — OLANZAPINE 2.5 MG PO TABS
2.5000 mg | ORAL_TABLET | Freq: Every day | ORAL | Status: DC
  Administered 2024-05-29 (×2): 2.5 mg via ORAL
  Filled 2024-05-29: qty 1

## 2024-05-29 MED ORDER — HALOPERIDOL LACTATE 5 MG/ML IJ SOLN
0.5000 mg | Freq: Four times a day (QID) | INTRAMUSCULAR | Status: AC | PRN
  Administered 2024-05-29 (×4): 0.5 mg via INTRAVENOUS
  Filled 2024-05-29 (×2): qty 1

## 2024-05-29 NOTE — Progress Notes (Addendum)
 PROGRESS NOTE [name]    DOB: 11/09/1920, 89 y.o.  FMW:[id number]    Code Status: Full Code   DOA: 05/28/2024   LOS: 0  Brief hospital course  Lori Pacheco is a 89 y.o. female with medical history significant of essential hypertension and dementia who presents to the hospital with altered mental status. Patient went to the family doctor's office yesterday, was started on donepezil, she only took 1 dose, that she started have confusion.  She was also nauseated, vomited 1 time.  She has not been eating today. At baseline she lives with her daughters, she walks independently, sometimes with a walker, she has some baseline confusion, does not know the date.   Upon arrival in the hospital, she has a mild leukopenia and thrombocytopenia.  Creatinine 1.37, appeared to baseline.  Patient received a 500 mL of fluid bolus.  Metabolic workup has overall been unremarkable. She remains sleepy this morning on exam but alert to voice. Her daughter-in-law at baseline admits that she sleeps more during the day and is awake throughout the nights at baseline. Patient also got olanzapine , seroquel  overnight which could be contributing to her continued sedation. She just worked with PT and walked to bathroom with walker prior to my arrival. They did not recommend further therapy. OT eval recommended HH.   05/29/24 -would like to see her awake enough to eat and ambulate. Please try to hold sedating medications as much as possible so we can ensure that medication side effects are the only thing contributing to her sedation or not. CT head negative for acute.   Assessment & Plan  Principal Problem:   Altered mental status Active Problems:   Essential hypertension   Hypothyroidism   CKD stage 3b, GFR 30-44 ml/min (HCC)  Altered mental status. Dementia with delirium. Patient appeared to have some baseline dementia, started have significant altered mental status after new medicine with donepezil.  Most  likely this is medication effect. This was held to allow for washout but was given seroquel  and olanzapine  overnight contributing to ongoing sedation today.  - has a Comptroller - avoid sedating medications if at all possible, please - PT/OT evaluation- asked to reassess once she has become more alert -  delirium precautions - please allow for reverse sleep/wake cycle activities as this is patient's baseline if able   Leukopenia. Mild thrombocytopenia.- chronic   Chronic kidney disease stage IIIb. Renal function appears to be stable,   Essential hypertension. Continues on home medicines  Body mass index is 20.85 kg/m.  VTE ppx: enoxaparin  (LOVENOX ) injection 30 mg Start: 05/28/24 2200 SCDs Start: 05/28/24 1640  Diet:     Diet   Diet Heart Room service appropriate? Yes; Fluid consistency: Thin   Consultants: None   Subjective 05/29/24    Pt reports no pain. She states she is cold. Otherwise not conversational.    Objective  Blood pressure (!) 150/74, pulse 70, temperature 98.7 F (37.1 C), resp. rate (!) 24, height 5' 3 (1.6 m), weight 53.4 kg, SpO2 100%.  Intake/Output Summary (Last 24 hours) at 05/29/2024 0836 Last data filed at 05/28/2024 1900 Gross per 24 hour  Intake 0 ml  Output --  Net 0 ml   Filed Weights   05/28/24 1111 05/28/24 1635  Weight: 51.7 kg 53.4 kg    Physical Exam:  General: asleep, does not wake to voice- only to gentle shoulder nudging, NAD HEENT: atraumatic, clear conjunctiva, anicteric sclera, MMM, hearing grossly normal Respiratory: normal respiratory  effort. Cardiovascular: quick capillary refill Gastrointestinal: soft, NT, ND Nervous: alert to tactile stimulation and otherwise appears asleep  Extremities: moves all equally, no edema Skin: dry, intact, normal temperature, normal color. No rashes, lesions or ulcers on exposed skin  Labs   I have personally reviewed the following labs and imaging studies CBC    Component Value Date/Time    WBC 2.9 (L) 05/28/2024 1121   RBC 3.75 (L) 05/28/2024 1121   HGB 11.1 (L) 05/28/2024 1121   HGB 11.4 (L) 11/22/2014 0910   HCT 34.5 (L) 05/28/2024 1121   HCT 35.7 11/22/2014 0910   PLT 141 (L) 05/28/2024 1121   PLT 126 (L) 11/22/2014 0910   MCV 92.0 05/28/2024 1121   MCV 90 11/22/2014 0910   MCH 29.6 05/28/2024 1121   MCHC 32.2 05/28/2024 1121   RDW 13.8 05/28/2024 1121   RDW 12.9 11/22/2014 0910   LYMPHSABS 0.5 (L) 05/28/2024 1121   LYMPHSABS 0.7 (L) 11/22/2014 0910   MONOABS 0.2 05/28/2024 1121   MONOABS 0.2 11/22/2014 0910   EOSABS 0.0 05/28/2024 1121   EOSABS 0.0 11/22/2014 0910   BASOSABS 0.0 05/28/2024 1121   BASOSABS 0.0 11/22/2014 0910      Latest Ref Rng & Units 05/28/2024   11:21 AM 04/09/2024    2:08 PM 09/21/2023    4:01 PM  BMP  Glucose 70 - 99 mg/dL 849  91  894   BUN 8 - 23 mg/dL 21  31  30    Creatinine 0.44 - 1.00 mg/dL 8.62  8.51  8.45   BUN/Creat Ratio 6 - 22 (calc)  21  19   Sodium 135 - 145 mmol/L 138  141  143   Potassium 3.5 - 5.1 mmol/L 4.4  4.1  4.9   Chloride 98 - 111 mmol/L 105  104  107   CO2 22 - 32 mmol/L 24  28  31    Calcium 8.9 - 10.3 mg/dL 9.1  9.6  9.2     CT Head Wo Contrast Result Date: 05/28/2024 CLINICAL DATA:  Delirium EXAM: CT HEAD WITHOUT CONTRAST TECHNIQUE: Contiguous axial images were obtained from the base of the skull through the vertex without intravenous contrast. RADIATION DOSE REDUCTION: This exam was performed according to the departmental dose-optimization program which includes automated exposure control, adjustment of the mA and/or kV according to patient size and/or use of iterative reconstruction technique. COMPARISON:  CT head 03/23/2022 FINDINGS: Brain: Cerebral ventricle sizes are concordant with the degree of cerebral volume loss. Patchy and confluent areas of decreased attenuation are noted throughout the deep and periventricular white matter of the cerebral hemispheres bilaterally, compatible with chronic  microvascular ischemic disease. No evidence of large-territorial acute infarction. No parenchymal hemorrhage. No mass lesion. No extra-axial collection. No mass effect or midline shift. No hydrocephalus. Basilar cisterns are patent. Vascular: No hyperdense vessel. Atherosclerotic calcifications are present within the cavernous internal carotid arteries. Skull: No acute fracture or focal lesion. Left greater than right temporomandibular joint degenerative changes. Sinuses/Orbits: Paranasal sinuses and mastoid air cells are clear. The orbits are unremarkable. Other: None. IMPRESSION: No acute intracranial abnormality. Electronically Signed   By: Morgane  Naveau M.D.   On: 05/28/2024 14:53   DG Chest Port 1 View Result Date: 05/28/2024 CLINICAL DATA:  Altered mental status. EXAM: PORTABLE CHEST 1 VIEW COMPARISON:  November 10, 2023. FINDINGS: Stable cardiomediastinal silhouette. Right lung is clear. Minimal left basilar subsegmental atelectasis or scarring is noted. Bony thorax is unremarkable. IMPRESSION: Minimal left basilar subsegmental  atelectasis or scarring. Electronically Signed   By: Lynwood Landy Raddle M.D.   On: 05/28/2024 12:14    Disposition Plan & Communication  Patient status: Observation  Admitted From: Home Planned disposition location: Home Anticipated discharge date: 8/14 pending clinical improvement   Family Communication: daughter in law at bedside     Author: Marien LITTIE Piety, DO [facility] 05/29/2024, 8:36 AM   Available by Epic secure chat 7AM-7PM. If 7PM-7AM, please contact night-coverage.  [facility] contact information found on [email or web address].

## 2024-05-29 NOTE — Evaluation (Signed)
 Physical Therapy Evaluation Patient Details Name: Lori Pacheco MRN: [id number] DOB: 1921-05-18 Today's Date: 05/29/2024  History of Present Illness  Lori Pacheco is a 89 y.o. female with medical history significant of essential hypertension and dementia who presents to the hospital with altered mental status.  Clinical Impression  Patient received in bed sleeping. She rouses to her name and touch. Required max assist for bed mobility. Patient has difficulty following instructions. Sitter at bedside. She is able to stand with Max +2 hand held assist and ambulated to bathroom and back to bed. Very poor balance, poor ability to follow direction for safe mobility. Attempted to use walker, patient was unable to use effectively. She will continue to benefit from skilled PT follow up to determine discharge needs.         If plan is discharge home, recommend the following: A lot of help with walking and/or transfers;A lot of help with bathing/dressing/bathroom;Direct supervision/assist for medications management;Help with stairs or ramp for entrance;Assist for transportation;Direct supervision/assist for financial management   Can travel by private vehicle    no    Equipment Recommendations None recommended by PT  Recommendations for Other Services       Functional Status Assessment Patient has had a recent decline in their functional status and/or demonstrates limited ability to make significant improvements in function in a reasonable and predictable amount of time     Precautions / Restrictions Precautions Precautions: Fall Recall of Precautions/Restrictions: Impaired Restrictions Weight Bearing Restrictions Per Provider Order: No      Mobility  Bed Mobility Overal bed mobility: Needs Assistance Bed Mobility: Supine to Sit, Sit to Supine     Supine to sit: Mod assist Sit to supine: Min assist   General bed mobility comments: decreased initiation, confusion     Transfers Overall transfer level: Needs assistance Equipment used: 2 person hand held assist Transfers: Sit to/from Stand Sit to Stand: Mod assist                Ambulation/Gait Ambulation/Gait assistance: Mod assist, +2 physical assistance Gait Distance (Feet): 20 Feet Assistive device: 2 person hand held assist Gait Pattern/deviations: Step-to pattern, Decreased step length - right, Decreased step length - left, Decreased stride length Gait velocity: decr     General Gait Details: patient grabbing hold of door frames, furniture while ambulating to bathroom. Requires max cues and assist for mobility, poor balance. Attempted to use walker on way back from bathroom, however patient is unable to use properly and unable to follow direction well.  Stairs            Wheelchair Mobility     Tilt Bed    Modified Rankin (Stroke Patients Only)       Balance Overall balance assessment: Needs assistance Sitting-balance support: Feet supported Sitting balance-Leahy Scale: Fair     Standing balance support: Bilateral upper extremity supported, During functional activity Standing balance-Leahy Scale: Poor Standing balance comment: high fall risk                             Pertinent Vitals/Pain Pain Assessment Pain Assessment: PAINAD Breathing: normal Negative Vocalization: none Facial Expression: smiling or inexpressive Body Language: relaxed Consolability: no need to console PAINAD Score: 0    Home Living Family/patient expects to be discharged to:: Private residence Living Arrangements: Children Available Help at Discharge: Family;Available 24 hours/day  Home Equipment: Agricultural consultant (2 wheels)      Prior Function Prior Level of Function : Needs assist  Cognitive Assist : Mobility (cognitive);ADLs (cognitive)           Mobility Comments: daughter in law reports patient needs assistance at baseline, has walker, but  cannot remember to use. ADLs Comments: requires some assistance at baseline     Extremity/Trunk Assessment   Upper Extremity Assessment Upper Extremity Assessment: Defer to OT evaluation    Lower Extremity Assessment Lower Extremity Assessment: Generalized weakness    Cervical / Trunk Assessment Cervical / Trunk Assessment: Normal  Communication   Communication Communication: Impaired Factors Affecting Communication: Reduced clarity of speech;Difficulty expressing self    Cognition Arousal: Alert Behavior During Therapy: Impulsive   PT - Cognitive impairments: History of cognitive impairments, Problem solving, Safety/Judgement, Memory, Awareness, Orientation, Attention, Initiation, Sequencing   Orientation impairments: Place, Time, Situation                     Following commands: Impaired Following commands impaired: Follows one step commands inconsistently     Cueing Cueing Techniques: Verbal cues, Gestural cues, Tactile cues     General Comments      Exercises     Assessment/Plan    PT Assessment Patient needs continued PT services  PT Problem List Decreased strength;Decreased activity tolerance;Decreased balance;Decreased mobility;Decreased coordination;Decreased cognition;Decreased knowledge of use of DME;Decreased safety awareness       PT Treatment Interventions DME instruction;Gait training;Stair training;Functional mobility training;Therapeutic activities;Therapeutic exercise;Balance training;Neuromuscular re-education;Cognitive remediation;Patient/family education    PT Goals (Current goals can be found in the Care Plan section)  Acute Rehab PT Goals PT Goal Formulation: Patient unable to participate in goal setting Time For Goal Achievement: 06/12/24 Potential to Achieve Goals: Fair    Frequency Min 2X/week     Co-evaluation PT/OT/SLP Co-Evaluation/Treatment: Yes Reason for Co-Treatment: Necessary to address cognition/behavior during  functional activity;For patient/therapist safety;To address functional/ADL transfers PT goals addressed during session: Mobility/safety with mobility;Balance;Proper use of DME         AM-PAC PT 6 Clicks Mobility  Outcome Measure Help needed turning from your back to your side while in a flat bed without using bedrails?: Total Help needed moving from lying on your back to sitting on the side of a flat bed without using bedrails?: Total Help needed moving to and from a bed to a chair (including a wheelchair)?: A Lot Help needed standing up from a chair using your arms (e.g., wheelchair or bedside chair)?: A Lot Help needed to walk in hospital room?: A Lot Help needed climbing 3-5 steps with a railing? : Total 6 Click Score: 9    End of Session   Activity Tolerance: Other (comment);Patient limited by lethargy (confusion) Patient left: in bed;with call bell/phone within reach;with nursing/sitter in room Nurse Communication: Mobility status PT Visit Diagnosis: Other abnormalities of gait and mobility (R26.89);Unsteadiness on feet (R26.81);Difficulty in walking, not elsewhere classified (R26.2)    Time: 9091-9082 PT Time Calculation (min) (ACUTE ONLY): 9 min   Charges:   PT Evaluation $PT Eval Moderate Complexity: 1 Mod   PT General Charges $$ ACUTE PT VISIT: 1 Visit         Alek Poncedeleon, PT, GCS 05/29/24,10:42 AM

## 2024-05-29 NOTE — Progress Notes (Signed)
 Patient is alert and oriented X 1, very confused and anxious. She tried to get out of bed multiple time and try to pull out her I/v. She is not following any commands and demonstrated poor safety awareness.gave haldol  as order. 1:1 sitter at bedside. Plan of care ongoing.

## 2024-05-29 NOTE — Care Management Obs Status (Signed)
 MEDICARE OBSERVATION STATUS NOTIFICATION   Patient Details  Name: Lori Pacheco MRN: [id number] Date of Birth: 06/20/1921   Medicare Observation Status Notification Given:  No (did not want a copy)    Rojelio [name] 05/29/2024, 2:19 PM

## 2024-05-29 NOTE — Evaluation (Signed)
 Occupational Therapy Evaluation Patient Details Name: Lori Pacheco MRN: [id number] DOB: 1921-08-14 Today's Date: 05/29/2024   History of Present Illness   Lori Pacheco is a 89 y.o. female with medical history significant of essential hypertension and dementia who presents to the hospital with altered mental status.     Clinical Impressions Patient presenting with decreased Ind in self care,balance, functional mobility/transfers, endurance, and safety awareness. Patient is oriented to self only this session. Pt's daughter in law arrives to room at end of session and reports pt living with her daughter and needing occasional assistance for safety. Pt with cognitive deficits at baseline and she describes a sundowning situation at home where patient becomes very active at night. Pt needing min -mod A of 2 to ambulate with RW to bathroom for safety, initiation, and sequencing of tasks. Pt returning back to bed at end of session with call bell and all needed items within reach. Patient will benefit from acute OT to increase overall independence in the areas of ADLs, functional mobility, and safety awareness in order to safely discharge.      If plan is discharge home, recommend the following:   Two people to help with walking and/or transfers;Two people to help with bathing/dressing/bathroom;Assistance with cooking/housework;Direct supervision/assist for medications management;Direct supervision/assist for financial management;Help with stairs or ramp for entrance;Assist for transportation;Supervision due to cognitive status     Functional Status Assessment   Patient has had a recent decline in their functional status and demonstrates the ability to make significant improvements in function in a reasonable and predictable amount of time.     Equipment Recommendations   BSC/3in1      Precautions/Restrictions   Precautions Precautions: Fall Recall of Precautions/Restrictions:  Impaired Restrictions Weight Bearing Restrictions Per Provider Order: No     Mobility Bed Mobility Overal bed mobility: Needs Assistance Bed Mobility: Supine to Sit, Sit to Supine     Supine to sit: Mod assist Sit to supine: Min assist        Transfers Overall transfer level: Needs assistance Equipment used: 2 person hand held assist Transfers: Sit to/from Stand Sit to Stand: Mod assist                  Balance Overall balance assessment: Needs assistance Sitting-balance support: Feet supported Sitting balance-Leahy Scale: Fair     Standing balance support: Bilateral upper extremity supported, During functional activity Standing balance-Leahy Scale: Poor Standing balance comment: high fall risk                           ADL either performed or assessed with clinical judgement   ADL Overall ADL's : Needs assistance/impaired                         Toilet Transfer: Rolling walker (2 wheels);+2 for physical assistance;+2 for safety/equipment;Minimal assistance                   Vision Patient Visual Report: No change from baseline              Pertinent Vitals/Pain Pain Assessment Pain Assessment: No/denies pain     Extremity/Trunk Assessment Upper Extremity Assessment Upper Extremity Assessment: Generalized weakness   Lower Extremity Assessment Lower Extremity Assessment: Generalized weakness       Communication Communication Communication: Impaired Factors Affecting Communication: Reduced clarity of speech;Difficulty expressing self   Cognition Arousal: Alert Behavior During Therapy:  Impulsive Cognition: History of cognitive impairments                               Following commands: Impaired Following commands impaired: Follows one step commands inconsistently     Cueing  General Comments   Cueing Techniques: Verbal cues;Gestural cues;Tactile cues              Home Living  Family/patient expects to be discharged to:: Private residence Living Arrangements: Children Available Help at Discharge: Family;Available 24 hours/day Type of Home: House Home Access: Stairs to enter [employer] of Steps: 1   Home Layout: One level               Home Equipment: Agricultural consultant (2 wheels)          Prior Functioning/Environment Prior Level of Function : Needs assist  Cognitive Assist : Mobility (cognitive);ADLs (cognitive)           Mobility Comments: daughter in law reports patient needs assistance at baseline, has walker, but cannot remember to use. ADLs Comments: DIL reports pt tries to be very independent but family does assist as needed at baseline.    OT Problem List: Decreased strength;Decreased cognition;Decreased safety awareness;Decreased activity tolerance;Decreased knowledge of use of DME or AE;Impaired balance (sitting and/or standing);Cardiopulmonary status limiting activity   OT Treatment/Interventions: Self-care/ADL training;Therapeutic activities;Therapeutic exercise;Energy conservation;Patient/family education;DME and/or AE instruction;Balance training      OT Goals(Current goals can be found in the care plan section)   Acute Rehab OT Goals Patient Stated Goal: to use bathroom OT Goal Formulation: With patient Time For Goal Achievement: 06/12/24 Potential to Achieve Goals: Fair ADL Goals Pt Will Perform Grooming: with supervision;standing Pt Will Perform Lower Body Dressing: with supervision;sit to/from stand Pt Will Transfer to Toilet: with supervision;bedside commode;ambulating Pt Will Perform Toileting - Clothing Manipulation and hygiene: with supervision;sit to/from stand   OT Frequency:  Min 1X/week    Co-evaluation   Reason for Co-Treatment: Necessary to address cognition/behavior during functional activity;For patient/therapist safety;To address functional/ADL transfers PT goals addressed during session:  Mobility/safety with mobility;Balance;Proper use of DME        AM-PAC OT 6 Clicks Daily Activity     Outcome Measure Help from another person eating meals?: A Lot Help from another person taking care of personal grooming?: A Lot Help from another person toileting, which includes using toliet, bedpan, or urinal?: A Lot Help from another person bathing (including washing, rinsing, drying)?: A Lot Help from another person to put on and taking off regular upper body clothing?: A Lot Help from another person to put on and taking off regular lower body clothing?: A Lot 6 Click Score: 12   End of Session Equipment Utilized During Treatment: Rolling walker (2 wheels) Nurse Communication: Mobility status  Activity Tolerance: Patient limited by fatigue Patient left: in bed;with call bell/phone within reach;with bed alarm set;with nursing/sitter in room  OT Visit Diagnosis: Unsteadiness on feet (R26.81);Repeated falls (R29.6);Muscle weakness (generalized) (M62.81)                Time: 9091-9081 OT Time Calculation (min): 10 min Charges:  OT General Charges $OT Visit: 1 Visit OT Evaluation $OT Eval Moderate Complexity: 1 [address], [place], OTR/L , CBIS ascom [phone]  05/29/24, 2:58 PM

## 2024-05-29 NOTE — Plan of Care (Signed)
 Pt has been very difficult to manage even with sitter and daughter present.  Keeps getting out of bed and instead of letting us  get her on the [facility] she fights against and voids all over the floor.  Pt had zyprexa  at end of day shift and small dose of seroquel  which hasn't made any difference.  Reached out to on call MD for something to make her less agitated.

## 2024-05-30 DIAGNOSIS — R400 Somnolence: Secondary | ICD-10-CM | POA: Diagnosis not present

## 2024-05-30 LAB — CBC
HCT: 39.7 % (ref 36.0–46.0)
Hemoglobin: 12.9 g/dL (ref 12.0–15.0)
MCH: 29.6 pg (ref 26.0–34.0)
MCHC: 32.5 g/dL (ref 30.0–36.0)
MCV: 91.1 fL (ref 80.0–100.0)
Platelets: 183 K/uL (ref 150–400)
RBC: 4.36 MIL/uL (ref 3.87–5.11)
RDW: 13.7 % (ref 11.5–15.5)
WBC: 2.8 K/uL — ABNORMAL LOW (ref 4.0–10.5)
nRBC: 0 % (ref 0.0–0.2)

## 2024-05-30 LAB — BASIC METABOLIC PANEL WITH GFR
Anion gap: 9 (ref 5–15)
BUN: 19 mg/dL (ref 8–23)
CO2: 28 mmol/L (ref 22–32)
Calcium: 9.5 mg/dL (ref 8.9–10.3)
Chloride: 103 mmol/L (ref 98–111)
Creatinine, Ser: 1.24 mg/dL — ABNORMAL HIGH (ref 0.44–1.00)
GFR, Estimated: 39 mL/min — ABNORMAL LOW (ref >60)
Glucose, Bld: 105 mg/dL — ABNORMAL HIGH (ref 70–99)
Potassium: 4.6 mmol/L (ref 3.5–5.1)
Sodium: 140 mmol/L (ref 135–145)

## 2024-05-30 LAB — URINE CULTURE

## 2024-05-30 NOTE — Discharge Summary (Signed)
 Physician Discharge Summary  Patient: Lori Pacheco FMW:[id number] DOB: Dec 09, 1920   Code Status: Limited: Do not attempt resuscitation (DNR) -DNR-LIMITED -Do Not Intubate/DNI  Admit date: 05/28/2024 Discharge date: 05/30/2024 Disposition: Home health, PT and OT PCP: Sowles, Krichna, MD  Recommendations for Outpatient Follow-up:  Follow up with PCP within 1-2 weeks Regarding general hospital follow up and preventative care Recommend CBC leukopenia present inpatient. No evidence of infection.   Discharge Diagnoses:  Principal Problem:   Altered mental status Active Problems:   Essential hypertension   Hypothyroidism   CKD stage 3b, GFR 30-44 ml/min [facility])  Brief Hospital Course Summary: Lori Pacheco is a 89 y.o. female with medical history significant of essential hypertension and dementia who presents to the hospital with altered mental status. Was started on donepezil yesterday, she only took 1 dose, that she started have confusion.   Upon arrival in the hospital, she has a mild leukopenia and thrombocytopenia.  Creatinine 1.37, appeared to baseline.   Head imaging is negative for acute changes.  Patient received a 500 mL of fluid bolus.   Metabolic workup has overall been unremarkable. She remained lethargic on second day of admission due to sedation given overnight.  She was evaluated by PT/OT who recommended HH.  She was returned to her chronic home medications and discontinued donepezil.  She was alert and eating like normal on day of dc without complaints. Sent home with family in good condition.   All other chronic conditions were treated with home medications.    Discharge Condition: Good, improved Recommended discharge diet: Regular healthy diet  Consultations: None   Procedures/Studies: None   Allergies as of 05/30/2024       Reactions   Lyrica  [pregabalin ]     I felt groggy        Medication List     TAKE these medications    acetaminophen   500 MG tablet Commonly known as: TYLENOL  Take 1 tablet (500 mg total) by mouth every 6 (six) hours as needed.   amLODipine  10 MG tablet Commonly known as: NORVASC  Take 1 tablet (10 mg total) by mouth daily.   Blood Glucose-Wrist BP Monitor Devi 1 each by Does not apply route daily.   levothyroxine  100 MCG tablet Commonly known as: SYNTHROID  1 tablet in the morning on an empty stomach Orally Once a day for 90 days   lidocaine  5 % Commonly known as: Lidoderm  Place 1 patch onto the skin every 12 (twelve) hours. (remove after 12 hours)   lisinopril  20 MG tablet Commonly known as: ZESTRIL  Take 1 tablet (20 mg total) by mouth daily.   OLANZapine  2.5 MG tablet Commonly known as: ZyPREXA  Take 1-2 tablets (2.5-5 mg total) by mouth as directed. 1 in am and 2 in pm   polyethylene glycol powder 17 GM/SCOOP powder Commonly known as: GLYCOLAX /MIRALAX  Take 17 g by mouth daily with breakfast.        Follow-up Information     Sowles, Krichna, MD Follow up.   Specialty: Family Medicine Why: hospital follow up Contact information: [address] [place] [address] [phone]                 Subjective   Pt reports feeling well today. She denies any concerns.   All questions and concerns were addressed at time of discharge.  Objective  Blood pressure (!) 146/71, pulse 64, temperature 97.8 F (36.6 C), resp. rate 16, height 5' 3 (1.6 m), weight 53.4 kg,  SpO2 100%.   General: Pt is alert, awake, not in acute distress Cardiovascular: RRR, S1/S2 +, no rubs, no gallops Respiratory: CTA bilaterally, no wheezing, no rhonchi Abdominal: Soft, NT, ND, bowel sounds + Extremities: no edema, no cyanosis  The results of significant diagnostics from this hospitalization (including imaging, microbiology, ancillary and laboratory) are listed below for reference.   Imaging studies: CT Head Wo Contrast Result Date: 05/28/2024 CLINICAL DATA:  Delirium EXAM: CT HEAD  WITHOUT CONTRAST TECHNIQUE: Contiguous axial images were obtained from the base of the skull through the vertex without intravenous contrast. RADIATION DOSE REDUCTION: This exam was performed according to the departmental dose-optimization program which includes automated exposure control, adjustment of the mA and/or kV according to patient size and/or use of iterative reconstruction technique. COMPARISON:  CT head 03/23/2022 FINDINGS: Brain: Cerebral ventricle sizes are concordant with the degree of cerebral volume loss. Patchy and confluent areas of decreased attenuation are noted throughout the deep and periventricular white matter of the cerebral hemispheres bilaterally, compatible with chronic microvascular ischemic disease. No evidence of large-territorial acute infarction. No parenchymal hemorrhage. No mass lesion. No extra-axial collection. No mass effect or midline shift. No hydrocephalus. Basilar cisterns are patent. Vascular: No hyperdense vessel. Atherosclerotic calcifications are present within the cavernous internal carotid arteries. Skull: No acute fracture or focal lesion. Left greater than right temporomandibular joint degenerative changes. Sinuses/Orbits: Paranasal sinuses and mastoid air cells are clear. The orbits are unremarkable. Other: None. IMPRESSION: No acute intracranial abnormality. Electronically Signed   By: Morgane  Naveau M.D.   On: 05/28/2024 14:53   DG Chest Port 1 View Result Date: 05/28/2024 CLINICAL DATA:  Altered mental status. EXAM: PORTABLE CHEST 1 VIEW COMPARISON:  November 10, 2023. FINDINGS: Stable cardiomediastinal silhouette. Right lung is clear. Minimal left basilar subsegmental atelectasis or scarring is noted. Bony thorax is unremarkable. IMPRESSION: Minimal left basilar subsegmental atelectasis or scarring. Electronically Signed   By: Lynwood Landy Raddle M.D.   On: 05/28/2024 12:14    Labs: Basic Metabolic Panel: Recent Labs  Lab 05/28/24 1121 05/30/24 1048  NA  138 140  K 4.4 4.6  CL 105 103  CO2 24 28  GLUCOSE 150* 105*  BUN 21 19  CREATININE 1.37* 1.24*  CALCIUM 9.1 9.5   CBC: Recent Labs  Lab 05/28/24 1121 05/29/24 0804 05/30/24 1048  WBC 2.9* 3.1* 2.8*  NEUTROABS 2.1  --   --   HGB 11.1* 11.4* 12.9  HCT 34.5* 35.3* 39.7  MCV 92.0 91.0 91.1  PLT 141* 162 183   Microbiology: Results for orders placed or performed during the hospital encounter of 05/28/24  Urine Culture     Status: Abnormal   Collection Time: 05/28/24 11:22 AM   Specimen: Urine, Clean Catch  Result Value Ref Range Status   Specimen Description   Final    URINE, CLEAN CATCH Performed at [facility], [address] [place], [place] [address]    Special Requests   Final    NONE Performed at [facility], [address], [place], [place] [address]    Culture MULTIPLE SPECIES PRESENT, SUGGEST RECOLLECTION (A)  Final   Report Status 05/30/2024 FINAL  Final  Blood Culture (routine x 2)     Status: None (Preliminary result)   Collection Time: 05/28/24  3:00 PM   Specimen: BLOOD  Result Value Ref Range Status   Specimen Description BLOOD BLOOD RIGHT FOREARM  Final   Special Requests   Final    BOTTLES DRAWN  AEROBIC ONLY Blood Culture results may not be optimal due to an inadequate volume of blood received in culture bottles   Culture   Final    NO GROWTH 2 DAYS Performed at [facility], [address] [place], [place] [address]    Report Status PENDING  Incomplete  Blood Culture (routine x 2)     Status: None (Preliminary result)   Collection Time: 05/28/24  3:00 PM   Specimen: BLOOD  Result Value Ref Range Status   Specimen Description BLOOD RIGHT ANTECUBITAL  Final   Special Requests   Final    BOTTLES DRAWN AEROBIC AND ANAEROBIC Blood Culture adequate volume   Culture   Final    NO GROWTH 2 DAYS Performed at [facility], [address], [place], [place] [address]    Report Status PENDING  Incomplete     Time coordinating discharge: Over 30 minutes  Marien LITTIE Piety, MD  [facility] 05/30/2024, 12:33 PM

## 2024-05-30 NOTE — Discharge Instructions (Addendum)
 Please discontinue the donepezil medication.  Your workup otherwise looked normal so you can go home today and please follow up with your primary doctor within 1-2 weeks

## 2024-05-30 NOTE — TOC Initial Note (Signed)
 Transition of Care [facility]) - Initial/Assessment Note    Patient Details  Name: Lori Pacheco MRN: [id number] Date of Birth: Oct 26, 1920  [facility] CM/SW Contact:    Alfonso Rummer, LCSW Phone Number: 05/30/2024, 12:11 PM  Clinical Narrative:   KEN DELENA Rummer met with pt oldest daughter in room 117. Pt was asleep during visit. Pt daughter reports she lives with family on the same property and she drives pt to all her medical appts. Pt pcp is Glenard Mire, MD. Pt daughter reports she is familiar with [facility] and would prefer to use that agency for home health services. [facility] was contacted and is awaiting pt discharge.                Expected Discharge Plan: Home w Home Health Services Barriers to Discharge: No Barriers Identified   Patient Goals and CMS Choice     Choice offered to / list presented to : Adult Children      Expected Discharge Plan and Services       Living arrangements for the past 2 months: Single Family Home (Pt lives with family)                           HH Arranged: OT HH Agency: [facility] Date HH Agency Contacted: 05/30/24   Representative spoke with at [facility] Agency: Daphne  Prior Living Arrangements/Services Living arrangements for the past 2 months: Single Family Home (Pt lives with family) Lives with:: Adult Children   Do you feel safe going back to the place where you live?: Yes          Current home services: Home PT, Home OT (Pt will receive services with [facility] once discharged.)    Activities of Daily Living   ADL Screening (condition at time of admission) Independently performs ADLs?: No Does the patient have a NEW difficulty with bathing/dressing/toileting/self-feeding that is expected to last >3 days?: No Does the patient have a NEW difficulty with getting in/out of bed, walking, or climbing stairs that is expected to last >3 days?: No Does the patient  have a NEW difficulty with communication that is expected to last >3 days?: No Is the patient deaf or have difficulty hearing?: No Does the patient have difficulty seeing, even when wearing glasses/contacts?: Yes Does the patient have difficulty concentrating, remembering, or making decisions?: Yes  Permission Sought/Granted                  Emotional Assessment              Admission diagnosis:  Somnolence [R40.0] Altered mental status [R41.82] Patient Active Problem List   Diagnosis Date Noted   Altered mental status 05/28/2024   Mild vascular dementia with anxiety (HCC) 11/11/2022   Pancreatic mass 08/10/2022   History of recent fall 05/10/2022   Gastroesophageal reflux disease without esophagitis 05/10/2022   Chronic pain syndrome 05/09/2022   Palliative care patient 05/09/2022   Neuropathic pain of flank, right 05/09/2022   Benign hypertension with chronic kidney disease, stage III (HCC) 05/09/2022   Mild major depression (HCC) 05/09/2022   Stage 3a chronic kidney disease (HCC) 05/09/2022   Pancytopenia (HCC) 12/15/2020   Superior mesenteric artery stenosis (HCC) 03/02/2020   CKD stage 3b, GFR 30-44 ml/min (HCC) 03/02/2020   History of pulmonary embolism 02/07/2020   Chronic abdominal pain 02/07/2020   Depression with anxiety 02/07/2020  Cognitive dysfunction 12/25/2018   Varicose veins of leg with pain, bilateral 12/04/2018   History of DVT (deep vein thrombosis) 10/17/2018   Other constipation 04/23/2018   Leg cramps 04/23/2018   Thrombocytopenia (HCC) 05/15/2017   Secondary renal hyperparathyroidism (HCC) 05/15/2017   Atherosclerosis of abdominal aorta (HCC) 11/10/2016   Perennial allergic rhinitis with seasonal variation 11/10/2016   Vitamin D  deficiency 03/29/2016   Hypothyroidism 09/18/2015   Essential hypertension 05/19/2015   Hyperlipemia 05/19/2015   PCP:  Glenard Mire, MD Pharmacy:   [facility] [address] [place] - [address] [place] [address] Phone: [phone] Fax: [phone]  [facility]) - [place], [place] - [address] [facility] [address] [place] [address] [place] [address] Phone: [phone] Fax: [phone]  [facility] - [place], [place] - [address] [place] [address] Phone: [phone] Fax: [phone]     Social Drivers of Health (SDOH) Social History: SDOH Screenings   Food Insecurity: No Food Insecurity (05/28/2024)  Housing: Low Risk  (05/28/2024)  Transportation Needs: No Transportation Needs (05/28/2024)  Utilities: Not At Risk (05/28/2024)  Alcohol Screen: Low Risk  (05/25/2023)  Depression (PHQ2-9): Low Risk  (04/09/2024)  Financial Resource Strain: Low Risk  (05/25/2023)  Physical Activity: Sufficiently Active (05/25/2023)  Social Connections: Socially Isolated (05/28/2024)  Stress: Stress Concern Present (05/25/2023)  Tobacco Use: Medium Risk (04/09/2024)  Health Literacy: Adequate Health Literacy (05/25/2023)   SDOH Interventions:     Readmission Risk Interventions     No data to display

## 2024-05-30 NOTE — Plan of Care (Signed)
 Patient remains free from any noted signs of acute changes in current physical status/condition.  Continues to required 1:1 sitter for episodes of increased agitation/impulsiveness.  No additional medication interventions required.  Patient to continue to be monitored by hospital staff until discharged.

## 2024-05-31 ENCOUNTER — Telehealth: Payer: Self-pay

## 2024-05-31 NOTE — Telephone Encounter (Signed)
 Copied from CRM #[id number]. Topic: General - Other >> May 31, 2024  2:06 PM Avram MATSU wrote: Reason for CRM: Deitra is calling from [facility] to advise the provider pt PT/OT was delayed to 8/18    ----------------------------------------------------------------------- From previous Reason for Contact - Home Health Verbal Orders: Caller/Agency: Deitra Rushing Number:  Service Requested: Physical Therapy/OT Frequency:  Any new concerns about the patient?

## 2024-06-02 LAB — CULTURE, BLOOD (ROUTINE X 2)
Culture: NO GROWTH
Culture: NO GROWTH
Special Requests: ADEQUATE

## 2024-06-03 ENCOUNTER — Telehealth: Payer: Self-pay

## 2024-06-03 NOTE — Telephone Encounter (Signed)
 Copied from CRM #[id number]. Topic: Clinical - Home Health Verbal Orders >> Jun 03, 2024 10:18 AM Sophia H wrote: Caller/Agency: [facility] - Physical Therapist [facility] Callback Number: [phone] (vmail secured) Service Requested:  Physical Therapy Frequency: Need Verbal orders to move patients start of care to Thursday 08/21. Any new concerns about the patient? No

## 2024-06-03 NOTE — Telephone Encounter (Signed)
 Left detailed vm

## 2024-06-04 ENCOUNTER — Inpatient Hospital Stay: Admitting: Family Medicine

## 2024-06-06 NOTE — Telephone Encounter (Unsigned)
 Copied from CRM [id number]. Topic: Clinical - Home Health Verbal Orders >> Jun 06, 2024 12:02 PM Delon DASEN wrote: Caller/Agency: Stacie with [facility] Callback Number: [phone] Service Requested: Physical Therapy and Occupational Therapy Frequency: n/a Any new concerns about the patient? Yes- family refused services

## 2024-06-27 DIAGNOSIS — D72819 Decreased white blood cell count, unspecified: Secondary | ICD-10-CM | POA: Diagnosis not present

## 2024-06-27 DIAGNOSIS — Z1339 Encounter for screening examination for other mental health and behavioral disorders: Secondary | ICD-10-CM | POA: Diagnosis not present

## 2024-07-02 DIAGNOSIS — E039 Hypothyroidism, unspecified: Secondary | ICD-10-CM | POA: Diagnosis not present

## 2024-07-02 DIAGNOSIS — Z7689 Persons encountering health services in other specified circumstances: Secondary | ICD-10-CM | POA: Diagnosis not present

## 2024-07-12 ENCOUNTER — Ambulatory Visit: Admitting: Family Medicine

## 2024-07-16 ENCOUNTER — Ambulatory Visit: Admitting: Family Medicine

## 2024-09-04 ENCOUNTER — Other Ambulatory Visit: Payer: Self-pay | Admitting: Family Medicine

## 2024-09-04 DIAGNOSIS — N183 Chronic kidney disease, stage 3 unspecified: Secondary | ICD-10-CM

## 2024-09-04 DIAGNOSIS — E039 Hypothyroidism, unspecified: Secondary | ICD-10-CM

## 2024-09-06 NOTE — Telephone Encounter (Signed)
 Requested Prescriptions  Pending Prescriptions Disp Refills   lisinopril  (ZESTRIL ) 20 MG tablet [Pharmacy Med Name: LISINOPRIL  20 MG Oral Tablet] 90 tablet 3    Sig: TAKE 1 TABLET EVERY DAY     Cardiovascular:  ACE Inhibitors Failed - 09/06/2024 12:59 PM      Failed - Cr in normal range and within 180 days    Creat  Date Value Ref Range Status  04/09/2024 1.48 (H) 0.60 - 0.95 mg/dL Final   Creatinine, Ser  Date Value Ref Range Status  05/30/2024 1.24 (H) 0.44 - 1.00 mg/dL Final   Creatinine, Urine  Date Value Ref Range Status  02/21/2018 125 20 - 275 mg/dL Final         Failed - Last BP in normal range    BP Readings from Last 1 Encounters:  05/30/24 (!) 146/71         Passed - K in normal range and within 180 days    Potassium  Date Value Ref Range Status  05/30/2024 4.6 3.5 - 5.1 mmol/L Final  11/22/2014 4.2 3.5 - 5.1 mmol/L Final         Passed - Patient is not pregnant      Passed - Valid encounter within last 6 months    Recent Outpatient Visits           5 months ago Moderate vascular dementia with agitation [facility])   [place] [facility] Glenard Mire, MD   7 months ago Moderate vascular dementia with anxiety [facility])   [place] [facility] Glenard Mire, MD   9 months ago Moderate vascular dementia with anxiety [facility])   [place] [facility] [place], Krichna, MD               levothyroxine  (SYNTHROID ) 88 MCG tablet [Pharmacy Med Name: LEVOTHYROXINE  SODIUM 88 MCG Oral Tablet] 98 tablet 3    Sig: TAKE 1 TABLET (88 MCG TOTAL) BY MOUTH DAILY BEFORE BREAKFAST AND EXTRA 1/2 TABLET ON SUNDAYS     Endocrinology:  Hypothyroid Agents Failed - 09/06/2024 12:59 PM      Failed - TSH in normal range and within 360 days    TSH  Date Value Ref Range Status  04/09/2024 102.94 (H) 0.40 - 4.50 mIU/L Final         Passed - Valid encounter within last 12 months    Recent Outpatient Visits           5 months ago  Moderate vascular dementia with agitation [facility])   [facility] Mire, MD   7 months ago Moderate vascular dementia with anxiety [facility])   [place] [facility], Mire, MD   9 months ago Moderate vascular dementia with anxiety [facility])   [place] [facility], Krichna, MD               amLODipine  (NORVASC ) 10 MG tablet [Pharmacy Med Name: AMLODIPINE  BESYLATE 10 MG Oral Tablet] 90 tablet 3    Sig: TAKE 1 TABLET EVERY DAY     Cardiovascular: Calcium Channel Blockers 2 Failed - 09/06/2024 12:59 PM      Failed - Last BP in normal range    BP Readings from Last 1 Encounters:  05/30/24 (!) 146/71         Passed - Last Heart Rate in normal range    Pulse Readings from Last 1 Encounters:  05/30/24 64  Passed - Valid encounter within last 6 months    Recent Outpatient Visits           5 months ago Moderate vascular dementia with agitation [facility])   [facility] Glenard Mire, MD   7 months ago Moderate vascular dementia with anxiety [facility])   [place] [facility] Glenard Mire, MD   9 months ago Moderate vascular dementia with anxiety [facility])   [facility] [place], Krichna, MD

## 2024-11-08 ENCOUNTER — Telehealth: Payer: Self-pay

## 2024-11-08 NOTE — Telephone Encounter (Signed)
 Pt is scheduled

## 2024-11-08 NOTE — Telephone Encounter (Signed)
 Copied from CRM #[id number]. Topic: General - Other >> Nov 07, 2024  2:12 PM Amber H wrote: Reason for CRM: Arrie (patients daughter) wanted to if Dr. Glenard was still wanting to see the patient because she was told 6 months ago that provider would no longer be able to see her anymore. She received a call from office about schd her AWV but wanted to wait and see what Dr. Glenard says

## 2024-11-19 ENCOUNTER — Ambulatory Visit: Admitting: Family Medicine
